# Patient Record
Sex: Male | Born: 1940 | Race: White | Hispanic: No | Marital: Married | Smoking: Former smoker
Health system: Southern US, Community
[De-identification: ages and names within clinical notes are randomized; demographics above are authoritative.]

## PROBLEM LIST (undated history)

## (undated) DIAGNOSIS — R112 Nausea with vomiting, unspecified: Secondary | ICD-10-CM

## (undated) DIAGNOSIS — M5136 Other intervertebral disc degeneration, lumbar region: Secondary | ICD-10-CM

## (undated) DIAGNOSIS — Z87448 Personal history of other diseases of urinary system: Secondary | ICD-10-CM

## (undated) DIAGNOSIS — R29898 Other symptoms and signs involving the musculoskeletal system: Secondary | ICD-10-CM

## (undated) DIAGNOSIS — Z951 Presence of aortocoronary bypass graft: Secondary | ICD-10-CM

## (undated) DIAGNOSIS — M5116 Intervertebral disc disorders with radiculopathy, lumbar region: Secondary | ICD-10-CM

## (undated) DIAGNOSIS — K30 Functional dyspepsia: Secondary | ICD-10-CM

## (undated) DIAGNOSIS — K259 Gastric ulcer, unspecified as acute or chronic, without hemorrhage or perforation: Secondary | ICD-10-CM

## (undated) DIAGNOSIS — J449 Chronic obstructive pulmonary disease, unspecified: Secondary | ICD-10-CM

## (undated) DIAGNOSIS — N400 Enlarged prostate without lower urinary tract symptoms: Secondary | ICD-10-CM

## (undated) DIAGNOSIS — E039 Hypothyroidism, unspecified: Secondary | ICD-10-CM

## (undated) DIAGNOSIS — R7303 Prediabetes: Secondary | ICD-10-CM

## (undated) DIAGNOSIS — N3281 Overactive bladder: Secondary | ICD-10-CM

## (undated) DIAGNOSIS — K089 Disorder of teeth and supporting structures, unspecified: Secondary | ICD-10-CM

## (undated) DIAGNOSIS — I2510 Atherosclerotic heart disease of native coronary artery without angina pectoris: Secondary | ICD-10-CM

## (undated) DIAGNOSIS — G373 Acute transverse myelitis in demyelinating disease of central nervous system: Secondary | ICD-10-CM

## (undated) DIAGNOSIS — E785 Hyperlipidemia, unspecified: Secondary | ICD-10-CM

## (undated) DIAGNOSIS — C801 Malignant (primary) neoplasm, unspecified: Secondary | ICD-10-CM

## (undated) DIAGNOSIS — M5416 Radiculopathy, lumbar region: Secondary | ICD-10-CM

## (undated) DIAGNOSIS — L219 Seborrheic dermatitis, unspecified: Secondary | ICD-10-CM

## (undated) DIAGNOSIS — I10 Essential (primary) hypertension: Secondary | ICD-10-CM

## (undated) DIAGNOSIS — Z9889 Other specified postprocedural states: Secondary | ICD-10-CM

## (undated) DIAGNOSIS — F419 Anxiety disorder, unspecified: Secondary | ICD-10-CM

## (undated) DIAGNOSIS — R339 Retention of urine, unspecified: Secondary | ICD-10-CM

## (undated) DIAGNOSIS — N319 Neuromuscular dysfunction of bladder, unspecified: Secondary | ICD-10-CM

## (undated) DIAGNOSIS — I509 Heart failure, unspecified: Secondary | ICD-10-CM

## (undated) HISTORY — DX: Benign prostatic hyperplasia without lower urinary tract symptoms: N40.0

## (undated) HISTORY — DX: Intervertebral disc disorders with radiculopathy, lumbar region: M51.16

## (undated) HISTORY — DX: Presence of aortocoronary bypass graft: Z95.1

## (undated) HISTORY — DX: Essential (primary) hypertension: I10

## (undated) HISTORY — DX: Atherosclerotic heart disease of native coronary artery without angina pectoris: I25.10

## (undated) HISTORY — DX: Neuromuscular dysfunction of bladder, unspecified: N31.9

## (undated) HISTORY — PX: CARDIAC CATHETERIZATION: SHX172

## (undated) HISTORY — DX: Prediabetes: R73.03

## (undated) HISTORY — DX: Chronic obstructive pulmonary disease, unspecified: J44.9

## (undated) HISTORY — PX: HERNIA REPAIR: SHX51

## (undated) HISTORY — PX: EYE SURGERY: SHX253

## (undated) HISTORY — DX: Hypothyroidism, unspecified: E03.9

## (undated) HISTORY — DX: Other symptoms and signs involving the musculoskeletal system: R29.898

## (undated) HISTORY — DX: Retention of urine, unspecified: R33.9

## (undated) HISTORY — DX: Heart failure, unspecified: I50.9

## (undated) HISTORY — DX: Functional dyspepsia: K30

## (undated) HISTORY — DX: Gastric ulcer, unspecified as acute or chronic, without hemorrhage or perforation: K25.9

## (undated) HISTORY — DX: Other intervertebral disc degeneration, lumbar region: M51.36

## (undated) HISTORY — DX: Acute transverse myelitis in demyelinating disease of central nervous system: G37.3

## (undated) HISTORY — DX: Overactive bladder: N32.81

## (undated) HISTORY — DX: Hyperlipidemia, unspecified: E78.5

## (undated) HISTORY — PX: HAND SURGERY: SHX662

## (undated) HISTORY — DX: Seborrheic dermatitis, unspecified: L21.9

---

## 2002-04-07 DIAGNOSIS — Z951 Presence of aortocoronary bypass graft: Secondary | ICD-10-CM

## 2002-04-07 HISTORY — PX: CORONARY ARTERY BYPASS GRAFT: SHX141

## 2002-04-07 HISTORY — DX: Presence of aortocoronary bypass graft: Z95.1

## 2007-07-02 ENCOUNTER — Ambulatory Visit: Payer: Self-pay | Admitting: Unknown Physician Specialty

## 2009-02-09 ENCOUNTER — Ambulatory Visit: Payer: Self-pay | Admitting: Cardiology

## 2010-05-05 ENCOUNTER — Ambulatory Visit: Payer: Self-pay

## 2010-10-11 ENCOUNTER — Inpatient Hospital Stay: Payer: Self-pay | Admitting: Surgery

## 2011-09-25 ENCOUNTER — Ambulatory Visit: Payer: Self-pay | Admitting: Gastroenterology

## 2011-09-27 LAB — PATHOLOGY REPORT

## 2012-07-02 ENCOUNTER — Other Ambulatory Visit: Payer: Self-pay | Admitting: Gastroenterology

## 2012-11-15 LAB — CBC WITH DIFFERENTIAL/PLATELET
Eosinophil #: 0.1 10*3/uL (ref 0.0–0.7)
HCT: 43.7 % (ref 40.0–52.0)
HGB: 15.3 g/dL (ref 13.0–18.0)
Lymphocyte #: 1.7 10*3/uL (ref 1.0–3.6)
Lymphocyte %: 16.8 %
MCH: 31.2 pg (ref 26.0–34.0)
MCHC: 34.9 g/dL (ref 32.0–36.0)
MCV: 89 fL (ref 80–100)
Monocyte #: 0.8 x10 3/mm (ref 0.2–1.0)
Neutrophil #: 7.4 10*3/uL — ABNORMAL HIGH (ref 1.4–6.5)
Platelet: 166 10*3/uL (ref 150–440)
RBC: 4.89 10*6/uL (ref 4.40–5.90)
RDW: 13.3 % (ref 11.5–14.5)
WBC: 10 10*3/uL (ref 3.8–10.6)

## 2012-11-15 LAB — BASIC METABOLIC PANEL
Anion Gap: 6 — ABNORMAL LOW (ref 7–16)
BUN: 13 mg/dL (ref 7–18)
Calcium, Total: 9 mg/dL (ref 8.5–10.1)
Chloride: 106 mmol/L (ref 98–107)
Co2: 26 mmol/L (ref 21–32)
Creatinine: 0.98 mg/dL (ref 0.60–1.30)
EGFR (African American): >60
Glucose: 122 mg/dL — ABNORMAL HIGH (ref 65–99)
Osmolality: 277 (ref 275–301)
Potassium: 3.6 mmol/L (ref 3.5–5.1)

## 2012-11-16 ENCOUNTER — Ambulatory Visit: Payer: Self-pay | Admitting: Orthopedic Surgery

## 2012-11-16 ENCOUNTER — Inpatient Hospital Stay: Payer: Self-pay | Admitting: Internal Medicine

## 2012-11-16 LAB — BASIC METABOLIC PANEL
Creatinine: 1.03 mg/dL (ref 0.60–1.30)
EGFR (African American): >60
EGFR (Non-African Amer.): >60
Potassium: 3.8 mmol/L (ref 3.5–5.1)
Sodium: 142 mmol/L (ref 136–145)

## 2012-11-18 DIAGNOSIS — I10 Essential (primary) hypertension: Secondary | ICD-10-CM

## 2012-11-18 DIAGNOSIS — N400 Enlarged prostate without lower urinary tract symptoms: Secondary | ICD-10-CM

## 2012-11-18 DIAGNOSIS — E039 Hypothyroidism, unspecified: Secondary | ICD-10-CM | POA: Insufficient documentation

## 2012-11-18 DIAGNOSIS — R339 Retention of urine, unspecified: Secondary | ICD-10-CM

## 2012-11-18 DIAGNOSIS — I2510 Atherosclerotic heart disease of native coronary artery without angina pectoris: Secondary | ICD-10-CM

## 2012-11-18 DIAGNOSIS — R29898 Other symptoms and signs involving the musculoskeletal system: Secondary | ICD-10-CM

## 2012-11-18 HISTORY — DX: Retention of urine, unspecified: R33.9

## 2012-11-18 HISTORY — DX: Other symptoms and signs involving the musculoskeletal system: R29.898

## 2012-11-18 HISTORY — DX: Hypothyroidism, unspecified: E03.9

## 2012-11-18 HISTORY — DX: Essential (primary) hypertension: I10

## 2012-11-18 HISTORY — DX: Benign prostatic hyperplasia without lower urinary tract symptoms: N40.0

## 2012-11-18 HISTORY — DX: Atherosclerotic heart disease of native coronary artery without angina pectoris: I25.10

## 2012-11-21 DIAGNOSIS — G373 Acute transverse myelitis in demyelinating disease of central nervous system: Secondary | ICD-10-CM

## 2012-11-21 HISTORY — DX: Acute transverse myelitis in demyelinating disease of central nervous system: G37.3

## 2013-12-05 ENCOUNTER — Ambulatory Visit: Payer: Self-pay | Admitting: Gastroenterology

## 2013-12-10 LAB — PATHOLOGY REPORT

## 2013-12-19 DIAGNOSIS — N319 Neuromuscular dysfunction of bladder, unspecified: Secondary | ICD-10-CM | POA: Insufficient documentation

## 2013-12-19 HISTORY — DX: Neuromuscular dysfunction of bladder, unspecified: N31.9

## 2014-01-03 DIAGNOSIS — E785 Hyperlipidemia, unspecified: Secondary | ICD-10-CM | POA: Insufficient documentation

## 2014-01-03 DIAGNOSIS — I509 Heart failure, unspecified: Secondary | ICD-10-CM

## 2014-01-03 HISTORY — DX: Hyperlipidemia, unspecified: E78.5

## 2014-01-03 HISTORY — DX: Heart failure, unspecified: I50.9

## 2014-01-12 ENCOUNTER — Ambulatory Visit: Payer: Self-pay | Admitting: Gastroenterology

## 2014-01-19 ENCOUNTER — Ambulatory Visit: Payer: Self-pay | Admitting: Gastroenterology

## 2014-01-20 LAB — PATHOLOGY REPORT

## 2014-03-09 DIAGNOSIS — K219 Gastro-esophageal reflux disease without esophagitis: Secondary | ICD-10-CM | POA: Insufficient documentation

## 2014-03-09 DIAGNOSIS — K259 Gastric ulcer, unspecified as acute or chronic, without hemorrhage or perforation: Secondary | ICD-10-CM

## 2014-03-09 DIAGNOSIS — K30 Functional dyspepsia: Secondary | ICD-10-CM

## 2014-03-09 HISTORY — DX: Functional dyspepsia: K30

## 2014-03-09 HISTORY — DX: Gastric ulcer, unspecified as acute or chronic, without hemorrhage or perforation: K25.9

## 2014-05-25 DIAGNOSIS — R739 Hyperglycemia, unspecified: Secondary | ICD-10-CM | POA: Insufficient documentation

## 2014-05-25 DIAGNOSIS — R7303 Prediabetes: Secondary | ICD-10-CM

## 2014-05-25 HISTORY — DX: Prediabetes: R73.03

## 2014-06-04 DIAGNOSIS — N3281 Overactive bladder: Secondary | ICD-10-CM

## 2014-06-04 HISTORY — DX: Overactive bladder: N32.81

## 2014-06-29 DIAGNOSIS — M5136 Other intervertebral disc degeneration, lumbar region: Secondary | ICD-10-CM

## 2014-06-29 DIAGNOSIS — M5116 Intervertebral disc disorders with radiculopathy, lumbar region: Secondary | ICD-10-CM

## 2014-06-29 DIAGNOSIS — M51369 Other intervertebral disc degeneration, lumbar region without mention of lumbar back pain or lower extremity pain: Secondary | ICD-10-CM

## 2014-06-29 HISTORY — DX: Other intervertebral disc degeneration, lumbar region without mention of lumbar back pain or lower extremity pain: M51.369

## 2014-06-29 HISTORY — DX: Other intervertebral disc degeneration, lumbar region: M51.36

## 2014-06-29 HISTORY — DX: Intervertebral disc disorders with radiculopathy, lumbar region: M51.16

## 2014-07-02 HISTORY — PX: OTHER SURGICAL HISTORY: SHX169

## 2015-01-15 NOTE — Consult Note (Signed)
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   Patient was seen by consultant.   Consult note dictated.   Recommend further assessment or treatment.   Discussed with Attending MD.   Comments: Severe back pain followed by subjective and objective weakness and radicular pain urinary retention requiring catheterization decreased sensation perianal region with mild decrease in rectal tone 4/5 Left tib ant, EHL, gastroc  recommend evaluation by spine surgeon (neurosurgery vs ortho) for further care and possible decompression if warranted  findings and recommendations discussed with attending MD.  Electronic Signatures: Mishael Krysiak, Mali Edward (MD)  (Signed [phone] 13:09)  Authored: Brief Consult Note   Last Updated: 22-Feb-14 13:09 by Kamoni Depree, Mali Edward (MD)

## 2015-01-15 NOTE — Discharge Summary (Signed)
PATIENT NAME:  Joseph Ritter, Joseph Ritter MR#:  [id number] DATE OF BIRTH:  26-Jan-1941  DATE OF ADMISSION:  11/15/2012 DATE OF DISCHARGE:    PRIMARY CARE PHYSICIAN:  Paulita Cradle  FINAL DIAGNOSES: 1.  Severe back pain, herniated disk, urinary retention, rule out cauda equina syndrome.  2.  Hypertension.  3.  Nausea.  4.  Hyperlipidemia.  5.  Hypothyroidism.  6.  Benign prostatic hypertrophy.  MEDICATIONS ON TRANSFER:  Include Dilaudid 0.5 mg IV push every 3 hours as needed for pain, acetaminophen hydrocodone 325/5 one to 2 tablets every 4 hours as needed for pain. I will suspend the patient's aspirin just in case surgical procedure needed. Wellbutrin 100 mg daily, Colace 100 mg b.i.d. as needed for constipation. Lovenox 40 mg subcutaneous injection daily, hydralazine 10 mg IV push q. 4 hours p.r.n. hypertension, Synthroid 0.1 mg daily, morphine 2 to 4 mg IV push q. to 4 hours p.r.n. pain, Zofran 4 mg IV q. 4 hours p.r.n. nausea, vomiting; Senna 1 tablet orally b.i.d. p.r.n. constipation, Zoloft 100 mg p.o. daily, Flomax 0.4 mg daily, Proscar 5 mg daily, Solu-Medrol 40 mg IV push q. 8 hours, Altace 5 mg b.i.d. and that dose was increased and patient will also be put on a sliding scale while on steroids.   HOSPITAL COURSE:  The patient was admitted November 15, 2012.   DATE OF DISCHARGE SUMMARY:  November 16, 2012.  The patient came in with back pain and fall.   HISTORY OF PRESENT ILLNESS:  A 74 year old man who is having lower back pain since Thursday started as pain in both legs, resolved in the afternoon, weakness of the lower extremity. The patient was initially admitted as an observation with low back pain, an MRI was ordered. Over the course of the hospitalization the patient developed urinary retention requiring a Foley catheter.   LABORATORY AND RADIOLOGICAL DATA DURING THE HOSPITAL COURSE: Lumbar spine x-ray: Acute degenerative changes with lower lumbar spine associated with facet joint  changes as well. No evidence of compression fracture. Sedimentation rate 1.  C-reactive protein 3.4. White blood cell count 10, H and H 15.3 and 43.7, platelet count of 166, glucose 122, BUN 13, creatinine 0.98, sodium 138, potassium 3.6, chloride 106, CO2 26.  MRI of the lumbar spine with and without contrast showed at L5-S1 small herniated disk superimposed lateral annular disk bulge produces a mild mass effect on the L5 nerve root, mass effect left S1 nerve root. At L4-L5, mild encroachment on the neural foramina bilaterally. No compression fracture. The patient was seen in consultation by Dr. Mali Smith, orthopedic. He recommended evaluation by spine surgery at a tertiary care center since we do not have a neurosurgeon or spine surgeon here at this facility. The patient had severe back pain, urinary retention, weakness, decreased rectal tone. He recommended transfer for possible cauda equina syndrome. The patient was started on empirically IV steroids by me this morning, February 22. The patient is currently able to straight leg raise and has 1+ reflexes bilateral lower extremity.  THE PATIENT'S VITALS UPON TRANSFER: Temperature 98, pulse ranging between 61 and 104, respirations 20, blood pressure 144/76, pulse ox of 94% at rest. The patient will be transferred to [facility] on the medicine service under Dr. Lucianne Lei, accepting physician. I did speak with Dr. Owens Shark, a spinal specialist from orthopedics, who will see the patient in consultation and render his opinion.  TIME SPENT ON PATIENT CARE TODAY:  55 minutes    ____________________________ Tana Conch. [place],  MD rjw:ce D: 11/16/2012 14:04:28 ET T: 11/16/2012 14:45:38 ET JOB#: [id number]  cc: Tana Conch. Leslye Peer, MD, <Dictator>  Paulita Cradle, MD Marisue Brooklyn MD ELECTRONICALLY SIGNED 11/21/2012 15:40

## 2015-01-15 NOTE — Consult Note (Signed)
PATIENT NAME:  Joseph Ritter, Joseph Ritter MR#:  [id number] DATE OF BIRTH:  [birth year]  DATE OF CONSULTATION:  11/16/2012  CONSULTING PHYSICIAN:  Mali E. Iam Lipson, MD  REASON FOR CONSULTATION: Low back pain with urinary retention.   HISTORY OF PRESENT ILLNESS: This is a 74 year old male who had onset of severe back pain, 2 days prior to consultation, in his home after twisting to get up out of a chair.  The pain continued for the next few hours and then began to radiate into the bilateral lower extremities. When the patient awoke the next morning, he had continued pain in the lower extremities as well as some weakness. He had difficulty ambulating.  It was at that point that the patient was brought to the Emergency Room for further evaluation. He was admitted for pain control and close monitoring. He continued to have difficulty with ambulation even with the assistance of physical therapy. Also, during that time and over the course of the entire day, the patient was unable to void. He had some mild incontinence during ambulation with Physical Therapy.  A bladder scan revealed a completely full and distended bladder.  It was at that point that a Foley catheter was placed. The patient underwent an MRI of his lumbar spine. The following morning, the patient's back pain had improved somewhat, although he still had some difficulty with ambulation.  It was at that point that orthopedic consultation was requested. At the time of consultation, the patient's back pain was rated as only a 3 out of 10, sharp in nature, exacerbated by flexion and extension and relieved by rest. The radicular pain had also subsided somewhat with pain medication. However, he continued to have some subjective weakness. The patient had not had a bowel movement for the last 2 days. The patient denied any fevers or chills.   PAST MEDICAL HISTORY: Hypertension, coronary artery disease, status post CABG, diabetes.   PAST SURGICAL HISTORY: CABG.    MEDICATIONS:  Zofran, Vytorin, ramipril, probiotic, Percocet, Protonix.   ALLERGIES: No known drug allergies.   SOCIAL HISTORY: The patient denies any alcohol or tobacco use.   FAMILY HISTORY: Noncontributory.   REVIEW OF SYSTEMS: No chest pain, no shortness of breath.   PHYSICAL EXAMINATION:  GENERAL:  The patient is well-appearing, well-nourished, in no acute distress.  BACK: Mild tenderness about the paraspinal musculature of the lumbar spine, no midline  tenderness. Decreased range of motion on flexion-extension secondary to pain. No erythema.  EXTREMITIES: Examination of the bilateral lower extremities reveals overlying skin to be intact without erythema. He has full range of motion of the hip, knees and ankles.  No instability of the hip, knee or ankle. He has 5 out of 5 strength in all key muscle groups of the right lower extremity; however, he has 4 out of 5 strength on testing of tibialis anterior, EHL and gastrocs. He has intact sensation in all dermatomes, although slightly diminished on the left side along L5 and S1. He also has some decreased perianal sensation. The patient has good distal pulses, negative calf tenderness.  RECTAL: Mildly decreased rectal tone.   IMAGING: MRI of the lumbar spine was obtained and reviewed which reveals evidence of multilevel disk degeneration with disk prolapse and effacement of the thecal sac at multiple levels.   IMPRESSION: A 74 year old male with multilevel disk degeneration, urinary retention, decreased perianal sensation and mildly decreased rectal tone worrisome for cauda equina syndrome.   PLAN: Due to the radiographic and clinical findings, it  was recommended that the patient be transferred for further evaluation by a spine surgeon at a tertiary care center, with that being [place], [place] or [place]. The patient does have a past CABG performed at [facility]. Recommendation for transfer would be for evaluation of possible emergent  decompression if warranted by the spine surgeon at the accepting institution. These findings and recommendations were discussed with the attending physician, and a transfer will be arranged.   ____________________________ Mali E. Stephen Turnbaugh, MD ces:cb D: 11/17/2012 11:27:41 ET T: 11/17/2012 14:59:09 ET JOB#: [id number]  cc: Mali E. Panayiota Larkin, MD, <Dictator> Mali E Sandie Swayze MD ELECTRONICALLY SIGNED 11/17/2012 21:04

## 2015-01-15 NOTE — H&P (Signed)
PATIENT NAME:  Joseph Ritter, Joseph Ritter MR#:  [id number] DATE OF BIRTH:  [birth year]  DATE OF ADMISSION:  11/15/2012  PRIMARY CARE PHYSICIAN: Paulita Cradle, PA-C, at [facility].  CHIEF COMPLAINT: Back pain and fall.  HISTORY OF PRESENT ILLNESS: Mr. Cuff is a 74 year old well-functional male at baseline, who started to experience pain in the lower back for about 3 days. Initially, it started as pain in both legs; however, it was resolved by the afternoon. The following day, started to have somewhat weakness in the lower extremities. Last night around dinnertime, laying down the wheelchair, and turned around and started to have increased pain and swelling. The patient denies having any chronic pain; however, experienced a similar pain about 25 years back. At that time, workup revealed the patient had degenerative joint disease. No surgical intervention was offered at the time. Last night around 10:00 p.m., the patient woke up from the bed, stood up, turned to the right and felt that he could not stand up. In the emergency department, the patient received multiple doses of Dilaudid and diazepam. Attempted to ambulate the patient in the hallways, which was unsuccessful considering the patient's severe generalized weakness in both lower extremities. In the emergency department, x-ray of the lumbar spine was done which was unremarkable.   PAST MEDICAL HISTORY:  1. Hypertension. 2. Coronary artery disease status post CABG, follows up with Dr. Miquel Dunn.  3. Diabetes mellitus, on oral medication.  ALLERGIES: Deny having any known drug allergies.  HOME MEDICATIONS: 1. Zofran 4 mg every 8 hours as needed. 2. Vytorin 10/40 mg once a day. 3.  mg once a day. 4. Ramipril 5 mg daily. 5. Probiotic 1 capsule once a day. 6. Percocet 1 to 2 tablets every 4 hours. 7. Protonix 40 mg daily.  SOCIAL HISTORY: Previous history of smoking and drinking alcohol, quit about 25 years back. Denies using any  illicit drugs.   FAMILY HISTORY: History of hypertension, hyperlipidemia.  REVIEW OF SYSTEMS: CONSTITUTIONAL: No fever.  EYES: Has no change in vision. ENT: Denies having any ringing in the ears. RESPIRATORY: Denies having any cough, shortness of breath. CARDIOVASCULAR: Denies having any chest pain, palpitations. GASTROINTESTINAL: As mentioned above, the patient has been nauseated since the patient has been given pain medications. GENITOURINARY: Denies having any dysuria. NEUROLOGIC: The patient has no weakness or tingling sensation.  PHYSICAL EXAMINATION:  GENERAL: This is a well-built, well-nourished, age-appropriate male lying down in the bed, not in distress.   VITAL SIGNS: Temperature 97.3, pulse 75, blood pressure 180/76, respiratory rate of 20, oxygen saturation is 97% on room air. HEENT: Head: Normocephalic and atraumatic. Eyes: No scleral icterus. Conjunctivae normal. Pupils equal and reactive to light. Mucous membranes are moist.  NECK: Supple. No lymphadenopathy. No JVD. No carotid bruits.  CHEST: Has no focal tenderness.  LUNGS: Bilaterally clear to auscultation. HEART: S1 and S2, regular. No murmurs are heard.  ABDOMEN: Bowel sounds present. Soft, nontender, nondistended. EXTREMITIES: No pedal edema. Pulses 2+.  NEUROLOGIC: The patient is alert and oriented to place, person and time. No apparent cranial nerve abnormalities. Is 5/5 in upper and lower extremities. Reflexes equal on both sides.    ASSESSMENT AND PLAN: Having lower extremity weakness and low back pain.   1. Low back pain. Could not really pinpoint the pain. The patient states somewhat in the lumbar and both sacral areas. Will obtain MRI of the brain. Differential diagnosis: Muscle sprain versus infectious process. Will also obtain CRP and sed rate.  2.  Hypertension, moderately controlled. Blood pressure is 180/76. Will continue the home medications and will give labetalol as needed. 3. Lower extremity weakness.  Will keep the patient at bedrest for now. Will obtain MRI of the brain. Will also obtain physical therapy and occupational therapy. 4. Nausea, most likely secondary to the narcotic medication. Will continue with antinausea medications. 5. Keep the patient on deep venous thrombosis prophylaxis with Lovenox.  TIME SPENT TAKING CARE OF THIS PATIENT: 50 minutes.  ____________________________ Monica Becton, MD pv:OSi D: 11/15/2012 04:42:00 ET T: 11/15/2012 07:44:16 ET JOB#: [id number]  cc: Monica Becton, MD, <Dictator>    Monica Becton MD ELECTRONICALLY SIGNED 11/17/2012 7:16

## 2015-05-03 ENCOUNTER — Ambulatory Visit (INDEPENDENT_AMBULATORY_CARE_PROVIDER_SITE_OTHER): Payer: Medicare Other | Admitting: Urology

## 2015-05-03 VITALS — BP 179/74 | HR 68 | Ht 66.0 in | Wt 180.4 lb

## 2015-05-03 DIAGNOSIS — N400 Enlarged prostate without lower urinary tract symptoms: Secondary | ICD-10-CM

## 2015-05-03 DIAGNOSIS — R339 Retention of urine, unspecified: Secondary | ICD-10-CM | POA: Diagnosis not present

## 2015-05-03 DIAGNOSIS — J449 Chronic obstructive pulmonary disease, unspecified: Secondary | ICD-10-CM

## 2015-05-03 DIAGNOSIS — N319 Neuromuscular dysfunction of bladder, unspecified: Secondary | ICD-10-CM

## 2015-05-03 DIAGNOSIS — F32A Depression, unspecified: Secondary | ICD-10-CM | POA: Insufficient documentation

## 2015-05-03 DIAGNOSIS — L219 Seborrheic dermatitis, unspecified: Secondary | ICD-10-CM | POA: Insufficient documentation

## 2015-05-03 DIAGNOSIS — F329 Major depressive disorder, single episode, unspecified: Secondary | ICD-10-CM | POA: Insufficient documentation

## 2015-05-03 HISTORY — DX: Seborrheic dermatitis, unspecified: L21.9

## 2015-05-03 HISTORY — DX: Chronic obstructive pulmonary disease, unspecified: J44.9

## 2015-05-03 LAB — MICROSCOPIC EXAMINATION

## 2015-05-03 LAB — URINALYSIS, COMPLETE
Bilirubin, UA: NEGATIVE
GLUCOSE, UA: NEGATIVE
Ketones, UA: NEGATIVE
Nitrite, UA: POSITIVE — AB
Protein, UA: NEGATIVE
Specific Gravity, UA: 1.02 (ref 1.005–1.030)
Urobilinogen, Ur: 0.2 mg/dL (ref 0.2–1.0)
pH, UA: 5.5 (ref 5.0–7.5)

## 2015-05-03 NOTE — Assessment & Plan Note (Signed)
He has a neurogenic bladder with incomplete emptying from BPH and Botox.   His urine looks colonized.  Urine culture today. I will get a urine culture to monitor his upper tracts. I have given him samples of vesicare 5mg  po daily #21 to try and reviewed the side effects. He will return in 3 weeks for reevaluation. No treatment for infection unless he becomes symptomatic or decides to have repeat botox.

## 2015-05-03 NOTE — Progress Notes (Signed)
05/03/2015 11:06 AM   Joseph Ritter 12/04/1940 [id number]  Referring provider: [name], MD [place], [place] [address]  Chief Complaint  Patient presents with  . Neurogenic Bladder    New Patient    HPI: [name] is a 74 yo WM with a history of a neurogenic bladder following transverse myelitis about 3 years ago. He is has some urgency and can void small amounts but he uses CIC to complete emptying.  He has had prior botox in October of 2015.  He had a reduction in the urgency initially but it has started to wear off.  He has nocturia x 2 with urgency.   He has frequency q1-2hrs.  He has no dysuria but his urine looks colonized today.  He has no other GU history.  He is on tamsulosin but failed Myrbetriq.  He doesn't think he has been on any anticholinergics.   PMH: Past Medical History  Diagnosis Date  . CCF (congestive cardiac failure) 01/03/2014    Overview:  HX OF   . Arteriosclerosis of coronary artery 11/18/2012    Overview:  PATENT LIMA TO LAD, PATENT SVG TO PDA AND OCCULUDED SVG TO OM2 BY CATHERIZATION 02/09/2009   . BP (high blood pressure) 11/18/2012  . CAFL (chronic airflow limitation) 05/03/2015  . Gastric ulcer 03/09/2014  . Adult hypothyroidism 11/18/2012  . Borderline diabetes 05/25/2014  . Leg weakness 11/18/2012  . Neuritis or radiculitis due to rupture of lumbar intervertebral disc 06/29/2014  . 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 DDD (degenerative disc disease), lumbar 06/29/2014  . 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 Clinical depression 05/03/2015  . Acid indigestion 03/09/2014  . HLD (hyperlipidemia) 01/03/2014  . Incomplete bladder emptying 11/18/2012  . 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 H/O coronary artery bypass surgery 04/07/2002    Overview:  CABG X3 WITH LIMA TO LAD, SVG OM1 AND PDA     Surgical History: Past Surgical History  Procedure  Laterality Date  . Eye surgery    . Hand surgery    . Coronary artery bypass graft  04/07/2002  . Cysto bladder botox injection  07/02/2014    Home Medications:    Medication List       This list is accurate as of: 05/03/15 11:06 AM.  Always use your most recent med list.               acetaminophen 500 MG tablet  Commonly known as:  TYLENOL  Take by mouth.     aspirin EC 81 MG tablet  Take by mouth.     buPROPion 100 MG 12 hr tablet  Commonly known as:  WELLBUTRIN SR  TAKE ONE TABLET BY MOUTH ONCE A DAY     CALCIUM CITRATE + D 250-200 MG-UNIT Tabs  Generic drug:  Calcium Citrate-Vitamin D  Take by mouth.     CENTRUM SILVER tablet  Take by mouth.     ketoconazole 2 % shampoo  Commonly known as:  NIZORAL  APPLY SHAMPOO TO HAIR AS DIRECTED TWICE A WEEK     levothyroxine 100 MCG tablet  Commonly known as:  SYNTHROID, LEVOTHROID  1 TABLET BY MOUTH EVERY MORNING FOR THYROID     nitroGLYCERIN 0.4 MG SL tablet  Commonly known as:  NITROSTAT  Place under the tongue.     polyethylene glycol powder powder  Commonly known as:  GLYCOLAX/MIRALAX  Take by  mouth.     RABEprazole 20 MG tablet  Commonly known as:  ACIPHEX  Take by mouth.     ramipril 10 MG capsule  Commonly known as:  ALTACE  TAKE ONE CAPSULE BY MOUTH EVERY DAY     senna-docusate 8.6-50 MG per tablet  Commonly known as:  Senokot-S  Take by mouth.     sertraline 100 MG tablet  Commonly known as:  ZOLOFT  TAKE 1 TABLET BY MOUTH ONCE A DAY     sucralfate 1 G tablet  Commonly known as:  CARAFATE  TAKE 1 TABLET BY MOUTH TWICE A DAY     tamsulosin 0.4 MG Caps capsule  Commonly known as:  FLOMAX  Take by mouth.     traMADol 50 MG tablet  Commonly known as:  ULTRAM  1 qid prn     VYTORIN 10-40 MG per tablet  Generic drug:  ezetimibe-simvastatin  TAKE 1 TABLET BY MOUTH EVERY DAY AT BEDTIME        Allergies: No Known Allergies  Family History: Family History  Problem Relation Age of Onset    . Prostate cancer Neg Hx   . Bladder Cancer Neg Hx   . Kidney cancer Neg Hx   . Heart attack Mother   . Heart disease Father     Social History:  reports that he has quit smoking. He does not have any smokeless tobacco history on file. He reports that he does not drink alcohol or use illicit drugs.  ROS: UROLOGY Frequent Urination?: Yes Hard to postpone urination?: Yes Burning/pain with urination?: No Get up at night to urinate?: Yes Leakage of urine?: Yes Urine stream starts and stops?: Yes Trouble starting stream?: Yes Do you have to strain to urinate?: Yes Blood in urine?: No Urinary tract infection?: No Sexually transmitted disease?: No Injury to kidneys or bladder?: No Painful intercourse?: No Weak stream?: Yes Erection problems?: No Penile pain?: No  Gastrointestinal Nausea?: No Vomiting?: No Indigestion/heartburn?: No Diarrhea?: No Constipation?: No  Constitutional Fever: No Night sweats?: No Weight loss?: No Fatigue?: Yes  Skin Skin rash/lesions?: Yes Itching?: No  Eyes Blurred vision?: No Double vision?: No  Ears/Nose/Throat Sore throat?: No Sinus problems?: No  Hematologic/Lymphatic Swollen glands?: No Easy bruising?: No  Cardiovascular Leg swelling?: No Chest pain?: No  Respiratory Cough?: No Shortness of breath?: Yes  Endocrine Excessive thirst?: No  Musculoskeletal Back pain?: Yes Joint pain?: Yes  Neurological Headaches?: No Dizziness?: No  Psychologic Depression?: Yes Anxiety?: Yes  Physical Exam: BP 179/74 mmHg  Pulse 68  Ht 5\' 6"  (1.676 m)  Wt 180 lb 6.4 oz (81.829 kg)  BMI 29.13 kg/m2  Physical Exam  Constitutional: He appears well-developed and well-nourished. No distress.  Cardiovascular: Normal rate, regular rhythm and normal heart sounds.   Pulmonary/Chest: Effort normal and breath sounds normal.  Abdominal: Soft. He exhibits no mass. There is no tenderness.     Laboratory Data: Lab Results   Component Value Date   WBC 10.0 11/15/2012   HGB 15.3 11/15/2012   HCT 43.7 11/15/2012   MCV 89 11/15/2012   PLT 166 11/15/2012    Lab Results  Component Value Date   CREATININE 1.03 11/16/2012    No results found for: PSA  No results found for: TESTOSTERONE  No results found for: HGBA1C  Urinalysis He has >30 WBC, 3-10 RBC and many bacteria.  Pertinent Imaging: None but I have reviewed records from [place].    Assessment & Plan:  Problem List Items Addressed This Visit    Benign fibroma of prostate   Relevant Medications   tamsulosin (FLOMAX) 0.4 MG CAPS capsule   Other Relevant Orders   US Renal Limited   Incomplete bladder emptying    He has a neurogenic bladder with incomplete emptying from BPH and Botox.   His urine looks colonized.  Urine culture today. I will get a urine culture to monitor his upper tracts. I have given him samples of vesicare 5mg  po daily #21 to try and reviewed the side effects. He will return in 3 weeks for reevaluation. No treatment for infection unless he becomes symptomatic or decides to have repeat botox.       Relevant Orders   US Renal Limited   Bladder neurogenesis - Primary    See note with incomplete emptying.      Relevant Orders   Urinalysis, Complete   CULTURE, URINE COMPREHENSIVE   US Renal Limited      Return in about 3 weeks (around 05/24/2015).  Malka So, MD  [facility] [address] [place], [place] [address] [phone]

## 2015-05-03 NOTE — Assessment & Plan Note (Signed)
See note with incomplete emptying.

## 2015-05-05 ENCOUNTER — Other Ambulatory Visit: Payer: Self-pay

## 2015-05-05 DIAGNOSIS — N319 Neuromuscular dysfunction of bladder, unspecified: Secondary | ICD-10-CM

## 2015-05-05 DIAGNOSIS — R339 Retention of urine, unspecified: Secondary | ICD-10-CM

## 2015-05-05 LAB — CULTURE, URINE COMPREHENSIVE

## 2015-05-06 ENCOUNTER — Ambulatory Visit
Admission: RE | Admit: 2015-05-06 | Discharge: 2015-05-06 | Disposition: A | Discharge status: Home / Self Care | Payer: Medicare Other | Source: Ambulatory Visit | Attending: Urology | Admitting: Urology

## 2015-05-06 ENCOUNTER — Telehealth: Payer: Self-pay

## 2015-05-06 ENCOUNTER — Encounter: Payer: Self-pay | Admitting: Urology

## 2015-05-06 DIAGNOSIS — N319 Neuromuscular dysfunction of bladder, unspecified: Secondary | ICD-10-CM | POA: Diagnosis present

## 2015-05-06 NOTE — Telephone Encounter (Signed)
This is the pt that was sent for a urine cx. It is positive. Please advise.

## 2015-05-07 ENCOUNTER — Telehealth: Payer: Self-pay

## 2015-05-07 NOTE — Telephone Encounter (Signed)
Spoke with pt who stated at this time is not having UTI symptoms. Pt also stated he has not decided if he would like to have botox or not. Pt stated he will call office when he makes a decision.

## 2015-05-07 NOTE — Telephone Encounter (Signed)
Spoke with pt wife and made aware of u/s results. Wife voiced understanding.

## 2015-05-07 NOTE — Telephone Encounter (Signed)
-----   Message from Irine Seal, MD sent at 05/06/2015  8:03 PM EDT ----- His culture is positive but he was asymptomatic and does self cath so no treatment needed unless he becomes symptomatic or decides to have botox.

## 2015-05-07 NOTE — Telephone Encounter (Signed)
-----   Message from Hollice Espy, MD sent at 05/06/2015  4:20 PM EDT ----- Please let patient know ultrasound is fine.  We will see him in follow up as scheduled.  Hollice Espy, MD

## 2015-05-17 ENCOUNTER — Ambulatory Visit (INDEPENDENT_AMBULATORY_CARE_PROVIDER_SITE_OTHER): Payer: Medicare Other | Admitting: Urology

## 2015-05-17 VITALS — BP 162/72 | HR 85 | Ht 67.0 in | Wt 182.8 lb

## 2015-05-17 DIAGNOSIS — N312 Flaccid neuropathic bladder, not elsewhere classified: Secondary | ICD-10-CM

## 2015-05-17 DIAGNOSIS — N319 Neuromuscular dysfunction of bladder, unspecified: Secondary | ICD-10-CM

## 2015-05-17 DIAGNOSIS — N400 Enlarged prostate without lower urinary tract symptoms: Secondary | ICD-10-CM

## 2015-05-17 LAB — URINALYSIS, COMPLETE
Bilirubin, UA: NEGATIVE
Glucose, UA: NEGATIVE
Nitrite, UA: POSITIVE — AB
Protein, UA: NEGATIVE
SPEC GRAV UA: 1.02 (ref 1.005–1.030)
Urobilinogen, Ur: 0.2 mg/dL (ref 0.2–1.0)
pH, UA: 6 (ref 5.0–7.5)

## 2015-05-17 LAB — MICROSCOPIC EXAMINATION
EPITHELIAL CELLS (NON RENAL): NONE SEEN /HPF (ref 0–10)
RBC, UA: NONE SEEN /hpf (ref 0–?)

## 2015-05-17 NOTE — Progress Notes (Signed)
[name] is a 74 yo WM with a history of a neurogenic bladder following transverse myelitis about 3 years ago. He was seen recently with complaints of urinary urgency and small volume voids. He uses CIC to completely empty. He has had prior botox in October of 2015. He had a reduction in the urgency initially but it has started to wear off. He has nocturia x 2 with urgency.He has frequency q1-2hrs. He has no other GU history. He is on tamsulosin but failed Myrbetriq. ------------------------------------ Prior GU note from Joseph Ritter: h/o  of BPH, transverse myelitis with neurogenic bladder. He underwent botox injection Sept 2015. He did not feel this made a significant improvement in his symptoms. He does not wish to be scheduled for repeat injections. Voiding 6-8 times per day and 2 times per night. He typically catheterizes after every daytime void. He typically voids 1/4 and catheterizes for 3/4 of the volume. He catheterizes with a 16 french speedi-cath. He has rare incontinence. He does not routinely wear pads. No UTI symptoms.   UDS 12/24/11: This study revealed neurogenic detrusor over-activity due to phasic uninhibited contractions of 17 to 34 cmH2O pressure during filling. Motor urge incontinence was threatened but not demonstrated. The study showed the bladder has normal compliance and normal cystometric capacity of 240-270 mls. The volume was limited due to the bladder over-activity. Stress type incontinence was NOT demonstrated during provocation by coughing or straining. The cystogram showed a normal shaped bladder with no reflux. Voiding was by volition with underlying abdominal straining . His flow was slow and weak but did appear to continue for a short period when not straining. Peak flow was 8 mls/sec at 23 cmH2O pressure and he voided 136 mls with 155-160 mls residual. These pressure/flow measurements are not indicative of outflow obstruction (Grade 0 to 1 out of 6 on a Schafer nomogram).  Fluoroscopy showed the bladder neck and prostatic fossa funneled open during voiding and the distal tract opened adequately. The patient has tried mirabegron and flomax but still voids frequently. He would like to try botox. Today we injected 100 units of botox in 20cc of NS at 20 different sites. He tolerated the procedure well.  06/04/14 Plan: 1. We will see you back in 4 weeks to follow up on your results 2. Fill out a voiding diary prior to follow up appointment.  3. Continue to catheterize yourself twice a day until volumes that you get are low, then can stop 4. We will give you a prescription for catheters to continue CIC  07/02/14 clinic visit and interval history: Patient is status post cystourethroscopy and injection of 100 units of Botox on 06/04/14 . He presents for follow-up. He denies fevers chills nausea vomiting. Patient has had improvement in his urinary urgency and frequency. Based on his diary for this clean intermittent catheterization additional logs the patient is able to go up to 12 hr in between voids. He also noted improvement in his nocturnal voids from 3 voids at night to zero to one void at night The patient does have a residual between 300-400 mL on average but his urine ever studies preoperatively demonstrated a bladder approximately that contractility index of less than 100. He denies any difficulties with catheterization. He does request exchange of his 10 French catheters 60 [place] which he has used previously with better success.  07/02/14 Plan 1. Continue clean intermittent catheterization t.i.d after voids. A new prescription was given to the patient For new 16 French urethral catheters.  The patient was informed to send the 79 French catheters back to the medical supply company -----------------------------------------------------------  Today, he returns after starting Vesicare 5 mg. He continues to have issues with urinary frequency and urgency. He has nocturia. He  voids small amounts and then does CIC. He does CIC 8 - 10 x per day due to urgency. He gets a good amount when he does CIC. Incontinence is minimal. No pad use unless he's going out for an extended period.   Renal bladder u/s showed no hydronephrosis and a normal bladder volume.   Review of systems positive for back pain, depression and anxiety otherwise negative. 14 systems review of systems obtained. He denies alcohol or tobacco use. Denies recreational drug use.  His urinalysis shows many bacteria but clinically is not infected.  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 Pt wants to hold off. Discussed increasing Vesicare vs. Botox. Discussed dry mouth and constipation and he elected trial of Vesicare 10 mg. Cont CIC.  -AB - Urine not cultured as prior culture was done (e coli)

## 2015-06-01 ENCOUNTER — Telehealth: Payer: Self-pay

## 2015-06-01 NOTE — Telephone Encounter (Signed)
Patient will run out of the vesicare samples he was given and needs a script or more samples before his appt on 06-29-15   Please advise  Thanks, Sharyn Lull

## 2015-06-03 NOTE — Telephone Encounter (Signed)
Samples were left at the front.

## 2015-06-03 NOTE — Telephone Encounter (Signed)
No answer

## 2015-06-29 ENCOUNTER — Ambulatory Visit: Payer: Medicare Other

## 2015-07-13 ENCOUNTER — Encounter: Payer: Self-pay | Admitting: Urology

## 2015-07-13 ENCOUNTER — Ambulatory Visit (INDEPENDENT_AMBULATORY_CARE_PROVIDER_SITE_OTHER): Payer: Medicare Other | Admitting: Urology

## 2015-07-13 VITALS — BP 196/84 | HR 76 | Ht 66.0 in | Wt 182.8 lb

## 2015-07-13 DIAGNOSIS — N319 Neuromuscular dysfunction of bladder, unspecified: Secondary | ICD-10-CM

## 2015-07-13 LAB — URINALYSIS, COMPLETE
BILIRUBIN UA: NEGATIVE
GLUCOSE, UA: NEGATIVE
KETONES UA: NEGATIVE
Leukocytes, UA: NEGATIVE
NITRITE UA: NEGATIVE
Protein, UA: NEGATIVE
RBC, UA: NEGATIVE
SPEC GRAV UA: 1.01 (ref 1.005–1.030)
UUROB: 1 mg/dL (ref 0.2–1.0)
pH, UA: 7 (ref 5.0–7.5)

## 2015-07-13 LAB — MICROSCOPIC EXAMINATION
Bacteria, UA: NONE SEEN
Epithelial Cells (non renal): NONE SEEN /hpf (ref 0–10)
RBC, UA: NONE SEEN /hpf (ref 0–?)
WBC UA: NONE SEEN /HPF (ref 0–?)

## 2015-07-13 NOTE — Progress Notes (Signed)
07/13/2015 2:50 PM   Joseph Ritter 15-Feb-1941 [id number]  Referring provider: [name], MD [facility] [place] [place] [address]  Chief Complaint  Patient presents with  . Neurogenic Bladder    HPI: 1 - Neurogenic Bladder with Detrussor Hyperreflexia with Impaired Conractility - neurogenic bladder 2015 after transverse myeliitis. [facility]DS [facility] with small contractions but ineficient emptying, PVR's >47ml. Manages with self cath about QID. Tried ofice BTX 100 IH and oral anticholinergics w/o help for urgency component which is minimal bother. No incontinence.  Recent Surveaillance: 2016 - [place] no hydro, Cr <1.0.   Today "Ray" is seen in f/u above. No interval febrile UTI or incontiennce.    PMH: Past Medical History  Diagnosis Date  . CCF (congestive cardiac failure) ([place]) 01/03/2014    Overview:  HX OF   . Arteriosclerosis of coronary artery 11/18/2012    Overview:  PATENT LIMA TO LAD, PATENT SVG TO PDA AND OCCULUDED SVG TO OM2 BY CATHERIZATION 02/09/2009   . BP (high blood pressure) 11/18/2012  . CAFL (chronic airflow limitation) ([place]) 05/03/2015  . Gastric ulcer 03/09/2014  . Adult hypothyroidism 11/18/2012  . Borderline diabetes 05/25/2014  . Leg weakness 11/18/2012  . Neuritis or radiculitis due to rupture of lumbar intervertebral disc 06/29/2014  . Subacute transverse myelitis ([place]) 11/21/2012  . DDD (degenerative disc disease), lumbar 06/29/2014  . 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 Clinical depression 05/03/2015  . Acid indigestion 03/09/2014  . HLD (hyperlipidemia) 01/03/2014  . Incomplete bladder emptying 11/18/2012  . 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 Eye  surgery    . Hand surgery    . Coronary artery bypass graft  04/07/2002  . Cysto bladder botox injection  07/02/2014    Home Medications:    Medication List       This list is accurate as of: 07/13/15  2:50 PM.  Always use your most recent med list.               acetaminophen 500 MG tablet  Commonly known as:  TYLENOL  Take by mouth.     aspirin EC 81 MG tablet  Take by mouth.     buPROPion 100 MG 12 hr tablet  Commonly known as:  WELLBUTRIN SR  TAKE ONE TABLET BY MOUTH ONCE A DAY     CALCIUM CITRATE + D 250-200 MG-UNIT Tabs  Generic drug:  Calcium Citrate-Vitamin D  Take by mouth.     CENTRUM SILVER tablet  Take by mouth.     cyclobenzaprine 10 MG tablet  Commonly known as:  FLEXERIL  TAKE 1/2 TO 1 TABLET BY MOUTH NIGHTLY     ketoconazole 2 % shampoo  Commonly known as:  NIZORAL  APPLY SHAMPOO TO HAIR AS DIRECTED TWICE A WEEK     levothyroxine 100 MCG tablet  Commonly known as:  SYNTHROID, LEVOTHROID  1 TABLET BY MOUTH EVERY MORNING FOR THYROID     nitroGLYCERIN 0.4 MG SL tablet  Commonly known as:  NITROSTAT  Place under the tongue.     polyethylene glycol powder powder  Commonly known as:  GLYCOLAX/MIRALAX  Take by mouth.     RABEprazole 20  MG tablet  Commonly known as:  ACIPHEX  Take by mouth.     ramipril 10 MG capsule  Commonly known as:  ALTACE  TAKE ONE CAPSULE BY MOUTH EVERY DAY     senna-docusate 8.6-50 MG tablet  Commonly known as:  Senokot-S  Take by mouth.     sertraline 100 MG tablet  Commonly known as:  ZOLOFT  TAKE 1 TABLET BY MOUTH ONCE A DAY     solifenacin 5 MG tablet  Commonly known as:  VESICARE  Take 5 mg by mouth daily.     sucralfate 1 G tablet  Commonly known as:  CARAFATE  TAKE 1 TABLET BY MOUTH TWICE A DAY     tamsulosin 0.4 MG Caps capsule  Commonly known as:  FLOMAX  Take by mouth.     traMADol 50 MG tablet  Commonly known as:  ULTRAM  1 qid prn     VYTORIN 10-40 MG tablet  Generic drug:   ezetimibe-simvastatin  TAKE 1 TABLET BY MOUTH EVERY DAY AT BEDTIME        Allergies: No Known Allergies  Family History: Family History  Problem Relation Age of Onset  . Prostate cancer Neg Hx   . Bladder Cancer Neg Hx   . Kidney cancer Neg Hx   . Heart attack Mother   . Heart disease Father     Social History:  reports that he has quit smoking. He does not have any smokeless tobacco history on file. He reports that he does not drink alcohol or use illicit drugs.  ROS: UROLOGY Frequent Urination?: Yes Hard to postpone urination?: Yes Burning/pain with urination?: No Get up at night to urinate?: Yes Leakage of urine?: No Urine stream starts and stops?: No Trouble starting stream?: No Do you have to strain to urinate?: Yes Blood in urine?: No Urinary tract infection?: No Sexually transmitted disease?: No Injury to kidneys or bladder?: No Painful intercourse?: No Weak stream?: Yes Erection problems?: No Penile pain?: No  Gastrointestinal Nausea?: No Vomiting?: No Indigestion/heartburn?: No Diarrhea?: No Constipation?: No  Constitutional Fever: No Night sweats?: No Weight loss?: No Fatigue?: No  Skin Skin rash/lesions?: No Itching?: No  Eyes Blurred vision?: No Double vision?: No  Ears/Nose/Throat Sore throat?: No Sinus problems?: No  Hematologic/Lymphatic Swollen glands?: No Easy bruising?: No  Cardiovascular Leg swelling?: No Chest pain?: No  Respiratory Cough?: No Shortness of breath?: No  Endocrine Excessive thirst?: No  Musculoskeletal Back pain?: Yes Joint pain?: No  Neurological Headaches?: No Dizziness?: No  Psychologic Depression?: No Anxiety?: No  Physical Exam: BP 196/84 mmHg  Pulse 76  Ht 5\' 6"  (1.676 m)  Wt 182 lb 12.8 oz (82.918 kg)  BMI 29.52 kg/m2  Constitutional:  Alert and oriented, No acute distress. HEENT: [place] AT, moist mucus membranes.  Trachea midline, no masses. Cardiovascular: No clubbing, cyanosis,  or edema. Respiratory: Normal respiratory effort, no increased work of breathing. GI: Abdomen is soft, nontender, nondistended, no abdominal masses GU: No CVA tenderness. Phallus circumcised, straight. NO testes masses.  Skin: No rashes, bruises or suspicious lesions. Lymph: No cervical or inguinal adenopathy. Neurologic: Grossly intact, no focal deficits, moving all 4 extremities. Psychiatric: Normal mood and affect.  Laboratory Data: Lab Results  Component Value Date   WBC 10.0 11/15/2012   HGB 15.3 11/15/2012   HCT 43.7 11/15/2012   MCV 89 11/15/2012   PLT 166 11/15/2012    Lab Results  Component Value Date   CREATININE 1.03 11/16/2012  No results found for: PSA  No results found for: TESTOSTERONE  No results found for: HGBA1C  Urinalysis    Component Value Date/Time   GLUCOSEU Negative 05/17/2015 1010   BILIRUBINUR Negative 05/17/2015 1010   NITRITE Positive* 05/17/2015 1010   LEUKOCYTESUR 2+* 05/17/2015 1010    Pertinent Imaging: As per HPI  1. Bladder neurogenesis - doing well on CIC, congratulated. Discussed retrial of BTX with 200IU in OR if urgency beocmes more problematic, otherwise RTC 1year with renal US and BNMP.   Return in about 1 year (around 07/12/2016).  Alexis Frock, [facility] [address], [place], [place] [address] [phone]

## 2015-09-26 DIAGNOSIS — Z789 Other specified health status: Secondary | ICD-10-CM

## 2015-09-26 HISTORY — DX: Other specified health status: Z78.9

## 2015-12-13 ENCOUNTER — Other Ambulatory Visit: Payer: Self-pay | Admitting: Vascular Surgery

## 2015-12-14 ENCOUNTER — Encounter
Admission: RE | Admit: 2015-12-14 | Discharge: 2015-12-14 | Disposition: A | Discharge status: Home / Self Care | Payer: Medicare Other | Source: Ambulatory Visit | Attending: Vascular Surgery | Admitting: Vascular Surgery

## 2015-12-14 DIAGNOSIS — I10 Essential (primary) hypertension: Secondary | ICD-10-CM | POA: Diagnosis not present

## 2015-12-14 DIAGNOSIS — R700 Elevated erythrocyte sedimentation rate: Secondary | ICD-10-CM | POA: Diagnosis present

## 2015-12-14 DIAGNOSIS — I509 Heart failure, unspecified: Secondary | ICD-10-CM | POA: Diagnosis not present

## 2015-12-14 DIAGNOSIS — Z8249 Family history of ischemic heart disease and other diseases of the circulatory system: Secondary | ICD-10-CM | POA: Diagnosis not present

## 2015-12-14 DIAGNOSIS — E785 Hyperlipidemia, unspecified: Secondary | ICD-10-CM | POA: Diagnosis not present

## 2015-12-14 DIAGNOSIS — R51 Headache: Secondary | ICD-10-CM | POA: Diagnosis not present

## 2015-12-14 DIAGNOSIS — Z951 Presence of aortocoronary bypass graft: Secondary | ICD-10-CM | POA: Diagnosis not present

## 2015-12-14 DIAGNOSIS — H538 Other visual disturbances: Secondary | ICD-10-CM | POA: Diagnosis not present

## 2015-12-14 DIAGNOSIS — Z7982 Long term (current) use of aspirin: Secondary | ICD-10-CM | POA: Diagnosis not present

## 2015-12-14 DIAGNOSIS — Z833 Family history of diabetes mellitus: Secondary | ICD-10-CM | POA: Diagnosis not present

## 2015-12-14 DIAGNOSIS — Z79899 Other long term (current) drug therapy: Secondary | ICD-10-CM | POA: Diagnosis not present

## 2015-12-14 HISTORY — DX: Benign prostatic hyperplasia without lower urinary tract symptoms: N40.0

## 2015-12-14 HISTORY — DX: Anxiety disorder, unspecified: F41.9

## 2015-12-14 HISTORY — DX: Personal history of other diseases of urinary system: Z87.448

## 2015-12-14 HISTORY — DX: Overactive bladder: N32.81

## 2015-12-14 HISTORY — DX: Radiculopathy, lumbar region: M54.16

## 2015-12-14 LAB — APTT: APTT: 27 s (ref 24–36)

## 2015-12-14 LAB — CBC WITH DIFFERENTIAL/PLATELET
BASOS PCT: 0 %
Basophils Absolute: 0 10*3/uL (ref 0–0.1)
EOS PCT: 2 %
Eosinophils Absolute: 0.2 10*3/uL (ref 0–0.7)
HCT: 42.5 % (ref 40.0–52.0)
Hemoglobin: 14.3 g/dL (ref 13.0–18.0)
Lymphocytes Relative: 20 %
Lymphs Abs: 1.8 10*3/uL (ref 1.0–3.6)
MCH: 29.7 pg (ref 26.0–34.0)
MCHC: 33.7 g/dL (ref 32.0–36.0)
MCV: 88.1 fL (ref 80.0–100.0)
MONO ABS: 0.7 10*3/uL (ref 0.2–1.0)
MONOS PCT: 7 %
Neutro Abs: 6.6 10*3/uL — ABNORMAL HIGH (ref 1.4–6.5)
Neutrophils Relative %: 71 %
PLATELETS: 186 10*3/uL (ref 150–440)
RBC: 4.83 MIL/uL (ref 4.40–5.90)
RDW: 13.5 % (ref 11.5–14.5)
WBC: 9.3 10*3/uL (ref 3.8–10.6)

## 2015-12-14 LAB — TYPE AND SCREEN
ABO/RH(D): O NEG
ANTIBODY SCREEN: NEGATIVE

## 2015-12-14 LAB — PROTIME-INR
INR: 1.04
Prothrombin Time: 13.8 seconds (ref 11.4–15.0)

## 2015-12-14 LAB — BASIC METABOLIC PANEL
Anion gap: 7 (ref 5–15)
BUN: 11 mg/dL (ref 6–20)
CALCIUM: 8.9 mg/dL (ref 8.9–10.3)
CO2: 28 mmol/L (ref 22–32)
Chloride: 103 mmol/L (ref 101–111)
Creatinine, Ser: 0.9 mg/dL (ref 0.61–1.24)
GFR calc Af Amer: >60 mL/min (ref >60)
GFR calc non Af Amer: >60 mL/min (ref >60)
GLUCOSE: 110 mg/dL — AB (ref 65–99)
Potassium: 4 mmol/L (ref 3.5–5.1)
Sodium: 138 mmol/L (ref 135–145)

## 2015-12-14 NOTE — Patient Instructions (Signed)
  Your procedure is scheduled on: December 16, 2015 (Thursday) Report to Day Surgery.[facility]) Second Floor To find out your arrival time please call [phone] between 1PM - 3PM on December 15, 2015 (Wednesday).  Remember: Instructions that are not followed completely may result in serious medical risk, up to and including death, or upon the discretion of your surgeon and anesthesiologist your surgery may need to be rescheduled.    _x__ 1. Do not eat food or drink liquids after midnight. No gum chewing or hard candies.     ____ 2. No Alcohol for 24 hours before or after surgery.   ____ 3. Bring all medications with you on the day of surgery if instructed.    __x__ 4. Notify your doctor if there is any change in your medical condition     (cold, fever, infections).     Do not wear jewelry, make-up, hairpins, clips or nail polish.  Do not wear lotions, powders, or perfumes. You may wear deodorant.  Do not shave 48 hours prior to surgery. Men may shave face and neck.  Do not bring valuables to the hospital.    [facility] is not responsible for any belongings or valuables.               Contacts, dentures or bridgework may not be worn into surgery.  Leave your suitcase in the car. After surgery it may be brought to your room.  For patients admitted to the hospital, discharge time is determined by your                treatment team.   Patients discharged the day of surgery will not be allowed to drive home.   Please read over the following fact sheets that you were given:   Surgical Site Infection Prevention   ____ Take these medicines the morning of surgery with A SIP OF WATER:    1. Ramipril  2. Rabeprazole  (Rabeprazole at bedtime on March 22)  3.   4.  5.  6.  ____ Fleet Enema (as directed)   ____ Use CHG Soap as directed  ____ Use inhalers on the day of surgery  ____ Stop metformin 2 days prior to surgery    ____ Take 1/2 of usual insulin dose the night before  surgery and none on the morning of surgery.   __x__ Stop Coumadin/Plavix/aspirin on (Dr. Saralyn Pilar office to notify patient if he is to stop Aspirin or continue)  __x__ Stop Anti-inflammatories on (NO NSAIDS)   ____ Stop supplements until after surgery.    ____ Bring C-Pap to the hospital.

## 2015-12-15 LAB — ABO/RH: ABO/RH(D): O NEG

## 2015-12-16 ENCOUNTER — Ambulatory Visit
Admission: RE | Admit: 2015-12-16 | Discharge: 2015-12-16 | Disposition: A | Discharge status: Home / Self Care | Payer: Medicare Other | Source: Ambulatory Visit | Attending: Vascular Surgery | Admitting: Vascular Surgery

## 2015-12-16 ENCOUNTER — Ambulatory Visit: Payer: Medicare Other | Admitting: Anesthesiology

## 2015-12-16 ENCOUNTER — Encounter: Payer: Self-pay | Admitting: *Deleted

## 2015-12-16 ENCOUNTER — Encounter
Admission: RE | Disposition: A | Discharge status: Home / Self Care | Payer: Self-pay | Source: Ambulatory Visit | Attending: Vascular Surgery

## 2015-12-16 DIAGNOSIS — Z833 Family history of diabetes mellitus: Secondary | ICD-10-CM | POA: Insufficient documentation

## 2015-12-16 DIAGNOSIS — R51 Headache: Secondary | ICD-10-CM | POA: Insufficient documentation

## 2015-12-16 DIAGNOSIS — Z8249 Family history of ischemic heart disease and other diseases of the circulatory system: Secondary | ICD-10-CM | POA: Insufficient documentation

## 2015-12-16 DIAGNOSIS — Z79899 Other long term (current) drug therapy: Secondary | ICD-10-CM | POA: Insufficient documentation

## 2015-12-16 DIAGNOSIS — I509 Heart failure, unspecified: Secondary | ICD-10-CM | POA: Insufficient documentation

## 2015-12-16 DIAGNOSIS — Z7982 Long term (current) use of aspirin: Secondary | ICD-10-CM | POA: Insufficient documentation

## 2015-12-16 DIAGNOSIS — I10 Essential (primary) hypertension: Secondary | ICD-10-CM | POA: Insufficient documentation

## 2015-12-16 DIAGNOSIS — H538 Other visual disturbances: Secondary | ICD-10-CM | POA: Insufficient documentation

## 2015-12-16 DIAGNOSIS — Z951 Presence of aortocoronary bypass graft: Secondary | ICD-10-CM | POA: Insufficient documentation

## 2015-12-16 DIAGNOSIS — E785 Hyperlipidemia, unspecified: Secondary | ICD-10-CM | POA: Insufficient documentation

## 2015-12-16 DIAGNOSIS — R700 Elevated erythrocyte sedimentation rate: Secondary | ICD-10-CM | POA: Insufficient documentation

## 2015-12-16 HISTORY — PX: ARTERY BIOPSY: SHX891

## 2015-12-16 SURGERY — BIOPSY TEMPORAL ARTERY
Anesthesia: General | Laterality: Right

## 2015-12-16 MED ORDER — SODIUM CHLORIDE 0.9 % IV SOLN
INTRAVENOUS | Status: DC
  Administered 2015-12-16: 16:00:00 via INTRAVENOUS

## 2015-12-16 MED ORDER — FENTANYL CITRATE (PF) 100 MCG/2ML IJ SOLN
INTRAMUSCULAR | Status: DC | PRN
  Administered 2015-12-16: 50 ug via INTRAVENOUS

## 2015-12-16 MED ORDER — LIDOCAINE-EPINEPHRINE 1 %-1:100000 IJ SOLN
INTRAMUSCULAR | Status: AC
  Filled 2015-12-16: qty 1

## 2015-12-16 MED ORDER — LIDOCAINE HCL (CARDIAC) 20 MG/ML IV SOLN
INTRAVENOUS | Status: DC | PRN
  Administered 2015-12-16: 60 mg via INTRAVENOUS

## 2015-12-16 MED ORDER — PHENYLEPHRINE HCL 10 MG/ML IJ SOLN
INTRAMUSCULAR | Status: DC | PRN
  Administered 2015-12-16: 100 ug via INTRAVENOUS

## 2015-12-16 MED ORDER — SODIUM CHLORIDE 0.9 % IR SOLN
Status: DC | PRN
  Administered 2015-12-16: 120 mL

## 2015-12-16 MED ORDER — ONDANSETRON HCL 4 MG/2ML IJ SOLN: 4.0000 mg | Freq: Once | INTRAMUSCULAR | Status: DC | PRN

## 2015-12-16 MED ORDER — DEXTROSE 5 % IV SOLN
2.0000 g | INTRAVENOUS | Status: AC
  Administered 2015-12-16: 2 g via INTRAVENOUS

## 2015-12-16 MED ORDER — HYDROCODONE-ACETAMINOPHEN 5-325 MG PO TABS: 1.0000 | ORAL_TABLET | Freq: Four times a day (QID) | ORAL | Status: DC | PRN

## 2015-12-16 MED ORDER — CEFAZOLIN SODIUM-DEXTROSE 2-3 GM-% IV SOLR
INTRAVENOUS | Status: AC
  Filled 2015-12-16: qty 50

## 2015-12-16 MED ORDER — FENTANYL CITRATE (PF) 100 MCG/2ML IJ SOLN: 25.0000 ug | INTRAMUSCULAR | Status: DC | PRN

## 2015-12-16 MED ORDER — LIDOCAINE-EPINEPHRINE 1 %-1:100000 IJ SOLN
INTRAMUSCULAR | Status: DC | PRN
  Administered 2015-12-16: 10 mL

## 2015-12-16 MED ORDER — PROPOFOL 500 MG/50ML IV EMUL
INTRAVENOUS | Status: DC | PRN
  Administered 2015-12-16: 120 ug/kg/min via INTRAVENOUS

## 2015-12-16 MED ORDER — MIDAZOLAM HCL 2 MG/2ML IJ SOLN
INTRAMUSCULAR | Status: DC | PRN
  Administered 2015-12-16: 1 mg via INTRAVENOUS

## 2015-12-16 SURGICAL SUPPLY — 43 items
BLADE CLIPPER SURG (BLADE) ×3 IMPLANT
BLADE SURG 15 STRL LF DISP TIS (BLADE) ×1 IMPLANT
BLADE SURG 15 STRL SS (BLADE) ×2
BLADE SURG SZ11 CARB STEEL (BLADE) ×3 IMPLANT
CNTNR SPEC 2.5X3XGRAD LEK (MISCELLANEOUS)
CONT SPEC 4OZ STER OR WHT (MISCELLANEOUS)
CONTAINER SPEC 2.5X3XGRAD LEK (MISCELLANEOUS) IMPLANT
COTTON BALL STRL MEDIUM (GAUZE/BANDAGES/DRESSINGS) IMPLANT
DECANTER SPIKE VIAL GLASS SM (MISCELLANEOUS) ×3 IMPLANT
DRAPE LAPAROTOMY 77X122 PED (DRAPES) ×3 IMPLANT
DRESSING TELFA 4X3 1S ST N-ADH (GAUZE/BANDAGES/DRESSINGS) ×3 IMPLANT
ELECT CAUTERY BLADE 6.4 (BLADE) ×3 IMPLANT
ELECT REM PT RETURN 9FT ADLT (ELECTROSURGICAL) ×3
ELECTRODE REM PT RTRN 9FT ADLT (ELECTROSURGICAL) ×1 IMPLANT
GLOVE BIO SURGEON STRL SZ7 (GLOVE) ×9 IMPLANT
GLOVE BIOGEL PI IND STRL 7.0 (GLOVE) ×1 IMPLANT
GLOVE BIOGEL PI INDICATOR 7.0 (GLOVE) ×2
GLOVE INDICATOR 7.5 STRL GRN (GLOVE) ×3 IMPLANT
GOWN L4 XLG 20 PK N/S (GOWN DISPOSABLE) ×3 IMPLANT
GOWN STRL REUS W/ TWL LRG LVL3 (GOWN DISPOSABLE) ×1 IMPLANT
GOWN STRL REUS W/TWL LRG LVL3 (GOWN DISPOSABLE) ×2
GOWN STRL REUS W/TWL MED LVL3 (GOWN DISPOSABLE) ×3 IMPLANT
KIT RM TURNOVER STRD PROC AR (KITS) ×3 IMPLANT
LABEL OR SOLS (LABEL) ×3 IMPLANT
LIQUID BAND (GAUZE/BANDAGES/DRESSINGS) ×3 IMPLANT
NEEDLE HYPO 25X1 1.5 SAFETY (NEEDLE) IMPLANT
NS IRRIG 500ML POUR BTL (IV SOLUTION) ×3 IMPLANT
PACK BASIN MINOR ARMC (MISCELLANEOUS) ×3 IMPLANT
SOL PREP PVP 2OZ (MISCELLANEOUS) ×3
SOLUTION PREP PVP 2OZ (MISCELLANEOUS) ×1 IMPLANT
SUCTION FRAZIER HANDLE 10FR (MISCELLANEOUS) ×2
SUCTION TUBE FRAZIER 10FR DISP (MISCELLANEOUS) ×1 IMPLANT
SUT MNCRL AB 4-0 PS2 18 (SUTURE) ×3 IMPLANT
SUT SILK 2 0 (SUTURE) ×2
SUT SILK 2-0 18XBRD TIE 12 (SUTURE) ×1 IMPLANT
SUT SILK 3 0 (SUTURE) ×2
SUT SILK 3-0 18XBRD TIE 12 (SUTURE) ×1 IMPLANT
SUT SILK 4 0 (SUTURE) ×2
SUT SILK 4-0 18XBRD TIE 12 (SUTURE) ×1 IMPLANT
SUT VIC AB 3-0 SH 27 (SUTURE) ×2
SUT VIC AB 3-0 SH 27X BRD (SUTURE) ×1 IMPLANT
SYR BULB IRRIG 60ML STRL (SYRINGE) ×3 IMPLANT
SYRINGE 10CC LL (SYRINGE) ×3 IMPLANT

## 2015-12-16 NOTE — Transfer of Care (Signed)
Immediate Anesthesia Transfer of Care Note  Patient: Joseph Ritter  Procedure(s) Performed: Procedure(s): BIOPSY TEMPORAL ARTERY (Right)  Patient Location: PACU  Anesthesia Type:General  Level of Consciousness: awake, alert  and oriented  Airway & Oxygen Therapy: Patient Spontanous Breathing and Patient connected to nasal cannula oxygen  Post-op Assessment: Report given to RN and Post -op Vital signs reviewed and stable  Post vital signs: Reviewed  Last Vitals:  Filed Vitals:   12/16/15 1549 12/16/15 1732  BP: 144/79 120/62  Pulse: 90 81  Temp: 36.7 C 36.2 C  Resp: 16 11    Complications: No apparent anesthesia complications

## 2015-12-16 NOTE — Anesthesia Preprocedure Evaluation (Addendum)
Anesthesia Evaluation  Patient identified by MRN, date of birth, ID band Patient awake    Reviewed: Allergy & Precautions, NPO status , Patient's Chart, lab work & pertinent test results  Airway Mallampati: II  TM Distance: >3 FB Neck ROM: Limited    Dental  (+) Missing, Chipped   Pulmonary COPD,  COPD inhaler, former smoker,    Pulmonary exam normal breath sounds clear to auscultation       Cardiovascular hypertension, Pt. on medications + CAD and +CHF  Normal cardiovascular exam     Neuro/Psych PSYCHIATRIC DISORDERS Anxiety Depression Radiculitis Hx   With leg weakness  Neuromuscular disease    GI/Hepatic Neg liver ROS, PUD, GERD  Medicated and Controlled,  Endo/Other  diabetes, Well Controlled, Type 2Hypothyroidism   Renal/GU negative Renal ROS Bladder dysfunction  BPH    Musculoskeletal  (+) Arthritis , Osteoarthritis,    Abdominal Normal abdominal exam  (+)   Peds negative pediatric ROS (+)  Hematology negative hematology ROS (+)   Anesthesia Other Findings Bladder dysfunction Sub-acute transverse myelitis  Reproductive/Obstetrics                            Anesthesia Physical Anesthesia Plan  ASA: III  Anesthesia Plan: General and MAC   Post-op Pain Management:    Induction: Intravenous  Airway Management Planned: Nasal Cannula  Additional Equipment:   Intra-op Plan:   Post-operative Plan:   Informed Consent: I have reviewed the patients History and Physical, chart, labs and discussed the procedure including the risks, benefits and alternatives for the proposed anesthesia with the patient or authorized representative who has indicated his/her understanding and acceptance.   Dental advisory given  Plan Discussed with: CRNA and Surgeon  Anesthesia Plan Comments:         Anesthesia Quick Evaluation

## 2015-12-16 NOTE — H&P (Signed)
  [facility] History & Physical Update  The patient was interviewed and re-examined.  The patient's previous History and Physical has been reviewed and is unchanged.  There is no change in the plan of care. We plan to proceed with the scheduled procedure.  DEW,JASON, MD  12/16/2015, 3:52 PM

## 2015-12-16 NOTE — OR Nursing (Signed)
Patient resting quietly, no complaints.

## 2015-12-16 NOTE — Op Note (Signed)
        OPERATIVE NOTE   PRE-OPERATIVE DIAGNOSIS: headaches, visual symptoms, elevated sed rate, suspected temporal arteritis POST-OPERATIVE DIAGNOSIS: Same as above  PROCEDURE: 1.   right temporal artery biopsy  SURGEON: DEW,JASON, MD  ASSISTANT(S): Hezzie Bump, PA-C  ANESTHESIA: MAC  ESTIMATED BLOOD LOSS: Minimal  FINDING(S): 1.  none  SPECIMEN(S):  right superficial temporal artery sent to pathology  INDICATIONS:   Patient is a 75 y.o. male who presents with headaches, visual symptoms and elevated sed rate worrisome for giant cell arteritis.We were consulted by his primary care physician for consideration for temporal artery biopsy. Risks and benefits were discussed and he was agreeable to proceed.  DESCRIPTION: After obtaining full informed written consent, the patient was brought back to the operating room and placed supine upon the operating table.  The patient received IV antibiotics prior to induction.  After obtaining adequate anesthesia, the patient was prepped and draped in the standard fashion. The area in front of his right ear was anesthetized copiously with a solution of 1% lidocaine and half percent Marcaine without epinephrine. I then made an incision just in front of the right ear overlying the palpable pulse. I then dissected down through the subcutaneous tissues and identified the superficial temporal artery. This was dissected out over a several centimeters and branches were ligated and divided between silk ties. Care was used to avoid electrocautery around the artery. I then clamped the artery proximally and distally and transected the artery. The specimen was then sent to pathology. The proximal and distal artery were ligated with 3-0 silk ties. Hemostasis was achieved. The wound was then closed with a series of interrupted 3-0 Vicryl's and the skin was closed with a 4-0 Monocryl. Sterile dressing was placed. The patient was taken to the recovery room in stable  condition having tolerated the procedure well.  COMPLICATIONS: None  CONDITION: Stable   DEW,JASON 12/16/2015 5:18 PM

## 2015-12-16 NOTE — Anesthesia Postprocedure Evaluation (Signed)
Anesthesia Post Note  Patient: Joseph Ritter  Procedure(s) Performed: Procedure(s) (LRB): BIOPSY TEMPORAL ARTERY (Right)  Patient location during evaluation: PACU Anesthesia Type: General Level of consciousness: awake and alert and oriented Pain management: pain level controlled Vital Signs Assessment: post-procedure vital signs reviewed and stable Respiratory status: spontaneous breathing Cardiovascular status: blood pressure returned to baseline Anesthetic complications: no    Last Vitals:  Filed Vitals:   12/16/15 1810 12/16/15 1840  BP: 138/71 153/85  Pulse: 76 72  Temp: 36.3 C   Resp: 18 16    Last Pain:  Filed Vitals:   12/16/15 1841  PainSc: 0-No pain                 Jaiona Simien

## 2015-12-16 NOTE — Discharge Instructions (Signed)
AMBULATORY SURGERY  °DISCHARGE INSTRUCTIONS ° ° °1) The drugs that you were given will stay in your system until tomorrow so for the next 24 hours you should not: ° °A) Drive an automobile °B) Make any legal decisions °C) Drink any alcoholic beverage ° ° °2) You may resume regular meals tomorrow.  Today it is better to start with liquids and gradually work up to solid foods. ° °You may eat anything you prefer, but it is better to start with liquids, then soup and crackers, and gradually work up to solid foods. ° ° °3) Please notify your doctor immediately if you have any unusual bleeding, trouble breathing, redness and pain at the surgery site, drainage, fever, or pain not relieved by medication. ° ° ° °4) Additional Instructions: ° ° ° ° ° ° ° °Please contact your physician with any problems or Same Day Surgery at [phone], Monday through Friday 6 am to 4 pm, or [place] [facility] [facility] number at [phone]. °

## 2015-12-17 ENCOUNTER — Encounter: Payer: Self-pay | Admitting: Vascular Surgery

## 2015-12-20 LAB — SURGICAL PATHOLOGY

## 2016-01-21 ENCOUNTER — Encounter: Payer: Self-pay | Admitting: *Deleted

## 2016-01-21 NOTE — Discharge Instructions (Signed)

## 2016-01-26 ENCOUNTER — Ambulatory Visit: Payer: Medicare Other | Admitting: Anesthesiology

## 2016-01-26 ENCOUNTER — Encounter
Admission: RE | Disposition: A | Discharge status: Home / Self Care | Payer: Self-pay | Source: Ambulatory Visit | Attending: Ophthalmology

## 2016-01-26 ENCOUNTER — Ambulatory Visit
Admission: RE | Admit: 2016-01-26 | Discharge: 2016-01-26 | Disposition: A | Discharge status: Home / Self Care | Payer: Medicare Other | Source: Ambulatory Visit | Attending: Ophthalmology | Admitting: Ophthalmology

## 2016-01-26 DIAGNOSIS — E119 Type 2 diabetes mellitus without complications: Secondary | ICD-10-CM | POA: Insufficient documentation

## 2016-01-26 DIAGNOSIS — F329 Major depressive disorder, single episode, unspecified: Secondary | ICD-10-CM | POA: Diagnosis not present

## 2016-01-26 DIAGNOSIS — I2510 Atherosclerotic heart disease of native coronary artery without angina pectoris: Secondary | ICD-10-CM | POA: Insufficient documentation

## 2016-01-26 DIAGNOSIS — Z7982 Long term (current) use of aspirin: Secondary | ICD-10-CM | POA: Insufficient documentation

## 2016-01-26 DIAGNOSIS — Z9889 Other specified postprocedural states: Secondary | ICD-10-CM | POA: Insufficient documentation

## 2016-01-26 DIAGNOSIS — K589 Irritable bowel syndrome without diarrhea: Secondary | ICD-10-CM | POA: Insufficient documentation

## 2016-01-26 DIAGNOSIS — H269 Unspecified cataract: Secondary | ICD-10-CM | POA: Diagnosis present

## 2016-01-26 DIAGNOSIS — H57052 Tonic pupil, left eye: Secondary | ICD-10-CM | POA: Diagnosis not present

## 2016-01-26 DIAGNOSIS — E7800 Pure hypercholesterolemia, unspecified: Secondary | ICD-10-CM | POA: Insufficient documentation

## 2016-01-26 DIAGNOSIS — M1990 Unspecified osteoarthritis, unspecified site: Secondary | ICD-10-CM | POA: Diagnosis not present

## 2016-01-26 DIAGNOSIS — K219 Gastro-esophageal reflux disease without esophagitis: Secondary | ICD-10-CM | POA: Insufficient documentation

## 2016-01-26 DIAGNOSIS — I10 Essential (primary) hypertension: Secondary | ICD-10-CM | POA: Insufficient documentation

## 2016-01-26 DIAGNOSIS — Z87891 Personal history of nicotine dependence: Secondary | ICD-10-CM | POA: Insufficient documentation

## 2016-01-26 DIAGNOSIS — M791 Myalgia: Secondary | ICD-10-CM | POA: Diagnosis not present

## 2016-01-26 DIAGNOSIS — G894 Chronic pain syndrome: Secondary | ICD-10-CM | POA: Diagnosis not present

## 2016-01-26 DIAGNOSIS — Z79891 Long term (current) use of opiate analgesic: Secondary | ICD-10-CM | POA: Diagnosis not present

## 2016-01-26 DIAGNOSIS — Z951 Presence of aortocoronary bypass graft: Secondary | ICD-10-CM | POA: Diagnosis not present

## 2016-01-26 DIAGNOSIS — Z79899 Other long term (current) drug therapy: Secondary | ICD-10-CM | POA: Insufficient documentation

## 2016-01-26 DIAGNOSIS — Z955 Presence of coronary angioplasty implant and graft: Secondary | ICD-10-CM | POA: Diagnosis not present

## 2016-01-26 DIAGNOSIS — H2512 Age-related nuclear cataract, left eye: Secondary | ICD-10-CM | POA: Insufficient documentation

## 2016-01-26 HISTORY — PX: CATARACT EXTRACTION W/PHACO: SHX586

## 2016-01-26 HISTORY — DX: Disorder of teeth and supporting structures, unspecified: K08.9

## 2016-01-26 SURGERY — PHACOEMULSIFICATION, CATARACT, WITH IOL INSERTION
Anesthesia: Monitor Anesthesia Care | Laterality: Left | Wound class: Clean

## 2016-01-26 MED ORDER — TETRACAINE HCL 0.5 % OP SOLN
1.0000 [drp] | OPHTHALMIC | Status: DC | PRN
  Administered 2016-01-26: 1 [drp] via OPHTHALMIC

## 2016-01-26 MED ORDER — FENTANYL CITRATE (PF) 100 MCG/2ML IJ SOLN
INTRAMUSCULAR | Status: DC | PRN
  Administered 2016-01-26: 50 ug via INTRAVENOUS

## 2016-01-26 MED ORDER — BRIMONIDINE TARTRATE 0.2 % OP SOLN
OPHTHALMIC | Status: DC | PRN
  Administered 2016-01-26: 1 [drp] via OPHTHALMIC

## 2016-01-26 MED ORDER — LACTATED RINGERS IV SOLN: INTRAVENOUS | Status: DC

## 2016-01-26 MED ORDER — POVIDONE-IODINE 5 % OP SOLN
1.0000 "application " | OPHTHALMIC | Status: DC | PRN
  Administered 2016-01-26: 1 via OPHTHALMIC

## 2016-01-26 MED ORDER — CEFUROXIME OPHTHALMIC INJECTION 1 MG/0.1 ML
INJECTION | OPHTHALMIC | Status: DC | PRN
  Administered 2016-01-26: 0.1 mL via OPHTHALMIC

## 2016-01-26 MED ORDER — TIMOLOL MALEATE 0.5 % OP SOLN
OPHTHALMIC | Status: DC | PRN
  Administered 2016-01-26: 1 [drp] via OPHTHALMIC

## 2016-01-26 MED ORDER — MIDAZOLAM HCL 2 MG/2ML IJ SOLN
INTRAMUSCULAR | Status: DC | PRN
  Administered 2016-01-26: 2 mg via INTRAVENOUS

## 2016-01-26 MED ORDER — LIDOCAINE HCL (PF) 4 % IJ SOLN
INTRAOCULAR | Status: DC | PRN
  Administered 2016-01-26: 1 mL via OPHTHALMIC

## 2016-01-26 MED ORDER — BSS IO SOLN
INTRAOCULAR | Status: DC | PRN
  Administered 2016-01-26: 100 mL

## 2016-01-26 MED ORDER — ACETAMINOPHEN 325 MG PO TABS: 325.0000 mg | ORAL_TABLET | ORAL | Status: DC | PRN

## 2016-01-26 MED ORDER — NA HYALUR & NA CHOND-NA HYALUR 0.4-0.35 ML IO KIT
PACK | INTRAOCULAR | Status: DC | PRN
Start: 2016-01-26 — End: 2016-01-26
  Administered 2016-01-26: 1 mL via INTRAOCULAR

## 2016-01-26 MED ORDER — ARMC OPHTHALMIC DILATING GEL
1.0000 "application " | OPHTHALMIC | Status: DC | PRN
  Administered 2016-01-26 (×2): 1 via OPHTHALMIC

## 2016-01-26 MED ORDER — ACETAMINOPHEN 160 MG/5ML PO SOLN: 325.0000 mg | ORAL | Status: DC | PRN

## 2016-01-26 SURGICAL SUPPLY — 21 items
CANNULA ANT/CHMB 27GA (MISCELLANEOUS) ×3 IMPLANT
CARTRIDGE ABBOTT (MISCELLANEOUS) IMPLANT
GLOVE SURG LX 7.5 STRW (GLOVE) ×2
GLOVE SURG LX STRL 7.5 STRW (GLOVE) ×1 IMPLANT
GLOVE SURG TRIUMPH 8.0 PF LTX (GLOVE) ×3 IMPLANT
GOWN STRL REUS W/ TWL LRG LVL3 (GOWN DISPOSABLE) ×2 IMPLANT
GOWN STRL REUS W/TWL LRG LVL3 (GOWN DISPOSABLE) ×4
LENS IOL TECNIS ITEC 20.0 (Intraocular Lens) ×3 IMPLANT
MARKER SKIN DUAL TIP RULER LAB (MISCELLANEOUS) ×3 IMPLANT
NDL RETROBULBAR .5 NSTRL (NEEDLE) IMPLANT
PACK CATARACT BRASINGTON (MISCELLANEOUS) ×3 IMPLANT
PACK EYE AFTER SURG (MISCELLANEOUS) ×3 IMPLANT
PACK OPTHALMIC (MISCELLANEOUS) ×3 IMPLANT
RING MALYGIN 7.0 (MISCELLANEOUS) ×3 IMPLANT
SUT ETHILON 10-0 CS-B-6CS-B-6 (SUTURE)
SUT VICRYL  9 0 (SUTURE)
SUT VICRYL 9 0 (SUTURE) IMPLANT
SUTURE EHLN 10-0 CS-B-6CS-B-6 (SUTURE) IMPLANT
SYR TB 1ML LUER SLIP (SYRINGE) ×3 IMPLANT
WATER STERILE IRR 250ML POUR (IV SOLUTION) ×3 IMPLANT
WIPE NON LINTING 3.25X3.25 (MISCELLANEOUS) ×3 IMPLANT

## 2016-01-26 NOTE — Anesthesia Procedure Notes (Signed)
Procedure Name: MAC Performed by: Stefen Juba Pre-anesthesia Checklist: Patient identified, Emergency Drugs available, Suction available, Timeout performed and Patient being monitored Patient Re-evaluated:Patient Re-evaluated prior to inductionOxygen Delivery Method: Nasal cannula Placement Confirmation: positive ETCO2       

## 2016-01-26 NOTE — Anesthesia Postprocedure Evaluation (Signed)
Anesthesia Post Note  Patient: Curley Fallo [place]  Procedure(s) Performed: Procedure(s) (LRB): CATARACT EXTRACTION PHACO AND INTRAOCULAR LENS PLACEMENT (IOC) LEFT EYE (Left)  Patient location during evaluation: PACU Anesthesia Type: MAC Level of consciousness: awake and alert and oriented Pain management: satisfactory to patient Vital Signs Assessment: post-procedure vital signs reviewed and stable Respiratory status: spontaneous breathing, nonlabored ventilation and respiratory function stable Cardiovascular status: blood pressure returned to baseline and stable Postop Assessment: Adequate PO intake and No signs of nausea or vomiting Anesthetic complications: no    Raliegh Ip

## 2016-01-26 NOTE — Transfer of Care (Signed)
Immediate Anesthesia Transfer of Care Note  Patient: Joseph Ritter  Procedure(s) Performed: Procedure(s): CATARACT EXTRACTION PHACO AND INTRAOCULAR LENS PLACEMENT (IOC) LEFT EYE (Left)  Patient Location: PACU  Anesthesia Type: MAC  Level of Consciousness: awake, alert  and patient cooperative  Airway and Oxygen Therapy: Patient Spontanous Breathing and Patient connected to supplemental oxygen  Post-op Assessment: Post-op Vital signs reviewed, Patient's Cardiovascular Status Stable, Respiratory Function Stable, Patent Airway and No signs of Nausea or vomiting  Post-op Vital Signs: Reviewed and stable  Complications: No apparent anesthesia complications

## 2016-01-26 NOTE — Op Note (Signed)
OPERATIVE NOTE  Joseph Ritter [id number] 01/26/2016  PREOPERATIVE DIAGNOSIS:   Nuclear sclerotic cataract left eye with miotic pupil      H25.12   POSTOPERATIVE DIAGNOSIS:   Nuclear sclerotic cataract left eye with miotic pupil.     PROCEDURE:  Phacoemulsification with posterior chamber intraocular lens implantation of the left eye which required pupil stretching with the Malyugin pupil expansion device   LENS:   Implant Name Type Inv. Item Serial No. Manufacturer Lot No. LRB No. Used  20.0D PCBOO lens     [id number] [facility]   Left 1        ULTRASOUND TIME: 24 % of 1 minutes, 31 seconds.  CDE 22.0   SURGEON:  Wyonia Hough, MD   ANESTHESIA: Topical with tetracaine drops and 2% Xylocaine jelly, augmented with 1% preservative-free intracameral lidocaine.   COMPLICATIONS:  None.   DESCRIPTION OF PROCEDURE:  The patient was identified in the holding room and transported to the operating room and placed in the supine position under the operating microscope.  The left eye was identified as the operative eye and it was prepped and draped in the usual sterile ophthalmic fashion.   A 1 millimeter clear-corneal paracentesis was made at the 1:30 position.  The anterior chamber was filled with Viscoat viscoelastic.  0.5 ml of preservative-free 1% lidocaine was injected into the anterior chamber.  A 2.4 millimeter keratome was used to make a near-clear corneal incision at the 10:30 position.  A Malyugin pupil expander was then placed through the main incision and into the anterior chamber of the eye.  The edge of the iris was secured on the lip of the pupil expander and it was released, thereby expanding the pupil to approximately 7 millimeters for completion of the cataract surgery.  Additional Viscoat was placed in the anterior chamber.  A cystotome and capsulorrhexis forceps were used to make a curvilinear capsulorrhexis.   Balanced salt solution was used to hydrodissect and  hydrodelineate the lens nucleus.   Phacoemulsification was used in stop and chop fashion to remove the lens, nucleus and epinucleus.  The remaining cortex was aspirated using the irrigation aspiration handpiece.  Additional Provisc was placed into the eye to distend the capsular bag for lens placement.  A lens was then injected into the capsular bag.  The pupil expanding ring was removed using a Kuglen hook and insertion device. The remaining viscoelastic was aspirated from the capsular bag and the anterior chamber.  The anterior chamber was filled with balanced salt solution to inflate to a physiologic pressure.   Wounds were hydrated with balanced salt solution.  The anterior chamber was inflated to a physiologic pressure with balanced salt solution.  No wound leaks were noted. Cefuroxime 0.1 ml of a 10mg /ml solution was injected into the anterior chamber for a dose of 1 mg of intracameral antibiotic at the completion of the case.   Timolol and Brimonidine drops were applied to the eye.  The patient was taken to the recovery room in stable condition without complications of anesthesia or surgery.  Aide Wojnar 01/26/2016, 10:48 AM

## 2016-01-26 NOTE — H&P (Signed)
  The History and Physical notes are on paper, have been signed, and are to be scanned. The patient remains stable and unchanged from the H&P.   Previous H&P reviewed, patient examined, and there are no changes.  Joseph Ritter 01/26/2016 9:50 AM

## 2016-01-26 NOTE — Anesthesia Preprocedure Evaluation (Signed)
Anesthesia Evaluation  Patient identified by MRN, date of birth, ID band  Reviewed: Allergy & Precautions, H&P , NPO status , Patient's Chart, lab work & pertinent test results  Airway Mallampati: II  TM Distance: >3 FB Neck ROM: full    Dental  (+) Dental Advidsory Given, Poor Dentition   Pulmonary COPD, former smoker,    Pulmonary exam normal        Cardiovascular hypertension, + CAD   Rhythm:regular Rate:Normal     Neuro/Psych PSYCHIATRIC DISORDERS    GI/Hepatic   Endo/Other  diabetesHypothyroidism   Renal/GU      Musculoskeletal   Abdominal   Peds  Hematology   Anesthesia Other Findings   Reproductive/Obstetrics                             Anesthesia Physical Anesthesia Plan  ASA: III  Anesthesia Plan: MAC   Post-op Pain Management:    Induction:   Airway Management Planned:   Additional Equipment:   Intra-op Plan:   Post-operative Plan:   Informed Consent: I have reviewed the patients History and Physical, chart, labs and discussed the procedure including the risks, benefits and alternatives for the proposed anesthesia with the patient or authorized representative who has indicated his/her understanding and acceptance.     Plan Discussed with: CRNA  Anesthesia Plan Comments:         Anesthesia Quick Evaluation

## 2016-01-27 ENCOUNTER — Encounter: Payer: Self-pay | Admitting: Ophthalmology

## 2016-03-13 ENCOUNTER — Other Ambulatory Visit: Payer: Self-pay | Admitting: Physician Assistant

## 2016-03-13 DIAGNOSIS — R1030 Lower abdominal pain, unspecified: Secondary | ICD-10-CM

## 2016-03-13 DIAGNOSIS — R140 Abdominal distension (gaseous): Secondary | ICD-10-CM

## 2016-06-21 ENCOUNTER — Ambulatory Visit
Admission: RE | Admit: 2016-06-21 | Discharge: 2016-06-21 | Disposition: A | Discharge status: Home / Self Care | Payer: Medicare Other | Source: Ambulatory Visit | Attending: Urology | Admitting: Urology

## 2016-06-21 DIAGNOSIS — N319 Neuromuscular dysfunction of bladder, unspecified: Secondary | ICD-10-CM | POA: Insufficient documentation

## 2016-06-21 DIAGNOSIS — N2889 Other specified disorders of kidney and ureter: Secondary | ICD-10-CM | POA: Insufficient documentation

## 2016-06-26 ENCOUNTER — Encounter: Payer: Self-pay | Admitting: Urology

## 2016-06-26 ENCOUNTER — Ambulatory Visit (INDEPENDENT_AMBULATORY_CARE_PROVIDER_SITE_OTHER): Payer: Medicare Other | Admitting: Urology

## 2016-06-26 VITALS — BP 191/64 | HR 92 | Ht 66.0 in | Wt 178.0 lb

## 2016-06-26 DIAGNOSIS — R3915 Urgency of urination: Secondary | ICD-10-CM | POA: Diagnosis not present

## 2016-06-26 DIAGNOSIS — N2889 Other specified disorders of kidney and ureter: Secondary | ICD-10-CM | POA: Diagnosis not present

## 2016-06-26 DIAGNOSIS — N319 Neuromuscular dysfunction of bladder, unspecified: Secondary | ICD-10-CM

## 2016-06-26 DIAGNOSIS — R3129 Other microscopic hematuria: Secondary | ICD-10-CM

## 2016-06-26 LAB — MICROSCOPIC EXAMINATION

## 2016-06-26 LAB — URINALYSIS, COMPLETE
BILIRUBIN UA: NEGATIVE
Glucose, UA: NEGATIVE
KETONES UA: NEGATIVE
NITRITE UA: NEGATIVE
PH UA: 5 (ref 5.0–7.5)
Protein, UA: NEGATIVE
SPEC GRAV UA: 1.02 (ref 1.005–1.030)
UUROB: 0.2 mg/dL (ref 0.2–1.0)

## 2016-06-26 NOTE — Progress Notes (Signed)
06/26/2016 10:09 PM   Joseph Ritter 11-Jan-1941 [id number]  Referring provider: [name], MD [facility] [place] [address]  Chief Complaint  Patient presents with  . Follow-up    one year / Bladder neurogenesis, Benign fibroma of prostate    HPI: Patient is 75 year old Caucasian male with history of neurogenic bladder who presents today for his yearly follow-up.  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 UDS at [place] confirmed DHIC pattern with small contractions but inefficient emptying, PVR's >417ml. Manages with self cath about QID. Tried office BTX 100 IH and oral anticholinergics w/o help for urgency component which is minimal bother. No incontinence.  Recent Surveillance: 2017 RUS noted No hydronephrosis.  Echogenic kidneys, indicating nonspecific renal parenchymal disease of uncertain chronicity. Mixed echogenicity focus in the interpolar right kidney, indeterminate for a right renal mass. Renal mass protocol MRI (preferred) or CT of the abdomen without and with IV contrast is recommended for further evaluation. Unremarkable bladder.    Recent creatinine 0.90 on 12/14/2015  He has baseline symptomatologies of frequency, urgency, nocturia, incontinence, intermittency, hesitancy, straining to urinate and a weak urinary stream.  Recently he has noted an increase in his urgency and nocturia.  Over the last two weeks, he has to CIC up to 6 or 7 times a day.    He is feeling the urge to urinate, caths himself and then 20 minutes later he has the urgency again.  He caths again and the volume is miniscule.  He has not had gross hematuria or pain with cathing.  He has not had recent fevers, chills, nausea or vomiting.   His UA today is positive for 3-10 RBC's and >30 WBC's.    PMH: Past Medical History:  Diagnosis Date  . Acid indigestion 03/09/2014    . Adult hypothyroidism 11/18/2012  . Anxiety   . Arteriosclerosis of coronary artery 11/18/2012   Overview:  PATENT LIMA TO LAD, PATENT SVG TO PDA AND OCCULUDED SVG TO OM2 BY CATHERIZATION 02/09/2009   . 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 Borderline diabetes 05/25/2014  . BP (high blood pressure) 11/18/2012  . BPH (benign prostatic hypertrophy)   . CAFL (chronic airflow limitation) ([place]) 05/03/2015  . CCF (congestive cardiac failure) ([place]) 01/03/2014   Overview:  HX OF   . Clinical depression 05/03/2015  . DDD (degenerative disc disease), lumbar 06/29/2014  . 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 Diabetes mellitus without complication ([place])   . Gastric ulcer 03/09/2014  . 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 History of urinary self-catheterization 2017  . HLD (hyperlipidemia) 01/03/2014  . Incomplete bladder emptying 11/18/2012  . Leg weakness 11/18/2012  . Lumbar radiculitis   . Neuritis or radiculitis due to rupture of lumbar intervertebral disc 06/29/2014  . Overactive bladder   . 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 Teeth problem    pt reports "bad teeth", "need to be pulled"    Surgical History: Past Surgical History:  Procedure Laterality Date  . ARTERY BIOPSY Right 12/16/2015   Procedure: BIOPSY TEMPORAL ARTERY;  Surgeon: Algernon Huxley, MD;  Location: [facility];  Service: Vascular;  Laterality: Right;  . CARDIAC CATHETERIZATION    . CATARACT EXTRACTION W/PHACO Left 01/26/2016   Procedure: CATARACT EXTRACTION  PHACO AND INTRAOCULAR LENS PLACEMENT (IOC) LEFT EYE;  Surgeon: Leandrew Koyanagi, MD;  Location: [facility];  Service: Ophthalmology;  Laterality: Left;  . CORONARY ARTERY BYPASS GRAFT  04/07/2002   [facility]  . Cysto Bladder Botox Injection  07/02/2014  . EYE SURGERY Left    blood clot behind left eye  . HAND SURGERY  Bilateral   . HERNIA REPAIR Right    inguinal hernia repair    Home Medications:    Medication List       Accurate as of 06/26/16 11:59 PM. Always use your most recent med list.          acetaminophen 500 MG tablet Commonly known as:  TYLENOL Take 500 mg by mouth every 6 (six) hours as needed.   aspirin EC 81 MG tablet Take 81 mg by mouth daily.   buPROPion 100 MG 12 hr tablet Commonly known as:  WELLBUTRIN SR TAKE ONE TABLET BY MOUTH ONCE A DAY   CALCIUM CITRATE + D 250-200 MG-UNIT Tabs Generic drug:  Calcium Citrate-Vitamin D Take by mouth.   CENTRUM SILVER tablet Take 1 tablet by mouth daily.   CITRUCEL 500 MG Tabs Generic drug:  Methylcellulose (Laxative) Take 1 tablet by mouth daily.   cyclobenzaprine 10 MG tablet Commonly known as:  FLEXERIL Reported on 12/16/2015   ezetimibe 10 MG tablet Commonly known as:  ZETIA Take 10 mg by mouth daily.   HYDROcodone-acetaminophen 5-325 MG tablet Commonly known as:  NORCO Take 1 tablet by mouth every 6 (six) hours as needed for moderate pain.   ketoconazole 2 % shampoo Commonly known as:  NIZORAL APPLY SHAMPOO TO HAIR AS DIRECTED TWICE A WEEK   Lactobacillus Acidophilus Powd Take by mouth.   levothyroxine 100 MCG tablet Commonly known as:  SYNTHROID, LEVOTHROID 1 TABLET BY MOUTH EVERY MORNING FOR THYROID   nitroGLYCERIN 0.4 MG SL tablet Commonly known as:  NITROSTAT Place 0.4 mg under the tongue every 5 (five) minutes as needed. Reported on 12/16/2015   polyethylene glycol powder powder Commonly known as:  GLYCOLAX/MIRALAX Take by mouth as needed.   predniSONE 20 MG tablet Commonly known as:  DELTASONE Take 60 mg by mouth daily with breakfast.   RABEprazole 20 MG tablet Commonly known as:  ACIPHEX Take 20 mg by mouth daily.   ramipril 10 MG capsule Commonly known as:  ALTACE TAKE ONE CAPSULE BY MOUTH EVERY DAY   senna-docusate 8.6-50 MG tablet Commonly known as:  Senokot-S Take 1 tablet by mouth  daily.   sertraline 100 MG tablet Commonly known as:  ZOLOFT TAKE 1 TABLET BY MOUTH ONCE A DAY   solifenacin 5 MG tablet Commonly known as:  VESICARE Take 5 mg by mouth daily.   sucralfate 1 g tablet Commonly known as:  CARAFATE patient not taking   tamsulosin 0.4 MG Caps capsule Commonly known as:  FLOMAX Take 0.8 mg by mouth daily after supper.   traMADol 50 MG tablet Commonly known as:  ULTRAM 1 qid prn   VYTORIN 10-40 MG tablet Generic drug:  ezetimibe-simvastatin TAKE 1 TABLET BY MOUTH EVERY DAY AT BEDTIME       Allergies: No Known Allergies  Family History: Family History  Problem Relation Age of Onset  . Heart attack Mother   . Heart disease Father   . Prostate cancer Neg Hx   . Bladder Cancer Neg Hx   . Kidney cancer Neg Hx     Social History:  reports that he quit smoking  about 14 years ago. His smoking use included Cigarettes. He has never used smokeless tobacco. He reports that he does not drink alcohol or use drugs.  ROS: UROLOGY Frequent Urination?: Yes Hard to postpone urination?: Yes Burning/pain with urination?: No Get up at night to urinate?: Yes Leakage of urine?: Yes Urine stream starts and stops?: Yes Trouble starting stream?: Yes Do you have to strain to urinate?: Yes Blood in urine?: No Urinary tract infection?: No Sexually transmitted disease?: No Injury to kidneys or bladder?: No Painful intercourse?: No Weak stream?: Yes Erection problems?: No Penile pain?: No  Gastrointestinal Nausea?: No Vomiting?: No Indigestion/heartburn?: No Diarrhea?: No Constipation?: No  Constitutional Fever: No Night sweats?: No Weight loss?: No Fatigue?: No  Skin Skin rash/lesions?: No Itching?: No  Eyes Blurred vision?: No Double vision?: No  Ears/Nose/Throat Sore throat?: No Sinus problems?: Yes  Hematologic/Lymphatic Swollen glands?: No Easy bruising?: No  Cardiovascular Leg swelling?: No Chest pain?:  No  Respiratory Cough?: No Shortness of breath?: No  Endocrine Excessive thirst?: No  Musculoskeletal Back pain?: Yes Joint pain?: Yes  Neurological Headaches?: No Dizziness?: No  Psychologic Depression?: Yes Anxiety?: Yes  Physical Exam: BP (!) 191/64   Pulse 92   Ht 5\' 6"  (1.676 m)   Wt 178 lb (80.7 kg)   BMI 28.73 kg/m   Constitutional: Well nourished. Alert and oriented, No acute distress. HEENT: [place] AT, moist mucus membranes. Trachea midline, no masses. Cardiovascular: No clubbing, cyanosis, or edema. Respiratory: Normal respiratory effort, no increased work of breathing. GI: Abdomen is soft, non tender, non distended, no abdominal masses. Liver and spleen not palpable.  No hernias appreciated.  Stool sample for occult testing is not indicated.   GU: No CVA tenderness.  No bladder fullness or masses.  Patient with circumcised phallus.  Urethral meatus is patent.  No penile discharge. No penile lesions or rashes. Scrotum without lesions, cysts, rashes and/or edema.  Testicles are located scrotally bilaterally. No masses are appreciated in the testicles. Left and right epididymis are normal. Rectal: Patient with  normal sphincter tone. Anus and perineum without scarring or rashes. No rectal masses are appreciated. Prostate is approximately 45 grams, no nodules are appreciated. Seminal vesicles are normal. Skin: No rashes, bruises or suspicious lesions. Lymph: No cervical or inguinal adenopathy. Neurologic: Grossly intact, no focal deficits, moving all 4 extremities. Psychiatric: Normal mood and affect.  Laboratory Data: Lab Results  Component Value Date   WBC 9.3 12/14/2015   HGB 14.3 12/14/2015   HCT 42.5 12/14/2015   MCV 88.1 12/14/2015   PLT 186 12/14/2015    Lab Results  Component Value Date   CREATININE 0.92 06/26/2016    Urinalysis Significant for > 30 WBC's and 3-10 RBC's.  See EPIC.    Pertinent Imaging: CLINICAL DATA:  Neurogenic  bladder.  EXAM: RENAL / URINARY TRACT ULTRASOUND COMPLETE  COMPARISON:  05/06/2015 renal sonogram.  FINDINGS: Right Kidney:  Length: 12.6 cm. Echogenic right kidney. No right hydronephrosis. Mixed echogenicity 3.5 x 3.6 x 3.8 cm focus in the interpolar right kidney is indeterminate for a renal mass.  Left Kidney:  Length: 12.6 cm. Echogenic left kidney. No left hydronephrosis. No left renal mass.  Bladder:  Appears normal for degree of bladder distention.  IMPRESSION: 1. No hydronephrosis. 2. Echogenic kidneys, indicating nonspecific renal parenchymal disease of uncertain chronicity. 3. Mixed echogenicity focus in the interpolar right kidney, indeterminate for a right renal mass. Renal mass protocol MRI (preferred) or CT of the abdomen without and  with IV contrast is recommended for further evaluation. 4. Unremarkable bladder.   Electronically Signed   By: Ilona Sorrel M.D.   On: 06/21/2016 12:27   Assessment & Plan:    1. Right renal mass  - seen on RUS completed 06/21/2016  - obtain a MRI for further clarification  - RTC for report  2. Urgency  - worsening  - UA suspicious for infection-send for culture  3. Neurogenic bladder  - Continue CIC- no refills needed at this time  - Continue tamsulosin and Vesicare  4. Microscopic hematuria  - will recheck urine upon return  - culture pending   Return for Patient will RTC for MRI report.  These notes generated with voice recognition software. I apologize for typographical errors.  Zara Council, [facility] [address] [place], [place] [address] [phone]

## 2016-06-27 LAB — BUN+CREAT
BUN / CREAT RATIO: 20 (ref 10–24)
BUN: 18 mg/dL (ref 8–27)
CREATININE: 0.92 mg/dL (ref 0.76–1.27)
GFR, EST AFRICAN AMERICAN: 95 mL/min/{1.73_m2} (ref >59)
GFR, EST NON AFRICAN AMERICAN: 82 mL/min/{1.73_m2} (ref >59)

## 2016-06-29 LAB — CULTURE, URINE COMPREHENSIVE

## 2016-07-07 ENCOUNTER — Ambulatory Visit
Admission: RE | Admit: 2016-07-07 | Discharge: 2016-07-07 | Disposition: A | Discharge status: Home / Self Care | Payer: Medicare Other | Source: Ambulatory Visit | Attending: Urology | Admitting: Urology

## 2016-07-07 DIAGNOSIS — N2889 Other specified disorders of kidney and ureter: Secondary | ICD-10-CM | POA: Diagnosis not present

## 2016-07-07 MED ORDER — GADOBENATE DIMEGLUMINE 529 MG/ML IV SOLN
16.0000 mL | Freq: Once | INTRAVENOUS | Status: AC | PRN
  Administered 2016-07-07: 16 mL via INTRAVENOUS

## 2016-07-14 ENCOUNTER — Ambulatory Visit: Payer: Medicare Other

## 2016-07-27 ENCOUNTER — Encounter: Payer: Self-pay | Admitting: Urology

## 2016-07-27 ENCOUNTER — Ambulatory Visit (INDEPENDENT_AMBULATORY_CARE_PROVIDER_SITE_OTHER): Payer: Medicare Other | Admitting: Urology

## 2016-07-27 VITALS — BP 166/76 | HR 76 | Ht 66.0 in | Wt 176.9 lb

## 2016-07-27 DIAGNOSIS — R310 Gross hematuria: Secondary | ICD-10-CM | POA: Diagnosis not present

## 2016-07-27 DIAGNOSIS — N2889 Other specified disorders of kidney and ureter: Secondary | ICD-10-CM

## 2016-07-27 DIAGNOSIS — N319 Neuromuscular dysfunction of bladder, unspecified: Secondary | ICD-10-CM | POA: Diagnosis not present

## 2016-07-27 DIAGNOSIS — R3915 Urgency of urination: Secondary | ICD-10-CM | POA: Diagnosis not present

## 2016-07-27 NOTE — Progress Notes (Signed)
07/27/2016 4:36 PM   Joseph Ritter 1941-09-03 [id number]  Referring provider: [name], MD [place] [facility] [place] [address]  Chief Complaint  Patient presents with  . Results    MRI report    HPI: Patient is 75 year old Caucasian male with the findings of a right renal mass seen on renal ultrasound performed on 06/21/2016 who underwent MRI for further evaluation and presents today to discuss those results.   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 UDS at [place] confirmed DHIC pattern with small contractions but inefficient emptying, PVR's >473ml. Manages with self cath about QID. Tried office BTX 100 IH and oral anticholinergics w/o help for urgency component which is minimal bother. No incontinence.  Recent Surveillance: 2017 RUS noted No hydronephrosis.  Echogenic kidneys, indicating nonspecific renal parenchymal disease of uncertain chronicity.  Mixed echogenicity focus in the interpolar right kidney, indeterminate for a right renal mass. Renal mass protocol MRI (preferred) or CT of the abdomen without and with IV contrast is recommended for further evaluation. Unremarkable bladder.    Recent creatinine 0.90 on 12/14/2015.  MRI performed on 07/07/2016 noted area of abnormal enhancement in the interpolar right kidney without a discrete mass lesion. Process appears more infiltrative such as can be seen in cases of pyelonephritis (segmental edema) and infiltrative neoplasm cannot be entirely excluded. CT scan without and with contrast may provide additional detail given that there is some motion degradation/obscuration on today's exam and CT scan has better temporal resolution. Followup recommended, and repeat MRI in 3 months may prove helpful to determine stability.  I have independently reviewed the films.  He has baseline symptomatologies of frequency,  urgency, nocturia, incontinence, intermittency, hesitancy, straining to urinate and a weak urinary stream.  Recently he has noted an increase in his urgency and nocturia.  He has to CIC up to 6 or 7 times a day.  He is feeling the urge to urinate, caths himself and then 20 minutes later he has the urgency again.  He caths again and the volume is miniscule.  He has not had gross hematuria or pain with cathing.  He has not had recent fevers, chills, nausea or vomiting.   His UA was positive for 3-10 RBC's and >30 WBC's.  His urine culture was negative.  Today, he complains of frequency, urgency, dysuria, nocturia, incontinence, intermittency, hesitancy, straining to urinate and gross hematuria with clots.  Urinalysis today noted greater than 30 RBCs per high-power field.  He denies fever, chills, nausea and vomiting.  Urine culture was negative.    PMH: Past Medical History:  Diagnosis Date  . Acid indigestion 03/09/2014  . Adult hypothyroidism 11/18/2012  . Anxiety   . Arteriosclerosis of coronary artery 11/18/2012   Overview:  PATENT LIMA TO LAD, PATENT SVG TO PDA AND OCCULUDED SVG TO OM2 BY CATHERIZATION 02/09/2009   . 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 Borderline diabetes 05/25/2014  . BP (high blood pressure) 11/18/2012  . BPH (benign prostatic hypertrophy)   . CAFL (chronic airflow limitation) ([place]) 05/03/2015  . CCF (congestive cardiac failure) ([place]) 01/03/2014   Overview:  HX OF   . Clinical depression 05/03/2015  . DDD (degenerative disc disease), lumbar 06/29/2014  . 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 Diabetes mellitus without complication ([place])   . Gastric ulcer 03/09/2014  . 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 History of urinary self-catheterization 2017  . HLD (hyperlipidemia) 01/03/2014  . Incomplete bladder emptying 11/18/2012  . Leg weakness  11/18/2012  . Lumbar radiculitis   . Neuritis or radiculitis due to rupture of lumbar intervertebral disc 06/29/2014  . Overactive bladder   . 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 Teeth problem    pt reports "bad teeth", "need to be pulled"    Surgical History: Past Surgical History:  Procedure Laterality Date  . ARTERY BIOPSY Right 12/16/2015   Procedure: BIOPSY TEMPORAL ARTERY;  Surgeon: Algernon Huxley, MD;  Location: [facility];  Service: Vascular;  Laterality: Right;  . CARDIAC CATHETERIZATION    . CATARACT EXTRACTION W/PHACO Left 01/26/2016   Procedure: CATARACT EXTRACTION PHACO AND INTRAOCULAR LENS PLACEMENT (IOC) LEFT EYE;  Surgeon: Leandrew Koyanagi, MD;  Location: [facility];  Service: Ophthalmology;  Laterality: Left;  . CORONARY ARTERY BYPASS GRAFT  04/07/2002   [facility]  . Cysto Bladder Botox Injection  07/02/2014  . EYE SURGERY Left    blood clot behind left eye  . HAND SURGERY Bilateral   . HERNIA REPAIR Right    inguinal hernia repair    Home Medications:    Medication List       Accurate as of 07/27/16  4:36 PM. Always use your most recent med list.          acetaminophen 500 MG tablet Commonly known as:  TYLENOL Take 500 mg by mouth every 6 (six) hours as needed.   aspirin EC 81 MG tablet Take 81 mg by mouth daily.   buPROPion 100 MG 12 hr tablet Commonly known as:  WELLBUTRIN SR TAKE ONE TABLET BY MOUTH ONCE A DAY   cefUROXime 500 MG tablet Commonly known as:  CEFTIN Take by mouth.   CENTRUM SILVER tablet Take 1 tablet by mouth daily.   CITRUCEL 500 MG Tabs Generic drug:  Methylcellulose (Laxative) Take 1 tablet by mouth daily.   cyclobenzaprine 10 MG tablet Commonly known as:  FLEXERIL Reported on 12/16/2015   ezetimibe 10 MG tablet Commonly known as:  ZETIA Take 10 mg by mouth daily.   HYDROcodone-acetaminophen 5-325 MG tablet Commonly known as:  NORCO Take 1 tablet by mouth every 6 (six) hours as needed for moderate pain.    ketoconazole 2 % shampoo Commonly known as:  NIZORAL APPLY SHAMPOO TO HAIR AS DIRECTED TWICE A WEEK   Lactobacillus Acidophilus Powd Take by mouth.   levothyroxine 100 MCG tablet Commonly known as:  SYNTHROID, LEVOTHROID 1 TABLET BY MOUTH EVERY MORNING FOR THYROID   polyethylene glycol powder powder Commonly known as:  GLYCOLAX/MIRALAX Take by mouth as needed.   RABEprazole 20 MG tablet Commonly known as:  ACIPHEX Take 20 mg by mouth daily.   ramipril 10 MG capsule Commonly known as:  ALTACE TAKE ONE CAPSULE BY MOUTH EVERY DAY   senna-docusate 8.6-50 MG tablet Commonly known as:  Senokot-S Take 1 tablet by mouth daily.   sertraline 100 MG tablet Commonly known as:  ZOLOFT TAKE 1 TABLET BY MOUTH ONCE A DAY   tamsulosin 0.4 MG Caps capsule Commonly known as:  FLOMAX Take 0.8 mg by mouth daily after supper.   traMADol 50 MG tablet Commonly known as:  ULTRAM 1 qid prn   VYTORIN 10-40 MG tablet Generic drug:  ezetimibe-simvastatin TAKE 1 TABLET BY MOUTH EVERY DAY AT BEDTIME  Allergies: No Known Allergies  Family History: Family History  Problem Relation Age of Onset  . Heart attack Mother   . Heart disease Father   . Prostate cancer Neg Hx   . Bladder Cancer Neg Hx   . Kidney cancer Neg Hx     Social History:  reports that he quit smoking about 14 years ago. His smoking use included Cigarettes. He has never used smokeless tobacco. He reports that he does not drink alcohol or use drugs.  ROS: UROLOGY Frequent Urination?: Yes Hard to postpone urination?: Yes Burning/pain with urination?: Yes Get up at night to urinate?: Yes Leakage of urine?: Yes Urine stream starts and stops?: Yes Trouble starting stream?: Yes Do you have to strain to urinate?: Yes Blood in urine?: Yes Urinary tract infection?: Yes Sexually transmitted disease?: No Injury to kidneys or bladder?: No Painful intercourse?: No Weak stream?: No Erection problems?: No Penile  pain?: No  Gastrointestinal Nausea?: No Vomiting?: No Indigestion/heartburn?: No Diarrhea?: No Constipation?: No  Constitutional Fever: No Night sweats?: No Weight loss?: No Fatigue?: No  Skin Skin rash/lesions?: No Itching?: No  Eyes Blurred vision?: No Double vision?: No  Ears/Nose/Throat Sore throat?: No Sinus problems?: No  Hematologic/Lymphatic Swollen glands?: No Easy bruising?: No  Cardiovascular Leg swelling?: No Chest pain?: No  Respiratory Cough?: No Shortness of breath?: No  Endocrine Excessive thirst?: No  Musculoskeletal Back pain?: Yes Joint pain?: Yes  Neurological Headaches?: No Dizziness?: No  Psychologic Depression?: Yes Anxiety?: Yes  Physical Exam: BP (!) 166/76   Pulse 76   Ht 5\' 6"  (1.676 m)   Wt 176 lb 14.4 oz (80.2 kg)   BMI 28.55 kg/m   Constitutional: Well nourished. Alert and oriented, No acute distress. HEENT: [place] AT, moist mucus membranes. Trachea midline, no masses. Cardiovascular: No clubbing, cyanosis, or edema. Respiratory: Normal respiratory effort, no increased work of breathing. Skin: No rashes, bruises or suspicious lesions. Lymph: No cervical or inguinal adenopathy. Neurologic: Grossly intact, no focal deficits, moving all 4 extremities. Psychiatric: Normal mood and affect.  Laboratory Data: Lab Results  Component Value Date   WBC 9.3 12/14/2015   HGB 14.3 12/14/2015   HCT 42.5 12/14/2015   MCV 88.1 12/14/2015   PLT 186 12/14/2015    Lab Results  Component Value Date   CREATININE 0.92 06/26/2016    Urinalysis Significant for > 30 RBC's/hpf.  See EPIC.    Pertinent Imaging: CLINICAL DATA:  Indeterminate right renal mass seen on ultrasound.  EXAM: MRI ABDOMEN WITHOUT AND WITH CONTRAST  TECHNIQUE: Multiplanar multisequence MR imaging of the abdomen was performed both before and after the administration of intravenous contrast.  CONTRAST:  42mL MULTIHANCE GADOBENATE DIMEGLUMINE 529  MG/ML IV SOLN  COMPARISON:  Ultrasound exam 06/21/2016.  CT scan from 10/11/2010.  FINDINGS: Lower chest:  Unremarkable.  Hepatobiliary: Tiny cysts, potentially cavernous hemangioma identified inferior right liver. Liver parenchyma otherwise unremarkable. There is no evidence for gallstones, gallbladder wall thickening, or pericholecystic fluid. No intrahepatic or extrahepatic biliary dilation.  Pancreas: No focal mass lesion. No dilatation of the main duct. No intraparenchymal cyst. No peripancreatic edema.  Spleen: No splenomegaly. No focal mass lesion.  Adrenals/Urinary Tract: No adrenal nodule or mass.  There is an area of altered perfusion in the interpolar right kidney (see images 24 and 27 series 9) with loss of cortical medullary differentiation. This becomes more normal in appearance on later postcontrast sequences. Cystic changes identified in the same region, best seen on T2 weighted pulse sequences (  see images 9 and 12 of series 7). No discrete mass can be identified.  Tiny cortical cyst noted left kidney.  Stomach/Bowel: Stomach is nondistended. No gastric wall thickening. No evidence of outlet obstruction. Duodenum is normally positioned as is the ligament of Treitz. No small bowel wall thickening. No small bowel dilatation. No evidence for colonic dilatation.  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 There is no gastrohepatic or hepatoduodenal ligament lymphadenopathy. No intraperitoneal or retroperitoneal lymphadenopathy.  Other: No intraperitoneal free fluid.  Musculoskeletal: No abnormal marrow signal within the visualized bony anatomy.  IMPRESSION: 1. Area of abnormal enhancement in the interpolar right kidney without a discrete mass lesion. Process appears more infiltrative such as can be seen in cases of pyelonephritis (segmental edema)  and infiltrative neoplasm cannot be entirely excluded. CT scan without and with contrast may provide additional detail given that there is some motion degradation/obscuration on today's exam and CT scan has better temporal resolution. Followup recommended, and repeat MRI in 3 months may prove helpful to determine stability   Electronically Signed   By: Misty Stanley M.D.   On: 07/07/2016 13:16   Assessment & Plan:    1. Right renal mass  - MRI did not give further clarification  - Patient is having gross hematuria with clots, will pursue CT Urogram at this time  2. Urgency  - worsening  - urine culture was negative  - cystoscopy for hematuria workup is pending  3. Neurogenic bladder  - Continue CIC- no refills needed at this time  - Continue tamsulosin and Vesicare  4. Gross hematuria   - I explained to the patient that there are a number of causes that can be associated with blood in the urine, such as stones, BPH, UTI's, damage to the urinary tract and/or cancer.  - At this time, I felt that the patient warranted further urologic evaluation.   The AUA guidelines state that a CT urogram is the preferred imaging study to evaluate hematuria.  - I explained to the patient that a contrast material will be injected into a vein and that in rare instances, an allergic reaction can result and may even life threatening   The patient denies any allergies to contrast, iodine and/or seafood and is not taking metformin.  - Following the imaging study,  I've recommended a cystoscopy. I described how this is performed, typically in an office setting with a flexible cystoscope. We described the risks, benefits, and possible side effects, the most common of which is a minor amount of blood in the urine and/or burning which usually resolves in 24 to 48 hours.    - The patient had the opportunity to ask questions which were answered. Based upon this discussion, the patient is willing to proceed.  Therefore, I've ordered: a CT Urogram and cystoscopy.  - He will return following all of the above for discussion of the results.     Return for CT Urogram report and cystoscopy.  These notes generated with voice recognition software. I apologize for typographical errors.  Zara Council, [facility] [address] [place],  [address] [phone]

## 2016-07-28 LAB — MICROSCOPIC EXAMINATION: Bacteria, UA: NONE SEEN

## 2016-07-28 LAB — BUN+CREAT
BUN/Creatinine Ratio: 15 (ref 10–24)
BUN: 12 mg/dL (ref 8–27)
Creatinine, Ser: 0.8 mg/dL (ref 0.76–1.27)
GFR calc non Af Amer: 89 mL/min/{1.73_m2} (ref >59)
GFR, EST AFRICAN AMERICAN: 102 mL/min/{1.73_m2} (ref >59)

## 2016-07-28 LAB — URINALYSIS, COMPLETE
BILIRUBIN UA: NEGATIVE
Glucose, UA: NEGATIVE
KETONES UA: NEGATIVE
Leukocytes, UA: NEGATIVE
Nitrite, UA: NEGATIVE
PH UA: 5.5 (ref 5.0–7.5)
Specific Gravity, UA: 1.015 (ref 1.005–1.030)
UUROB: 0.2 mg/dL (ref 0.2–1.0)

## 2016-08-09 ENCOUNTER — Ambulatory Visit
Admission: RE | Admit: 2016-08-09 | Discharge: 2016-08-09 | Disposition: A | Discharge status: Home / Self Care | Payer: Medicare Other | Source: Ambulatory Visit | Attending: Urology | Admitting: Urology

## 2016-08-09 DIAGNOSIS — N2889 Other specified disorders of kidney and ureter: Secondary | ICD-10-CM | POA: Insufficient documentation

## 2016-08-09 DIAGNOSIS — K449 Diaphragmatic hernia without obstruction or gangrene: Secondary | ICD-10-CM | POA: Insufficient documentation

## 2016-08-09 DIAGNOSIS — I700 Atherosclerosis of aorta: Secondary | ICD-10-CM | POA: Insufficient documentation

## 2016-08-09 DIAGNOSIS — N12 Tubulo-interstitial nephritis, not specified as acute or chronic: Secondary | ICD-10-CM | POA: Diagnosis not present

## 2016-08-09 MED ORDER — IOPAMIDOL (ISOVUE-300) INJECTION 61%
125.0000 mL | Freq: Once | INTRAVENOUS | Status: AC | PRN
  Administered 2016-08-09: 125 mL via INTRAVENOUS

## 2016-08-15 ENCOUNTER — Ambulatory Visit (INDEPENDENT_AMBULATORY_CARE_PROVIDER_SITE_OTHER): Payer: Medicare Other | Admitting: Urology

## 2016-08-15 ENCOUNTER — Encounter: Payer: Self-pay | Admitting: Urology

## 2016-08-15 VITALS — BP 135/69 | HR 81 | Ht 66.0 in | Wt 173.6 lb

## 2016-08-15 DIAGNOSIS — N3001 Acute cystitis with hematuria: Secondary | ICD-10-CM | POA: Diagnosis not present

## 2016-08-15 DIAGNOSIS — N2889 Other specified disorders of kidney and ureter: Secondary | ICD-10-CM | POA: Diagnosis not present

## 2016-08-15 DIAGNOSIS — R300 Dysuria: Secondary | ICD-10-CM | POA: Diagnosis not present

## 2016-08-15 DIAGNOSIS — R310 Gross hematuria: Secondary | ICD-10-CM

## 2016-08-15 LAB — URINALYSIS, COMPLETE
Bilirubin, UA: NEGATIVE
Glucose, UA: NEGATIVE
Ketones, UA: NEGATIVE
NITRITE UA: NEGATIVE
PH UA: 5.5 (ref 5.0–7.5)
Specific Gravity, UA: 1.015 (ref 1.005–1.030)
UUROB: 0.2 mg/dL (ref 0.2–1.0)

## 2016-08-15 LAB — MICROSCOPIC EXAMINATION: Epithelial Cells (non renal): NONE SEEN /hpf (ref 0–10)

## 2016-08-15 MED ORDER — LIDOCAINE HCL 2 % EX GEL: 1.0000 "application " | Freq: Once | CUTANEOUS | Status: DC

## 2016-08-15 MED ORDER — CEFPODOXIME PROXETIL 200 MG PO TABS: 200.0000 mg | ORAL_TABLET | Freq: Two times a day (BID) | ORAL | 0 refills | Status: DC

## 2016-08-15 MED ORDER — CIPROFLOXACIN HCL 500 MG PO TABS
500.0000 mg | ORAL_TABLET | Freq: Once | ORAL | Status: DC
Start: 2016-08-15 — End: 2016-08-15

## 2016-08-15 NOTE — Progress Notes (Signed)
08/15/2016 3:04 PM   Mable Thedford Scripter 01-13-1941 [id number]  Referring provider: [name], MD [place] [facility] [place] [address]  Chief Complaint  Patient presents with  . Cysto    Renal mass     HPI: Mr Joseph Ritter is a 75yo with gross hematuria here for cystoscopy. He developed dysuria for the past 1 month which has been worsening. He had gross hematuria starting 1 week ago and it stopped yesterday. UA today is concerning for infection. He denies any other LUTS.  CT hematuria scan showed right pyelonephritis, right ureteral enhancement and standing around the bladder concerning for cystitis.   NO other associated symtpoms. No exacerbating/alleviating events   PMH: Past Medical History:  Diagnosis Date  . Acid indigestion 03/09/2014  . Adult hypothyroidism 11/18/2012  . Anxiety   . Arteriosclerosis of coronary artery 11/18/2012   Overview:  PATENT LIMA TO LAD, PATENT SVG TO PDA AND OCCULUDED SVG TO OM2 BY CATHERIZATION 02/09/2009   . 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 Borderline diabetes 05/25/2014  . BP (high blood pressure) 11/18/2012  . BPH (benign prostatic hypertrophy)   . CAFL (chronic airflow limitation) ([employer]) 05/03/2015  . CCF (congestive cardiac failure) ([place]) 01/03/2014   Overview:  HX OF   . Clinical depression 05/03/2015  . DDD (degenerative disc disease), lumbar 06/29/2014  . 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 Diabetes mellitus without complication ([place])   . Gastric ulcer 03/09/2014  . 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 History of urinary self-catheterization 2017  . HLD (hyperlipidemia) 01/03/2014  . Incomplete bladder emptying 11/18/2012  . Leg weakness 11/18/2012  . Lumbar radiculitis   . Neuritis or radiculitis due to rupture of lumbar intervertebral disc 06/29/2014  .  Overactive bladder   . 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 Teeth problem    pt reports "bad teeth", "need to be pulled"    Surgical History: Past Surgical History:  Procedure Laterality Date  . ARTERY BIOPSY Right 12/16/2015   Procedure: BIOPSY TEMPORAL ARTERY;  Surgeon: Algernon Huxley, MD;  Location: [facility];  Service: Vascular;  Laterality: Right;  . CARDIAC CATHETERIZATION    . CATARACT EXTRACTION W/PHACO Left 01/26/2016   Procedure: CATARACT EXTRACTION PHACO AND INTRAOCULAR LENS PLACEMENT (IOC) LEFT EYE;  Surgeon: Leandrew Koyanagi, MD;  Location: [facility];  Service: Ophthalmology;  Laterality: Left;  . CORONARY ARTERY BYPASS GRAFT  04/07/2002   [facility]  . Cysto Bladder Botox Injection  07/02/2014  . EYE SURGERY Left    blood clot behind left eye  . HAND SURGERY Bilateral   . HERNIA REPAIR Right    inguinal hernia repair    Home Medications:    Medication List       Accurate as of 08/15/16  3:04 PM. Always use your most recent med list.          acetaminophen 500 MG tablet Commonly known as:  TYLENOL Take 500 mg by mouth every 6 (six) hours as needed.   aspirin EC 81 MG tablet Take 81 mg by mouth daily.   buPROPion 100 MG 12 hr tablet Commonly known as:  WELLBUTRIN SR TAKE ONE TABLET BY MOUTH ONCE A DAY   CENTRUM SILVER tablet Take 1 tablet by mouth daily.   CITRUCEL 500 MG Tabs Generic drug:  Methylcellulose (Laxative) Take 1 tablet by mouth daily.   cyclobenzaprine 10 MG tablet Commonly known as:  FLEXERIL Reported on 12/16/2015   ketoconazole 2 % shampoo Commonly known as:  NIZORAL APPLY SHAMPOO TO HAIR AS DIRECTED TWICE A WEEK   Lactobacillus Acidophilus Powd Take by mouth.   levothyroxine 100 MCG tablet Commonly known as:  SYNTHROID, LEVOTHROID 1 TABLET BY MOUTH EVERY MORNING FOR THYROID   polyethylene glycol powder powder Commonly known as:  GLYCOLAX/MIRALAX Take by mouth as needed.   RABEprazole 20 MG tablet Commonly  known as:  ACIPHEX Take 20 mg by mouth daily.   ramipril 10 MG capsule Commonly known as:  ALTACE TAKE ONE CAPSULE BY MOUTH EVERY DAY   senna-docusate 8.6-50 MG tablet Commonly known as:  Senokot-S Take 1 tablet by mouth daily.   sertraline 100 MG tablet Commonly known as:  ZOLOFT TAKE 1 TABLET BY MOUTH ONCE A DAY   tamsulosin 0.4 MG Caps capsule Commonly known as:  FLOMAX Take 0.8 mg by mouth daily after supper.   traMADol 50 MG tablet Commonly known as:  ULTRAM 1 qid prn   VYTORIN 10-40 MG tablet Generic drug:  ezetimibe-simvastatin TAKE 1 TABLET BY MOUTH EVERY DAY AT BEDTIME       Allergies: No Known Allergies  Family History: Family History  Problem Relation Age of Onset  . Heart attack Mother   . Heart disease Father   . Prostate cancer Neg Hx   . Bladder Cancer Neg Hx   . Kidney cancer Neg Hx     Social History:  reports that he quit smoking about 14 years ago. His smoking use included Cigarettes. He has never used smokeless tobacco. He reports that he does not drink alcohol or use drugs.  ROS:                                        Physical Exam: BP 135/69   Pulse 81   Ht 5\' 6"  (1.676 m)   Wt 78.7 kg (173 lb 9.6 oz)   BMI 28.02 kg/m   Constitutional:  Alert and oriented, No acute distress. HEENT: [place] AT, moist mucus membranes.  Trachea midline, no masses. Cardiovascular: No clubbing, cyanosis, or edema. Respiratory: Normal respiratory effort, no increased work of breathing. GI: Abdomen is soft, nontender, nondistended, no abdominal masses GU: No CVA tenderness.  Skin: No rashes, bruises or suspicious lesions. Lymph: No cervical or inguinal adenopathy. Neurologic: Grossly intact, no focal deficits, moving all 4 extremities. Psychiatric: Normal mood and affect.  Laboratory Data: Lab Results  Component Value Date   WBC 9.3 12/14/2015   HGB 14.3 12/14/2015   HCT 42.5 12/14/2015   MCV 88.1 12/14/2015   PLT 186 12/14/2015     Lab Results  Component Value Date   CREATININE 0.80 07/27/2016    No results found for: PSA  No results found for: TESTOSTERONE  No results found for: HGBA1C  Urinalysis    Component Value Date/Time   APPEARANCEUR Cloudy (A) 07/27/2016 1541   GLUCOSEU Negative 07/27/2016 1541   BILIRUBINUR Negative 07/27/2016 1541   PROTEINUR 2+ (A) 07/27/2016 1541   NITRITE Negative 07/27/2016 1541   LEUKOCYTESUR Negative 07/27/2016 1541    Pertinent Imaging: none  Assessment & Plan:    1. Renal mass -likely pyelonephritis, will reassess with CT in 2 months - Urinalysis, Complete - ciprofloxacin (CIPRO) tablet 500 mg; Take  1 tablet (500 mg total) by mouth once. - lidocaine (XYLOCAINE) 2 % jelly 1 application; Place 1 application into the urethra once.  2. Gross hematuria -Will proceed with hematuria workup after 2 week course of culture specific antibiotics. -Urine for culture -Vantin 200mg  BID for 14 days - Urinalysis, Complete - ciprofloxacin (CIPRO) tablet 500 mg; Take 1 tablet (500 mg total) by mouth once. - lidocaine (XYLOCAINE) 2 % jelly 1 application; Place 1 application into the urethra once.   No Follow-up on file.  Nicolette Bang, MD  [facility] [address], [place], [place] [address] [phone]

## 2016-08-18 LAB — CULTURE, URINE COMPREHENSIVE

## 2016-08-29 ENCOUNTER — Ambulatory Visit (INDEPENDENT_AMBULATORY_CARE_PROVIDER_SITE_OTHER): Payer: Medicare Other | Admitting: Urology

## 2016-08-29 VITALS — BP 174/75 | HR 84 | Ht 66.0 in | Wt 175.0 lb

## 2016-08-29 DIAGNOSIS — N2889 Other specified disorders of kidney and ureter: Secondary | ICD-10-CM

## 2016-08-29 DIAGNOSIS — R310 Gross hematuria: Secondary | ICD-10-CM

## 2016-08-29 DIAGNOSIS — N319 Neuromuscular dysfunction of bladder, unspecified: Secondary | ICD-10-CM

## 2016-08-29 LAB — URINALYSIS, COMPLETE
BILIRUBIN UA: NEGATIVE
GLUCOSE, UA: NEGATIVE
KETONES UA: NEGATIVE
Leukocytes, UA: NEGATIVE
Nitrite, UA: NEGATIVE
PROTEIN UA: NEGATIVE
UUROB: 0.2 mg/dL (ref 0.2–1.0)
pH, UA: 6 (ref 5.0–7.5)

## 2016-08-29 LAB — MICROSCOPIC EXAMINATION
Bacteria, UA: NONE SEEN
Epithelial Cells (non renal): NONE SEEN /hpf (ref 0–10)

## 2016-08-29 MED ORDER — LIDOCAINE HCL 2 % EX GEL
1.0000 "application " | Freq: Once | CUTANEOUS | Status: AC
  Administered 2016-08-29: 1 via URETHRAL

## 2016-08-29 MED ORDER — CIPROFLOXACIN HCL 500 MG PO TABS
500.0000 mg | ORAL_TABLET | Freq: Once | ORAL | Status: AC
  Administered 2016-08-29: 500 mg via ORAL

## 2016-08-29 NOTE — Progress Notes (Signed)
08/29/2016 12:07 PM   Joseph Ritter 16-Sep-1941 [id number]  Referring provider: [name], MD [place] [facility] [place] [address]  Chief Complaint  Patient presents with  . Cysto    HPI: [name] is a 75yo here for cystoscopy for gross hematuria. He took 2 weeks of Vantin which resolved his right flank and suprapubic pain. UA is normal today. He has a mass in his right kidney that has been stable for 2 months and likely represents pyelonephritis. Gross hematuria resolved after antibiotics. He catheterizes 1-2x daily   PMH: Past Medical History:  Diagnosis Date  . Acid indigestion 03/09/2014  . Adult hypothyroidism 11/18/2012  . Anxiety   . Arteriosclerosis of coronary artery 11/18/2012   Overview:  PATENT LIMA TO LAD, PATENT SVG TO PDA AND OCCULUDED SVG TO OM2 BY CATHERIZATION 02/09/2009   . 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 Borderline diabetes 05/25/2014  . BP (high blood pressure) 11/18/2012  . BPH (benign prostatic hypertrophy)   . CAFL (chronic airflow limitation) ([place]) 05/03/2015  . CCF (congestive cardiac failure) ([place]) 01/03/2014   Overview:  HX OF   . Clinical depression 05/03/2015  . DDD (degenerative disc disease), lumbar 06/29/2014  . 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 Diabetes mellitus without complication ([place])   . Gastric ulcer 03/09/2014  . 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 History of urinary self-catheterization 2017  . HLD (hyperlipidemia) 01/03/2014  . Incomplete bladder emptying 11/18/2012  . Leg weakness 11/18/2012  . Lumbar radiculitis   . Neuritis or radiculitis due to rupture of lumbar intervertebral disc 06/29/2014  . Overactive bladder   . 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 Teeth problem    pt reports "bad teeth", "need to be pulled"    Surgical  History: Past Surgical History:  Procedure Laterality Date  . ARTERY BIOPSY Right 12/16/2015   Procedure: BIOPSY TEMPORAL ARTERY;  Surgeon: Algernon Huxley, MD;  Location: [facility];  Service: Vascular;  Laterality: Right;  . CARDIAC CATHETERIZATION    . CATARACT EXTRACTION W/PHACO Left 01/26/2016   Procedure: CATARACT EXTRACTION PHACO AND INTRAOCULAR LENS PLACEMENT (IOC) LEFT EYE;  Surgeon: Leandrew Koyanagi, MD;  Location: [place];  Service: Ophthalmology;  Laterality: Left;  . CORONARY ARTERY BYPASS GRAFT  04/07/2002   [facility]  . Cysto Bladder Botox Injection  07/02/2014  . EYE SURGERY Left    blood clot behind left eye  . HAND SURGERY Bilateral   . HERNIA REPAIR Right    inguinal hernia repair    Home Medications:    Medication List       Accurate as of 08/29/16 12:07 PM. Always use your most recent med list.          acetaminophen 500 MG tablet Commonly known as:  TYLENOL Take 500 mg by mouth every 6 (six) hours as needed.   aspirin EC 81 MG tablet Take 81 mg by mouth daily.   buPROPion 100 MG 12 hr tablet Commonly known as:  WELLBUTRIN SR TAKE ONE TABLET BY MOUTH ONCE A DAY   cefpodoxime 200 MG tablet Commonly known as:  VANTIN Take 1 tablet (200 mg total) by mouth 2 (two) times daily.   CENTRUM SILVER tablet Take 1 tablet by mouth daily.   CITRUCEL 500 MG Tabs Generic drug:  Methylcellulose (Laxative) Take 1 tablet by mouth daily.   cyclobenzaprine 10 MG tablet Commonly known as:  FLEXERIL Reported on 12/16/2015   ketoconazole 2 % shampoo Commonly known as:  NIZORAL APPLY SHAMPOO TO HAIR AS DIRECTED TWICE A WEEK   Lactobacillus Acidophilus Powd Take by mouth.   levothyroxine 100 MCG tablet Commonly known as:  SYNTHROID, LEVOTHROID 1 TABLET BY MOUTH EVERY MORNING FOR THYROID   polyethylene glycol powder powder Commonly known as:  GLYCOLAX/MIRALAX Take by mouth as needed.   RABEprazole 20 MG tablet Commonly known as:  ACIPHEX Take 20 mg by  mouth daily.   ramipril 10 MG capsule Commonly known as:  ALTACE TAKE ONE CAPSULE BY MOUTH EVERY DAY   senna-docusate 8.6-50 MG tablet Commonly known as:  Senokot-S Take 1 tablet by mouth daily.   sertraline 100 MG tablet Commonly known as:  ZOLOFT TAKE 1 TABLET BY MOUTH ONCE A DAY   tamsulosin 0.4 MG Caps capsule Commonly known as:  FLOMAX Take 0.8 mg by mouth daily after supper.   traMADol 50 MG tablet Commonly known as:  ULTRAM 1 qid prn   VYTORIN 10-40 MG tablet Generic drug:  ezetimibe-simvastatin TAKE 1 TABLET BY MOUTH EVERY DAY AT BEDTIME       Allergies: No Known Allergies  Family History: Family History  Problem Relation Age of Onset  . Heart attack Mother   . Heart disease Father   . Prostate cancer Neg Hx   . Bladder Cancer Neg Hx   . Kidney cancer Neg Hx     Social History:  reports that he quit smoking about 14 years ago. His smoking use included Cigarettes. He has never used smokeless tobacco. He reports that he does not drink alcohol or use drugs.  ROS:                                        Physical Exam: BP (!) 174/75   Pulse 84   Ht 5\' 6"  (1.676 m)   Wt 79.4 kg (175 lb)   BMI 28.25 kg/m   Constitutional:  Alert and oriented, No acute distress. HEENT: [place] AT, moist mucus membranes.  Trachea midline, no masses. Cardiovascular: No clubbing, cyanosis, or edema. Respiratory: Normal respiratory effort, no increased work of breathing. GI: Abdomen is soft, nontender, nondistended, no abdominal masses GU: No CVA tenderness.  Skin: No rashes, bruises or suspicious lesions. Lymph: No cervical or inguinal adenopathy. Neurologic: Grossly intact, no focal deficits, moving all 4 extremities. Psychiatric: Normal mood and affect.  Laboratory Data: Lab Results  Component Value Date   WBC 9.3 12/14/2015   HGB 14.3 12/14/2015   HCT 42.5 12/14/2015   MCV 88.1 12/14/2015   PLT 186 12/14/2015    Lab Results  Component Value  Date   CREATININE 0.80 07/27/2016    No results found for: PSA  No results found for: TESTOSTERONE  No results found for: HGBA1C  Urinalysis    Component Value Date/Time   APPEARANCEUR Cloudy (A) 08/15/2016 1440   GLUCOSEU Negative 08/15/2016 1440   BILIRUBINUR Negative 08/15/2016 1440   PROTEINUR Trace (A) 08/15/2016 1440   NITRITE Negative 08/15/2016 1440   LEUKOCYTESUR 3+ (A) 08/15/2016 1440     08/29/16  CC:  Chief Complaint  Patient presents with  . Cysto    HPI:  Blood pressure (!) 174/75, pulse 84, height 5\' 6"  (1.676 m), weight 79.4  kg (175 lb). NED. A&Ox3.   No respiratory distress   Abd soft, NT, ND Normal phallus with bilateral descended testicles  Cystoscopy Procedure Note  Patient identification was confirmed, informed consent was obtained, and patient was prepped using Betadine solution.  Lidocaine jelly was administered per urethral meatus.    Preoperative abx where received prior to procedure.     Pre-Procedure: - Inspection reveals a normal caliber ureteral meatus.  Procedure: The flexible cystoscope was introduced without difficulty - No urethral strictures/lesions are present. - Enlarged prostate  - Normal bladder neck - Bilateral ureteral orifices identified - Bladder mucosa  reveals no ulcers, tumors, or lesions - No bladder stones - No trabeculation  Retroflexion shows no intravesical prostatic protrusion   Post-Procedure: - Patient tolerated the procedure well  Assessment/ Plan:   Pertinent Imaging: CT abd/pelvis  Assessment & Plan:    1. Bladder neurogenesis -continue CIC  2. Gross hematuria -RTC 6 months with CT scan for Right renal mass imaging. Mass was likely related to pyelonephritis and should resolve on repeat imaging - Urinalysis, Complete - ciprofloxacin (CIPRO) tablet 500 mg; Take 1 tablet (500 mg total) by mouth once. - lidocaine (XYLOCAINE) 2 % jelly 1 application; Place 1 application into the urethra  once.   No Follow-up on file.  Nicolette Bang, MD  [facility] [address] [place], [place] [address] [phone]

## 2017-02-08 ENCOUNTER — Other Ambulatory Visit: Payer: Self-pay

## 2017-02-08 DIAGNOSIS — R310 Gross hematuria: Secondary | ICD-10-CM

## 2017-02-09 ENCOUNTER — Ambulatory Visit
Admission: RE | Admit: 2017-02-09 | Discharge: 2017-02-09 | Disposition: A | Discharge status: Home / Self Care | Payer: Medicare Other | Source: Ambulatory Visit | Attending: Urology | Admitting: Urology

## 2017-02-09 ENCOUNTER — Other Ambulatory Visit
Admission: RE | Admit: 2017-02-09 | Discharge: 2017-02-09 | Disposition: A | Discharge status: Home / Self Care | Payer: Medicare Other | Source: Ambulatory Visit | Attending: Urology | Admitting: Urology

## 2017-02-09 DIAGNOSIS — R310 Gross hematuria: Secondary | ICD-10-CM

## 2017-02-09 DIAGNOSIS — N400 Enlarged prostate without lower urinary tract symptoms: Secondary | ICD-10-CM | POA: Diagnosis not present

## 2017-02-09 DIAGNOSIS — K5730 Diverticulosis of large intestine without perforation or abscess without bleeding: Secondary | ICD-10-CM | POA: Insufficient documentation

## 2017-02-09 DIAGNOSIS — N2889 Other specified disorders of kidney and ureter: Secondary | ICD-10-CM | POA: Insufficient documentation

## 2017-02-09 DIAGNOSIS — K4090 Unilateral inguinal hernia, without obstruction or gangrene, not specified as recurrent: Secondary | ICD-10-CM | POA: Insufficient documentation

## 2017-02-09 DIAGNOSIS — I714 Abdominal aortic aneurysm, without rupture: Secondary | ICD-10-CM | POA: Diagnosis not present

## 2017-02-09 LAB — CREATININE, SERUM
CREATININE: 1.19 mg/dL (ref 0.61–1.24)
GFR calc Af Amer: >60 mL/min (ref >60)
GFR calc non Af Amer: 58 mL/min — ABNORMAL LOW (ref >60)

## 2017-02-09 LAB — BUN: BUN: 18 mg/dL (ref 6–20)

## 2017-02-09 MED ORDER — IOPAMIDOL (ISOVUE-300) INJECTION 61%
125.0000 mL | Freq: Once | INTRAVENOUS | Status: AC | PRN
  Administered 2017-02-09: 125 mL via INTRAVENOUS

## 2017-02-23 ENCOUNTER — Ambulatory Visit: Payer: Medicare Other

## 2017-02-27 ENCOUNTER — Ambulatory Visit (INDEPENDENT_AMBULATORY_CARE_PROVIDER_SITE_OTHER): Payer: Medicare Other | Admitting: Urology

## 2017-02-27 ENCOUNTER — Encounter: Payer: Self-pay | Admitting: Urology

## 2017-02-27 VITALS — BP 120/61 | HR 88 | Ht 66.0 in | Wt 165.5 lb

## 2017-02-27 DIAGNOSIS — N2889 Other specified disorders of kidney and ureter: Secondary | ICD-10-CM | POA: Diagnosis not present

## 2017-02-27 DIAGNOSIS — I82220 Acute embolism and thrombosis of inferior vena cava: Secondary | ICD-10-CM

## 2017-02-27 DIAGNOSIS — R310 Gross hematuria: Secondary | ICD-10-CM

## 2017-02-27 DIAGNOSIS — N3281 Overactive bladder: Secondary | ICD-10-CM | POA: Diagnosis not present

## 2017-02-27 DIAGNOSIS — D49511 Neoplasm of unspecified behavior of right kidney: Secondary | ICD-10-CM | POA: Insufficient documentation

## 2017-02-27 MED ORDER — CEPHALEXIN 500 MG PO CAPS: 500.0000 mg | ORAL_CAPSULE | Freq: Three times a day (TID) | ORAL | 0 refills | Status: AC

## 2017-02-27 NOTE — Progress Notes (Signed)
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 [facility] pattern[facility] with small contractions but ineficient emptying, PVR's >466ml. Manages with self cath about QID. Tried ofice BTX 100 IH and oral anticholinergics w/o help for urgency component which is minimal bother. No incontinence.  Recent Surveaillance: 2016 - [place] no hydro, Cr <1.0.  2017 - CT ? Rt renal mass, Cr. <1.5 2018 - CT Enlarging Rt renal mass, Cr 1.19  2 - Gross Hematuria - gross hematuria 08/2016. CT with ? Endophytic right renal mass v. Pyelo, Cysto 08/2016 unremakrable.  3 - Enlarging Endophytic Right Renal Mass - endophytic hypodense mass by CT 08/2016 on eval hematuria, felt likely resolving pyelo. Repeat imaging 01/2017 much more concerning with enlarging endophytic mass with right upper pole hydro and peri-renal neovascularity. No adenopathy. 3 artery / 2 vein right renovascular anatomy.  Today "Joseph Ritter" is seen in f/u above and CT which unfortunatley shows progression of right renal mass.     PMH: Past Medical History:  Diagnosis Date  . Acid indigestion 03/09/2014  . Adult hypothyroidism 11/18/2012  . Anxiety   . Arteriosclerosis of coronary artery 11/18/2012   Overview:  PATENT LIMA TO LAD, PATENT SVG TO PDA AND OCCULUDED SVG TO OM2 BY CATHERIZATION 02/09/2009   . 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 Borderline diabetes 05/25/2014  . BP (high blood pressure) 11/18/2012  . BPH (benign prostatic hypertrophy)   . CAFL (chronic airflow limitation) ([place]) 05/03/2015  . CCF (congestive cardiac failure) ([place]) 01/03/2014   Overview:  HX OF   . Clinical depression 05/03/2015  .  DDD (degenerative disc disease), lumbar 06/29/2014  . 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 Diabetes mellitus without complication ([place])   . Gastric ulcer 03/09/2014  . 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 History of urinary self-catheterization 2017  . HLD (hyperlipidemia) 01/03/2014  . Incomplete bladder emptying 11/18/2012  . Leg weakness 11/18/2012  . Lumbar radiculitis   . Neuritis or radiculitis due to rupture of lumbar intervertebral disc 06/29/2014  . Overactive bladder   . 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 Teeth problem    pt reports "bad teeth", "need to be pulled"    Surgical History: Past Surgical History:  Procedure Laterality Date  . ARTERY BIOPSY Right 12/16/2015   Procedure: BIOPSY TEMPORAL ARTERY;  Surgeon: Algernon Huxley, MD;  Location: [facility] OR[facility];  Service: Vascular;  Laterality: Right;  . CARDIAC CATHETERIZATION    . CATARACT EXTRACTION W/PHACO Left 01/26/2016   Procedure: CATARACT EXTRACTION PHACO AND INTRAOCULAR LENS PLACEMENT (IOC) LEFT EYE;  Surgeon: Leandrew Koyanagi, MD;  Location: [place];  Service: Ophthalmology;  Laterality: Left;  . CORONARY ARTERY BYPASS GRAFT  04/07/2002   [facility]  . Cysto Bladder Botox Injection  07/02/2014  . EYE SURGERY Left    blood clot behind left eye  . HAND SURGERY Bilateral   . HERNIA REPAIR Right    inguinal hernia repair    Home Medications:  Allergies as of 02/27/2017   No Known Allergies     Medication List  Accurate as of 02/27/17  8:19 AM. Always use your most recent med list.          acetaminophen 500 MG tablet Commonly known as:  TYLENOL Take 500 mg by mouth every 6 (six) hours as needed.   aspirin EC 81 MG tablet Take 81 mg by mouth daily.   buPROPion 100 MG 12 hr tablet Commonly known as:  WELLBUTRIN SR TAKE ONE TABLET BY MOUTH ONCE A DAY   cefpodoxime 200 MG tablet Commonly  known as:  VANTIN Take 1 tablet (200 mg total) by mouth 2 (two) times daily.   CENTRUM SILVER tablet Take 1 tablet by mouth daily.   CITRUCEL 500 MG Tabs Generic drug:  Methylcellulose (Laxative) Take 1 tablet by mouth daily.   cyclobenzaprine 10 MG tablet Commonly known as:  FLEXERIL Reported on 12/16/2015   ketoconazole 2 % shampoo Commonly known as:  NIZORAL APPLY SHAMPOO TO HAIR AS DIRECTED TWICE A WEEK   Lactobacillus Acidophilus Powd Take by mouth.   levothyroxine 100 MCG tablet Commonly known as:  SYNTHROID, LEVOTHROID 1 TABLET BY MOUTH EVERY MORNING FOR THYROID   polyethylene glycol powder powder Commonly known as:  GLYCOLAX/MIRALAX Take by mouth as needed.   RABEprazole 20 MG tablet Commonly known as:  ACIPHEX Take 20 mg by mouth daily.   ramipril 10 MG capsule Commonly known as:  ALTACE TAKE ONE CAPSULE BY MOUTH EVERY DAY   senna-docusate 8.6-50 MG tablet Commonly known as:  Senokot-S Take 1 tablet by mouth daily.   sertraline 100 MG tablet Commonly known as:  ZOLOFT TAKE 1 TABLET BY MOUTH ONCE A DAY   tamsulosin 0.4 MG Caps capsule Commonly known as:  FLOMAX Take 0.8 mg by mouth daily after supper.   traMADol 50 MG tablet Commonly known as:  ULTRAM 1 qid prn   VYTORIN 10-40 MG tablet Generic drug:  ezetimibe-simvastatin TAKE 1 TABLET BY MOUTH EVERY DAY AT BEDTIME       Allergies: No Known Allergies  Family History: Family History  Problem Relation Age of Onset  . Heart attack Mother   . Heart disease Father   . Prostate cancer Neg Hx   . Bladder Cancer Neg Hx   . Kidney cancer Neg Hx     Social History:  reports that he quit smoking about 14 years ago. His smoking use included Cigarettes. He has never used smokeless tobacco. He reports that he does not drink alcohol or use drugs.   Review of Systems  Gastrointestinal (upper)  : Negative for upper GI symptoms  Gastrointestinal (lower) : Negative for lower GI  symptoms  Constitutional : Negative for symptoms  Skin: Negative for skin symptoms  Eyes: Negative for eye symptoms  Ear/Nose/Throat : Negative for Ear/Nose/Throat symptoms  Hematologic/Lymphatic: Negative for Hematologic/Lymphatic symptoms  Cardiovascular : Negative for cardiovascular symptoms  Respiratory : Negative for respiratory symptoms  Endocrine: Negative for endocrine symptoms  Musculoskeletal: Negative for musculoskeletal symptoms  Neurological: Negative for neurological symptoms  Psychologic: Negative for psychiatric symptoms   Physical Exam: There were no vitals taken for this visit.  Constitutional:  Alert and oriented, No acute distress. HEENT: [place] AT, moist mucus membranes.  Trachea midline, no masses. Cardiovascular: No clubbing, cyanosis, or edema. Respiratory: Normal respiratory effort, no increased work of breathing. GI: Abdomen is soft, nontender, nondistended, no abdominal masses GU: No CVA tenderness.  Skin: No rashes, bruises or suspicious lesions. Lymph: No cervical or inguinal adenopathy. Neurologic: Grossly intact, no focal deficits, moving all 4  extremities. Psychiatric: Normal mood and affect.  Laboratory Data: Lab Results  Component Value Date   WBC 9.3 12/14/2015   HGB 14.3 12/14/2015   HCT 42.5 12/14/2015   MCV 88.1 12/14/2015   PLT 186 12/14/2015    Lab Results  Component Value Date   CREATININE 1.19 02/09/2017    No results found for: PSA  No results found for: TESTOSTERONE  No results found for: HGBA1C  Urinalysis    Component Value Date/Time   APPEARANCEUR Clear 08/29/2016 1130   GLUCOSEU Negative 08/29/2016 1130   BILIRUBINUR Negative 08/29/2016 1130   PROTEINUR Negative 08/29/2016 1130   NITRITE Negative 08/29/2016 1130   LEUKOCYTESUR Negative 08/29/2016 1130    Pertinent Imaging:   Assessment & Plan:    1 - 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 Rec operative cysto, right retrogarde / ureteroscopy / biopsy next avail. Keflex to start 4 days prior given known colonization according to most recent CX's.   Alexis Frock, [facility] [address] [place],  [address] [phone]

## 2017-02-28 ENCOUNTER — Telehealth: Payer: Self-pay | Admitting: Radiology

## 2017-02-28 ENCOUNTER — Other Ambulatory Visit: Payer: Self-pay | Admitting: Radiology

## 2017-02-28 NOTE — Telephone Encounter (Signed)
Notified pt's wife of renal mass biopsy scheduled with Dr Pilar Jarvis on 03/16/17, pre-admit testing appt on 03/05/17 @1 :45 & to begin Keflex on 03/13/17. Notified wife of appt with Dr Saralyn Pilar on 03/02/17 @2 :60 for surgical clearance. Questions were answered & no further questions at this time. Wife voices understanding.

## 2017-03-01 LAB — URINE CULTURE: Organism ID, Bacteria: NO GROWTH

## 2017-03-05 ENCOUNTER — Encounter
Admission: RE | Admit: 2017-03-05 | Discharge: 2017-03-05 | Disposition: A | Discharge status: Home / Self Care | Payer: Medicare Other | Source: Ambulatory Visit | Attending: Urology | Admitting: Urology

## 2017-03-05 ENCOUNTER — Encounter: Payer: Self-pay | Admitting: *Deleted

## 2017-03-05 DIAGNOSIS — N319 Neuromuscular dysfunction of bladder, unspecified: Secondary | ICD-10-CM | POA: Insufficient documentation

## 2017-03-05 DIAGNOSIS — I509 Heart failure, unspecified: Secondary | ICD-10-CM | POA: Insufficient documentation

## 2017-03-05 DIAGNOSIS — N2889 Other specified disorders of kidney and ureter: Secondary | ICD-10-CM | POA: Diagnosis not present

## 2017-03-05 DIAGNOSIS — N400 Enlarged prostate without lower urinary tract symptoms: Secondary | ICD-10-CM | POA: Diagnosis not present

## 2017-03-05 DIAGNOSIS — Z01812 Encounter for preprocedural laboratory examination: Secondary | ICD-10-CM | POA: Diagnosis present

## 2017-03-05 DIAGNOSIS — Z951 Presence of aortocoronary bypass graft: Secondary | ICD-10-CM | POA: Insufficient documentation

## 2017-03-05 DIAGNOSIS — M5136 Other intervertebral disc degeneration, lumbar region: Secondary | ICD-10-CM | POA: Insufficient documentation

## 2017-03-05 DIAGNOSIS — R310 Gross hematuria: Secondary | ICD-10-CM | POA: Diagnosis not present

## 2017-03-05 DIAGNOSIS — I2510 Atherosclerotic heart disease of native coronary artery without angina pectoris: Secondary | ICD-10-CM | POA: Insufficient documentation

## 2017-03-05 DIAGNOSIS — R7303 Prediabetes: Secondary | ICD-10-CM | POA: Diagnosis not present

## 2017-03-05 DIAGNOSIS — N3001 Acute cystitis with hematuria: Secondary | ICD-10-CM | POA: Diagnosis not present

## 2017-03-05 DIAGNOSIS — J449 Chronic obstructive pulmonary disease, unspecified: Secondary | ICD-10-CM | POA: Diagnosis not present

## 2017-03-05 DIAGNOSIS — M5116 Intervertebral disc disorders with radiculopathy, lumbar region: Secondary | ICD-10-CM | POA: Insufficient documentation

## 2017-03-05 DIAGNOSIS — I110 Hypertensive heart disease with heart failure: Secondary | ICD-10-CM | POA: Diagnosis not present

## 2017-03-05 LAB — CBC
HCT: 41.8 % (ref 40.0–52.0)
HEMOGLOBIN: 13.7 g/dL (ref 13.0–18.0)
MCH: 27.5 pg (ref 26.0–34.0)
MCHC: 32.7 g/dL (ref 32.0–36.0)
MCV: 84.2 fL (ref 80.0–100.0)
Platelets: 182 10*3/uL (ref 150–440)
RBC: 4.96 MIL/uL (ref 4.40–5.90)
RDW: 16.2 % — ABNORMAL HIGH (ref 11.5–14.5)
WBC: 8.7 10*3/uL (ref 3.8–10.6)

## 2017-03-05 LAB — BASIC METABOLIC PANEL
Anion gap: 8 (ref 5–15)
BUN: 20 mg/dL (ref 6–20)
CALCIUM: 9.6 mg/dL (ref 8.9–10.3)
CO2: 29 mmol/L (ref 22–32)
Chloride: 101 mmol/L (ref 101–111)
Creatinine, Ser: 1.08 mg/dL (ref 0.61–1.24)
Glucose, Bld: 112 mg/dL — ABNORMAL HIGH (ref 65–99)
Potassium: 4.7 mmol/L (ref 3.5–5.1)
Sodium: 138 mmol/L (ref 135–145)

## 2017-03-05 NOTE — Telephone Encounter (Signed)
Advised wife that pt should hold ASA 81mg  beginning 03/09/17 per Dr Tresa Moore & clearance obtained from Dr Saralyn Pilar. Wife voices understanding.

## 2017-03-05 NOTE — Progress Notes (Signed)
Pharmacy Antibiotic Note  Joseph Ritter is a 76 y.o. male scheduled for surgical procedure on 03/16/2017  03/05/2017. Pharmacy has been consulted for gentamicin dosing for surgical prophylaxis.  Plan: Will order Gentamicin 5mg /kg x 1 dose.  Gentamicin 370mg  IV has been ordered for 60 min pre-op.     No data recorded.   Recent Labs Lab 03/05/17 1430  WBC 8.7  CREATININE 1.08    Estimated Creatinine Clearance: 56.1 mL/min (by C-G formula based on SCr of 1.08 mg/dL).    No Known Allergies   Thank you for allowing pharmacy to be a part of this patient's care.  Pernell Dupre, PharmD, BCPS Clinical Pharmacist 03/05/2017 3:57 PM

## 2017-03-05 NOTE — Patient Instructions (Signed)
  Your procedure is scheduled on: Friday 03/16/17 Report to Day Surgery. To find out your arrival time please call [phone] between 1PM - 3PM on Thurs. 03/15/17.  Remember: Instructions that are not followed completely may result in serious medical risk, up to and including death, or upon the discretion of your surgeon and anesthesiologist your surgery may need to be rescheduled.    __x__ 1. Do not eat food or drink liquids after midnight. No gum chewing or hard candies.     ___x_ 2. No Alcohol for 24 hours before or after surgery.   ____ 3. Do Not Smoke For 24 Hours Prior to Your Surgery.   ____ 4. Bring all medications with you on the day of surgery if instructed.    _x___ 5. Notify your doctor if there is any change in your medical condition     (cold, fever, infections).       Do not wear jewelry, make-up, hairpins, clips or nail polish.  Do not wear lotions, powders, or perfumes. You may wear deodorant.  Do not shave 48 hours prior to surgery. Men may shave face and neck.  Do not bring valuables to the hospital.    [facility] is not responsible for any belongings or valuables.               Contacts, dentures or bridgework may not be worn into surgery.  Leave your suitcase in the car. After surgery it may be brought to your room.  For patients admitted to the hospital, discharge time is determined by your                treatment team.   Patients discharged the day of surgery will not be allowed to drive home.   Please read over the following fact sheets that you were given:      __x__ Take these medicines the morning of surgery with A SIP OF WATER:    1. acetaminophen (TYLENOL) 500 MG tablet/traMADol (ULTRAM) 50 MG tablet as needed  2. buPROPion (WELLBUTRIN SR) 100 MG 12 hr tablet  3. levothyroxine (SYNTHROID, LEVOTHROID) 100 MCG tablet  4.RABEprazole (ACIPHEX) 20 MG tablet  Take extra dose the night before and morning of surgery.  5.ramipril (ALTACE) 10 MG  capsule  6.sertraline (ZOLOFT) 100 MG tablet  ____ Fleet Enema (as directed)   ____ Use CHG Soap as directed  ____ Use inhalers on the day of surgery  ____ Stop metformin 2 days prior to surgery    ____ Take 1/2 of usual insulin dose the night before surgery and none on the morning of surgery.   __x__ Stop aspirin on 6/15  ____ Stop Anti-inflammatories on    ____ Stop supplements until after surgery.    ____ Bring C-Pap to the hospital.

## 2017-03-13 MED ORDER — FAMOTIDINE 20 MG PO TABS
ORAL_TABLET | ORAL | Status: AC
  Filled 2017-03-13: qty 1

## 2017-03-13 MED ORDER — CEFAZOLIN SODIUM-DEXTROSE 2-4 GM/100ML-% IV SOLN
INTRAVENOUS | Status: AC
  Filled 2017-03-13: qty 100

## 2017-03-15 MED ORDER — GENTAMICIN SULFATE 40 MG/ML IJ SOLN
5.0000 mg/kg | INTRAVENOUS | Status: AC
  Administered 2017-03-16: 370 mg via INTRAVENOUS
  Filled 2017-03-15: qty 9.25

## 2017-03-16 ENCOUNTER — Ambulatory Visit
Admission: RE | Admit: 2017-03-16 | Discharge: 2017-03-16 | Disposition: A | Discharge status: Home / Self Care | Payer: Medicare Other | Source: Ambulatory Visit | Attending: Urology | Admitting: Urology

## 2017-03-16 ENCOUNTER — Encounter
Admission: RE | Disposition: A | Discharge status: Home / Self Care | Payer: Self-pay | Source: Ambulatory Visit | Attending: Urology

## 2017-03-16 ENCOUNTER — Ambulatory Visit: Payer: Medicare Other | Admitting: Certified Registered Nurse Anesthetist

## 2017-03-16 ENCOUNTER — Telehealth: Payer: Self-pay | Admitting: Urology

## 2017-03-16 ENCOUNTER — Encounter: Payer: Self-pay | Admitting: *Deleted

## 2017-03-16 DIAGNOSIS — N3281 Overactive bladder: Secondary | ICD-10-CM | POA: Insufficient documentation

## 2017-03-16 DIAGNOSIS — M1990 Unspecified osteoarthritis, unspecified site: Secondary | ICD-10-CM | POA: Diagnosis not present

## 2017-03-16 DIAGNOSIS — Z951 Presence of aortocoronary bypass graft: Secondary | ICD-10-CM | POA: Insufficient documentation

## 2017-03-16 DIAGNOSIS — C651 Malignant neoplasm of right renal pelvis: Secondary | ICD-10-CM | POA: Diagnosis not present

## 2017-03-16 DIAGNOSIS — N400 Enlarged prostate without lower urinary tract symptoms: Secondary | ICD-10-CM | POA: Diagnosis not present

## 2017-03-16 DIAGNOSIS — N2889 Other specified disorders of kidney and ureter: Secondary | ICD-10-CM | POA: Diagnosis present

## 2017-03-16 DIAGNOSIS — E039 Hypothyroidism, unspecified: Secondary | ICD-10-CM | POA: Insufficient documentation

## 2017-03-16 DIAGNOSIS — I509 Heart failure, unspecified: Secondary | ICD-10-CM | POA: Diagnosis not present

## 2017-03-16 DIAGNOSIS — K219 Gastro-esophageal reflux disease without esophagitis: Secondary | ICD-10-CM | POA: Diagnosis not present

## 2017-03-16 DIAGNOSIS — I110 Hypertensive heart disease with heart failure: Secondary | ICD-10-CM | POA: Diagnosis not present

## 2017-03-16 DIAGNOSIS — Z8719 Personal history of other diseases of the digestive system: Secondary | ICD-10-CM | POA: Insufficient documentation

## 2017-03-16 DIAGNOSIS — E785 Hyperlipidemia, unspecified: Secondary | ICD-10-CM | POA: Insufficient documentation

## 2017-03-16 DIAGNOSIS — E119 Type 2 diabetes mellitus without complications: Secondary | ICD-10-CM | POA: Diagnosis not present

## 2017-03-16 DIAGNOSIS — Z7982 Long term (current) use of aspirin: Secondary | ICD-10-CM | POA: Diagnosis not present

## 2017-03-16 DIAGNOSIS — M5136 Other intervertebral disc degeneration, lumbar region: Secondary | ICD-10-CM | POA: Diagnosis not present

## 2017-03-16 DIAGNOSIS — F419 Anxiety disorder, unspecified: Secondary | ICD-10-CM | POA: Diagnosis not present

## 2017-03-16 DIAGNOSIS — J449 Chronic obstructive pulmonary disease, unspecified: Secondary | ICD-10-CM | POA: Insufficient documentation

## 2017-03-16 DIAGNOSIS — Z87891 Personal history of nicotine dependence: Secondary | ICD-10-CM | POA: Insufficient documentation

## 2017-03-16 DIAGNOSIS — N319 Neuromuscular dysfunction of bladder, unspecified: Secondary | ICD-10-CM | POA: Diagnosis not present

## 2017-03-16 DIAGNOSIS — I2510 Atherosclerotic heart disease of native coronary artery without angina pectoris: Secondary | ICD-10-CM | POA: Diagnosis not present

## 2017-03-16 DIAGNOSIS — Z79899 Other long term (current) drug therapy: Secondary | ICD-10-CM | POA: Insufficient documentation

## 2017-03-16 HISTORY — PX: URETEROSCOPY: SHX842

## 2017-03-16 HISTORY — PX: CYSTOSCOPY WITH STENT PLACEMENT: SHX5790

## 2017-03-16 HISTORY — DX: Chronic obstructive pulmonary disease, unspecified: J44.9

## 2017-03-16 HISTORY — PX: CYSTOSCOPY W/ RETROGRADES: SHX1426

## 2017-03-16 LAB — GLUCOSE, CAPILLARY
Glucose-Capillary: 136 mg/dL — ABNORMAL HIGH (ref 65–99)
Glucose-Capillary: 127 mg/dL — ABNORMAL HIGH (ref 65–99)

## 2017-03-16 SURGERY — CYSTOSCOPY, WITH RETROGRADE PYELOGRAM
Anesthesia: General | Laterality: Right

## 2017-03-16 MED ORDER — SUGAMMADEX SODIUM 200 MG/2ML IV SOLN
INTRAVENOUS | Status: AC
  Filled 2017-03-16: qty 4

## 2017-03-16 MED ORDER — PROPOFOL 10 MG/ML IV BOLUS
INTRAVENOUS | Status: DC | PRN
  Administered 2017-03-16: 110 mg via INTRAVENOUS

## 2017-03-16 MED ORDER — FENTANYL CITRATE (PF) 100 MCG/2ML IJ SOLN
INTRAMUSCULAR | Status: DC | PRN
  Administered 2017-03-16 (×2): 50 ug via INTRAVENOUS

## 2017-03-16 MED ORDER — ROCURONIUM BROMIDE 50 MG/5ML IV SOLN
INTRAVENOUS | Status: AC
  Filled 2017-03-16: qty 1

## 2017-03-16 MED ORDER — EPHEDRINE SULFATE 50 MG/ML IJ SOLN
INTRAMUSCULAR | Status: DC | PRN
  Administered 2017-03-16: 10 mg via INTRAVENOUS

## 2017-03-16 MED ORDER — HYDROCODONE-ACETAMINOPHEN 5-325 MG PO TABS: 1.0000 | ORAL_TABLET | ORAL | 0 refills | Status: DC | PRN

## 2017-03-16 MED ORDER — PHENYLEPHRINE HCL 10 MG/ML IJ SOLN
INTRAMUSCULAR | Status: AC
  Filled 2017-03-16: qty 1

## 2017-03-16 MED ORDER — LIDOCAINE HCL (PF) 2 % IJ SOLN
INTRAMUSCULAR | Status: AC
  Filled 2017-03-16: qty 2

## 2017-03-16 MED ORDER — SUGAMMADEX SODIUM 200 MG/2ML IV SOLN
INTRAVENOUS | Status: DC | PRN
  Administered 2017-03-16: 200 mg via INTRAVENOUS

## 2017-03-16 MED ORDER — DEXAMETHASONE SODIUM PHOSPHATE 10 MG/ML IJ SOLN
INTRAMUSCULAR | Status: DC | PRN
  Administered 2017-03-16: 10 mg via INTRAVENOUS

## 2017-03-16 MED ORDER — ONDANSETRON HCL 4 MG/2ML IJ SOLN
INTRAMUSCULAR | Status: AC
  Filled 2017-03-16: qty 2

## 2017-03-16 MED ORDER — LACTATED RINGERS IV SOLN
INTRAVENOUS | Status: DC | PRN
  Administered 2017-03-16: 11:00:00 via INTRAVENOUS

## 2017-03-16 MED ORDER — FENTANYL CITRATE (PF) 100 MCG/2ML IJ SOLN
25.0000 ug | INTRAMUSCULAR | Status: DC | PRN
  Administered 2017-03-16 (×2): 25 ug via INTRAVENOUS

## 2017-03-16 MED ORDER — DEXAMETHASONE SODIUM PHOSPHATE 10 MG/ML IJ SOLN
INTRAMUSCULAR | Status: AC
  Filled 2017-03-16: qty 1

## 2017-03-16 MED ORDER — PROPOFOL 10 MG/ML IV BOLUS
INTRAVENOUS | Status: AC
  Filled 2017-03-16: qty 20

## 2017-03-16 MED ORDER — FENTANYL CITRATE (PF) 100 MCG/2ML IJ SOLN
INTRAMUSCULAR | Status: AC
  Filled 2017-03-16: qty 2

## 2017-03-16 MED ORDER — ROCURONIUM BROMIDE 100 MG/10ML IV SOLN
INTRAVENOUS | Status: DC | PRN
  Administered 2017-03-16: 10 mg via INTRAVENOUS
  Administered 2017-03-16: 30 mg via INTRAVENOUS
  Administered 2017-03-16: 10 mg via INTRAVENOUS

## 2017-03-16 MED ORDER — SODIUM CHLORIDE 0.9 % IV SOLN
INTRAVENOUS | Status: DC
  Administered 2017-03-16: 10:00:00 via INTRAVENOUS

## 2017-03-16 MED ORDER — CEPHALEXIN 500 MG PO CAPS: 500.0000 mg | ORAL_CAPSULE | Freq: Three times a day (TID) | ORAL | 0 refills | Status: DC

## 2017-03-16 MED ORDER — LIDOCAINE HCL (CARDIAC) 20 MG/ML IV SOLN
INTRAVENOUS | Status: DC | PRN
  Administered 2017-03-16: 100 mg via INTRAVENOUS

## 2017-03-16 MED ORDER — HYDROCODONE-ACETAMINOPHEN 5-325 MG PO TABS
ORAL_TABLET | ORAL | Status: AC
  Administered 2017-03-16: 1 via ORAL
  Filled 2017-03-16: qty 1

## 2017-03-16 MED ORDER — PHENYLEPHRINE HCL 10 MG/ML IJ SOLN
INTRAMUSCULAR | Status: DC | PRN
  Administered 2017-03-16 (×4): 160 ug via INTRAVENOUS
  Administered 2017-03-16: 80 ug via INTRAVENOUS

## 2017-03-16 MED ORDER — ONDANSETRON HCL 4 MG/2ML IJ SOLN
INTRAMUSCULAR | Status: DC | PRN
  Administered 2017-03-16: 4 mg via INTRAVENOUS

## 2017-03-16 MED ORDER — EPHEDRINE SULFATE 50 MG/ML IJ SOLN
INTRAMUSCULAR | Status: AC
  Filled 2017-03-16: qty 1

## 2017-03-16 MED ORDER — FENTANYL CITRATE (PF) 100 MCG/2ML IJ SOLN
INTRAMUSCULAR | Status: AC
  Administered 2017-03-16: 25 ug via INTRAVENOUS
  Filled 2017-03-16: qty 2

## 2017-03-16 MED ORDER — ONDANSETRON HCL 4 MG/2ML IJ SOLN: 4.0000 mg | Freq: Once | INTRAMUSCULAR | Status: DC | PRN

## 2017-03-16 SURGICAL SUPPLY — 33 items
BACTOSHIELD CHG 4% 4OZ (MISCELLANEOUS) ×2
BASKET ZERO TIP 1.9FR (BASKET) IMPLANT
CATH URETL 5X70 OPEN END (CATHETERS) ×3 IMPLANT
CNTNR SPEC 2.5X3XGRAD LEK (MISCELLANEOUS) ×1
CONRAY 43 FOR UROLOGY 50M (MISCELLANEOUS) ×3 IMPLANT
CONT SPEC 4OZ STER OR WHT (MISCELLANEOUS) ×2
CONTAINER SPEC 2.5X3XGRAD LEK (MISCELLANEOUS) ×1 IMPLANT
FEE TECHNICIAN ONLY PER HOUR (MISCELLANEOUS) ×3 IMPLANT
FIBER LASER LITHO 273 (Laser) ×3 IMPLANT
FORCEPS BIOP PIRANHA Y (CUTTING FORCEPS) ×3 IMPLANT
GLOVE BIO SURGEON STRL SZ7 (GLOVE) ×6 IMPLANT
GLOVE BIO SURGEON STRL SZ7.5 (GLOVE) ×3 IMPLANT
GOWN STRL REUS W/ TWL LRG LVL3 (GOWN DISPOSABLE) ×1 IMPLANT
GOWN STRL REUS W/ TWL LRG LVL4 (GOWN DISPOSABLE) ×1 IMPLANT
GOWN STRL REUS W/TWL LRG LVL3 (GOWN DISPOSABLE) ×2
GOWN STRL REUS W/TWL LRG LVL4 (GOWN DISPOSABLE) ×2
GOWN STRL REUS W/TWL XL LVL3 (GOWN DISPOSABLE) ×3 IMPLANT
GUIDEWIRE SUPER STIFF (WIRE) IMPLANT
INTRODUCER DILATOR DOUBLE (INTRODUCER) ×3 IMPLANT
KIT RM TURNOVER CYSTO AR (KITS) ×3 IMPLANT
PACK CYSTO AR (MISCELLANEOUS) ×3 IMPLANT
SCRUB CHG 4% DYNA-HEX 4OZ (MISCELLANEOUS) ×1 IMPLANT
SENSORWIRE 0.038 NOT ANGLED (WIRE) ×3
SET CYSTO W/LG BORE CLAMP LF (SET/KITS/TRAYS/PACK) ×3 IMPLANT
SHEATH URETERAL 13/15X36 1L (SHEATH) ×3 IMPLANT
SOL .9 NS 3000ML IRR  AL (IV SOLUTION) ×2
SOL .9 NS 3000ML IRR UROMATIC (IV SOLUTION) ×1 IMPLANT
STENT URET 6FRX24 CONTOUR (STENTS) IMPLANT
STENT URET 6FRX26 CONTOUR (STENTS) IMPLANT
SURGILUBE 2OZ TUBE FLIPTOP (MISCELLANEOUS) ×3 IMPLANT
SYRINGE IRR TOOMEY STRL 70CC (SYRINGE) ×3 IMPLANT
WATER STERILE IRR 1000ML POUR (IV SOLUTION) ×3 IMPLANT
WIRE SENSOR 0.038 NOT ANGLED (WIRE) ×1 IMPLANT

## 2017-03-16 NOTE — Anesthesia Preprocedure Evaluation (Addendum)
Anesthesia Evaluation  Patient identified by MRN, date of birth, ID band Patient awake    Reviewed: Allergy & Precautions, NPO status , Patient's Chart, lab work & pertinent test results  History of Anesthesia Complications Negative for: history of anesthetic complications  Airway Mallampati: II  TM Distance: >3 FB Neck ROM: Limited    Dental  (+) Missing, Chipped, Poor Dentition, Dental Advidsory Given   Pulmonary shortness of breath and with exertion, neg sleep apnea, COPD,  COPD inhaler, neg recent URI, former smoker,           Cardiovascular hypertension, Pt. on medications (-) angina+ CAD, + CABG, +CHF and + DOE  (-) Past MI and (-) Cardiac Stents (-) dysrhythmias (-) Valvular Problems/Murmurs     Neuro/Psych neg Seizures PSYCHIATRIC DISORDERS Radiculitis Hx   With leg weakness  Neuromuscular disease    GI/Hepatic Neg liver ROS, PUD, GERD  Medicated and Controlled,  Endo/Other  diabetes, Well Controlled, Type 2Hypothyroidism   Renal/GU Renal disease Bladder dysfunction  BPH    Musculoskeletal  (+) Arthritis , Osteoarthritis,    Abdominal Normal abdominal exam  (+)   Peds negative pediatric ROS (+)  Hematology negative hematology ROS (+)   Anesthesia Other Findings Bladder dysfunction Sub-acute transverse myelitis  Past Medical History: 03/09/2014: Acid indigestion 11/18/2012: Adult hypothyroidism No date: Anxiety 11/18/2012: Arteriosclerosis of coronary artery     Comment: Overview:  PATENT LIMA TO LAD, PATENT SVG TO               PDA AND OCCULUDED SVG TO OM2 BY CATHERIZATION               02/09/2009  11/18/2012: Benign fibroma of prostate 12/19/2013: 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: Borderline diabetes 11/18/2012: BP (high blood pressure) No date: BPH (benign prostatic hypertrophy) 05/03/2015: CAFL (chronic airflow limitation) ([place]) 01/03/2014: CCF (congestive cardiac failure) ([place])     Comment: Overview:   HX OF  05/03/2015: Clinical depression No date: COPD (chronic obstructive pulmonary disease) (* 06/29/2014: DDD (degenerative disc disease), lumbar 05/03/2015: Dermatitis seborrheica 06/04/2014: Detrusor muscle hypertonia No date: Diabetes mellitus without complication ([place])     Comment: diet controlled  03/09/2014: Gastric ulcer 04/07/2002: H/O coronary artery bypass surgery     Comment: Overview:  CABG X3 WITH LIMA TO LAD, SVG OM1               AND PDA  2017: History of urinary self-catheterization 01/03/2014: HLD (hyperlipidemia) 11/18/2012: Incomplete bladder emptying 11/18/2012: Leg weakness No date: Lumbar radiculitis 06/29/2014: Neuritis or radiculitis due to rupture of lumb* No date: Overactive bladder 11/21/2012: Subacute transverse myelitis ([facility]) No date: Teeth problem     Comment: pt reports "bad teeth", "need to be pulled"   Reproductive/Obstetrics negative OB ROS                             Anesthesia Physical  Anesthesia Plan  ASA: III  Anesthesia Plan: General   Post-op Pain Management:    Induction: Intravenous  PONV Risk Score and Plan: 2 and Ondansetron and Dexamethasone  Airway Management Planned: Oral ETT  Additional Equipment:   Intra-op Plan:   Post-operative Plan: Extubation in OR  Informed Consent: I have reviewed the patients History and Physical, chart, labs and discussed the procedure including the risks, benefits and alternatives for the proposed anesthesia with the patient or authorized representative who has indicated his/her understanding and acceptance.   Dental advisory given  Plan Discussed  with: CRNA and Surgeon  Anesthesia Plan Comments:        Anesthesia Quick Evaluation

## 2017-03-16 NOTE — Op Note (Signed)
Date of procedure: 03/16/17  Preoperative diagnosis:  1. Right renal pelvis tumor   Postoperative diagnosis:  1. Right renal pelvis tumor   Procedure: 1. Cystoscopy 2. Right ureteroscopy 3. Biopsy of renal pelvis tumor 4. Fulguration of bleeders with laser 5. Right retrograde pyelogram with interpretation 6. 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 It was biopsied multiple times. I was unable to enter the renal pelvis or any other poles due to the complete obstruction of pelvis by this tumor. Right retropyelogram showed no contrast able to move past the very distal renal pelvis.  EBL: None  Specimens: Renal pelvis tumor biopsy to pathology  Drains: 6 French by 26 cm right double-J ureteral stent  Disposition: Stable to the postanesthesia care unit  Indication for procedure: The patient is a 76 y.o. male with history of gross hematuria who underwent workup and showed a hypodense lesion in his right kidney that was concerning for pyelonephritis. 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 Presents today for biopsy for further characterization and planning.  After reviewing the management options for treatment, the patient elected to proceed with the above surgical procedure(s). We have discussed the potential benefits and risks of the procedure, side effects of the proposed treatment, the likelihood of the patient achieving the goals of the procedure, and any potential problems that might occur during the procedure or recuperation. Informed consent has been obtained.  Description of procedure: The patient was met in the preoperative area. All risks, benefits, and  indications of the procedure were described in great detail. The patient consented to the procedure. Preoperative antibiotics were given. The patient was taken to the operative theater. General anesthesia was induced per the anesthesia service. The patient was then placed in the dorsal lithotomy position and prepped and draped in the usual sterile fashion. A preoperative timeout was called.   A 21 French 30 cystoscope was inserted into the patient's bladder per urethra atraumatically. Date of a dual-lumen catheter, 2 sensor wires were passed into the patient's renal pelvis. Very difficult to pass these sensor wires past the distal renal pelvis due to apparent obstruction from the tumor. Over one of the sensor wires, ureteral access sheath was placed easily atraumatically under fluoroscopy. A flexible ureter scope was inserted into the patient's right renal pelvis. Immediately upon passing the UPJ there is tumor completely obstructing and obliterating the entrance to the more proximal right renal pelvis. His unable to advance the scope any further. This tumor was biopsied with at least 3 large fragments with the [place] biopsy graspers. There is multiple areas that were grossly visible that were sent to pathology. The laser was then used to fulgurate any bleeding. Again attempts at moving past the distal pelvis wasn't possible due to the obliteration of the tumor. A right retrograde pyelogram was obtained which also showed no contrast being able to pushed passed the right renal pelvis. At this point, decision was made to leave a right ureteral stent due to the placement of the access these however the curl of the stent would likely not be able to pass too far into the proximal kidney due to the tumor. The access sheath was removed under direct visualization which revealed no trauma.  The cystoscope was then reassembled over the remaining sensor wire and a 6 [place] by 26 cm double-J ureteral stent was placed and the  sensor wire removed. A curl seen in the patient's urinary bladder under direct visualization. The proximal end of the stent did appear to be pushed up against the obliterated distal right renal pelvis. I do not think it was completely within the kidney though this is expected as the ureteroscope was also unable to be advanced passed this point. At this point, the patient's bladder was drained. There is clear urine and contrast draining from the right ureteral stent. The patient was then awoken from anesthesia and transferred stable to the postanesthesia care unit.  Plan: The patient will follow-up next week to discuss his pathology results and likely remove his right ureteral stent. Even if the tumor does come back low-grade, he will still likely need a right nephroureterectomy did a complete obliteration of his right renal pelvis. Though, I do expect for this to come back as high-grade TCC and pathology given today's operative findings as well as the neovascularity seen on his CT scan.   Baruch Gouty, M.D.

## 2017-03-16 NOTE — Interval H&P Note (Signed)
History and Physical Interval Note:  03/16/2017 10:46 AM  Joseph Ritter  has presented today for surgery, with the diagnosis of right renal mass  The various methods of treatment have been discussed with the patient and family. After consideration of risks, benefits and other options for treatment, the patient has consented to  Procedure(s): CYSTOSCOPY WITH RETROGRADE PYELOGRAM (Right) URETEROSCOPY BIOPSY RENAL MASS (Right) CYSTOSCOPY WITH STENT PLACEMENT (Right) as a surgical intervention .  The patient's history has been reviewed, patient examined, no change in status, stable for surgery.  I have reviewed the patient's chart and labs.  Questions were answered to the patient's satisfaction.    RRR Lungs Clear  [name]

## 2017-03-16 NOTE — Transfer of Care (Signed)
Immediate Anesthesia Transfer of Care Note  Patient: Joseph Ritter  Procedure(s) Performed: Procedure(s): CYSTOSCOPY WITH RETROGRADE PYELOGRAM (Right) URETEROSCOPY BIOPSY RENAL MASS (Right) CYSTOSCOPY WITH STENT PLACEMENT (Right)  Patient Location: PACU  Anesthesia Type:General  Level of Consciousness: awake, alert  and oriented  Airway & Oxygen Therapy: Patient Spontanous Breathing and Patient connected to face mask oxygen  Post-op Assessment: Report given to RN, Post -op Vital signs reviewed and stable and Patient moving all extremities X 4  Post vital signs: Reviewed and stable  Last Vitals:  Vitals:   03/16/17 0931 03/16/17 1155  BP: (!) 167/133   Pulse: 92   Resp: 16   Temp: 36.5 C (P) 36.4 C    Last Pain:  Vitals:   03/16/17 0931  TempSrc: Oral  PainSc: 2          Complications: No apparent anesthesia complications

## 2017-03-16 NOTE — Anesthesia Procedure Notes (Signed)
Procedure Name: Intubation Date/Time: 03/16/2017 11:09 AM Performed by: Tamala Julian, Ruvim Risko Pre-anesthesia Checklist: Patient identified, Emergency Drugs available, Suction available, Patient being monitored and Timeout performed Patient Re-evaluated:Patient Re-evaluated prior to inductionOxygen Delivery Method: Circle system utilized Preoxygenation: Pre-oxygenation with 100% oxygen Intubation Type: IV induction Ventilation: Oral airway inserted - appropriate to patient size and Mask ventilation without difficulty Laryngoscope Size: Mac and 4 Grade View: Grade II Tube type: Oral Tube size: 7.5 mm Number of attempts: 1 Airway Equipment and Method: Stylet Placement Confirmation: ETT inserted through vocal cords under direct vision,  positive ETCO2 and breath sounds checked- equal and bilateral Secured at: 21 cm Tube secured with: Tape Dental Injury: Teeth and Oropharynx as per pre-operative assessment

## 2017-03-16 NOTE — Anesthesia Postprocedure Evaluation (Signed)
Anesthesia Post Note  Patient: Joseph Ritter  Procedure(s) Performed: Procedure(s) (LRB): CYSTOSCOPY WITH RETROGRADE PYELOGRAM (Right) URETEROSCOPY BIOPSY RENAL MASS (Right) CYSTOSCOPY WITH STENT PLACEMENT (Right)  Patient location during evaluation: PACU Anesthesia Type: General Level of consciousness: awake and alert Pain management: pain level controlled Vital Signs Assessment: post-procedure vital signs reviewed and stable Respiratory status: spontaneous breathing, nonlabored ventilation, respiratory function stable and patient connected to nasal cannula oxygen Cardiovascular status: blood pressure returned to baseline and stable Postop Assessment: no signs of nausea or vomiting Anesthetic complications: no     Last Vitals:  Vitals:   03/16/17 1243 03/16/17 1345  BP: (!) 159/71 (!) 179/98  Pulse: 82 84  Resp: 14   Temp: 36.4 C     Last Pain:  Vitals:   03/16/17 1345  TempSrc:   PainSc: [place]

## 2017-03-16 NOTE — Anesthesia Post-op Follow-up Note (Cosign Needed)
Anesthesia QCDR form completed.        

## 2017-03-16 NOTE — Discharge Instructions (Signed)
Cystoscopy Cystoscopy is a procedure that is used to help diagnose and sometimes treat conditions that affect that lower urinary tract. The lower urinary tract includes the bladder and the tube that drains urine from the bladder out of the body (urethra). Cystoscopy is performed with a thin, tube-shaped instrument with a light and camera at the end (cystoscope). The cystoscope may be hard (rigid) or flexible, depending on the goal of the procedure.The cystoscope is inserted through the urethra, into the bladder. Cystoscopy may be recommended if you have:  Urinary tractinfections that keep coming back (recurring).  Blood in the urine (hematuria).  Loss of bladder control (urinary incontinence) or an overactive bladder.  Unusual cells found in a urine sample.  A blockage in the urethra.  Painful urination.  An abnormality in the bladder found during an intravenous pyelogram (IVP) or CT scan.  Cystoscopy may also be done to remove a sample of tissue to be examined under a microscope (biopsy). Tell a health care provider about:  Any allergies you have.  All medicines you are taking, including vitamins, herbs, eye drops, creams, and over-the-counter medicines.  Any problems you or family members have had with anesthetic medicines.  Any blood disorders you have.  Any surgeries you have had.  Any medical conditions you have.  Whether you are pregnant or may be pregnant. What are the risks? Generally, this is a safe procedure. However, problems may occur, including:  Infection.  Bleeding.  Allergic reactions to medicines.  Damage to other structures or organs.  What happens before the procedure?  Ask your health care provider about: ? Changing or stopping your regular medicines. This is especially important if you are taking diabetes medicines or blood thinners. ? Taking medicines such as aspirin and ibuprofen. These medicines can thin your blood. Do not take these medicines  before your procedure if your health care provider instructs you not to.  Follow instructions from your health care provider about eating or drinking restrictions.  You may be given antibiotic medicine to help prevent infection.  You may have an exam or testing, such as X-rays of the bladder, urethra, or kidneys.  You may have urine tests to check for signs of infection.  Plan to have someone take you home after the procedure. What happens during the procedure?  To reduce your risk of infection,your health care team will wash or sanitize their hands.  You will be given one or more of the following: ? A medicine to help you relax (sedative). ? A medicine to numb the area (local anesthetic).  The area around the opening of your urethra will be cleaned.  The cystoscope will be passed through your urethra into your bladder.  Germ-free (sterile)fluid will flow through the cystoscope to fill your bladder. The fluid will stretch your bladder so that your surgeon can clearly examine your bladder walls.  The cystoscope will be removed and your bladder will be emptied. The procedure may vary among health care providers and hospitals. What happens after the procedure?  You may have some soreness or pain in your abdomen and urethra. Medicines will be available to help you.  You may have some blood in your urine.  Do not drive for 24 hours if you received a sedative. This information is not intended to replace advice given to you by your health care provider. Make sure you discuss any questions you have with your health care provider. Document Released: 09/08/2000 Document Revised: 01/20/2016 Document Reviewed: 07/29/2015 Elsevier Interactive   Patient Education  2017 [employer].

## 2017-03-16 NOTE — Telephone Encounter (Signed)
-----   Message from [name], MD sent at 03/16/2017 11:52 AM EDT ----- Patient needs to see me next week for pathology results and possible stent removal. Needs half hour appt. Do not let anyone double book this. If cannot get half hour spot, then please put just before lunch only, since I will likely need to talk to him about a major surgery and remove his stent which will take a long time. Thanks.

## 2017-03-16 NOTE — Telephone Encounter (Signed)
App made ° ° °Michelle  °

## 2017-03-16 NOTE — H&P (View-Only) (Signed)
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 UDS at [facility] patternt with small contractions but ineficient emptying, PVR's >42ml. Manages with self cath about QID. Tried ofice BTX 100 IH and oral anticholinergics w/o help for urgency component which is minimal bother. No incontinence.  Recent Surveaillance: 2016 - [place] no hydro, Cr <1.0.  2017 - CT ? Rt renal mass, Cr. <1.5 2018 - CT Enlarging Rt renal mass, Cr 1.19  2 - Gross Hematuria - gross hematuria 08/2016. CT with ? Endophytic right renal mass v. Pyelo, Cysto 08/2016 unremakrable.  3 - Enlarging Endophytic Right Renal Mass - endophytic hypodense mass by CT 08/2016 on eval hematuria, felt likely resolving pyelo. Repeat imaging 01/2017 much more concerning with enlarging endophytic mass with right upper pole hydro and peri-renal neovascularity. No adenopathy. 3 artery / 2 vein right renovascular anatomy.  Today "Joseph Ritter" is seen in f/u above and CT which unfortunatley shows progression of right renal mass.     PMH: Past Medical History:  Diagnosis Date  . Acid indigestion 03/09/2014  . Adult hypothyroidism 11/18/2012  . Anxiety   . Arteriosclerosis of coronary artery 11/18/2012   Overview:  PATENT LIMA TO LAD, PATENT SVG TO PDA AND OCCULUDED SVG TO OM2 BY CATHERIZATION 02/09/2009   . 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 Borderline diabetes 05/25/2014  . BP (high blood pressure) 11/18/2012  . BPH (benign prostatic hypertrophy)   . CAFL (chronic airflow limitation) ([place]) 05/03/2015  . CCF (congestive cardiac failure) ([place]) 01/03/2014   Overview:  HX OF   . Clinical depression 05/03/2015  .  DDD (degenerative disc disease), lumbar 06/29/2014  . 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 Diabetes mellitus without complication ([place])   . Gastric ulcer 03/09/2014  . 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 History of urinary self-catheterization 2017  . HLD (hyperlipidemia) 01/03/2014  . Incomplete bladder emptying 11/18/2012  . Leg weakness 11/18/2012  . Lumbar radiculitis   . Neuritis or radiculitis due to rupture of lumbar intervertebral disc 06/29/2014  . Overactive bladder   . 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 Teeth problem    pt reports "bad teeth", "need to be pulled"    Surgical History: Past Surgical History:  Procedure Laterality Date  . ARTERY BIOPSY Right 12/16/2015   Procedure: BIOPSY TEMPORAL ARTERY;  Surgeon: Algernon Huxley, MD;  Location: [facility] ORS;  Service: Vascular;  Laterality: Right;  . CARDIAC CATHETERIZATION    . CATARACT EXTRACTION W/PHACO Left 01/26/2016   Procedure: CATARACT EXTRACTION PHACO AND INTRAOCULAR LENS PLACEMENT (IOC) LEFT EYE;  Surgeon: Leandrew Koyanagi, MD;  Location: [facility];  Service: Ophthalmology;  Laterality: Left;  . CORONARY ARTERY BYPASS GRAFT  04/07/2002   [facility]  . Cysto Bladder Botox Injection  07/02/2014  . EYE SURGERY Left    blood clot behind left eye  . HAND SURGERY Bilateral   . HERNIA REPAIR Right    inguinal hernia repair    Home Medications:  Allergies as of 02/27/2017   No Known Allergies     Medication List  Accurate as of 02/27/17  8:19 AM. Always use your most recent med list.          acetaminophen 500 MG tablet Commonly known as:  TYLENOL Take 500 mg by mouth every 6 (six) hours as needed.   aspirin EC 81 MG tablet Take 81 mg by mouth daily.   buPROPion 100 MG 12 hr tablet Commonly known as:  WELLBUTRIN SR TAKE ONE TABLET BY MOUTH ONCE A DAY   cefpodoxime 200 MG tablet Commonly  known as:  VANTIN Take 1 tablet (200 mg total) by mouth 2 (two) times daily.   CENTRUM SILVER tablet Take 1 tablet by mouth daily.   CITRUCEL 500 MG Tabs Generic drug:  Methylcellulose (Laxative) Take 1 tablet by mouth daily.   cyclobenzaprine 10 MG tablet Commonly known as:  FLEXERIL Reported on 12/16/2015   ketoconazole 2 % shampoo Commonly known as:  NIZORAL APPLY SHAMPOO TO HAIR AS DIRECTED TWICE A WEEK   Lactobacillus Acidophilus Powd Take by mouth.   levothyroxine 100 MCG tablet Commonly known as:  SYNTHROID, LEVOTHROID 1 TABLET BY MOUTH EVERY MORNING FOR THYROID   polyethylene glycol powder powder Commonly known as:  GLYCOLAX/MIRALAX Take by mouth as needed.   RABEprazole 20 MG tablet Commonly known as:  ACIPHEX Take 20 mg by mouth daily.   ramipril 10 MG capsule Commonly known as:  ALTACE TAKE ONE CAPSULE BY MOUTH EVERY DAY   senna-docusate 8.6-50 MG tablet Commonly known as:  Senokot-S Take 1 tablet by mouth daily.   sertraline 100 MG tablet Commonly known as:  ZOLOFT TAKE 1 TABLET BY MOUTH ONCE A DAY   tamsulosin 0.4 MG Caps capsule Commonly known as:  FLOMAX Take 0.8 mg by mouth daily after supper.   traMADol 50 MG tablet Commonly known as:  ULTRAM 1 qid prn   VYTORIN 10-40 MG tablet Generic drug:  ezetimibe-simvastatin TAKE 1 TABLET BY MOUTH EVERY DAY AT BEDTIME       Allergies: No Known Allergies  Family History: Family History  Problem Relation Age of Onset  . Heart attack Mother   . Heart disease Father   . Prostate cancer Neg Hx   . Bladder Cancer Neg Hx   . Kidney cancer Neg Hx     Social History:  reports that he quit smoking about 14 years ago. His smoking use included Cigarettes. He has never used smokeless tobacco. He reports that he does not drink alcohol or use drugs.   Review of Systems  Gastrointestinal (upper)  : Negative for upper GI symptoms  Gastrointestinal (lower) : Negative for lower GI  symptoms  Constitutional : Negative for symptoms  Skin: Negative for skin symptoms  Eyes: Negative for eye symptoms  Ear/Nose/Throat : Negative for Ear/Nose/Throat symptoms  Hematologic/Lymphatic: Negative for Hematologic/Lymphatic symptoms  Cardiovascular : Negative for cardiovascular symptoms  Respiratory : Negative for respiratory symptoms  Endocrine: Negative for endocrine symptoms  Musculoskeletal: Negative for musculoskeletal symptoms  Neurological: Negative for neurological symptoms  Psychologic: Negative for psychiatric symptoms   Physical Exam: There were no vitals taken for this visit.  Constitutional:  Alert and oriented, No acute distress. HEENT: [place] AT, moist mucus membranes.  Trachea midline, no masses. Cardiovascular: No clubbing, cyanosis, or edema. Respiratory: Normal respiratory effort, no increased work of breathing. GI: Abdomen is soft, nontender, nondistended, no abdominal masses GU: No CVA tenderness.  Skin: No rashes, bruises or suspicious lesions. Lymph: No cervical or inguinal adenopathy. Neurologic: Grossly intact, no focal deficits, moving all 4  extremities. Psychiatric: Normal mood and affect.  Laboratory Data: Lab Results  Component Value Date   WBC 9.3 12/14/2015   HGB 14.3 12/14/2015   HCT 42.5 12/14/2015   MCV 88.1 12/14/2015   PLT 186 12/14/2015    Lab Results  Component Value Date   CREATININE 1.19 02/09/2017    No results found for: PSA  No results found for: TESTOSTERONE  No results found for: HGBA1C  Urinalysis    Component Value Date/Time   APPEARANCEUR Clear 08/29/2016 1130   GLUCOSEU Negative 08/29/2016 1130   BILIRUBINUR Negative 08/29/2016 1130   PROTEINUR Negative 08/29/2016 1130   NITRITE Negative 08/29/2016 1130   LEUKOCYTESUR Negative 08/29/2016 1130    Pertinent Imaging:   Assessment & Plan:    1 - 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 Rec operative cysto, right retrogarde / ureteroscopy / biopsy next avail. Keflex to start 4 days prior given known colonization according to most recent CX's.   Alexis Frock, [facility] [address] [place], [place] [address] [phone]

## 2017-03-17 ENCOUNTER — Encounter: Payer: Self-pay | Admitting: Urology

## 2017-03-19 LAB — SURGICAL PATHOLOGY

## 2017-03-23 ENCOUNTER — Ambulatory Visit
Admission: RE | Admit: 2017-03-23 | Discharge: 2017-03-23 | Disposition: A | Discharge status: Home / Self Care | Payer: Medicare Other | Source: Ambulatory Visit | Attending: Urology | Admitting: Urology

## 2017-03-23 ENCOUNTER — Other Ambulatory Visit: Payer: Self-pay | Admitting: Radiology

## 2017-03-23 ENCOUNTER — Ambulatory Visit (INDEPENDENT_AMBULATORY_CARE_PROVIDER_SITE_OTHER): Payer: Medicare Other | Admitting: Urology

## 2017-03-23 VITALS — BP 174/65 | HR 89 | Ht 67.0 in | Wt 167.0 lb

## 2017-03-23 DIAGNOSIS — J449 Chronic obstructive pulmonary disease, unspecified: Secondary | ICD-10-CM | POA: Insufficient documentation

## 2017-03-23 DIAGNOSIS — C649 Malignant neoplasm of unspecified kidney, except renal pelvis: Secondary | ICD-10-CM | POA: Insufficient documentation

## 2017-03-23 DIAGNOSIS — R310 Gross hematuria: Secondary | ICD-10-CM | POA: Diagnosis not present

## 2017-03-23 DIAGNOSIS — C651 Malignant neoplasm of right renal pelvis: Secondary | ICD-10-CM

## 2017-03-23 LAB — URINALYSIS, COMPLETE
Bilirubin, UA: NEGATIVE
Glucose, UA: NEGATIVE
KETONES UA: NEGATIVE
NITRITE UA: NEGATIVE
Urobilinogen, Ur: 0.2 mg/dL (ref 0.2–1.0)
pH, UA: 5 (ref 5.0–7.5)

## 2017-03-23 LAB — MICROSCOPIC EXAMINATION

## 2017-03-23 MED ORDER — LIDOCAINE HCL 2 % EX GEL
1.0000 "application " | Freq: Once | CUTANEOUS | Status: AC
  Administered 2017-03-23: 1 via URETHRAL

## 2017-03-23 MED ORDER — CIPROFLOXACIN HCL 500 MG PO TABS
500.0000 mg | ORAL_TABLET | Freq: Once | ORAL | Status: AC
  Administered 2017-03-23: 500 mg via ORAL

## 2017-03-23 NOTE — Progress Notes (Signed)
03/23/2017 1:44 PM   Joseph Ritter January 07, 1941 [id number]  Referring provider: [name], MD [facility] [place] [address]  Chief Complaint  Patient presents with  . 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 UDS at [facility] patternt with small contractions but ineficient emptying, PVR's >46ml. Manages with self cath about QID. Tried ofice BTX 100 IH and oral anticholinergics w/o help for urgency component which is minimal bother. No incontinence.  Recent Surveaillance: 2016 - [place] no hydro, Cr <1.0.  2017 - CT ? Rt renal mass, Cr. <1.5 2018 - CT Enlarging Rt renal mass, Cr 1.19  2 - Gross Hematuria - gross hematuria 08/2016. CT with ? Endophytic right renal mass v. Pyelo, Cysto 08/2016 unremakrable.  3 - Right renal pelvis high grade TCC - endophytic hypodense mass by CT 08/2016 on eval hematuria, felt likely resolving pyelo. Repeat imaging 01/2017 much more concerning with enlarging endophytic mass with right upper pole hydro and peri-renal neovascularity. No adenopathy. 3 artery / 2 vein right renovascular anatomy. 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  His renal pelvis was completely full of tumor and only the distal portion of the renal pelvis was able to be visualized. The tumor blocked access to the proximal renal pelvis as well as the middle, upper, lower poles.  He does have history of right inguinal herniorrhaphy. He does have a noticeable left inguinal hernia at this time as well as a small umbilical hernia. He has no other abdominal surgeries.  PMH: Past Medical History:  Diagnosis Date  . Acid indigestion 03/09/2014  . Adult hypothyroidism 11/18/2012  . Anxiety   . Arteriosclerosis of coronary artery 11/18/2012   Overview:  PATENT LIMA TO  LAD, PATENT SVG TO PDA AND OCCULUDED SVG TO OM2 BY CATHERIZATION 02/09/2009   . 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 Borderline diabetes 05/25/2014  . BP (high blood pressure) 11/18/2012  . BPH (benign prostatic hypertrophy)   . CAFL (chronic airflow limitation) ([place]) 05/03/2015  . CCF (congestive cardiac failure) ([place]) 01/03/2014   Overview:  HX OF   . Clinical depression 05/03/2015  . COPD (chronic obstructive pulmonary disease) ([place])   . DDD (degenerative disc disease), lumbar 06/29/2014  . 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 Diabetes mellitus without complication (HCC)    diet controlled   . Gastric ulcer 03/09/2014  . 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 History of urinary self-catheterization 2017  . HLD (hyperlipidemia) 01/03/2014  . Incomplete bladder emptying 11/18/2012  . Leg weakness 11/18/2012  . Lumbar radiculitis   . Neuritis or radiculitis due to rupture of lumbar intervertebral disc 06/29/2014  . Overactive bladder   . 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 Teeth problem    pt reports "bad teeth", "need to be pulled"    Surgical History: Past Surgical History:  Procedure Laterality Date  . ARTERY BIOPSY Right 12/16/2015   Procedure: BIOPSY TEMPORAL ARTERY;  Surgeon: Algernon Huxley, MD;  Location: [facility];  Service: Vascular;  Laterality: Right;  . CARDIAC CATHETERIZATION    . CATARACT EXTRACTION W/PHACO Left 01/26/2016   Procedure: CATARACT EXTRACTION PHACO AND INTRAOCULAR LENS PLACEMENT (IOC) LEFT EYE;  Surgeon: Leandrew Koyanagi, MD;  Location: [facility];  Service: Ophthalmology;  Laterality: Left;  . CORONARY ARTERY BYPASS GRAFT  04/07/2002   [facility]  . Cysto Bladder Botox Injection  07/02/2014  . CYSTOSCOPY W/ RETROGRADES Right 03/16/2017   Procedure: CYSTOSCOPY WITH RETROGRADE PYELOGRAM;  Surgeon: [name], MD;  Location: [facility];  Service: Urology;  Laterality: Right;  . CYSTOSCOPY WITH STENT PLACEMENT Right 03/16/2017   Procedure: CYSTOSCOPY WITH STENT PLACEMENT;  Surgeon: [name], MD;  Location: [facility];  Service: Urology;  Laterality: Right;  . EYE SURGERY Left    blood clot behind left eye  . HAND SURGERY Bilateral   . HERNIA REPAIR Right    inguinal hernia repair  . URETEROSCOPY Right 03/16/2017   Procedure: URETEROSCOPY BIOPSY RENAL MASS;  Surgeon: [name], MD;  Location: [facility];  Service: Urology;  Laterality: Right;    Home Medications:  Allergies as of 03/23/2017   No Known Allergies     Medication List       Accurate as of 03/23/17  1:44 PM. Always use your most recent med list.          acetaminophen 500 MG tablet Commonly known as:  TYLENOL Take 500 mg by mouth 2 (two) times daily as needed (for pain.).   aspirin EC 81 MG tablet Take 81 mg by mouth daily after breakfast.   aspirin 325 MG tablet Take 325 mg by mouth daily as needed (CHEW & SWALLOW ONE TABLET IF NEEDED FOR CHEST PAIN (REPLACES NITROGLYCERIN)).   buPROPion 100 MG 12 hr tablet Commonly known as:  WELLBUTRIN SR Take 100 mg by mouth daily after breakfast.   cyclobenzaprine 10 MG tablet Commonly known as:  FLEXERIL Take 10 mg by mouth at bedtime as needed for muscle spasms.   ezetimibe-simvastatin 10-40 MG tablet Commonly known as:  VYTORIN Take 1 tablet by mouth at bedtime.   HYDROcodone-acetaminophen 5-325 MG tablet Commonly known as:  NORCO Take 1-2 tablets by mouth every 4 (four) hours as needed for moderate pain.   hyoscyamine 0.125 MG Tbdp disintergrating tablet Commonly known as:  ANASPAZ Place 0.125 mg under the tongue every 6 (six) hours as needed (for abdominal cramping due to IBS).   ketoconazole 2 % shampoo Commonly known as:  NIZORAL APPLY SHAMPOO TO HAIR AS NEEDED FOR FLAKY/ITCHY SCALP   levothyroxine 100 MCG tablet Commonly known as:   SYNTHROID, LEVOTHROID Take 100 mcg by mouth daily before breakfast.   multivitamin with minerals Tabs tablet Take 1 tablet by mouth daily after breakfast. CENTRUM SILVER   PEPPERMINT OIL PO Take 1 capsule by mouth 3 (three) times daily as needed (for IBS). IBgard (active ingredient: 90 mg ultrapurified peppermint oil)   polyethylene glycol powder powder Commonly known as:  GLYCOLAX/MIRALAX Take 17 g by mouth daily after breakfast. Mix with glass of water   PROBIOTIC PO Take 1 capsule by mouth daily as needed (for digestive health (see instructions)). AFTER BREAKFAST EITHER TAKE A PROBIOTIC OR EAT YOGURT   RABEprazole 20 MG tablet Commonly known as:  ACIPHEX Take 20 mg by mouth daily before breakfast.   ramipril 10 MG capsule Commonly known as:  ALTACE Take 10 mg by mouth daily after breakfast.   sertraline 100 MG tablet Commonly known as:  ZOLOFT Take 100 mg by mouth daily after breakfast.   tamsulosin 0.4 MG Caps capsule Commonly known as:  FLOMAX Take 0.8 mg by mouth daily after supper.   traMADol  50 MG tablet Commonly known as:  ULTRAM Take 50 mg by mouth 2 (two) times daily as needed (for pain.).       Allergies: No Known Allergies  Family History: Family History  Problem Relation Age of Onset  . Heart attack Mother   . Heart disease Father   . Prostate cancer Neg Hx   . Bladder Cancer Neg Hx   . Kidney cancer Neg Hx     Social History:  reports that he quit smoking about 14 years ago. His smoking use included Cigarettes. He has never used smokeless tobacco. He reports that he does not drink alcohol or use drugs.  ROS:                                        Physical Exam: BP (!) 174/65 (BP Location: Left Arm, Patient Position: Sitting, Cuff Size: Normal)   Pulse 89   Ht 5\' 7"  (1.702 m)   Wt 167 lb (75.8 kg)   BMI 26.16 kg/m   Constitutional:  Alert and oriented, No acute distress. HEENT: [place] AT, moist mucus membranes.  Trachea  midline, no masses. Cardiovascular: No clubbing, cyanosis, or edema. Respiratory: Normal respiratory effort, no increased work of breathing. GI: Abdomen is soft, nontender, nondistended, no abdominal masses GU: No CVA tenderness.  Skin: No rashes, bruises or suspicious lesions. Lymph: No cervical or inguinal adenopathy. Neurologic: Grossly intact, no focal deficits, moving all 4 extremities. Psychiatric: Normal mood and affect.  Laboratory Data: Lab Results  Component Value Date   WBC 8.7 03/05/2017   HGB 13.7 03/05/2017   HCT 41.8 03/05/2017   MCV 84.2 03/05/2017   PLT 182 03/05/2017    Lab Results  Component Value Date   CREATININE 1.08 03/05/2017    No results found for: PSA  No results found for: TESTOSTERONE  No results found for: HGBA1C  Urinalysis    Component Value Date/Time   APPEARANCEUR Clear 08/29/2016 1130   GLUCOSEU Negative 08/29/2016 1130   BILIRUBINUR Negative 08/29/2016 1130   PROTEINUR Negative 08/29/2016 1130   NITRITE Negative 08/29/2016 1130   LEUKOCYTESUR Negative 08/29/2016 1130     Cystoscopy Procedure Note  Patient identification was confirmed, informed consent was obtained, and patient was prepped using Betadine solution.  Lidocaine jelly was administered per urethral meatus.    Preoperative abx where received prior to procedure.     Pre-Procedure: - Inspection reveals a normal caliber ureteral meatus.  Procedure: The flexible cystoscope was introduced without difficulty - No urethral strictures/lesions are present. - Right ureteral stent removed with flexible graspers intact per meatus.   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 We discussed standard of care in this situation is right nephroureterectomy. He understands that this involved removing both the right kidney as well as the ureter down to the bladder. He does understand that we will remove a small segment of bladder and close it primarily which will result in the need for Foley catheter for a few weeks with prior cystogram before Removed.  We also discussed the risks, benefits, indications of surgery. He understands the risks include but are not limited to bleeding, infection, iatrogenic injury, incomplete resection of tumor, need for postoperative drain, need for Foley catheter, positioning injury, DVT, PE, pneumonia, and even death. All questions were answered. We described the nature of robotic nephroureterectomy on the right side. All questions were answered. The patient has agreed to proceed with surgery. I will also order Chest Xray now to ensure there are no obvious lung nodules as his insurance won't pay for a CT of his chest based on the diagnosis of transitional cell carcinoma of the right collecting system.      [name], MD  [facility] [address] [place], [place] [address] [phone]

## 2017-03-26 ENCOUNTER — Other Ambulatory Visit: Payer: Self-pay | Admitting: Radiology

## 2017-03-26 DIAGNOSIS — C651 Malignant neoplasm of right renal pelvis: Secondary | ICD-10-CM

## 2017-03-26 NOTE — Telephone Encounter (Signed)
No answer/unable to LM. Need to discuss upcoming surgery.

## 2017-03-26 NOTE — Telephone Encounter (Signed)
Notified pt's wife of surgery scheduled with Dr Pilar Jarvis on 05/02/17, pre-admit testing appt on 04/20/17 @9 :00 & to call day prior to surgery for arrival time to [facility]. Advised pt to hold ASA 81mg  beginning on 04/25/17 per previous clearance obtained from Dr Saralyn Pilar. Questions answered to wife's satisfaction & no further questions at this time. Wife voices understanding.

## 2017-04-10 ENCOUNTER — Ambulatory Visit: Payer: Medicare Other

## 2017-04-19 ENCOUNTER — Encounter
Admission: RE | Admit: 2017-04-19 | Discharge: 2017-04-19 | Disposition: A | Discharge status: Home / Self Care | Payer: Medicare Other | Source: Ambulatory Visit | Attending: Urology | Admitting: Urology

## 2017-04-19 DIAGNOSIS — Z01818 Encounter for other preprocedural examination: Secondary | ICD-10-CM | POA: Diagnosis not present

## 2017-04-19 DIAGNOSIS — E785 Hyperlipidemia, unspecified: Secondary | ICD-10-CM | POA: Insufficient documentation

## 2017-04-19 DIAGNOSIS — Z7982 Long term (current) use of aspirin: Secondary | ICD-10-CM | POA: Insufficient documentation

## 2017-04-19 DIAGNOSIS — Z79899 Other long term (current) drug therapy: Secondary | ICD-10-CM | POA: Diagnosis not present

## 2017-04-19 DIAGNOSIS — R030 Elevated blood-pressure reading, without diagnosis of hypertension: Secondary | ICD-10-CM | POA: Diagnosis not present

## 2017-04-19 HISTORY — DX: Prediabetes: R73.03

## 2017-04-19 HISTORY — DX: Other specified postprocedural states: Z98.890

## 2017-04-19 HISTORY — DX: Other specified postprocedural states: R11.2

## 2017-04-19 LAB — CBC
HEMATOCRIT: 41.2 % (ref 40.0–52.0)
HEMOGLOBIN: 13.8 g/dL (ref 13.0–18.0)
MCH: 27.8 pg (ref 26.0–34.0)
MCHC: 33.6 g/dL (ref 32.0–36.0)
MCV: 82.7 fL (ref 80.0–100.0)
Platelets: 184 10*3/uL (ref 150–440)
RBC: 4.98 MIL/uL (ref 4.40–5.90)
RDW: 15.2 % — ABNORMAL HIGH (ref 11.5–14.5)
WBC: 8.2 10*3/uL (ref 3.8–10.6)

## 2017-04-19 LAB — COMPREHENSIVE METABOLIC PANEL
ALBUMIN: 4.1 g/dL (ref 3.5–5.0)
ALK PHOS: 114 U/L (ref 38–126)
ALT: 18 U/L (ref 17–63)
AST: 21 U/L (ref 15–41)
Anion gap: 10 (ref 5–15)
BILIRUBIN TOTAL: 0.6 mg/dL (ref 0.3–1.2)
BUN: 17 mg/dL (ref 6–20)
CO2: 29 mmol/L (ref 22–32)
Calcium: 9.7 mg/dL (ref 8.9–10.3)
Chloride: 100 mmol/L — ABNORMAL LOW (ref 101–111)
Creatinine, Ser: 1.32 mg/dL — ABNORMAL HIGH (ref 0.61–1.24)
GFR calc Af Amer: 60 mL/min — ABNORMAL LOW (ref >60)
GFR calc non Af Amer: 51 mL/min — ABNORMAL LOW (ref >60)
GLUCOSE: 104 mg/dL — AB (ref 65–99)
POTASSIUM: 4.5 mmol/L (ref 3.5–5.1)
SODIUM: 139 mmol/L (ref 135–145)
TOTAL PROTEIN: 7.7 g/dL (ref 6.5–8.1)

## 2017-04-19 LAB — TYPE AND SCREEN
ABO/RH(D): O NEG
ANTIBODY SCREEN: NEGATIVE

## 2017-04-19 LAB — PROTIME-INR
INR: 0.93
Prothrombin Time: 12.5 seconds (ref 11.4–15.2)

## 2017-04-19 LAB — APTT: APTT: 32 s (ref 24–36)

## 2017-04-19 NOTE — Patient Instructions (Addendum)
  Your procedure is scheduled on: Wednesday May 02, 2017. Report to Same Day Surgery. To find out your arrival time please call [phone] between 1PM - 3PM on Tuesday May 01, 2017.  Remember: Instructions that are not followed completely may result in serious medical risk, up to and including death, or upon the discretion of your surgeon and anesthesiologist your surgery may need to be rescheduled.    _x___ 1. Do not eat food or drink liquids after midnight. No gum chewing or hard candies.     ____ 2. No Alcohol for 24 hours before or after surgery.   ____ 3. Bring all medications with you on the day of surgery if instructed.    __x__ 4. Notify your doctor if there is any change in your medical condition     (cold, fever, infections).    _____ 5. No smoking 24 hours prior to surgery.     Do not wear jewelry, make-up, hairpins, clips or nail polish.  Do not wear lotions, powders, or perfumes.   Do not shave 48 hours prior to surgery. Men may shave face and neck.  Do not bring valuables to the hospital.    [facility] is not responsible for any belongings or valuables.               Contacts, dentures or bridgework may not be worn into surgery.  Leave your suitcase in the car. After surgery it may be brought to your room.  For patients admitted to the hospital, discharge time is determined by your treatment team.   Patients discharged the day of surgery will not be allowed to drive home.    Please read over the following fact sheets that you were given:   [facility] Preparing for Surgery  _x___ Take these medicines the morning of surgery with A SIP OF WATER:    1. buPROPion (WELLBUTRIN SR)  2. levothyroxine (SYNTHROID, LEVOTHROID)   3. RABEprazole (ACIPHEX)   4. ramipril (ALTACE)  5. sertraline (ZOLOFT)  6. HYDROcodone-acetaminophen (NORCO) optional, no food  ____ Fleet Enema (as directed)   _x___ Use CHG Soap as directed on instruction sheet  ____ Use inhalers on  the day of surgery and bring to hospital day of surgery  ____ Stop metformin 2 days prior to surgery    ____ Take 1/2 of usual insulin dose the night before surgery and none on the morning of surgery.   _x___ Stop aspirin on 04/25/17 per Dr. Carlynn Purl office instructions  _x__ Stop Anti-inflammatories such as Advil, Aleve, Ibuprofen, Motrin, Naproxen,  Naprosyn, Goodies powders or aspirin products. OK to take Tylenol.   _x__ Stop supplements: PEPPERMINT OIL until after surgery.    ____ Bring C-Pap to the hospital.

## 2017-04-19 NOTE — Pre-Procedure Instructions (Signed)
Cardiac clearance from Dr. Saralyn Pilar is on front of pt's chart.

## 2017-04-19 NOTE — Pre-Procedure Instructions (Signed)
2018 July NM myocardial perfusion SPECT multiple (stress and rest)04/18/2017 [place] Result Impression   1.Moderate reduced left ventricular function 2.Global hypokinesis 3.Moderate inferior ischemia  Result Narrative  CARDIOLOGY DEPARTMENT [facility] [address] [place] [id number] [phone]  Procedure: Pharmacologic Myocardial Perfusion Imaging ONE day procedure  Indication: Preop cardiovascular exam Plan: NM myocardial perfusion SPECT multiple (stress  and rest), ECG stress test only  Coronary artery disease involving native coronary artery of native heart  without angina pectoris Plan: NM myocardial perfusion SPECT multiple (stress  and rest), ECG stress test only  S/P CABG x 3 Plan: NM myocardial perfusion SPECT multiple (stress  and rest), ECG stress test only  Ordering Physician:   Dr. Isaias Cowman   Clinical History: 76 y.o. year old male Vitals: Height: 71 in Weight: 166 lb Cardiac risk factors include:  CHF, Hyperlipidemia, HTN, CABG and CAD    Procedure:  Pharmacologic stress testing was performed with Regadenoson using a single  use 0.4mg /39ml (0.08 mg/ml) prefilled syringe intravenously infused as a  bolus dose. The stress test was stopped due to Infusion completion.Blood  pressure response was normal. The patient did not develop any symptoms  other than fatigue during the procedure.   Rest HR: 76bpm Rest BP: 110/40mmHg Max HR: 86bpm Min BP: 120/35mmHg  Stress Test Administered by: Oswald Hillock, CMA  ECG Interpretation: Rest QTM:AUQJFH sinus rhythm, none Stress LKT:GYBWLS sinus rhythm,  Recovery LHT:DSKAJG sinus rhythm ECG Interpretation:non-diagnostic due to pharmacologic testing.   Administrations This Visit  regadenoson (LEXISCAN) 0.4 mg/5 mL inj syringe 0.4 mg  Admin Date 04/18/2017 Action Given Dose 0.4 mg  Route Intravenous Administered By Herbert Seta, CNMT     technetium Tc24m sestamibi (CARDIOLITE) injection 81.15 millicurie  Admin Date 72/62/0355 Action Given Dose 97.41 millicurie Route Intravenous Administered By Herbert Seta, CNMT     technetium Tc79m sestamibi (CARDIOLITE) injection 63.84 millicurie  Admin Date 53/64/6803 Action Given Dose 21.22 millicurie Route Intravenous Administered By Ane Payment, CNMT       Gated post-stress perfusion imaging was performed 30 minutes after stress.  Rest images were performed 30 minutes after injection.  Gated LV Analysis:  TID Ratio:   LVEF= 33%  FINDINGS: Regional wall motion:reveals normal myocardial thickening and wall  motion. The overall quality of the study is good. Artifacts noted: no Left ventricular cavity: enlarged.  Perfusion Analysis:SPECT images demonstrate moderate perfusion  abnormality of moderate intensity is present in the inferior region on the  stress images.  Status

## 2017-04-20 ENCOUNTER — Inpatient Hospital Stay: Admission: RE | Admit: 2017-04-20 | Payer: Medicare Other | Source: Ambulatory Visit

## 2017-04-20 LAB — URINE CULTURE: Culture: <10000 — AB

## 2017-04-23 ENCOUNTER — Ambulatory Visit: Admission: RE | Admit: 2017-04-23 | Payer: Medicare Other | Source: Ambulatory Visit | Admitting: Gastroenterology

## 2017-04-23 ENCOUNTER — Encounter: Admission: RE | Payer: Self-pay | Source: Ambulatory Visit

## 2017-04-23 SURGERY — ESOPHAGOGASTRODUODENOSCOPY (EGD) WITH PROPOFOL
Anesthesia: General

## 2017-04-25 ENCOUNTER — Encounter
Admission: RE | Disposition: A | Discharge status: Home / Self Care | Payer: Self-pay | Source: Ambulatory Visit | Attending: Cardiology

## 2017-04-25 ENCOUNTER — Encounter: Payer: Self-pay | Admitting: Cardiology

## 2017-04-25 ENCOUNTER — Other Ambulatory Visit: Payer: Medicare Other

## 2017-04-25 ENCOUNTER — Ambulatory Visit
Admission: RE | Admit: 2017-04-25 | Discharge: 2017-04-25 | Disposition: A | Discharge status: Home / Self Care | Payer: Medicare Other | Source: Ambulatory Visit | Attending: Cardiology | Admitting: Cardiology

## 2017-04-25 DIAGNOSIS — I110 Hypertensive heart disease with heart failure: Secondary | ICD-10-CM | POA: Diagnosis not present

## 2017-04-25 DIAGNOSIS — J449 Chronic obstructive pulmonary disease, unspecified: Secondary | ICD-10-CM | POA: Insufficient documentation

## 2017-04-25 DIAGNOSIS — E785 Hyperlipidemia, unspecified: Secondary | ICD-10-CM | POA: Insufficient documentation

## 2017-04-25 DIAGNOSIS — I2582 Chronic total occlusion of coronary artery: Secondary | ICD-10-CM | POA: Insufficient documentation

## 2017-04-25 DIAGNOSIS — N400 Enlarged prostate without lower urinary tract symptoms: Secondary | ICD-10-CM | POA: Diagnosis not present

## 2017-04-25 DIAGNOSIS — E039 Hypothyroidism, unspecified: Secondary | ICD-10-CM | POA: Insufficient documentation

## 2017-04-25 DIAGNOSIS — I2510 Atherosclerotic heart disease of native coronary artery without angina pectoris: Secondary | ICD-10-CM | POA: Diagnosis present

## 2017-04-25 DIAGNOSIS — Z7982 Long term (current) use of aspirin: Secondary | ICD-10-CM | POA: Diagnosis not present

## 2017-04-25 DIAGNOSIS — F329 Major depressive disorder, single episode, unspecified: Secondary | ICD-10-CM | POA: Diagnosis not present

## 2017-04-25 DIAGNOSIS — M1990 Unspecified osteoarthritis, unspecified site: Secondary | ICD-10-CM | POA: Diagnosis not present

## 2017-04-25 DIAGNOSIS — C641 Malignant neoplasm of right kidney, except renal pelvis: Secondary | ICD-10-CM | POA: Insufficient documentation

## 2017-04-25 DIAGNOSIS — I25810 Atherosclerosis of coronary artery bypass graft(s) without angina pectoris: Secondary | ICD-10-CM | POA: Insufficient documentation

## 2017-04-25 DIAGNOSIS — I5042 Chronic combined systolic (congestive) and diastolic (congestive) heart failure: Secondary | ICD-10-CM | POA: Diagnosis not present

## 2017-04-25 HISTORY — PX: LEFT HEART CATH AND CORS/GRAFTS ANGIOGRAPHY: CATH118250

## 2017-04-25 SURGERY — LEFT HEART CATH AND CORS/GRAFTS ANGIOGRAPHY
Anesthesia: Moderate Sedation

## 2017-04-25 MED ORDER — HEPARIN (PORCINE) IN NACL 2-0.9 UNIT/ML-% IJ SOLN
INTRAMUSCULAR | Status: AC
  Filled 2017-04-25: qty 500

## 2017-04-25 MED ORDER — FENTANYL CITRATE (PF) 100 MCG/2ML IJ SOLN
INTRAMUSCULAR | Status: AC
  Filled 2017-04-25: qty 2

## 2017-04-25 MED ORDER — ASPIRIN 81 MG PO CHEW
81.0000 mg | CHEWABLE_TABLET | ORAL | Status: AC
  Administered 2017-04-25: 81 mg via ORAL

## 2017-04-25 MED ORDER — ACETAMINOPHEN 325 MG PO TABS: 650.0000 mg | ORAL_TABLET | ORAL | Status: DC | PRN

## 2017-04-25 MED ORDER — ASPIRIN 81 MG PO CHEW
CHEWABLE_TABLET | ORAL | Status: AC
  Administered 2017-04-25: 81 mg via ORAL
  Filled 2017-04-25: qty 1

## 2017-04-25 MED ORDER — SODIUM CHLORIDE 0.9 % WEIGHT BASED INFUSION
3.0000 mL/kg/h | INTRAVENOUS | Status: DC
  Administered 2017-04-25: 3 mL/kg/h via INTRAVENOUS

## 2017-04-25 MED ORDER — SODIUM CHLORIDE 0.9 % WEIGHT BASED INFUSION: 1.0000 mL/kg/h | INTRAVENOUS | Status: DC

## 2017-04-25 MED ORDER — SODIUM CHLORIDE 0.9% FLUSH: 3.0000 mL | Freq: Two times a day (BID) | INTRAVENOUS | Status: DC

## 2017-04-25 MED ORDER — SODIUM CHLORIDE 0.9% FLUSH: 3.0000 mL | INTRAVENOUS | Status: DC | PRN

## 2017-04-25 MED ORDER — SODIUM CHLORIDE 0.9 % IV SOLN: 250.0000 mL | INTRAVENOUS | Status: DC | PRN

## 2017-04-25 MED ORDER — ONDANSETRON HCL 4 MG/2ML IJ SOLN: 4.0000 mg | Freq: Four times a day (QID) | INTRAMUSCULAR | Status: DC | PRN

## 2017-04-25 MED ORDER — MIDAZOLAM HCL 2 MG/2ML IJ SOLN
INTRAMUSCULAR | Status: AC
  Filled 2017-04-25: qty 2

## 2017-04-25 MED ORDER — MIDAZOLAM HCL 2 MG/2ML IJ SOLN
INTRAMUSCULAR | Status: DC | PRN
Start: 2017-04-25 — End: 2017-04-25
  Administered 2017-04-25: 1 mg via INTRAVENOUS

## 2017-04-25 MED ORDER — FENTANYL CITRATE (PF) 100 MCG/2ML IJ SOLN
INTRAMUSCULAR | Status: DC | PRN
  Administered 2017-04-25: 50 ug via INTRAVENOUS

## 2017-04-25 MED ORDER — IOPAMIDOL (ISOVUE-300) INJECTION 61%
INTRAVENOUS | Status: DC | PRN
  Administered 2017-04-25: 110 mL via INTRA_ARTERIAL

## 2017-04-25 SURGICAL SUPPLY — 8 items
CATH 5FR JR4 DIAGNOSTIC (CATHETERS) ×3 IMPLANT
CATH INFINITI 5FR ANG PIGTAIL (CATHETERS) ×3 IMPLANT
CATH INFINITI 5FR JL4 (CATHETERS) ×3 IMPLANT
GUIDEWIRE 3MM J TIP .035 145 (WIRE) ×3 IMPLANT
KIT MANI 3VAL PERCEP (MISCELLANEOUS) ×3 IMPLANT
NEEDLE PERC 18GX7CM (NEEDLE) ×3 IMPLANT
PACK CARDIAC CATH (CUSTOM PROCEDURE TRAY) ×3 IMPLANT
SHEATH AVANTI 5FR X 11CM (SHEATH) ×3 IMPLANT

## 2017-04-25 NOTE — Discharge Instructions (Signed)
Angiogram, Care After °This sheet gives you information about how to care for yourself after your procedure. Your health care provider may also give you more specific instructions. If you have problems or questions, contact your health care provider. °What can I expect after the procedure? °After the procedure, it is common to have bruising and tenderness at the catheter insertion area. °Follow these instructions at home: °Insertion site care °· Follow instructions from your health care provider about how to take care of your insertion site. Make sure you: °? Wash your hands with soap and water before you change your bandage (dressing). If soap and water are not available, use hand sanitizer. °? Change your dressing as told by your health care provider. °? Leave stitches (sutures), skin glue, or adhesive strips in place. These skin closures may need to stay in place for 2 weeks or longer. If adhesive strip edges start to loosen and curl up, you may trim the loose edges. Do not remove adhesive strips completely unless your health care provider tells you to do that. °· Do not take baths, swim, or use a hot tub until your health care provider approves. °· You may shower 24-48 hours after the procedure or as told by your health care provider. °? Gently wash the site with plain soap and water. °? Pat the area dry with a clean towel. °? Do not rub the site. This may cause bleeding. °· Do not apply powder or lotion to the site. Keep the site clean and dry. °· Check your insertion site every day for signs of infection. Check for: °? Redness, swelling, or pain. °? Fluid or blood. °? Warmth. °? Pus or a bad smell. °Activity °· Rest as told by your health care provider, usually for 1-2 days. °· Do not lift anything that is heavier than 10 lbs. (4.5 kg) or as told by your health care provider. °· Do not drive for 24 hours if you were given a medicine to help you relax (sedative). °· Do not drive or use heavy machinery while  taking prescription pain medicine. °General instructions °· Return to your normal activities as told by your health care provider, usually in about a week. Ask your health care provider what activities are safe for you. °· If the catheter site starts bleeding, lie flat and put pressure on the site. If the bleeding does not stop, get help right away. This is a medical emergency. °· Drink enough fluid to keep your urine clear or pale yellow. This helps flush the contrast dye from your body. °· Take over-the-counter and prescription medicines only as told by your health care provider. °· Keep all follow-up visits as told by your health care provider. This is important. °Contact a health care provider if: °· You have a fever or chills. °· You have redness, swelling, or pain around your insertion site. °· You have fluid or blood coming from your insertion site. °· The insertion site feels warm to the touch. °· You have pus or a bad smell coming from your insertion site. °· You have bruising around the insertion site. °· You notice blood collecting in the tissue around the catheter site (hematoma). The hematoma may be painful to the touch. °Get help right away if: °· You have severe pain at the catheter insertion area. °· The catheter insertion area swells very fast. °· The catheter insertion area is bleeding, and the bleeding does not stop when you hold steady pressure on the area. °·   The area near or just beyond the catheter insertion site becomes pale, cool, tingly, or numb. °These symptoms may represent a serious problem that is an emergency. Do not wait to see if the symptoms will go away. Get medical help right away. Call your local emergency services (911 in the [place] Do not drive yourself to the hospital. °Summary °· After the procedure, it is common to have bruising and tenderness at the catheter insertion area. °· After the procedure, it is important to rest and drink plenty of fluids. °· Do not take baths,  swim, or use a hot tub until your health care provider says it is okay to do so. You may shower 24-48 hours after the procedure or as told by your health care provider. °· If the catheter site starts bleeding, lie flat and put pressure on the site. If the bleeding does not stop, get help right away. This is a medical emergency. °This information is not intended to replace advice given to you by your health care provider. Make sure you discuss any questions you have with your health care provider. °Document Released: 03/30/2005 Document Revised: 08/16/2016 Document Reviewed: 08/16/2016 °Elsevier Interactive Patient Education © 2017 [employer] °Moderate Conscious Sedation, Adult, Care After °These instructions provide you with information about caring for yourself after your procedure. Your health care provider may also give you more specific instructions. Your treatment has been planned according to current medical practices, but problems sometimes occur. Call your health care provider if you have any problems or questions after your procedure. °What can I expect after the procedure? °After your procedure, it is common: °· To feel sleepy for several hours. °· To feel clumsy and have poor balance for several hours. °· To have poor judgment for several hours. °· To vomit if you eat too soon. ° °Follow these instructions at home: °For at least 24 hours after the procedure: ° °· Do not: °? Participate in activities where you could fall or become injured. °? Drive. °? Use heavy machinery. °? Drink alcohol. °? Take sleeping pills or medicines that cause drowsiness. °? Make important decisions or sign legal documents. °? Take care of children on your own. °· Rest. °Eating and drinking °· Follow the diet recommended by your health care provider. °· If you vomit: °? Drink water, juice, or soup when you can drink without vomiting. °? Make sure you have little or no nausea before eating solid foods. °General  instructions °· Have a responsible adult stay with you until you are awake and alert. °· Take over-the-counter and prescription medicines only as told by your health care provider. °· If you smoke, do not smoke without supervision. °· Keep all follow-up visits as told by your health care provider. This is important. °Contact a health care provider if: °· You keep feeling nauseous or you keep vomiting. °· You feel light-headed. °· You develop a rash. °· You have a fever. °Get help right away if: °· You have trouble breathing. °This information is not intended to replace advice given to you by your health care provider. Make sure you discuss any questions you have with your health care provider. °Document Released: 07/02/2013 Document Revised: 02/14/2016 Document Reviewed: 01/01/2016 °Elsevier Interactive Patient Education © 2018 [employer]. ° ° °

## 2017-05-01 MED ORDER — CEFOXITIN SODIUM-DEXTROSE 2-2.2 GM-% IV SOLR (PREMIX)
2.0000 g | INTRAVENOUS | Status: AC
  Administered 2017-05-02: 2 g via INTRAVENOUS

## 2017-05-02 ENCOUNTER — Inpatient Hospital Stay: Payer: Medicare Other | Admitting: Anesthesiology

## 2017-05-02 ENCOUNTER — Inpatient Hospital Stay
Admission: RE | Admit: 2017-05-02 | Discharge: 2017-05-04 | DRG: 674 | Disposition: A | Discharge status: Home / Self Care | Payer: Medicare Other | Attending: Urology | Admitting: Urology

## 2017-05-02 ENCOUNTER — Encounter
Admission: RE | Disposition: A | Discharge status: Home / Self Care | Payer: Self-pay | Source: Home / Self Care | Attending: Urology

## 2017-05-02 DIAGNOSIS — C651 Malignant neoplasm of right renal pelvis: Principal | ICD-10-CM | POA: Diagnosis present

## 2017-05-02 DIAGNOSIS — N319 Neuromuscular dysfunction of bladder, unspecified: Secondary | ICD-10-CM | POA: Diagnosis present

## 2017-05-02 DIAGNOSIS — C7989 Secondary malignant neoplasm of other specified sites: Secondary | ICD-10-CM | POA: Diagnosis present

## 2017-05-02 DIAGNOSIS — R7303 Prediabetes: Secondary | ICD-10-CM | POA: Diagnosis present

## 2017-05-02 DIAGNOSIS — R310 Gross hematuria: Secondary | ICD-10-CM | POA: Diagnosis present

## 2017-05-02 DIAGNOSIS — K4090 Unilateral inguinal hernia, without obstruction or gangrene, not specified as recurrent: Secondary | ICD-10-CM | POA: Diagnosis present

## 2017-05-02 DIAGNOSIS — N400 Enlarged prostate without lower urinary tract symptoms: Secondary | ICD-10-CM | POA: Diagnosis present

## 2017-05-02 DIAGNOSIS — Z951 Presence of aortocoronary bypass graft: Secondary | ICD-10-CM | POA: Diagnosis not present

## 2017-05-02 DIAGNOSIS — E039 Hypothyroidism, unspecified: Secondary | ICD-10-CM | POA: Diagnosis present

## 2017-05-02 DIAGNOSIS — Z8711 Personal history of peptic ulcer disease: Secondary | ICD-10-CM | POA: Diagnosis not present

## 2017-05-02 DIAGNOSIS — F419 Anxiety disorder, unspecified: Secondary | ICD-10-CM | POA: Diagnosis present

## 2017-05-02 DIAGNOSIS — Z79899 Other long term (current) drug therapy: Secondary | ICD-10-CM | POA: Diagnosis not present

## 2017-05-02 DIAGNOSIS — E785 Hyperlipidemia, unspecified: Secondary | ICD-10-CM | POA: Diagnosis present

## 2017-05-02 DIAGNOSIS — I2510 Atherosclerotic heart disease of native coronary artery without angina pectoris: Secondary | ICD-10-CM | POA: Diagnosis present

## 2017-05-02 DIAGNOSIS — J449 Chronic obstructive pulmonary disease, unspecified: Secondary | ICD-10-CM | POA: Diagnosis present

## 2017-05-02 DIAGNOSIS — Z87891 Personal history of nicotine dependence: Secondary | ICD-10-CM | POA: Diagnosis not present

## 2017-05-02 DIAGNOSIS — C775 Secondary and unspecified malignant neoplasm of intrapelvic lymph nodes: Secondary | ICD-10-CM | POA: Diagnosis present

## 2017-05-02 DIAGNOSIS — C641 Malignant neoplasm of right kidney, except renal pelvis: Secondary | ICD-10-CM | POA: Diagnosis present

## 2017-05-02 DIAGNOSIS — F329 Major depressive disorder, single episode, unspecified: Secondary | ICD-10-CM | POA: Diagnosis present

## 2017-05-02 DIAGNOSIS — E119 Type 2 diabetes mellitus without complications: Secondary | ICD-10-CM | POA: Diagnosis present

## 2017-05-02 DIAGNOSIS — K429 Umbilical hernia without obstruction or gangrene: Secondary | ICD-10-CM | POA: Diagnosis present

## 2017-05-02 DIAGNOSIS — Z7982 Long term (current) use of aspirin: Secondary | ICD-10-CM | POA: Diagnosis not present

## 2017-05-02 HISTORY — PX: ROBOT ASSITED LAPAROSCOPIC NEPHROURETERECTOMY: SHX6077

## 2017-05-02 LAB — GLUCOSE, CAPILLARY
Glucose-Capillary: 108 mg/dL — ABNORMAL HIGH (ref 65–99)
Glucose-Capillary: 175 mg/dL — ABNORMAL HIGH (ref 65–99)

## 2017-05-02 SURGERY — ROBOT ASSITED LAPAROSCOPIC NEPHROURETERECTOMY
Anesthesia: General | Site: Abdomen | Laterality: Right | Wound class: Clean Contaminated

## 2017-05-02 MED ORDER — HYDROCODONE-ACETAMINOPHEN 5-325 MG PO TABS
1.0000 | ORAL_TABLET | ORAL | Status: DC | PRN
  Administered 2017-05-02 (×2): 1 via ORAL
  Administered 2017-05-03: 2 via ORAL
  Administered 2017-05-03: 1 via ORAL
  Administered 2017-05-03 – 2017-05-04 (×4): 2 via ORAL
  Filled 2017-05-02: qty 2
  Filled 2017-05-02 (×2): qty 1
  Filled 2017-05-02: qty 2
  Filled 2017-05-02: qty 1
  Filled 2017-05-02 (×3): qty 2

## 2017-05-02 MED ORDER — LIDOCAINE HCL (PF) 2 % IJ SOLN
INTRAMUSCULAR | Status: AC
  Filled 2017-05-02: qty 2

## 2017-05-02 MED ORDER — LACTATED RINGERS IV SOLN
INTRAVENOUS | Status: DC | PRN
  Administered 2017-05-02: 08:00:00 via INTRAVENOUS

## 2017-05-02 MED ORDER — ROCURONIUM BROMIDE 50 MG/5ML IV SOLN
INTRAVENOUS | Status: AC
  Filled 2017-05-02: qty 1

## 2017-05-02 MED ORDER — EZETIMIBE-SIMVASTATIN 10-40 MG PO TABS: 1.0000 | ORAL_TABLET | Freq: Every day | ORAL | Status: DC

## 2017-05-02 MED ORDER — ONDANSETRON HCL 4 MG/2ML IJ SOLN
INTRAMUSCULAR | Status: AC
  Filled 2017-05-02: qty 2

## 2017-05-02 MED ORDER — PANTOPRAZOLE SODIUM 40 MG PO TBEC
40.0000 mg | DELAYED_RELEASE_TABLET | Freq: Every day | ORAL | Status: DC
  Administered 2017-05-03 – 2017-05-04 (×2): 40 mg via ORAL
  Filled 2017-05-02 (×2): qty 1

## 2017-05-02 MED ORDER — SODIUM CHLORIDE FLUSH 0.9 % IV SOLN
INTRAVENOUS | Status: AC
  Filled 2017-05-02: qty 10

## 2017-05-02 MED ORDER — HYDROMORPHONE HCL 1 MG/ML IJ SOLN
INTRAMUSCULAR | Status: AC
  Filled 2017-05-02: qty 1

## 2017-05-02 MED ORDER — FENTANYL CITRATE (PF) 100 MCG/2ML IJ SOLN
INTRAMUSCULAR | Status: DC | PRN
  Administered 2017-05-02 (×5): 50 ug via INTRAVENOUS

## 2017-05-02 MED ORDER — FENTANYL CITRATE (PF) 100 MCG/2ML IJ SOLN
INTRAMUSCULAR | Status: AC
  Administered 2017-05-02: 25 ug via INTRAVENOUS
  Filled 2017-05-02: qty 2

## 2017-05-02 MED ORDER — TAMSULOSIN HCL 0.4 MG PO CAPS
0.8000 mg | ORAL_CAPSULE | Freq: Every day | ORAL | Status: DC
  Administered 2017-05-02 – 2017-05-03 (×2): 0.8 mg via ORAL
  Filled 2017-05-02 (×2): qty 2

## 2017-05-02 MED ORDER — ONDANSETRON HCL 4 MG/2ML IJ SOLN: 4.0000 mg | Freq: Once | INTRAMUSCULAR | Status: DC | PRN

## 2017-05-02 MED ORDER — LEVOTHYROXINE SODIUM 100 MCG PO TABS
100.0000 ug | ORAL_TABLET | Freq: Every day | ORAL | Status: DC
  Administered 2017-05-03 – 2017-05-04 (×2): 100 ug via ORAL
  Filled 2017-05-02 (×2): qty 1

## 2017-05-02 MED ORDER — SERTRALINE HCL 50 MG PO TABS
100.0000 mg | ORAL_TABLET | Freq: Every day | ORAL | Status: DC
  Administered 2017-05-03 – 2017-05-04 (×2): 100 mg via ORAL
  Filled 2017-05-02 (×2): qty 2

## 2017-05-02 MED ORDER — ROCURONIUM BROMIDE 100 MG/10ML IV SOLN
INTRAVENOUS | Status: DC | PRN
  Administered 2017-05-02: 10 mg via INTRAVENOUS
  Administered 2017-05-02: 50 mg via INTRAVENOUS
  Administered 2017-05-02 (×2): 10 mg via INTRAVENOUS
  Administered 2017-05-02 (×2): 20 mg via INTRAVENOUS

## 2017-05-02 MED ORDER — SODIUM CHLORIDE 0.9 % IJ SOLN
INTRAMUSCULAR | Status: AC
  Filled 2017-05-02: qty 50

## 2017-05-02 MED ORDER — PROPOFOL 10 MG/ML IV BOLUS
INTRAVENOUS | Status: AC
  Filled 2017-05-02: qty 20

## 2017-05-02 MED ORDER — FENTANYL CITRATE (PF) 250 MCG/5ML IJ SOLN
INTRAMUSCULAR | Status: AC
  Filled 2017-05-02: qty 5

## 2017-05-02 MED ORDER — HYOSCYAMINE SULFATE 0.125 MG PO TBDP
0.1250 mg | ORAL_TABLET | Freq: Four times a day (QID) | ORAL | Status: DC | PRN
  Filled 2017-05-02: qty 1

## 2017-05-02 MED ORDER — HYDROMORPHONE HCL 1 MG/ML IJ SOLN: 0.5000 mg | INTRAMUSCULAR | Status: DC | PRN

## 2017-05-02 MED ORDER — TRAMADOL HCL 50 MG PO TABS: 50.0000 mg | ORAL_TABLET | Freq: Two times a day (BID) | ORAL | Status: DC | PRN

## 2017-05-02 MED ORDER — PHENYLEPHRINE HCL 10 MG/ML IJ SOLN
INTRAMUSCULAR | Status: DC | PRN
  Administered 2017-05-02: 100 ug via INTRAVENOUS

## 2017-05-02 MED ORDER — CYCLOBENZAPRINE HCL 10 MG PO TABS: 10.0000 mg | ORAL_TABLET | Freq: Every evening | ORAL | Status: DC | PRN

## 2017-05-02 MED ORDER — SUGAMMADEX SODIUM 200 MG/2ML IV SOLN
INTRAVENOUS | Status: AC
  Filled 2017-05-02: qty 2

## 2017-05-02 MED ORDER — CEFOXITIN SODIUM-DEXTROSE 2-2.2 GM-% IV SOLR (PREMIX)
INTRAVENOUS | Status: AC
  Filled 2017-05-02: qty 50

## 2017-05-02 MED ORDER — ONDANSETRON HCL 4 MG/2ML IJ SOLN
4.0000 mg | INTRAMUSCULAR | Status: DC | PRN
  Administered 2017-05-02: 4 mg via INTRAVENOUS
  Filled 2017-05-02: qty 2

## 2017-05-02 MED ORDER — RAMIPRIL 10 MG PO CAPS
10.0000 mg | ORAL_CAPSULE | Freq: Every day | ORAL | Status: DC
  Administered 2017-05-03 – 2017-05-04 (×2): 10 mg via ORAL
  Filled 2017-05-02 (×2): qty 1

## 2017-05-02 MED ORDER — HYDROMORPHONE HCL 1 MG/ML IJ SOLN
INTRAMUSCULAR | Status: DC | PRN
  Administered 2017-05-02 (×2): 0.5 mg via INTRAVENOUS

## 2017-05-02 MED ORDER — SIMVASTATIN 40 MG PO TABS
40.0000 mg | ORAL_TABLET | Freq: Every day | ORAL | Status: DC
  Administered 2017-05-02 – 2017-05-03 (×2): 40 mg via ORAL
  Filled 2017-05-02 (×3): qty 1

## 2017-05-02 MED ORDER — THROMBIN 5000 UNITS EX SOLR
CUTANEOUS | Status: AC
  Filled 2017-05-02: qty 5000

## 2017-05-02 MED ORDER — ACETAMINOPHEN 10 MG/ML IV SOLN
INTRAVENOUS | Status: DC | PRN
  Administered 2017-05-02: 1000 mg via INTRAVENOUS

## 2017-05-02 MED ORDER — ONDANSETRON HCL 4 MG/2ML IJ SOLN
INTRAMUSCULAR | Status: DC | PRN
  Administered 2017-05-02: 4 mg via INTRAVENOUS

## 2017-05-02 MED ORDER — CEFAZOLIN SODIUM-DEXTROSE 2-4 GM/100ML-% IV SOLN
2.0000 g | Freq: Three times a day (TID) | INTRAVENOUS | Status: DC
  Administered 2017-05-02 – 2017-05-04 (×5): 2 g via INTRAVENOUS
  Filled 2017-05-02 (×9): qty 100

## 2017-05-02 MED ORDER — BUPIVACAINE LIPOSOME 1.3 % IJ SUSP
INTRAMUSCULAR | Status: AC
  Filled 2017-05-02: qty 20

## 2017-05-02 MED ORDER — GLYCOPYRROLATE 0.2 MG/ML IJ SOLN
INTRAMUSCULAR | Status: AC
  Filled 2017-05-02: qty 1

## 2017-05-02 MED ORDER — DEXAMETHASONE SODIUM PHOSPHATE 10 MG/ML IJ SOLN
INTRAMUSCULAR | Status: AC
  Filled 2017-05-02: qty 1

## 2017-05-02 MED ORDER — FENTANYL CITRATE (PF) 100 MCG/2ML IJ SOLN
25.0000 ug | INTRAMUSCULAR | Status: AC | PRN
  Administered 2017-05-02 (×6): 25 ug via INTRAVENOUS

## 2017-05-02 MED ORDER — HEPARIN SODIUM (PORCINE) 5000 UNIT/ML IJ SOLN
5000.0000 [IU] | Freq: Three times a day (TID) | INTRAMUSCULAR | Status: DC
  Administered 2017-05-02 – 2017-05-04 (×5): 5000 [IU] via SUBCUTANEOUS
  Filled 2017-05-02 (×5): qty 1

## 2017-05-02 MED ORDER — EZETIMIBE 10 MG PO TABS
10.0000 mg | ORAL_TABLET | Freq: Every day | ORAL | Status: DC
  Administered 2017-05-02 – 2017-05-03 (×2): 10 mg via ORAL
  Filled 2017-05-02 (×3): qty 1

## 2017-05-02 MED ORDER — EPHEDRINE SULFATE 50 MG/ML IJ SOLN
INTRAMUSCULAR | Status: AC
  Filled 2017-05-02: qty 1

## 2017-05-02 MED ORDER — MORPHINE SULFATE (PF) 2 MG/ML IV SOLN
2.0000 mg | INTRAVENOUS | Status: DC | PRN
  Administered 2017-05-03: 2 mg via INTRAVENOUS
  Filled 2017-05-02: qty 1

## 2017-05-02 MED ORDER — BUPROPION HCL ER (SR) 100 MG PO TB12
100.0000 mg | ORAL_TABLET | Freq: Every day | ORAL | Status: DC
Start: 2017-05-03 — End: 2017-05-04
  Administered 2017-05-03 – 2017-05-04 (×2): 100 mg via ORAL
  Filled 2017-05-02 (×2): qty 1

## 2017-05-02 MED ORDER — LIDOCAINE HCL (CARDIAC) 20 MG/ML IV SOLN
INTRAVENOUS | Status: DC | PRN
  Administered 2017-05-02: 50 mg via INTRAVENOUS

## 2017-05-02 MED ORDER — ACETAMINOPHEN 10 MG/ML IV SOLN
INTRAVENOUS | Status: AC
  Filled 2017-05-02: qty 100

## 2017-05-02 MED ORDER — BUPIVACAINE HCL (PF) 0.5 % IJ SOLN
INTRAMUSCULAR | Status: AC
  Filled 2017-05-02: qty 30

## 2017-05-02 MED ORDER — DEXAMETHASONE SODIUM PHOSPHATE 10 MG/ML IJ SOLN
INTRAMUSCULAR | Status: DC | PRN
  Administered 2017-05-02: 10 mg via INTRAVENOUS

## 2017-05-02 MED ORDER — DOCUSATE SODIUM 100 MG PO CAPS
100.0000 mg | ORAL_CAPSULE | Freq: Two times a day (BID) | ORAL | Status: DC
  Administered 2017-05-02 – 2017-05-04 (×4): 100 mg via ORAL
  Filled 2017-05-02 (×4): qty 1

## 2017-05-02 MED ORDER — ORAL CARE MOUTH RINSE
15.0000 mL | Freq: Two times a day (BID) | OROMUCOSAL | Status: DC
  Administered 2017-05-03 – 2017-05-04 (×2): 15 mL via OROMUCOSAL

## 2017-05-02 MED ORDER — PHENYLEPHRINE HCL 10 MG/ML IJ SOLN
INTRAMUSCULAR | Status: AC
  Filled 2017-05-02: qty 1

## 2017-05-02 MED ORDER — PROPOFOL 10 MG/ML IV BOLUS
INTRAVENOUS | Status: DC | PRN
  Administered 2017-05-02: 150 mg via INTRAVENOUS

## 2017-05-02 MED ORDER — SODIUM CHLORIDE 0.9 % IV SOLN
INTRAVENOUS | Status: DC
  Administered 2017-05-02 – 2017-05-03 (×2): via INTRAVENOUS

## 2017-05-02 MED ORDER — BUPIVACAINE HCL 0.5 % IJ SOLN
INTRAMUSCULAR | Status: DC | PRN
  Administered 2017-05-02: 20 mL

## 2017-05-02 MED ORDER — LACTATED RINGERS IV SOLN
INTRAVENOUS | Status: DC
  Administered 2017-05-02: 08:00:00 via INTRAVENOUS

## 2017-05-02 MED ORDER — GLYCOPYRROLATE 0.2 MG/ML IJ SOLN
INTRAMUSCULAR | Status: DC | PRN
  Administered 2017-05-02: 0.2 mg via INTRAVENOUS

## 2017-05-02 MED ORDER — SUGAMMADEX SODIUM 200 MG/2ML IV SOLN
INTRAVENOUS | Status: DC | PRN
  Administered 2017-05-02: 150 mg via INTRAVENOUS

## 2017-05-02 SURGICAL SUPPLY — 95 items
ANCHOR TIS RET SYS 1550ML (BAG) IMPLANT
APPLICATOR SURGIFLO ENDO (HEMOSTASIS) ×3 IMPLANT
APPLIER CLIP LOGIC TI 5 (MISCELLANEOUS) IMPLANT
BULB RESERV EVAC DRAIN JP 100C (MISCELLANEOUS) IMPLANT
CANISTER SUCT 1200ML W/VALVE (MISCELLANEOUS) ×3 IMPLANT
CATH FOL 3WAY LX 18X30 (CATHETERS) IMPLANT
CATH FOL 3WAY LX 20X30 (CATHETERS) ×3 IMPLANT
CATH TRAY 16F METER LATEX (MISCELLANEOUS) ×3 IMPLANT
CHLORAPREP W/TINT 26ML (MISCELLANEOUS) ×6 IMPLANT
CLIP LIGATING HEM O LOK PURPLE (MISCELLANEOUS) ×6 IMPLANT
CLIP LIGATING HEMO O LOK GREEN (MISCELLANEOUS) IMPLANT
CLIP SUT LAPRA TY ABSORB (SUTURE) IMPLANT
CORD BIP STRL DISP 12FT (MISCELLANEOUS) ×3 IMPLANT
CORD MONOPOLAR M/FML 12FT (MISCELLANEOUS) ×3 IMPLANT
COVER TIP SHEARS 8 DVNC (MISCELLANEOUS) ×1 IMPLANT
COVER TIP SHEARS 8MM DA VINCI (MISCELLANEOUS) ×2
CUTTER ECHEON FLEX ENDO 45 340 (ENDOMECHANICALS) ×3 IMPLANT
DEFOGGER SCOPE WARMER CLEARIFY (MISCELLANEOUS) ×6 IMPLANT
DERMABOND ADVANCED (GAUZE/BANDAGES/DRESSINGS) ×2
DERMABOND ADVANCED .7 DNX12 (GAUZE/BANDAGES/DRESSINGS) ×1 IMPLANT
DRAIN CHANNEL JP 19F (MISCELLANEOUS) IMPLANT
DRAPE SHEET LG 3/4 BI-LAMINATE (DRAPES) ×3 IMPLANT
DRAPE SURG 17X11 SM STRL (DRAPES) ×12 IMPLANT
DRIVER LRG NEEDLE DA VINCI (INSTRUMENTS) ×4
DRIVER NDLE LRG DVNC (INSTRUMENTS) ×2 IMPLANT
ELECT PAD DSPR THERM+ ADLT (MISCELLANEOUS) IMPLANT
ELECT REM PT RETURN 9FT ADLT (ELECTROSURGICAL) ×3
ELECTRODE REM PT RTRN 9FT ADLT (ELECTROSURGICAL) ×1 IMPLANT
FILTER LAP SMOKE EVAC STRL (MISCELLANEOUS) IMPLANT
GLOVE BIO SURGEON STRL SZ 6.5 (GLOVE) ×12 IMPLANT
GLOVE BIO SURGEONS STRL SZ 6.5 (GLOVE) ×6
GOWN SPEC L4 XLG W/TWL (GOWN DISPOSABLE) ×3 IMPLANT
GOWN STRL REUS W/ TWL LRG LVL3 (GOWN DISPOSABLE) ×6 IMPLANT
GOWN STRL REUS W/TWL LRG LVL3 (GOWN DISPOSABLE) ×12
GRASPER SUT TROCAR 14GX15 (MISCELLANEOUS) ×3 IMPLANT
HEMOSTAT SURGICEL 2X3 (HEMOSTASIS) ×3 IMPLANT
HOLDER FOLEY CATH W/STRAP (MISCELLANEOUS) ×3 IMPLANT
IRRIGATION STRYKERFLOW (MISCELLANEOUS) ×1 IMPLANT
IRRIGATOR STRYKERFLOW (MISCELLANEOUS) ×3
IV NS 1000ML (IV SOLUTION) ×4
IV NS 1000ML BAXH (IV SOLUTION) ×2 IMPLANT
IV NS IRRIG 3000ML ARTHROMATIC (IV SOLUTION) ×3 IMPLANT
KIT ACCESSORY DA VINCI DISP (KITS) ×2
KIT ACCESSORY DVNC DISP (KITS) ×1 IMPLANT
KIT PINK PAD W/HEAD ARE REST (MISCELLANEOUS) ×3
KIT PINK PAD W/HEAD ARM REST (MISCELLANEOUS) ×1 IMPLANT
KIT RM TURNOVER CYSTO AR (KITS) ×3 IMPLANT
LABEL OR SOLS (LABEL) ×3 IMPLANT
LOOP RED MAXI  1X406MM (MISCELLANEOUS) ×2
LOOP VESSEL MAXI 1X406 RED (MISCELLANEOUS) ×1 IMPLANT
NEEDLE HYPO 25X1 1.5 SAFETY (NEEDLE) ×3 IMPLANT
NEEDLE INSUFFLATION 14GA 120MM (NEEDLE) ×3 IMPLANT
NS IRRIG 500ML POUR BTL (IV SOLUTION) ×3 IMPLANT
PACK LAP CHOLECYSTECTOMY (MISCELLANEOUS) ×3 IMPLANT
PENCIL ELECTRO HAND CTR (MISCELLANEOUS) ×3 IMPLANT
PROGRASP ENDOWRIST DA VINCI (INSTRUMENTS) ×4
PROGRASP ENDOWRIST DVNC (INSTRUMENTS) ×2 IMPLANT
RELOAD WH ECHELON 45 (STAPLE) ×9 IMPLANT
RETRACTOR GRASP SM DA VINCI (INSTRUMENTS) ×2
RETRACTOR GRASP SM DVNC (INSTRUMENTS) ×1 IMPLANT
SCISSORS METZENBAUM CVD 33 (INSTRUMENTS) IMPLANT
SET CYSTO W/LG BORE CLAMP LF (SET/KITS/TRAYS/PACK) ×3 IMPLANT
SLEEVE ENDOPATH XCEL 5M (ENDOMECHANICALS) ×3 IMPLANT
SOLUTION ELECTROLUBE (MISCELLANEOUS) ×3 IMPLANT
SPOGE SURGIFLO 8M (HEMOSTASIS) ×2
SPONGE LAP 4X18 5PK (MISCELLANEOUS) IMPLANT
SPONGE SURGIFLO 8M (HEMOSTASIS) ×1 IMPLANT
SPONGE VERSALON 4X4 4PLY (MISCELLANEOUS) IMPLANT
STAPLER SKIN PROX 35W (STAPLE) ×3 IMPLANT
SUT DVC VLOC 90 3-0 CV23 UNDY (SUTURE) ×3 IMPLANT
SUT ETHILON 3-0 FS-10 30 BLK (SUTURE)
SUT MNCRL 4-0 (SUTURE) ×4
SUT MNCRL 4-0 27XMFL (SUTURE) ×2
SUT MNCRL AB 4-0 PS2 18 (SUTURE) ×6 IMPLANT
SUT PROLENE 3 0 CT 1 (SUTURE) ×3 IMPLANT
SUT PROLENE 5 0 RB 1 DA (SUTURE) ×6 IMPLANT
SUT VIC AB 0 CT1 36 (SUTURE) ×6 IMPLANT
SUT VIC AB 4-0 RB1 27 (SUTURE)
SUT VIC AB 4-0 RB1 27X BRD (SUTURE) IMPLANT
SUT VICRYL 0 AB UR-6 (SUTURE) ×6 IMPLANT
SUT VICRYL 3-0 CR8 SH (SUTURE) ×3 IMPLANT
SUT VICRYL PLUS ABS 0 54 (SUTURE) ×3 IMPLANT
SUTURE EHLN 3-0 FS-10 30 BLK (SUTURE) IMPLANT
SUTURE MNCRL 4-0 27XMF (SUTURE) ×2 IMPLANT
SYR 3ML LL SCALE MARK (SYRINGE) IMPLANT
TAPE CLOTH 10X20 WHT NS LF (TAPE) ×2 IMPLANT
TAPE CLOTH 2X10 WHT NS LF (TAPE) ×4
TROCAR 12M 150ML BLUNT (TROCAR) IMPLANT
TROCAR BLADELESS 15MM (ENDOMECHANICALS) ×3 IMPLANT
TROCAR DISP BLADELESS 8 DVNC (TROCAR) IMPLANT
TROCAR DISP BLADELESS 8MM (TROCAR)
TROCAR SL 12X100  BARIAT (TROCAR) IMPLANT
TROCAR XCEL 12X100 BLDLESS (ENDOMECHANICALS) ×3 IMPLANT
TROCAR XCEL NON-BLD 5MMX100MML (ENDOMECHANICALS) ×3 IMPLANT
TUBING INSUFFLATOR HI FLOW (MISCELLANEOUS) ×3 IMPLANT

## 2017-05-02 NOTE — OR Nursing (Signed)
Blood sugar reported to dr Marcello Moores

## 2017-05-02 NOTE — Op Note (Addendum)
Date of procedure: 05/02/17  Preoperative diagnosis:  1. 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 Metastatic disease to right hilar lymph nodes/fat   Procedure: 1. Attempted right robotic nephroureterectomy 2. Intraoperative biopsy of hilar lymph node/fat  Surgeon: Baruch Gouty, MD  Assistant: Hollice Espy, MD  Anesthesia: General  Complications: None  Intraoperative findings: The patient was found to have unresectable disease. His disease had circumferentially surrounded his renal artery and veins and possibly invaded into the IVC. The renal artery and veins were unable to be isolated for successful ligation. Intraoperative frozen sections were sent which confirmed metastatic disease to right hilar pelvic fascia was lymph node tissue. At this point since the patient had metastatic disease in unresectable tumor, the procedure was aborted.   EBL: 100 cc  Specimens: Intraoperative frozen section of right hilar fat/lymph nodes as well as permanent biopsy of right hilar fat/lymph nodes   Drains: 20 French three-way Foley catheter with third port plugged.  Disposition: Stable to the postanesthesia care unit  Indication for procedure: The patient is a 76 y.o. male with history of confirmed a right high-grade upper tract TCC with no evidence since of distal metastasis on CT. Get a chest x-ray that insurance would not pay for a CT of his chest which not show any metastatic disease. He presents today for right robotic nephroureterectomy.  After reviewing the management options for treatment, the patient elected to proceed with the above surgical procedure(s). We have discussed the potential benefits and risks of the procedure, side effects of the proposed treatment, the likelihood of the patient achieving the goals of the procedure, and any potential  problems that might occur during the procedure or recuperation. Informed consent has been obtained.  Description of procedure: The patient was met in the preoperative area. All risks, benefits, and indications of the procedure were described in great detail. The patient consented to the procedure. Preoperative antibiotics were given. The patient was taken to the operative theater. General anesthesia was induced per the anesthesia service. A 20 French three-way Foley catheter was placed per urethra atraumatically. The patient was then placed in the right flank up (left dorsal lithotomy) position. He was placed over the table break in the table was flexed. All pressure points were appropriately padded. He was prepped and draped in the usual sterile fashion. A preoperative timeout was called. The abdomen was insufflated with a Veress needle after negative drop test to 15 mmH20. 12 mm port was then placed after making a 12 mm skin incision just superior to the umbilicus under direct visualization. Visualization of the abdomen showed no damage from the Veress needle or trocar placement. Another 12 mm port was placed cephalad to that. The right lateral position of the midline 12 mm ports the third 20 port was then placed and visualization. Three 8 mm robotic ports were then also placed under direct visualization for a total of 6 laparoscopic port sites. The robot was then docked. This point the right colon was then mobilized along the white line of [place] until the colon reflected off the kidney. The liver was retracted with a grasper fixed to the diaphragm when needed. Once the colon was fully mobilized attention was then turned to locating the gonadal vein and right ureter. Once these 2 structures were isolated they were followed proximally to localize the renal pelvis and hilum. At this point,  it became clear that his hilum may be involved in his disease. There was severe desmoplastic changes surrounding the  anticipated location of the hilum. Eventually the known right lower pole artery was isolated and ligated with a vascular staple load. Further dissection then was very difficult. For approximately 3 hours, attention was slowly focused on trying to locate the renal artery and vein which from CT imaging we knew there were multiple of each. We did note various hard nodular structures concerning for invasion of the tumor into the renal artery and vein and possibly into the IVC. We felt that these structures were circumferentially surrounded in likely metastatic tissue. This point, frozen sections of the hilar fat and lymph nodes were sent. These came back positive for carcinoma. We're concerned this point that any further manipulation of his vessels could cause uncontrollable bleeding and death. He also now has newly confirmed metastatic disease so removing his kidney would offer little to no benefit with very high risk for increased morbidity and mortality. Given these circumstances, it was decided that the safest option was to abort the procedure well and was still possible. Hemostasis was obtained. Another section was obtained of the hilar fat and lymph nodes for permanent section. Surgicel and Surgifoam were placed along the dissection bed particularly around the right renal hilum. Hemostasis was obtained and was excellent. This was confirmed even with reduced insufflation pressures. The robot was then undocked. The 36 mm ports were closed with a 0 Vicryl. The [employer] under direct visualization. The ports were then all removed under direct visualization. The 0 Vicryl sutures were then tied. His skin was anesthetized 1% lidocaine at the incision sites. Skin the patient was awoken from anesthesia and transferred in stable condition to the post anesthesia care unit.  Dr. Erlene Quan and Dr. Pilar Jarvis were both necessary to this procedure as it required one surgeon to be bedside during the procedure for bedside  assistance and another to perform the robotic portion of the case on the robotic console.  Plan: The patient will be admitted to the floor overnight. We'll plan to remove his Foley catheter, and resume his baseline CIC in the morning. We'll recheck his lab levels, in particular his hemoglobin and his creatinine, in the morning. Assuming his creatinine remains at baseline, we will get restaging imaging with CT abdomen, pelvis, and chest. He will need an outpatient appointment with medical oncology.   Baruch Gouty, M.D.

## 2017-05-02 NOTE — Anesthesia Preprocedure Evaluation (Addendum)
Anesthesia Evaluation  Patient identified by MRN, date of birth, ID band Patient awake    Reviewed: Allergy & Precautions, NPO status , Patient's Chart, lab work & pertinent test results, reviewed documented beta blocker date and time   History of Anesthesia Complications (+) PONV  Airway Mallampati: II  TM Distance: >3 FB     Dental  (+) Chipped, Poor Dentition, Dental Advisory Given, Missing   Pulmonary COPD, former smoker,           Cardiovascular hypertension, Pt. on medications + CAD, + CABG and +CHF       Neuro/Psych PSYCHIATRIC DISORDERS Anxiety Depression  Neuromuscular disease    GI/Hepatic PUD,   Endo/Other  Hypothyroidism   Renal/GU      Musculoskeletal  (+) Arthritis ,   Abdominal   Peds  Hematology   Anesthesia Other Findings   Reproductive/Obstetrics                            Anesthesia Physical Anesthesia Plan  ASA: III  Anesthesia Plan: General   Post-op Pain Management:    Induction: Intravenous  PONV Risk Score and Plan:   Airway Management Planned: Oral ETT  Additional Equipment:   Intra-op Plan:   Post-operative Plan:   Informed Consent: I have reviewed the patients History and Physical, chart, labs and discussed the procedure including the risks, benefits and alternatives for the proposed anesthesia with the patient or authorized representative who has indicated his/her understanding and acceptance.     Plan Discussed with: CRNA  Anesthesia Plan Comments:         Anesthesia Quick Evaluation

## 2017-05-02 NOTE — Anesthesia Postprocedure Evaluation (Signed)
Anesthesia Post Note  Patient: Silvestre Mines Pry  Procedure(s) Performed: Procedure(s) (LRB): ROBOT ASSITED LAPAROSCOPIC NEPHROURETERECTOMY ATTEMPTED (Right)  Patient location during evaluation: PACU Anesthesia Type: General Level of consciousness: awake and alert Pain management: pain level controlled Vital Signs Assessment: post-procedure vital signs reviewed and stable Respiratory status: spontaneous breathing, nonlabored ventilation, respiratory function stable and patient connected to nasal cannula oxygen Cardiovascular status: blood pressure returned to baseline and stable Postop Assessment: no signs of nausea or vomiting Anesthetic complications: no     Last Vitals:  Vitals:   05/02/17 1348 05/02/17 1355  BP:    Pulse: 85 85  Resp: 13 15  Temp:      Last Pain:  Vitals:   05/02/17 1355  TempSrc:   PainSc: [place]

## 2017-05-02 NOTE — Anesthesia Procedure Notes (Signed)
Procedure Name: Intubation Performed by: Rolla Plate Pre-anesthesia Checklist: Patient identified, Patient being monitored, Timeout performed, Emergency Drugs available and Suction available Patient Re-evaluated:Patient Re-evaluated prior to induction Oxygen Delivery Method: Circle system utilized Preoxygenation: Pre-oxygenation with 100% oxygen Induction Type: IV induction Ventilation: Mask ventilation without difficulty and Oral airway inserted - appropriate to patient size Laryngoscope Size: Mac and 3 Grade View: Grade I Tube type: Oral Tube size: 7.5 mm Number of attempts: 1 Airway Equipment and Method: Stylet Placement Confirmation: ETT inserted through vocal cords under direct vision,  positive ETCO2 and breath sounds checked- equal and bilateral Secured at: 21 cm Tube secured with: Tape Dental Injury: Teeth and Oropharynx as per pre-operative assessment

## 2017-05-02 NOTE — H&P (Addendum)
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 UDS at [facility] patternt with small contractions but ineficient emptying, PVR's >477ml. Manages with self cath about QID. Tried ofice BTX 100 IH and oral anticholinergics w/o help for urgency component which is minimal bother. No incontinence.  Recent Surveaillance: 2016 - [place] no hydro, Cr <1.0.  2017 - CT ? Rt renal mass, Cr. <1.5 2018 - CT Enlarging Rt renal mass, Cr 1.19  2 - Gross Hematuria- gross hematuria 08/2016. CT with ? Endophytic right renal mass v. Pyelo, Cysto 08/2016 unremakrable.  3 - Right renal pelvis high grade TCC- endophytic hypodense mass by CT 08/2016 on eval hematuria, felt likely resolving pyelo. Repeat imaging 01/2017 much more concerning with enlarging endophytic mass with right upper pole hydro and peri-renal neovascularity. No adenopathy. 3 artery / 2 vein right renovascular anatomy. 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  His renal pelvis was completely full of tumor and only the distal portion of the renal pelvis was able to be visualized. The tumor blocked access to the proximal renal pelvis as well as the middle, upper, lower poles. Pathology on biopsy was high grade upper tract TCC.  He does have history of right inguinal herniorrhaphy. He does have a noticeable left inguinal hernia at this time as well as a small umbilical hernia. He has no other abdominal surgeries.  PMH:     Past Medical History:  Diagnosis Date  . Acid indigestion 03/09/2014  . Adult hypothyroidism 11/18/2012  . Anxiety   . Arteriosclerosis of coronary artery 11/18/2012   Overview:  PATENT  LIMA TO LAD, PATENT SVG TO PDA AND OCCULUDED SVG TO OM2 BY CATHERIZATION 02/09/2009   . 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 Borderline diabetes 05/25/2014  . BP (high blood pressure) 11/18/2012  . BPH (benign prostatic hypertrophy)   . CAFL (chronic airflow limitation) ([place]) 05/03/2015  . CCF (congestive cardiac failure) ([place]) 01/03/2014   Overview:  HX OF   . Clinical depression 05/03/2015  . COPD (chronic obstructive pulmonary disease) ([place])   . DDD (degenerative disc disease), lumbar 06/29/2014  . 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 Diabetes mellitus without complication (HCC)    diet controlled   . Gastric ulcer 03/09/2014  . 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 History of urinary self-catheterization 2017  . HLD (hyperlipidemia) 01/03/2014  . Incomplete bladder emptying 11/18/2012  . Leg weakness 11/18/2012  . Lumbar radiculitis   . Neuritis or radiculitis due to rupture of lumbar intervertebral disc 06/29/2014  . Overactive bladder   . 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 Teeth problem    pt reports "bad teeth", "need to be pulled"    Surgical History:      Past Surgical History:  Procedure Laterality Date  . ARTERY BIOPSY Right 12/16/2015   Procedure: BIOPSY TEMPORAL ARTERY;  Surgeon: Algernon Huxley, MD;  Location: [facility];  Service: Vascular;  Laterality: Right;  . CARDIAC CATHETERIZATION    . CATARACT EXTRACTION W/PHACO Left 01/26/2016   Procedure: CATARACT EXTRACTION PHACO AND INTRAOCULAR LENS  PLACEMENT (IOC) LEFT EYE;  Surgeon: Leandrew Koyanagi, MD;  Location: [place];  Service: Ophthalmology;  Laterality: Left;  . CORONARY ARTERY BYPASS GRAFT  04/07/2002   [facility]  . Cysto Bladder Botox Injection  07/02/2014  . CYSTOSCOPY W/ RETROGRADES Right 03/16/2017   Procedure: CYSTOSCOPY WITH RETROGRADE  PYELOGRAM;  Surgeon: [name], MD;  Location: [facility];  Service: Urology;  Laterality: Right;  . CYSTOSCOPY WITH STENT PLACEMENT Right 03/16/2017   Procedure: CYSTOSCOPY WITH STENT PLACEMENT;  Surgeon: [name], MD;  Location: [facility];  Service: Urology;  Laterality: Right;  . EYE SURGERY Left    blood clot behind left eye  . HAND SURGERY Bilateral   . HERNIA REPAIR Right    inguinal hernia repair  . URETEROSCOPY Right 03/16/2017   Procedure: URETEROSCOPY BIOPSY RENAL MASS;  Surgeon: [name], MD;  Location: [facility];  Service: Urology;  Laterality: Right;    Home Medications:  Allergies as of 03/23/2017   No Known Allergies              Medication List           Accurate as of 03/23/17  1:44 PM. Always use your most recent med list.           acetaminophen 500 MG tablet Commonly known as:  TYLENOL Take 500 mg by mouth 2 (two) times daily as needed (for pain.).   aspirin EC 81 MG tablet Take 81 mg by mouth daily after breakfast.   aspirin 325 MG tablet Take 325 mg by mouth daily as needed (CHEW & SWALLOW ONE TABLET IF NEEDED FOR CHEST PAIN (REPLACES NITROGLYCERIN)).   buPROPion 100 MG 12 hr tablet Commonly known as:  WELLBUTRIN SR Take 100 mg by mouth daily after breakfast.   cyclobenzaprine 10 MG tablet Commonly known as:  FLEXERIL Take 10 mg by mouth at bedtime as needed for muscle spasms.   ezetimibe-simvastatin 10-40 MG tablet Commonly known as:  VYTORIN Take 1 tablet by mouth at bedtime.   HYDROcodone-acetaminophen 5-325 MG tablet Commonly known as:  NORCO Take 1-2 tablets by mouth every 4 (four) hours as needed for moderate pain.   hyoscyamine 0.125 MG Tbdp disintergrating tablet Commonly known as:  ANASPAZ Place 0.125 mg under the tongue every 6 (six) hours as needed (for abdominal cramping due to IBS).   ketoconazole 2 % shampoo Commonly known as:  NIZORAL APPLY SHAMPOO TO HAIR AS NEEDED FOR  FLAKY/ITCHY SCALP   levothyroxine 100 MCG tablet Commonly known as:  SYNTHROID, LEVOTHROID Take 100 mcg by mouth daily before breakfast.   multivitamin with minerals Tabs tablet Take 1 tablet by mouth daily after breakfast. CENTRUM SILVER   PEPPERMINT OIL PO Take 1 capsule by mouth 3 (three) times daily as needed (for IBS). IBgard (active ingredient: 90 mg ultrapurified peppermint oil)   polyethylene glycol powder powder Commonly known as:  GLYCOLAX/MIRALAX Take 17 g by mouth daily after breakfast. Mix with glass of water   PROBIOTIC PO Take 1 capsule by mouth daily as needed (for digestive health (see instructions)). AFTER BREAKFAST EITHER TAKE A PROBIOTIC OR EAT YOGURT   RABEprazole 20 MG tablet Commonly known as:  ACIPHEX Take 20 mg by mouth daily before breakfast.   ramipril 10 MG capsule Commonly known as:  ALTACE Take 10 mg by mouth daily after breakfast.   sertraline 100 MG tablet Commonly known as:  ZOLOFT Take 100 mg by mouth daily after breakfast.   tamsulosin 0.4  MG Caps capsule Commonly known as:  FLOMAX Take 0.8 mg by mouth daily after supper.   traMADol 50 MG tablet Commonly known as:  ULTRAM Take 50 mg by mouth 2 (two) times daily as needed (for pain.).       Allergies: No Known Allergies  Family History:      Family History  Problem Relation Age of Onset  . Heart attack Mother   . Heart disease Father   . Prostate cancer Neg Hx   . Bladder Cancer Neg Hx   . Kidney cancer Neg Hx     Social History:  reports that he quit smoking about 14 years ago. His smoking use included Cigarettes. He has never used smokeless tobacco. He reports that he does not drink alcohol or use drugs.  ROS: 12 point ROS negative except for above  Physical Exam: BP (!) 174/65 (BP Location: Left Arm, Patient Position: Sitting, Cuff Size: Normal)   Pulse 89   Ht 5\' 7"  (1.702 m)   Wt 167 lb (75.8 kg)   BMI 26.16 kg/m   Constitutional:  Alert  and oriented, No acute distress. HEENT: [place] AT, moist mucus membranes.  Trachea midline, no masses. Cardiovascular: No clubbing, cyanosis, or edema. RRR Respiratory: Normal respiratory effort, no increased work of breathing. Lungs clear GI: Abdomen is soft, nontender, nondistended, no abdominal masses GU: No CVA tenderness.  Skin: No rashes, bruises or suspicious lesions. Lymph: No cervical or inguinal adenopathy. Neurologic: Grossly intact, no focal deficits, moving all 4 extremities. Psychiatric: Normal mood and affect.  Laboratory Data: RecentLabs       Lab Results  Component Value Date   WBC 8.7 03/05/2017   HGB 13.7 03/05/2017   HCT 41.8 03/05/2017   MCV 84.2 03/05/2017   PLT 182 03/05/2017      RecentLabs       Lab Results  Component Value Date   CREATININE 1.08 03/05/2017      RecentLabs  No results found for: PSA    RecentLabs  No results found for: TESTOSTERONE    RecentLabs  No results found for: HGBA1C    Urinalysis Labs(Brief)          Component Value Date/Time   APPEARANCEUR Clear 08/29/2016 1130   GLUCOSEU Negative 08/29/2016 1130   BILIRUBINUR Negative 08/29/2016 1130   PROTEINUR Negative 08/29/2016 1130   NITRITE Negative 08/29/2016 1130   LEUKOCYTESUR Negative 08/29/2016 1130         Assessment & Plan:    1 - 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  2 - Gross Hematuria- plan as per below  3 - High grade TCC of Right Renal Pelvis- Plan for right robotic nephroureterectomy. Again reviewed risks and benefits with the patient.

## 2017-05-02 NOTE — Anesthesia Post-op Follow-up Note (Signed)
Anesthesia QCDR form completed.        

## 2017-05-02 NOTE — Transfer of Care (Signed)
Immediate Anesthesia Transfer of Care Note  Patient: Joseph Ritter  Procedure(s) Performed: Procedure(s): ROBOT ASSITED LAPAROSCOPIC NEPHROURETERECTOMY ATTEMPTED (Right)  Patient Location: PACU  Anesthesia Type:General  Level of Consciousness: awake  Airway & Oxygen Therapy: Patient Spontanous Breathing and Patient connected to face mask oxygen  Post-op Assessment: Report given to RN and Post -op Vital signs reviewed and stable  Post vital signs: Reviewed  Last Vitals:  Vitals:   05/02/17 1259 05/02/17 1301  BP: (P) 134/66 134/66  Pulse: (P) 83   Resp: (P) 15 15  Temp: (!) (P) 36.2 C (!) 36.2 C    Last Pain:  Vitals:   05/02/17 0553  TempSrc: Tympanic  PainSc: 4          Complications: No apparent anesthesia complications

## 2017-05-03 ENCOUNTER — Inpatient Hospital Stay: Payer: Medicare Other

## 2017-05-03 ENCOUNTER — Other Ambulatory Visit: Payer: Self-pay | Admitting: Urology

## 2017-05-03 DIAGNOSIS — C641 Malignant neoplasm of right kidney, except renal pelvis: Secondary | ICD-10-CM

## 2017-05-03 LAB — CBC
HEMATOCRIT: 38 % — AB (ref 40.0–52.0)
HEMOGLOBIN: 12.5 g/dL — AB (ref 13.0–18.0)
MCH: 27.6 pg (ref 26.0–34.0)
MCHC: 32.9 g/dL (ref 32.0–36.0)
MCV: 83.9 fL (ref 80.0–100.0)
Platelets: 168 10*3/uL (ref 150–440)
RBC: 4.53 MIL/uL (ref 4.40–5.90)
RDW: 15.1 % — ABNORMAL HIGH (ref 11.5–14.5)
WBC: 10 10*3/uL (ref 3.8–10.6)

## 2017-05-03 LAB — BASIC METABOLIC PANEL
ANION GAP: 9 (ref 5–15)
BUN: 19 mg/dL (ref 6–20)
CHLORIDE: 103 mmol/L (ref 101–111)
CO2: 26 mmol/L (ref 22–32)
Calcium: 8.8 mg/dL — ABNORMAL LOW (ref 8.9–10.3)
Creatinine, Ser: 1.3 mg/dL — ABNORMAL HIGH (ref 0.61–1.24)
GFR calc non Af Amer: 52 mL/min — ABNORMAL LOW (ref >60)
GLUCOSE: 115 mg/dL — AB (ref 65–99)
Potassium: 4.4 mmol/L (ref 3.5–5.1)
Sodium: 138 mmol/L (ref 135–145)

## 2017-05-03 MED ORDER — ZOLPIDEM TARTRATE 5 MG PO TABS
5.0000 mg | ORAL_TABLET | Freq: Every evening | ORAL | Status: DC | PRN
  Administered 2017-05-04: 5 mg via ORAL
  Filled 2017-05-03 (×2): qty 1

## 2017-05-03 MED ORDER — HYDROCODONE-ACETAMINOPHEN 5-325 MG PO TABS: 1.0000 | ORAL_TABLET | ORAL | 0 refills | Status: DC | PRN

## 2017-05-03 MED ORDER — ACETAMINOPHEN 10 MG/ML IV SOLN
1000.0000 mg | Freq: Once | INTRAVENOUS | Status: AC
  Administered 2017-05-03: 1000 mg via INTRAVENOUS
  Filled 2017-05-03: qty 100

## 2017-05-03 MED ORDER — IOPAMIDOL (ISOVUE-300) INJECTION 61%
100.0000 mL | Freq: Once | INTRAVENOUS | Status: AC | PRN
  Administered 2017-05-03: 100 mL via INTRAVENOUS

## 2017-05-03 NOTE — Progress Notes (Signed)
Reviewed CT findings with patient/family. Patient still with low PO intake. Will plan to keep overnight. Likely d/c in AM.

## 2017-05-03 NOTE — Progress Notes (Signed)
Incisional pain overnight Mild N. No V No bm/flatus Tolerating clears Hg/Cr stable  Vitals:   05/02/17 2045 05/03/17 0220 05/03/17 0501 05/03/17 0745  BP: (!) 166/86 (!) 179/78 (!) 163/115 (!) 174/66  Pulse: 86 86 86 87  Resp: (!) 21 18 20 20   Temp: 98.4 F (36.9 C) 98.3 F (36.8 C) 98.4 F (36.9 C) 98.1 F (36.7 C)  TempSrc: Oral Oral Oral Oral  SpO2: 97% 98% 95% 96%  Weight:      Height:       I/O last 3 completed shifts: In: 2443.8 [P.O.:60; I.V.:2288.8; IV Piggyback:95] Out: 3825 [Urine:1250; Blood:100] No intake/output data recorded.   NAD Soft approp T, mild D. Mainly peri-incisional tenderness Foley clear yellow  CBC    Component Value Date/Time   WBC 10.0 05/03/2017 0402   RBC 4.53 05/03/2017 0402   HGB 12.5 (L) 05/03/2017 0402   HGB 15.3 11/15/2012 1641   HCT 38.0 (L) 05/03/2017 0402   HCT 43.7 11/15/2012 1641   PLT 168 05/03/2017 0402   PLT 166 11/15/2012 1641   MCV 83.9 05/03/2017 0402   MCV 89 11/15/2012 1641   MCH 27.6 05/03/2017 0402   MCHC 32.9 05/03/2017 0402   RDW 15.1 (H) 05/03/2017 0402   RDW 13.3 11/15/2012 1641   LYMPHSABS 1.8 12/14/2015 1222   LYMPHSABS 1.7 11/15/2012 1641   MONOABS 0.7 12/14/2015 1222   MONOABS 0.8 11/15/2012 1641   EOSABS 0.2 12/14/2015 1222   EOSABS 0.1 11/15/2012 1641   BASOSABS 0.0 12/14/2015 1222   BASOSABS 0.0 11/15/2012 1641   BMP Latest Ref Rng & Units 05/03/2017 04/19/2017 03/05/2017  Glucose 65 - 99 mg/dL 115(H) 104(H) 112(H)  BUN 6 - 20 mg/dL 19 17 20   Creatinine 0.61 - 1.24 mg/dL 1.30(H) 1.32(H) 1.08  BUN/Creat Ratio 10 - 24 - - -  Sodium 135 - 145 mmol/L 138 139 138  Potassium 3.5 - 5.1 mmol/L 4.4 4.5 4.7  Chloride 101 - 111 mmol/L 103 100(L) 101  CO2 22 - 32 mmol/L 26 29 29   Calcium 8.9 - 10.3 mg/dL 8.8(L) 9.7 9.6   POD 1 aborted right robotic NephroU 2/2 unresectable newly metastatic disease diagnosed intraop. Progressing as expected -adv to fulls -IV tylenol x1 dose -encourage PO pain  control -OOB/ambulate/IS -Repeat CT staging due to intraop findings -d/c foley. Resume home CIC -will need medical oncology follow up -hopefully home later today if pain is better controlled

## 2017-05-04 LAB — BASIC METABOLIC PANEL
Anion gap: 8 (ref 5–15)
BUN: 15 mg/dL (ref 6–20)
CALCIUM: 8.5 mg/dL — AB (ref 8.9–10.3)
CHLORIDE: 101 mmol/L (ref 101–111)
CO2: 25 mmol/L (ref 22–32)
CREATININE: 1.2 mg/dL (ref 0.61–1.24)
GFR, EST NON AFRICAN AMERICAN: 58 mL/min — AB (ref >60)
Glucose, Bld: 128 mg/dL — ABNORMAL HIGH (ref 65–99)
Potassium: 3.9 mmol/L (ref 3.5–5.1)
SODIUM: 134 mmol/L — AB (ref 135–145)

## 2017-05-04 LAB — CBC
HCT: 34.7 % — ABNORMAL LOW (ref 40.0–52.0)
HEMOGLOBIN: 11.5 g/dL — AB (ref 13.0–18.0)
MCH: 27.7 pg (ref 26.0–34.0)
MCHC: 33.2 g/dL (ref 32.0–36.0)
MCV: 83.2 fL (ref 80.0–100.0)
PLATELETS: 147 10*3/uL — AB (ref 150–440)
RBC: 4.17 MIL/uL — AB (ref 4.40–5.90)
RDW: 15.2 % — ABNORMAL HIGH (ref 11.5–14.5)
WBC: 12.5 10*3/uL — ABNORMAL HIGH (ref 3.8–10.6)

## 2017-05-04 NOTE — Discharge Instructions (Signed)
Incision Care, Adult An incision is a surgical cut that is made through your skin. Most incisions are closed after surgery. Your incision may be closed with stitches (sutures), staples, skin glue, or adhesive strips. You may need to return to your health care provider to have sutures or staples removed. This may occur several days to several weeks after your surgery. The incision needs to be cared for properly to prevent infection. How to care for your incision Incision care   Follow instructions from your health care provider about how to take care of your incision. Make sure you: ? Wash your hands with soap and water before you change the bandage (dressing). If soap and water are not available, use hand sanitizer. ? Change your dressing as told by your health care provider. ? Leave sutures, skin glue, or adhesive strips in place. These skin closures may need to stay in place for 2 weeks or longer. If adhesive strip edges start to loosen and curl up, you may trim the loose edges. Do not remove adhesive strips completely unless your health care provider tells you to do that.  Check your incision area every day for signs of infection. Check for: ? More redness, swelling, or pain. ? More fluid or blood. ? Warmth. ? Pus or a bad smell.  Ask your health care provider how to clean the incision. This may include: ? Using mild soap and water. ? Using a clean towel to pat the incision dry after cleaning it. ? Applying a cream or ointment. Do this only as told by your health care provider. ? Covering the incision with a clean dressing.  Ask your health care provider when you can leave the incision uncovered.  Do not take baths, swim, or use a hot tub until your health care provider approves. Ask your health care provider if you can take showers. You may only be allowed to take sponge baths for bathing. Medicines  If you were prescribed an antibiotic medicine, cream, or ointment, take or apply the  antibiotic as told by your health care provider. Do not stop taking or applying the antibiotic even if your condition improves.  Take over-the-counter and prescription medicines only as told by your health care provider. General instructions  Limit movement around your incision to improve healing. ? Avoid straining, lifting, or exercise for the first month, or for as long as told by your health care provider. ? Follow instructions from your health care provider about returning to your normal activities. ? Ask your health care provider what activities are safe.  Protect your incision from the sun when you are outside for the first 6 months, or for as long as told by your health care provider. Apply sunscreen around the scar or cover it up.  Keep all follow-up visits as told by your health care provider. This is important. Contact a health care provider if:  Your have more redness, swelling, or pain around the incision.  You have more fluid or blood coming from the incision.  Your incision feels warm to the touch.  You have pus or a bad smell coming from the incision.  You have a fever or shaking chills.  You are nauseous or you vomit.  You are dizzy.  Your sutures or staples come undone. Get help right away if:  You have a red streak coming from your incision.  Your incision bleeds through the dressing and the bleeding does not stop with gentle pressure.  The edges of   your incision open up and separate.  You have severe pain.  You have a rash.  You are confused.  You faint.  You have trouble breathing and a fast heartbeat. This information is not intended to replace advice given to you by your health care provider. Make sure you discuss any questions you have with your health care provider. Document Released: 03/31/2005 Document Revised: 05/19/2016 Document Reviewed: 03/29/2016 Elsevier Interactive Patient Education  2018 [employer]  

## 2017-05-04 NOTE — Progress Notes (Signed)
Patient discharged to home as ordered. Dressings to abdomen clean dry and intact. IV's discontinued sites clean and and intact. Patient is alert and oriented ambulates well with stand by assistance, Wife at the bedside and will take patient home. Discharge instructions, prescription and follow up appointments given as ordered. Patient denies pain at this time, no acute distress noted.

## 2017-05-04 NOTE — Discharge Summary (Signed)
Physician Discharge Summary  Patient ID: Joseph Ritter MRN: [id number] DOB/AGE: Jan 13, 1941 76 y.o.  Admit date: 05/02/2017 Discharge date: 05/04/2017  Admission Diagnoses: Right upper tract urothelial carcinoma  Discharge Diagnoses:  Active Problems:   Transitional cell carcinoma of kidney, right [facility])   Discharged Condition: good  Hospital Course: Patient was admitted following aborted right robotic nephroureterectomy secondary to unresectable newly metastatic disease diagnosed intraoperatively. Postop he underwent a CT scan of the abdomen and pelvis which showed extension of an infiltrative mass from the right kidney along the right renal vein toward the IVC with encasement of the IVC. By postoperative day #2 the patient is ambulating, passing flatus, tolerating a regular diet and had adequate pain control. His vital signs and kidney function were stable. He was discharged home.  Consults: None  Significant Diagnostic Studies: CT scan A/P   Treatments: surgery: Aborted right robotic nephroureterectomy  Discharge Exam: Blood pressure (!) 172/84, pulse 84, temperature 97.8 F (36.6 C), temperature source Oral, resp. rate (!) 24, height 5\' 7"  (1.702 m), weight 80.8 kg (178 lb 1.6 oz), SpO2 95 %. Patient is in no acute distress. He was sitting in the chair and then got up and walked into the bathroom. While in the chair he had a regular rate and rhythm on cardiac evaluation. He was breathing with regular effort in depth. Abdomen-soft, nontender. Incisions were clean dry and intact. Lower extremities-calves were nontender and without swelling   Disposition: 01-Home or Self Care  Discharge Instructions    Discharge patient    Complete by:  As directed    Discharge disposition:  01-Home or Self Care   Discharge patient date:  05/04/2017     Allergies as of 05/04/2017   No Known Allergies     Medication List    TAKE these medications   acetaminophen 500 MG tablet Commonly  known as:  TYLENOL Take 500 mg by mouth 2 (two) times daily as needed (for pain.).   aspirin EC 81 MG tablet Take 81 mg by mouth daily after breakfast.   aspirin 325 MG tablet Take 325 mg by mouth daily as needed (CHEW & SWALLOW ONE TABLET IF NEEDED FOR CHEST PAIN (REPLACES NITROGLYCERIN)).   buPROPion 100 MG 12 hr tablet Commonly known as:  WELLBUTRIN SR Take 100 mg by mouth daily after breakfast.   CITRUCEL PO Take 1 tablet by mouth daily after breakfast.   cyclobenzaprine 10 MG tablet Commonly known as:  FLEXERIL Take 10 mg by mouth at bedtime as needed for muscle spasms.   ezetimibe-simvastatin 10-40 MG tablet Commonly known as:  VYTORIN Take 1 tablet by mouth at bedtime.   HYDROcodone-acetaminophen 5-325 MG tablet Commonly known as:  NORCO Take 1-2 tablets by mouth every 4 (four) hours as needed for moderate pain.   hyoscyamine 0.125 MG Tbdp disintergrating tablet Commonly known as:  ANASPAZ Place 0.125 mg under the tongue every 6 (six) hours as needed (for abdominal cramping due to IBS).   ketoconazole 2 % shampoo Commonly known as:  NIZORAL APPLY SHAMPOO TO HAIR AS NEEDED FOR FLAKY/ITCHY SCALP (TYPICALLY EVERY 2-3 DAYS)   levothyroxine 100 MCG tablet Commonly known as:  SYNTHROID, LEVOTHROID Take 100 mcg by mouth daily before breakfast.   multivitamin with minerals Tabs tablet Take 1 tablet by mouth daily after breakfast. CENTRUM SILVER   PEPPERMINT OIL PO Take 1 capsule by mouth 3 (three) times daily as needed (for IBS). IBgard (active ingredient: 90 mg ultrapurified peppermint oil)   polyethylene glycol  powder powder Commonly known as:  GLYCOLAX/MIRALAX Take 17 g by mouth daily after breakfast. Mix with glass of water   PROBIOTIC PO Take 1 capsule by mouth daily as needed (for digestive health (see instructions)). AFTER BREAKFAST EITHER TAKE A PROBIOTIC OR EAT YOGURT   RABEprazole 20 MG tablet Commonly known as:  ACIPHEX Take 20 mg by mouth daily before  breakfast.   ramipril 10 MG capsule Commonly known as:  ALTACE Take 10 mg by mouth daily after breakfast.   sertraline 100 MG tablet Commonly known as:  ZOLOFT Take 100 mg by mouth daily after breakfast.   tamsulosin 0.4 MG Caps capsule Commonly known as:  FLOMAX Take 0.8 mg by mouth daily after supper.   traMADol 50 MG tablet Commonly known as:  ULTRAM Take 50 mg by mouth 2 (two) times daily as needed (for pain.).      Follow-up Information    [name], MD Follow up on 05/16/2017.   Specialty:  Urology Why:  As scheduled - 05/16/2017, 10:15 AM  Contact information: [place] [address] [phone]        [facility] Follow up on 05/08/2017.   Specialty:  Oncology Why:  11 AM  Contact information: [address] [place], [place] [id number] [place] [phone]          Signed: Festus Aloe 05/04/2017, 2:15 PM

## 2017-05-07 ENCOUNTER — Telehealth: Payer: Self-pay | Admitting: Radiology

## 2017-05-07 ENCOUNTER — Encounter: Payer: Self-pay | Admitting: Urology

## 2017-05-07 NOTE — Telephone Encounter (Signed)
Done

## 2017-05-07 NOTE — Telephone Encounter (Signed)
-----   Message from [name], MD sent at 05/03/2017  5:35 PM EDT ----- Can you cancel his post op cystogram? He no longer needs it. Thanks.

## 2017-05-08 ENCOUNTER — Inpatient Hospital Stay: Payer: Medicare Other | Attending: Oncology | Admitting: Oncology

## 2017-05-08 ENCOUNTER — Telehealth: Payer: Self-pay | Admitting: *Deleted

## 2017-05-08 ENCOUNTER — Encounter: Payer: Self-pay | Admitting: Oncology

## 2017-05-08 VITALS — BP 163/75 | HR 81 | Temp 97.0°F | Resp 20 | Wt 164.3 lb

## 2017-05-08 DIAGNOSIS — E039 Hypothyroidism, unspecified: Secondary | ICD-10-CM | POA: Insufficient documentation

## 2017-05-08 DIAGNOSIS — M5136 Other intervertebral disc degeneration, lumbar region: Secondary | ICD-10-CM | POA: Diagnosis not present

## 2017-05-08 DIAGNOSIS — M549 Dorsalgia, unspecified: Secondary | ICD-10-CM | POA: Insufficient documentation

## 2017-05-08 DIAGNOSIS — I2510 Atherosclerotic heart disease of native coronary artery without angina pectoris: Secondary | ICD-10-CM | POA: Insufficient documentation

## 2017-05-08 DIAGNOSIS — Z87891 Personal history of nicotine dependence: Secondary | ICD-10-CM | POA: Insufficient documentation

## 2017-05-08 DIAGNOSIS — R29898 Other symptoms and signs involving the musculoskeletal system: Secondary | ICD-10-CM

## 2017-05-08 DIAGNOSIS — I700 Atherosclerosis of aorta: Secondary | ICD-10-CM

## 2017-05-08 DIAGNOSIS — R5383 Other fatigue: Secondary | ICD-10-CM | POA: Insufficient documentation

## 2017-05-08 DIAGNOSIS — R140 Abdominal distension (gaseous): Secondary | ICD-10-CM | POA: Insufficient documentation

## 2017-05-08 DIAGNOSIS — N183 Chronic kidney disease, stage 3 unspecified: Secondary | ICD-10-CM | POA: Insufficient documentation

## 2017-05-08 DIAGNOSIS — K5900 Constipation, unspecified: Secondary | ICD-10-CM | POA: Diagnosis not present

## 2017-05-08 DIAGNOSIS — N400 Enlarged prostate without lower urinary tract symptoms: Secondary | ICD-10-CM

## 2017-05-08 DIAGNOSIS — C651 Malignant neoplasm of right renal pelvis: Secondary | ICD-10-CM | POA: Diagnosis present

## 2017-05-08 DIAGNOSIS — R531 Weakness: Secondary | ICD-10-CM | POA: Diagnosis not present

## 2017-05-08 DIAGNOSIS — I714 Abdominal aortic aneurysm, without rupture: Secondary | ICD-10-CM | POA: Insufficient documentation

## 2017-05-08 DIAGNOSIS — R262 Difficulty in walking, not elsewhere classified: Secondary | ICD-10-CM | POA: Diagnosis not present

## 2017-05-08 DIAGNOSIS — C7951 Secondary malignant neoplasm of bone: Secondary | ICD-10-CM | POA: Insufficient documentation

## 2017-05-08 DIAGNOSIS — N319 Neuromuscular dysfunction of bladder, unspecified: Secondary | ICD-10-CM | POA: Insufficient documentation

## 2017-05-08 DIAGNOSIS — C641 Malignant neoplasm of right kidney, except renal pelvis: Secondary | ICD-10-CM

## 2017-05-08 DIAGNOSIS — J449 Chronic obstructive pulmonary disease, unspecified: Secondary | ICD-10-CM | POA: Diagnosis not present

## 2017-05-08 DIAGNOSIS — Z79899 Other long term (current) drug therapy: Secondary | ICD-10-CM | POA: Insufficient documentation

## 2017-05-08 DIAGNOSIS — R7303 Prediabetes: Secondary | ICD-10-CM | POA: Insufficient documentation

## 2017-05-08 DIAGNOSIS — R112 Nausea with vomiting, unspecified: Secondary | ICD-10-CM | POA: Diagnosis not present

## 2017-05-08 DIAGNOSIS — Z7982 Long term (current) use of aspirin: Secondary | ICD-10-CM | POA: Insufficient documentation

## 2017-05-08 DIAGNOSIS — K4090 Unilateral inguinal hernia, without obstruction or gangrene, not specified as recurrent: Secondary | ICD-10-CM | POA: Insufficient documentation

## 2017-05-08 DIAGNOSIS — E785 Hyperlipidemia, unspecified: Secondary | ICD-10-CM | POA: Insufficient documentation

## 2017-05-08 DIAGNOSIS — F418 Other specified anxiety disorders: Secondary | ICD-10-CM | POA: Insufficient documentation

## 2017-05-08 DIAGNOSIS — N3281 Overactive bladder: Secondary | ICD-10-CM | POA: Diagnosis not present

## 2017-05-08 DIAGNOSIS — R630 Anorexia: Secondary | ICD-10-CM | POA: Insufficient documentation

## 2017-05-08 DIAGNOSIS — C679 Malignant neoplasm of bladder, unspecified: Secondary | ICD-10-CM

## 2017-05-08 MED ORDER — SENNA 8.6 MG PO TABS: 2.0000 | ORAL_TABLET | Freq: Every day | ORAL | 0 refills | Status: DC

## 2017-05-08 MED ORDER — POLYETHYLENE GLYCOL 3350 17 GM/SCOOP PO POWD: 17.0000 g | Freq: Every day | ORAL | 1 refills | Status: DC

## 2017-05-08 MED ORDER — HYDROCODONE-ACETAMINOPHEN 5-325 MG PO TABS: 1.0000 | ORAL_TABLET | ORAL | 0 refills | Status: DC | PRN

## 2017-05-08 MED ORDER — POLYETHYLENE GLYCOL 3350 17 GM/SCOOP PO POWD: 17.0000 g | Freq: Every day | ORAL | 1 refills | Status: AC

## 2017-05-08 NOTE — Telephone Encounter (Signed)
States his meds were sent to the wrong pharmacy and wants it corrected. I called [facility] and left msg to disregard prescription and resubmitted to [facility] informed

## 2017-05-08 NOTE — Progress Notes (Signed)
  Oncology Nurse Navigator Documentation Met with Mr. Revoir and his family during and after consult with Dr. Tasia Catchings. Introduced nurse navigator services and provided contact informration for any future needs. Navigator Location: [facility] (05/08/17 1300)   )Navigator Encounter Type: Initial MedOnc (05/08/17 1300)                     Patient Visit Type: MedOnc;Initial (05/08/17 1300) Treatment Phase: Pre-Tx/Tx Discussion (05/08/17 1300) Barriers/Navigation Needs: Education (05/08/17 1300)                Acuity: Level 2 (05/08/17 1300)   Acuity Level 2: Initial guidance, education and coordination as needed;Ongoing guidance and education throughout treatment as needed;Educational needs (05/08/17 1300)     Time Spent with Patient: 45 (05/08/17 1300)

## 2017-05-08 NOTE — Progress Notes (Signed)
[facility] Initial Visit:  Patient Care Team: [name], MD as PCP - General (Physician Assistant)  CHIEF COMPLAINTS/PURPOSE OF CONSULTATION: Locally advanced kidney urothelia cancer  HISTORY OF PRESENTING ILLNESS: Joseph Ritter 76 y.o. male with PMH listed as below  is referred to [place] for evaluation of locally advanced kidney urothelia cancer. Patient developed gross hematuria at the end of 2017. CT 08/2016 showed endophytic hypodense mass. Repeat image 01/2017 showed enlarged  endophytic hypodense mass right kidney. 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 Right robotic nephroureterectomy was attempted but aborted due to unresectable newly metastatic disease diagnosed intraoperatively. Postop he underwent a CT scan of the abdomen and pelvis which showed extension of an infiltrative mass from the right kidney along the right renal vein toward the IVC with encasement of the IVC.  Today patient is accompanied by his wife, sister and brother in law. 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 He self catheterize a few time a day.  He reports having back pain which limits her ability to walk. Also constipated and abdominal bloating. His appetite is fair. He takes Norco for abdominal and back pain.  He can walk with walker for very short distance, mostly limited by back pain and fatigue./weakness.   Review of Systems  Constitutional: Positive for appetite change and fatigue.  HENT:  Negative.   Eyes: Negative.   Respiratory: Negative.   Cardiovascular: Negative.   Gastrointestinal: Positive for abdominal distention and constipation.  Endocrine: Negative.   Genitourinary: Positive for difficulty urinating.   Musculoskeletal: Positive for back pain.  Skin: Negative.     MEDICAL HISTORY: Past Medical History:  Diagnosis Date  . Acid indigestion 03/09/2014  . Adult hypothyroidism  11/18/2012  . Anxiety   . Arteriosclerosis of coronary artery 11/18/2012   Overview:  PATENT LIMA TO LAD, PATENT SVG TO PDA AND OCCULUDED SVG TO OM2 BY CATHERIZATION 02/09/2009   . 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 Borderline diabetes 05/25/2014  . BP (high blood pressure) 11/18/2012  . BPH (benign prostatic hypertrophy)   . CAFL (chronic airflow limitation) ([place]) 05/03/2015  . CCF (congestive cardiac failure) ([place]) 01/03/2014   Overview:  HX OF   . Clinical depression 05/03/2015  . COPD (chronic obstructive pulmonary disease) ([place])   . DDD (degenerative disc disease), lumbar 06/29/2014  . 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 Gastric ulcer 03/09/2014  . 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 History of urinary self-catheterization 2017  . HLD (hyperlipidemia) 01/03/2014  . Incomplete bladder emptying 11/18/2012  . Leg weakness 11/18/2012  . Lumbar radiculitis   . Neuritis or radiculitis due to rupture of lumbar intervertebral disc 06/29/2014  . Overactive bladder   . PONV (postoperative nausea and vomiting)    years ago with Ether, no problem with Nausea or vomiting with the last few surgerys  . Pre-diabetes   . Subacute transverse myelitis ([place]) 11/21/2012  . Teeth problem    pt reports "bad teeth", "need to be pulled"    SURGICAL HISTORY: Past Surgical History:  Procedure Laterality Date  . ARTERY BIOPSY Right 12/16/2015   Procedure: BIOPSY TEMPORAL ARTERY;  Surgeon: Algernon Huxley, MD;  Location: [facility];  Service: Vascular;  Laterality: Right;  . CARDIAC CATHETERIZATION    .  CATARACT EXTRACTION W/PHACO Left 01/26/2016   Procedure: CATARACT EXTRACTION PHACO AND INTRAOCULAR LENS PLACEMENT (IOC) LEFT EYE;  Surgeon: Leandrew Koyanagi, MD;  Location: [facility];  Service: Ophthalmology;  Laterality: Left;  . CORONARY ARTERY BYPASS GRAFT   04/07/2002   [facility]  . Cysto Bladder Botox Injection  07/02/2014  . CYSTOSCOPY W/ RETROGRADES Right 03/16/2017   Procedure: CYSTOSCOPY WITH RETROGRADE PYELOGRAM;  Surgeon: [name], MD;  Location: [facility];  Service: Urology;  Laterality: Right;  . CYSTOSCOPY WITH STENT PLACEMENT Right 03/16/2017   Procedure: CYSTOSCOPY WITH STENT PLACEMENT;  Surgeon: [name], MD;  Location: [facility];  Service: Urology;  Laterality: Right;  . EYE SURGERY Left    blood clot behind left eye  . HAND SURGERY Bilateral   . HERNIA REPAIR Right    inguinal hernia repair  . LEFT HEART CATH AND CORS/GRAFTS ANGIOGRAPHY N/A 04/25/2017   Procedure: Left Heart Cath and Cors/Grafts Angiography;  Surgeon: Isaias Cowman, MD;  Location: [facility];  Service: Cardiovascular;  Laterality: N/A;  . ROBOT ASSITED LAPAROSCOPIC NEPHROURETERECTOMY Right 05/02/2017   Procedure: ROBOT ASSITED LAPAROSCOPIC NEPHROURETERECTOMY ATTEMPTED;  Surgeon: [name], MD;  Location: [facility];  Service: Urology;  Laterality: Right;  . URETEROSCOPY Right 03/16/2017   Procedure: URETEROSCOPY BIOPSY RENAL MASS;  Surgeon: [name], MD;  Location: [facility];  Service: Urology;  Laterality: Right;    SOCIAL HISTORY: Social History   Social History  . Marital status: Married    Spouse name: N/A  . Number of children: N/A  . Years of education: N/A   Occupational History  . Not on file.   Social History Main Topics  . Smoking status: Former Smoker    Types: Cigarettes    Quit date: 04/07/2002  . Smokeless tobacco: Former Systems developer    Types: Snuff, Chew  . Alcohol use No  . Drug use: No  . Sexual activity: Not on file   Other Topics Concern  . Not on file   Social History Narrative  . No narrative on file    FAMILY HISTORY Family History  Problem Relation Age of Onset  . Heart attack Mother   . Heart disease Father   . Prostate cancer Neg Hx   . Bladder Cancer Neg Hx   . Kidney cancer  Neg Hx     ALLERGIES:  has No Known Allergies.  MEDICATIONS:  Current Outpatient Prescriptions  Medication Sig Dispense Refill  . acetaminophen (TYLENOL) 500 MG tablet Take 500 mg by mouth 2 (two) times daily as needed (for pain.).     Marland Kitchen aspirin 325 MG tablet Take 325 mg by mouth daily as needed (CHEW & SWALLOW ONE TABLET IF NEEDED FOR CHEST PAIN (REPLACES NITROGLYCERIN)).    Marland Kitchen aspirin EC 81 MG tablet Take 81 mg by mouth daily after breakfast.     . buPROPion (WELLBUTRIN SR) 100 MG 12 hr tablet Take 100 mg by mouth daily after breakfast.    . cyclobenzaprine (FLEXERIL) 10 MG tablet Take 10 mg by mouth at bedtime as needed for muscle spasms.    Marland Kitchen ezetimibe-simvastatin (VYTORIN) 10-40 MG tablet Take 1 tablet by mouth at bedtime.    Marland Kitchen HYDROcodone-acetaminophen (NORCO) 5-325 MG tablet Take 1 tablet by mouth every 4 (four) hours as needed for moderate pain. 120 tablet 0  . hyoscyamine (ANASPAZ) 0.125 MG TBDP disintergrating tablet Place 0.125 mg under the tongue every 6 (six) hours as needed (for abdominal cramping due to  IBS).    . ketoconazole (NIZORAL) 2 % shampoo APPLY SHAMPOO TO HAIR AS NEEDED FOR FLAKY/ITCHY SCALP (TYPICALLY EVERY 2-3 DAYS)    . levothyroxine (SYNTHROID, LEVOTHROID) 100 MCG tablet Take 100 mcg by mouth daily before breakfast.    . Methylcellulose, Laxative, (CITRUCEL PO) Take 1 tablet by mouth daily after breakfast.    . Multiple Vitamin (MULTIVITAMIN WITH MINERALS) TABS tablet Take 1 tablet by mouth daily after breakfast. CENTRUM SILVER    . PEPPERMINT OIL PO Take 1 capsule by mouth 3 (three) times daily as needed (for IBS). IBgard (active ingredient: 90 mg ultrapurified peppermint oil)    . Probiotic Product (PROBIOTIC PO) Take 1 capsule by mouth daily as needed (for digestive health (see instructions)). AFTER BREAKFAST EITHER TAKE A PROBIOTIC OR EAT YOGURT    . RABEprazole (ACIPHEX) 20 MG tablet Take 20 mg by mouth daily before breakfast.     . ramipril (ALTACE) 10 MG  capsule Take 10 mg by mouth daily after breakfast.    . sertraline (ZOLOFT) 100 MG tablet Take 100 mg by mouth daily after breakfast.    . tamsulosin (FLOMAX) 0.4 MG CAPS capsule Take 0.8 mg by mouth daily after supper.     . traMADol (ULTRAM) 50 MG tablet Take 50 mg by mouth 2 (two) times daily as needed (for pain.).    Marland Kitchen polyethylene glycol powder (GLYCOLAX/MIRALAX) powder Take 17 g by mouth daily after breakfast. Mix with glass of water 255 g 1  . senna (SENOKOT) 8.6 MG TABS tablet Take 2 tablets (17.2 mg total) by mouth daily. 120 each 0   No current facility-administered medications for this visit.     PHYSICAL EXAMINATION:  ECOG PERFORMANCE STATUS: 2 - Symptomatic, <50% confined to bed   Vitals:   05/08/17 1117  BP: (!) 163/75  Pulse: 81  Resp: 20  Temp: (!) 97 F (36.1 C)    Filed Weights   05/08/17 1117  Weight: 164 lb 5 oz (74.5 kg)     Physical Exam GENERAL: No distress, well nourished. Sitting in wheelchair. SKIN:  No rashes or significant lesions  HEAD: Normocephalic, No masses, lesions, tenderness or abnormalities  EYES: Conjunctiva are pale,  non icteric ENT: External ears normal ,lips , buccal mucosa, and tongue normal and mucous membranes are moist  LYMPH: No palpable cervical and axillary lymphadenopathy  LUNGS: Clear to auscultation, no crackles or wheezes HEART: Regular rate & rhythm, no murmurs, no gallops, S1 normal and S2 normal  ABDOMEN: Abdomen is distended, with normal bowel sounds MUSCULOSKELETAL: lumbar spine tenderness. EXTREMITIES: No edema, no skin discoloration or tenderness NEURO: Alert & oriented, lower extremity strength 3/5 bilaterllay. Normal upper extremity strength.    LABORATORY DATA: I have personally reviewed the data as listed: CBC    Component Value Date/Time   WBC 12.5 (H) 05/04/2017 0454   RBC 4.17 (L) 05/04/2017 0454   HGB 11.5 (L) 05/04/2017 0454   HGB 15.3 11/15/2012 1641   HCT 34.7 (L) 05/04/2017 0454   HCT 43.7  11/15/2012 1641   PLT 147 (L) 05/04/2017 0454   PLT 166 11/15/2012 1641   MCV 83.2 05/04/2017 0454   MCV 89 11/15/2012 1641   MCH 27.7 05/04/2017 0454   MCHC 33.2 05/04/2017 0454   RDW 15.2 (H) 05/04/2017 0454   RDW 13.3 11/15/2012 1641   LYMPHSABS 1.8 12/14/2015 1222   LYMPHSABS 1.7 11/15/2012 1641   MONOABS 0.7 12/14/2015 1222   MONOABS 0.8 11/15/2012 1641   EOSABS 0.2  12/14/2015 1222   EOSABS 0.1 11/15/2012 1641   BASOSABS 0.0 12/14/2015 1222   BASOSABS 0.0 11/15/2012 1641   CMP Latest Ref Rng & Units 05/04/2017 05/03/2017 04/19/2017  Glucose 65 - 99 mg/dL 128(H) 115(H) 104(H)  BUN 6 - 20 mg/dL 15 19 17   Creatinine 0.61 - 1.24 mg/dL 1.20 1.30(H) 1.32(H)  Sodium 135 - 145 mmol/L 134(L) 138 139  Potassium 3.5 - 5.1 mmol/L 3.9 4.4 4.5  Chloride 101 - 111 mmol/L 101 103 100(L)  CO2 22 - 32 mmol/L 25 26 29   Calcium 8.9 - 10.3 mg/dL 8.5(L) 8.8(L) 9.7  Total Protein 6.5 - 8.1 g/dL - - 7.7  Total Bilirubin 0.3 - 1.2 mg/dL - - 0.6  Alkaline Phos 38 - 126 U/L - - 114  AST 15 - 41 U/L - - 21  ALT 17 - 63 U/L - - 18     RADIOGRAPHIC STUDIES: I have personally reviewed the radiological images as listed and agree with the findings in the report CT chest and abdomen 05/03/2017 IMPRESSION: 1. Pneumoperitoneum with a small amount of ascites, gas tracking along the thoracoabdominal wall into the scrotum, and a small amount of gas in the right perirenal space-these findings are likely related to the patient's attempted nephroureterectomy yesterday. Surveillance to ensure expected resolution of the pneumoperitoneum might be appropriate. 2. The right kidney lower pole infarct with lack of patency of the more inferior of the 3 right renal arteries. There is delayed nephrogram on the right with increase an infiltrative process in the right mid kidney and extending into the right renal hilar vascular structures, including the right renal vein and the adjacent IVC, compatible with tumor  thrombus. There is also soft tissue density surrounding the renal hilar vascular structures and extending into the retroperitoneum and periaortic region, some of which may be tumor and some of which may be hematoma. 3. Demineralization of the anterior inferior L2 vertebral body, concerning for tumor invasion. This is near the level of the retroaortic left renal vein. 4. No findings of metastatic disease to the chest. There are new trace bilateral pleural effusions with mild passive atelectasis. 5.  Aortic Atherosclerosis (ICD10-I70.0).  Coronary atherosclerosis. 6. Inflammatory stranding from the right perirenal space extends down into the pelvis and crosses the midline. There is presacral edema. 7. 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 Recommend followup by ultrasound in 3 years. This recommendation follows ACR consensus guidelines: White Paper of the ACR Incidental Findings Committee II on Vascular Findings. J Am Coll Radiol 2013; 44:818-563 8. Wall thickening along the left side of the urinary bladder may be incidental, but synchronous sessile transitional cell carcinoma is not readily excluded.  CT abdomen wo contrast 02/09/2017  Increased size of 4.6 cm ill-defined hypoenhancing mass in interpolar region of right kidney, with obstruction of upper pole collecting system. This does not have typical appearance for pyelonephritis or renal cell carcinoma, and raises suspicion for urothelial carcinoma. Consider ureteroscopy for further evaluation.  No evidence of metastatic disease.  Stable mildly enlarged prostate and mild diffuse bladder wall thickening.  Stable 3.0 cm infrarenal abdominal aortic aneurysm. Recommend followup by ultrasound in 3 years. This recommendation follows ACR consensus guidelines: White Paper of the ACR Incidental Findings Committee II on Vascular Findings. J Am Coll Radiol 2013; 10:789-794.  Colonic diverticulosis. No  radiographic evidence of diverticulitis.  Stable small fat-containing left inguinal hernia.   Pathology 05/02/2017   SPECIMEN SUBMITTED:  A. Hilar tissue, right kidney  B. Hilar  tissue, right kidney  DIAGNOSIS:  A and B. 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   Comment:  One fragment in part A contains an infiltrative pleomorphic neoplasm.  Immunohistochemistry (IHC) was performed for further characterization,  and the neoplastic cells are positive for cytokeratins (AE1/AE3/Cam5.2),  with diffuse strong staining. Scattered cells show weak to moderate  staining for GATA3, which supports the above diagnosis.   The tumor is infiltrating adipose tissue, and no nodal tissue is  identified. Clinical correlation is needed to determine if this  represents metastasis or direct extension from the kidney tumor.    ASSESSMENT/PLAN Cancer Staging Transitional cell carcinoma of kidney, right [facility]) Staging form: Kidney, AJCC 8th Edition - Clinical stage from 05/08/2017: Stage IV (cT3, cNX, cM1) - Signed by Earlie Server, MD on 05/08/2017  1. Transitional cell carcinoma of kidney, right ([place])   2. Bladder neurogenesis   3. Weakness of lower extremity, unspecified laterality   4. CKD (chronic kidney disease) stage 3, GFR 30-59 ml/min    Pathology and image results were discussed with patient and his families.  He has locally advanced urothelia carcinoma of the kidney.  # Obtain MRI spine to better characterize his L2 lesion. Will hold PET in the acute post operative setting due to false positives.  CKD 3 and poor performance status, Cisplatin ineligible.  Gemcitabine +/- carboplatin as first line chemotherapy option was discussed with patient.  Will send PD-L1 test. If possible, front line immunotherapy is preferred.   Follow up a few days after MRI resulted and discuss about treatment plan.   Orders Placed This Encounter   Procedures  . MR Thoracic Spine W Wo Contrast    Standing Status:   Future    Standing Expiration Date:   05/08/2018    Order Specific Question:   GRA to provide read?    Answer:   Yes    Order Specific Question:   If indicated for the ordered procedure, I authorize the administration of contrast media per Radiology protocol    Answer:   Yes    Order Specific Question:   What is the patient's sedation requirement?    Answer:   No Sedation    Order Specific Question:   Does the patient have a pacemaker or implanted devices?    Answer:   No    Order Specific Question:   Preferred imaging location?    Answer:   [facility] (table limit-350lbs)    Order Specific Question:   Radiology Contrast Protocol - do NOT remove file path    Answer:   \\[email or web address]  . MR Lumbar Spine W Wo Contrast    Standing Status:   Future    Standing Expiration Date:   05/08/2018    Order Specific Question:   If indicated for the ordered procedure, I authorize the administration of contrast media per Radiology protocol    Answer:   Yes    Order Specific Question:   What is the patient's sedation requirement?    Answer:   No Sedation    Order Specific Question:   Does the patient have a pacemaker or implanted devices?    Answer:   No    Order Specific Question:   Radiology Contrast Protocol - do NOT remove file path    Answer:   [email or web address]    Order Specific Question:   Preferred imaging location?    Answer:   [facility] (table limit-350lbs)  All questions were answered. The patient knows to call the clinic with any problems, questions or concerns.    Earlie Server, MD  05/08/2017 10:43 PM

## 2017-05-10 ENCOUNTER — Ambulatory Visit: Payer: Medicare Other

## 2017-05-11 ENCOUNTER — Ambulatory Visit: Payer: Medicare Other

## 2017-05-14 ENCOUNTER — Ambulatory Visit
Admission: RE | Admit: 2017-05-14 | Discharge: 2017-05-14 | Disposition: A | Discharge status: Home / Self Care | Payer: Medicare Other | Source: Ambulatory Visit | Attending: Oncology | Admitting: Oncology

## 2017-05-14 DIAGNOSIS — C641 Malignant neoplasm of right kidney, except renal pelvis: Secondary | ICD-10-CM

## 2017-05-14 DIAGNOSIS — M4807 Spinal stenosis, lumbosacral region: Secondary | ICD-10-CM | POA: Insufficient documentation

## 2017-05-14 DIAGNOSIS — C7951 Secondary malignant neoplasm of bone: Secondary | ICD-10-CM | POA: Insufficient documentation

## 2017-05-14 DIAGNOSIS — M48061 Spinal stenosis, lumbar region without neurogenic claudication: Secondary | ICD-10-CM | POA: Diagnosis not present

## 2017-05-14 DIAGNOSIS — R1900 Intra-abdominal and pelvic swelling, mass and lump, unspecified site: Secondary | ICD-10-CM | POA: Diagnosis not present

## 2017-05-14 LAB — SURGICAL PATHOLOGY

## 2017-05-14 MED ORDER — GADOBENATE DIMEGLUMINE 529 MG/ML IV SOLN
15.0000 mL | Freq: Once | INTRAVENOUS | Status: AC | PRN
  Administered 2017-05-14: 15 mL via INTRAVENOUS

## 2017-05-15 ENCOUNTER — Encounter: Payer: Self-pay | Admitting: Urology

## 2017-05-16 ENCOUNTER — Inpatient Hospital Stay: Payer: Medicare Other | Admitting: Oncology

## 2017-05-16 ENCOUNTER — Ambulatory Visit (INDEPENDENT_AMBULATORY_CARE_PROVIDER_SITE_OTHER): Payer: Medicare Other | Admitting: Urology

## 2017-05-16 ENCOUNTER — Encounter: Payer: Self-pay | Admitting: Urology

## 2017-05-16 VITALS — BP 156/89 | HR 93 | Ht 66.0 in | Wt 161.0 lb

## 2017-05-16 DIAGNOSIS — C641 Malignant neoplasm of right kidney, except renal pelvis: Secondary | ICD-10-CM

## 2017-05-16 DIAGNOSIS — N319 Neuromuscular dysfunction of bladder, unspecified: Secondary | ICD-10-CM

## 2017-05-16 NOTE — Progress Notes (Signed)
05/16/2017 11:17 AM   Joseph Ritter Top 1941-04-25 [id number]  Referring provider: [name], MD [facility] [place] [address]  Chief Complaint  Patient presents with  . Routine Post Op    HPI: The patient is a 76 year old gentleman presents for postoperative follow-up after an aborted right robotic nephroureterectomy due to unresectable newly metastatic disease that was diagnosed intraoperatively.  He originally was found to have an endophytic hypodense mass by CT 08/2016 on eval hematuria, felt likely resolving pyelo. Repeat imaging 01/2017 much more concerning with enlarging endophytic mass with right upper pole hydro and peri-renal neovascularity. No adenopathy. 3 artery / 2 vein right renovascular anatomy. 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 His renal pelvis was completely full of tumor and only the distal portion of the renal pelvis was able to be visualized. The tumor blocked access to the proximal renal pelvis as well as the middle, upper, lower poles. Pathology on biopsy was high grade upper tract TCC. During his right robotic nephroureterectomy which was delayed due to cardiac clearance and need for angiography he is noted to have unresectable newly metastatic disease with invasion of the renal vein and possibly the IVC. Postoperative repeat CT scan of the abdomen and pelvis to confirm extension of the infiltrative mass in the right kidney along the right renal vein into the IVC with encasement of the IVC. CT chest was unremarkable. He saw medical oncology which is currently testing for PD L1 to see if he is also for immunotherapy. If not, he would a candidate for gemcitabine and carboplatinum as she is cisplatin ineligible. He also underwent MRI of his spine which showed local invasion of tumor into L2.  He also has a history of transverse myelitis resulting neurogenic bladder. He  manages this with CIC.   PMH: Past Medical History:  Diagnosis Date  . Acid indigestion 03/09/2014  . Adult hypothyroidism 11/18/2012  . Anxiety   . Arteriosclerosis of coronary artery 11/18/2012   Overview:  PATENT LIMA TO LAD, PATENT SVG TO PDA AND OCCULUDED SVG TO OM2 BY CATHERIZATION 02/09/2009   . 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 Borderline diabetes 05/25/2014  . BP (high blood pressure) 11/18/2012  . BPH (benign prostatic hypertrophy)   . CAFL (chronic airflow limitation) ([place]) 05/03/2015  . CCF (congestive cardiac failure) ([place]) 01/03/2014   Overview:  HX OF   . Clinical depression 05/03/2015  . COPD (chronic obstructive pulmonary disease) ([place])   . DDD (degenerative disc disease), lumbar 06/29/2014  . 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 Gastric ulcer 03/09/2014  . 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 History of urinary self-catheterization 2017  . HLD (hyperlipidemia) 01/03/2014  . Incomplete bladder emptying 11/18/2012  . Leg weakness 11/18/2012  . Lumbar radiculitis   . Neuritis or radiculitis due to rupture of lumbar intervertebral disc 06/29/2014  . Overactive bladder   . PONV (postoperative nausea and vomiting)    years ago with Ether, no problem with Nausea or vomiting with the last few surgerys  . Pre-diabetes   . Subacute transverse myelitis ([place]) 11/21/2012  . Teeth problem    pt reports "bad teeth", "need to be pulled"    Surgical History: Past Surgical History:  Procedure Laterality Date  . ARTERY BIOPSY Right 12/16/2015  Procedure: BIOPSY TEMPORAL ARTERY;  Surgeon: Algernon Huxley, MD;  Location: [facility];  Service: Vascular;  Laterality: Right;  . CARDIAC CATHETERIZATION    . CATARACT EXTRACTION W/PHACO Left 01/26/2016   Procedure: CATARACT EXTRACTION PHACO AND INTRAOCULAR LENS PLACEMENT (IOC) LEFT EYE;  Surgeon: Leandrew Koyanagi, MD;  Location: [facility];  Service: Ophthalmology;  Laterality: Left;  . CORONARY ARTERY BYPASS GRAFT  04/07/2002   [facility]  . Cysto Bladder Botox Injection  07/02/2014  . CYSTOSCOPY W/ RETROGRADES Right 03/16/2017   Procedure: CYSTOSCOPY WITH RETROGRADE PYELOGRAM;  Surgeon: [name], MD;  Location: [facility];  Service: Urology;  Laterality: Right;  . CYSTOSCOPY WITH STENT PLACEMENT Right 03/16/2017   Procedure: CYSTOSCOPY WITH STENT PLACEMENT;  Surgeon: [name], MD;  Location: [facility];  Service: Urology;  Laterality: Right;  . EYE SURGERY Left    blood clot behind left eye  . HAND SURGERY Bilateral   . HERNIA REPAIR Right    inguinal hernia repair  . LEFT HEART CATH AND CORS/GRAFTS ANGIOGRAPHY N/A 04/25/2017   Procedure: Left Heart Cath and Cors/Grafts Angiography;  Surgeon: Isaias Cowman, MD;  Location: [facility];  Service: Cardiovascular;  Laterality: N/A;  . ROBOT ASSITED LAPAROSCOPIC NEPHROURETERECTOMY Right 05/02/2017   Procedure: ROBOT ASSITED LAPAROSCOPIC NEPHROURETERECTOMY ATTEMPTED;  Surgeon: [name], MD;  Location: [facility];  Service: Urology;  Laterality: Right;  . URETEROSCOPY Right 03/16/2017   Procedure: URETEROSCOPY BIOPSY RENAL MASS;  Surgeon: [name], MD;  Location: [facility];  Service: Urology;  Laterality: Right;    Home Medications:  Allergies as of 05/16/2017   No Known Allergies     Medication List       Accurate as of 05/16/17 11:17 AM. Always use your most recent med list.          acetaminophen 500 MG tablet Commonly known as:  TYLENOL Take 500 mg by mouth 2 (two) times daily as needed (for pain.).   aspirin EC 81 MG tablet Take 81 mg by mouth daily after breakfast.   buPROPion 100 MG 12 hr tablet Commonly known as:  WELLBUTRIN SR Take 100 mg by mouth daily after breakfast.   CITRUCEL PO Take 1 tablet by mouth daily after breakfast.   cyclobenzaprine 10 MG tablet Commonly  known as:  FLEXERIL Take 10 mg by mouth at bedtime as needed for muscle spasms.   ezetimibe-simvastatin 10-40 MG tablet Commonly known as:  VYTORIN Take 1 tablet by mouth at bedtime.   HYDROcodone-acetaminophen 5-325 MG tablet Commonly known as:  NORCO Take 1 tablet by mouth every 4 (four) hours as needed for moderate pain.   hyoscyamine 0.125 MG Tbdp disintergrating tablet Commonly known as:  ANASPAZ Place 0.125 mg under the tongue every 6 (six) hours as needed (for abdominal cramping due to IBS).   ketoconazole 2 % shampoo Commonly known as:  NIZORAL APPLY SHAMPOO TO HAIR AS NEEDED FOR FLAKY/ITCHY SCALP (TYPICALLY EVERY 2-3 DAYS)   levothyroxine 100 MCG tablet Commonly known as:  SYNTHROID, LEVOTHROID Take 100 mcg by mouth daily before breakfast.   multivitamin with minerals Tabs tablet Take 1 tablet by mouth daily after breakfast. CENTRUM SILVER   PEPPERMINT OIL PO Take 1 capsule by mouth 3 (three) times daily as needed (for IBS). IBgard (active ingredient: 90 mg ultrapurified peppermint oil)   polyethylene glycol powder powder Commonly known as:  GLYCOLAX/MIRALAX Take 17 g by mouth daily after breakfast. Mix with glass of water  PROBIOTIC PO Take 1 capsule by mouth daily as needed (for digestive health (see instructions)). AFTER BREAKFAST EITHER TAKE A PROBIOTIC OR EAT YOGURT   RABEprazole 20 MG tablet Commonly known as:  ACIPHEX Take 20 mg by mouth daily before breakfast.   ramipril 10 MG capsule Commonly known as:  ALTACE Take 10 mg by mouth daily after breakfast.   senna 8.6 MG Tabs tablet Commonly known as:  SENOKOT Take 2 tablets (17.2 mg total) by mouth daily.   sertraline 100 MG tablet Commonly known as:  ZOLOFT Take 100 mg by mouth daily after breakfast.   tamsulosin 0.4 MG Caps capsule Commonly known as:  FLOMAX Take 0.8 mg by mouth daily after supper.   traMADol 50 MG tablet Commonly known as:  ULTRAM Take 50 mg by mouth 2 (two) times daily as  needed (for pain.).       Allergies: No Known Allergies  Family History: Family History  Problem Relation Age of Onset  . Heart attack Mother   . Heart disease Father   . Prostate cancer Neg Hx   . Bladder Cancer Neg Hx   . Kidney cancer Neg Hx     Social History:  reports that he quit smoking about 15 years ago. His smoking use included Cigarettes. He has quit using smokeless tobacco. His smokeless tobacco use included Snuff and Chew. He reports that he does not drink alcohol or use drugs.  ROS: UROLOGY Frequent Urination?: Yes Hard to postpone urination?: Yes Burning/pain with urination?: No Get up at night to urinate?: Yes Leakage of urine?: Yes Urine stream starts and stops?: Yes Trouble starting stream?: Yes Do you have to strain to urinate?: No Blood in urine?: No Urinary tract infection?: No Sexually transmitted disease?: No Injury to kidneys or bladder?: No Painful intercourse?: No Weak stream?: Yes Erection problems?: No Penile pain?: No  Gastrointestinal Nausea?: No Vomiting?: No Indigestion/heartburn?: No Diarrhea?: No Constipation?: No  Constitutional Fever: No Night sweats?: No Weight loss?: Yes Fatigue?: No  Skin Skin rash/lesions?: No Itching?: No  Eyes Blurred vision?: No Double vision?: No  Ears/Nose/Throat Sore throat?: No Sinus problems?: No  Hematologic/Lymphatic Swollen glands?: No Easy bruising?: No  Cardiovascular Leg swelling?: No Chest pain?: No  Respiratory Cough?: No Shortness of breath?: No  Endocrine Excessive thirst?: No  Musculoskeletal Back pain?: Yes Joint pain?: No  Neurological Headaches?: No Dizziness?: No  Psychologic Depression?: Yes Anxiety?: No  Physical Exam: BP (!) 156/89   Pulse 93   Ht 5\' 6"  (1.676 m)   Wt 161 lb (73 kg)   BMI 25.99 kg/m   Constitutional:  Alert and oriented, No acute distress. HEENT: [place] AT, moist mucus membranes.  Trachea midline, no masses. Cardiovascular:  No clubbing, cyanosis, or edema. Respiratory: Normal respiratory effort, no increased work of breathing. GI: Abdomen is soft, nontender, nondistended, no abdominal masses GU: No CVA tenderness.  Skin: No rashes, bruises or suspicious lesions. Lymph: No cervical or inguinal adenopathy. Neurologic: Grossly intact, no focal deficits, moving all 4 extremities. Psychiatric: Normal mood and affect.  Laboratory Data: Lab Results  Component Value Date   WBC 12.5 (H) 05/04/2017   HGB 11.5 (L) 05/04/2017   HCT 34.7 (L) 05/04/2017   MCV 83.2 05/04/2017   PLT 147 (L) 05/04/2017    Lab Results  Component Value Date   CREATININE 1.20 05/04/2017    No results found for: PSA  No results found for: TESTOSTERONE  No results found for: HGBA1C  Urinalysis  Component Value Date/Time   APPEARANCEUR Cloudy (A) 03/23/2017 1338   GLUCOSEU Negative 03/23/2017 1338   BILIRUBINUR Negative 03/23/2017 1338   PROTEINUR 3+ (A) 03/23/2017 1338   NITRITE Negative 03/23/2017 1338   LEUKOCYTESUR 1+ (A) 03/23/2017 1338     Assessment & Plan:  .  1. 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 He has transitioned his oncological care to the cancer center with medical oncology at this time.  2. Neurogenic bladder The patient will follow up on an annual basis for urological monitoring of his neurogenic bladder and CIC  Return in about 1 year (around 05/16/2018).  [name], MD  [facility] [address] [place], [place] [address] [phone]

## 2017-05-17 ENCOUNTER — Inpatient Hospital Stay (HOSPITAL_BASED_OUTPATIENT_CLINIC_OR_DEPARTMENT_OTHER): Payer: Medicare Other | Admitting: Oncology

## 2017-05-17 VITALS — BP 111/71 | Temp 95.0°F | Wt 163.0 lb

## 2017-05-17 DIAGNOSIS — R531 Weakness: Secondary | ICD-10-CM | POA: Diagnosis not present

## 2017-05-17 DIAGNOSIS — R262 Difficulty in walking, not elsewhere classified: Secondary | ICD-10-CM

## 2017-05-17 DIAGNOSIS — N319 Neuromuscular dysfunction of bladder, unspecified: Secondary | ICD-10-CM | POA: Diagnosis not present

## 2017-05-17 DIAGNOSIS — C651 Malignant neoplasm of right renal pelvis: Secondary | ICD-10-CM | POA: Diagnosis not present

## 2017-05-17 DIAGNOSIS — C679 Malignant neoplasm of bladder, unspecified: Secondary | ICD-10-CM | POA: Diagnosis not present

## 2017-05-17 DIAGNOSIS — C7951 Secondary malignant neoplasm of bone: Secondary | ICD-10-CM

## 2017-05-17 DIAGNOSIS — N3281 Overactive bladder: Secondary | ICD-10-CM

## 2017-05-17 DIAGNOSIS — R5383 Other fatigue: Secondary | ICD-10-CM | POA: Diagnosis not present

## 2017-05-17 DIAGNOSIS — E785 Hyperlipidemia, unspecified: Secondary | ICD-10-CM

## 2017-05-17 DIAGNOSIS — M5136 Other intervertebral disc degeneration, lumbar region: Secondary | ICD-10-CM

## 2017-05-17 DIAGNOSIS — N183 Chronic kidney disease, stage 3 (moderate): Secondary | ICD-10-CM | POA: Diagnosis not present

## 2017-05-17 DIAGNOSIS — M549 Dorsalgia, unspecified: Secondary | ICD-10-CM | POA: Diagnosis not present

## 2017-05-17 DIAGNOSIS — I714 Abdominal aortic aneurysm, without rupture: Secondary | ICD-10-CM

## 2017-05-17 DIAGNOSIS — Z7189 Other specified counseling: Secondary | ICD-10-CM

## 2017-05-17 DIAGNOSIS — R140 Abdominal distension (gaseous): Secondary | ICD-10-CM

## 2017-05-17 DIAGNOSIS — J449 Chronic obstructive pulmonary disease, unspecified: Secondary | ICD-10-CM

## 2017-05-17 DIAGNOSIS — I2510 Atherosclerotic heart disease of native coronary artery without angina pectoris: Secondary | ICD-10-CM

## 2017-05-17 DIAGNOSIS — Z87891 Personal history of nicotine dependence: Secondary | ICD-10-CM

## 2017-05-17 DIAGNOSIS — E039 Hypothyroidism, unspecified: Secondary | ICD-10-CM

## 2017-05-17 DIAGNOSIS — K5900 Constipation, unspecified: Secondary | ICD-10-CM

## 2017-05-17 DIAGNOSIS — C641 Malignant neoplasm of right kidney, except renal pelvis: Secondary | ICD-10-CM

## 2017-05-17 DIAGNOSIS — N400 Enlarged prostate without lower urinary tract symptoms: Secondary | ICD-10-CM

## 2017-05-17 DIAGNOSIS — Z7982 Long term (current) use of aspirin: Secondary | ICD-10-CM

## 2017-05-17 DIAGNOSIS — Z79899 Other long term (current) drug therapy: Secondary | ICD-10-CM

## 2017-05-17 DIAGNOSIS — K4090 Unilateral inguinal hernia, without obstruction or gangrene, not specified as recurrent: Secondary | ICD-10-CM

## 2017-05-17 DIAGNOSIS — R7303 Prediabetes: Secondary | ICD-10-CM

## 2017-05-17 DIAGNOSIS — I700 Atherosclerosis of aorta: Secondary | ICD-10-CM

## 2017-05-17 MED ORDER — OXYCODONE HCL ER 15 MG PO T12A: 15.0000 mg | EXTENDED_RELEASE_TABLET | Freq: Two times a day (BID) | ORAL | 0 refills | Status: DC

## 2017-05-17 NOTE — Progress Notes (Signed)
Patient here today for follow up.   

## 2017-05-18 NOTE — Patient Instructions (Signed)
Gemcitabine injection What is this medicine? GEMCITABINE (jem SIT a been) is a chemotherapy drug. This medicine is used to treat many types of cancer like breast cancer, lung cancer, pancreatic cancer, and ovarian cancer. This medicine may be used for other purposes; ask your health care provider or pharmacist if you have questions. COMMON BRAND NAME(S): Gemzar What should I tell my health care provider before I take this medicine? They need to know if you have any of these conditions: -blood disorders -infection -kidney disease -liver disease -recent or ongoing radiation therapy -an unusual or allergic reaction to gemcitabine, other chemotherapy, other medicines, foods, dyes, or preservatives -pregnant or trying to get pregnant -breast-feeding How should I use this medicine? This drug is given as an infusion into a vein. It is administered in a hospital or clinic by a specially trained health care professional. Talk to your pediatrician regarding the use of this medicine in children. Special care may be needed. Overdosage: If you think you have taken too much of this medicine contact a poison control center or emergency room at once. NOTE: This medicine is only for you. Do not share this medicine with others. What if I miss a dose? It is important not to miss your dose. Call your doctor or health care professional if you are unable to keep an appointment. What may interact with this medicine? -medicines to increase blood counts like filgrastim, pegfilgrastim, sargramostim -some other chemotherapy drugs like cisplatin -vaccines Talk to your doctor or health care professional before taking any of these medicines: -acetaminophen -aspirin -ibuprofen -ketoprofen -naproxen This list may not describe all possible interactions. Give your health care provider a list of all the medicines, herbs, non-prescription drugs, or dietary supplements you use. Also tell them if you smoke, drink alcohol,  or use illegal drugs. Some items may interact with your medicine. What should I watch for while using this medicine? Visit your doctor for checks on your progress. This drug may make you feel generally unwell. This is not uncommon, as chemotherapy can affect healthy cells as well as cancer cells. Report any side effects. Continue your course of treatment even though you feel ill unless your doctor tells you to stop. In some cases, you may be given additional medicines to help with side effects. Follow all directions for their use. Call your doctor or health care professional for advice if you get a fever, chills or sore throat, or other symptoms of a cold or flu. Do not treat yourself. This drug decreases your body's ability to fight infections. Try to avoid being around people who are sick. This medicine may increase your risk to bruise or bleed. Call your doctor or health care professional if you notice any unusual bleeding. Be careful brushing and flossing your teeth or using a toothpick because you may get an infection or bleed more easily. If you have any dental work done, tell your dentist you are receiving this medicine. Avoid taking products that contain aspirin, acetaminophen, ibuprofen, naproxen, or ketoprofen unless instructed by your doctor. These medicines may hide a fever. Women should inform their doctor if they wish to become pregnant or think they might be pregnant. There is a potential for serious side effects to an unborn child. Talk to your health care professional or pharmacist for more information. Do not breast-feed an infant while taking this medicine. What side effects may I notice from receiving this medicine? Side effects that you should report to your doctor or health care professional as   soon as possible: -allergic reactions like skin rash, itching or hives, swelling of the face, lips, or tongue -low blood counts - this medicine may decrease the number of white blood cells,  red blood cells and platelets. You may be at increased risk for infections and bleeding. -signs of infection - fever or chills, cough, sore throat, pain or difficulty passing urine -signs of decreased platelets or bleeding - bruising, pinpoint red spots on the skin, black, tarry stools, blood in the urine -signs of decreased red blood cells - unusually weak or tired, fainting spells, lightheadedness -breathing problems -chest pain -mouth sores -nausea and vomiting -pain, swelling, redness at site where injected -pain, tingling, numbness in the hands or feet -stomach pain -swelling of ankles, feet, hands -unusual bleeding Side effects that usually do not require medical attention (report to your doctor or health care professional if they continue or are bothersome): -constipation -diarrhea -hair loss -loss of appetite -stomach upset This list may not describe all possible side effects. Call your doctor for medical advice about side effects. You may report side effects to FDA at 1-800-FDA-1088. Where should I keep my medicine? This drug is given in a hospital or clinic and will not be stored at home. NOTE: This sheet is a summary. It may not cover all possible information. If you have questions about this medicine, talk to your doctor, pharmacist, or health care provider.  2018 Elsevier/Gold Standard (2008-01-21 18:45:54) Carboplatin injection What is this medicine? CARBOPLATIN (KAR boe pla tin) is a chemotherapy drug. It targets fast dividing cells, like cancer cells, and causes these cells to die. This medicine is used to treat ovarian cancer and many other cancers. This medicine may be used for other purposes; ask your health care provider or pharmacist if you have questions. COMMON BRAND NAME(S): Paraplatin What should I tell my health care provider before I take this medicine? They need to know if you have any of these conditions: -blood disorders -hearing problems -kidney  disease -recent or ongoing radiation therapy -an unusual or allergic reaction to carboplatin, cisplatin, other chemotherapy, other medicines, foods, dyes, or preservatives -pregnant or trying to get pregnant -breast-feeding How should I use this medicine? This drug is usually given as an infusion into a vein. It is administered in a hospital or clinic by a specially trained health care professional. Talk to your pediatrician regarding the use of this medicine in children. Special care may be needed. Overdosage: If you think you have taken too much of this medicine contact a poison control center or emergency room at once. NOTE: This medicine is only for you. Do not share this medicine with others. What if I miss a dose? It is important not to miss a dose. Call your doctor or health care professional if you are unable to keep an appointment. What may interact with this medicine? -medicines for seizures -medicines to increase blood counts like filgrastim, pegfilgrastim, sargramostim -some antibiotics like amikacin, gentamicin, neomycin, streptomycin, tobramycin -vaccines Talk to your doctor or health care professional before taking any of these medicines: -acetaminophen -aspirin -ibuprofen -ketoprofen -naproxen This list may not describe all possible interactions. Give your health care provider a list of all the medicines, herbs, non-prescription drugs, or dietary supplements you use. Also tell them if you smoke, drink alcohol, or use illegal drugs. Some items may interact with your medicine. What should I watch for while using this medicine? Your condition will be monitored carefully while you are receiving this medicine. You will need   important blood work done while you are taking this medicine. This drug may make you feel generally unwell. This is not uncommon, as chemotherapy can affect healthy cells as well as cancer cells. Report any side effects. Continue your course of treatment even  though you feel ill unless your doctor tells you to stop. In some cases, you may be given additional medicines to help with side effects. Follow all directions for their use. Call your doctor or health care professional for advice if you get a fever, chills or sore throat, or other symptoms of a cold or flu. Do not treat yourself. This drug decreases your body's ability to fight infections. Try to avoid being around people who are sick. This medicine may increase your risk to bruise or bleed. Call your doctor or health care professional if you notice any unusual bleeding. Be careful brushing and flossing your teeth or using a toothpick because you may get an infection or bleed more easily. If you have any dental work done, tell your dentist you are receiving this medicine. Avoid taking products that contain aspirin, acetaminophen, ibuprofen, naproxen, or ketoprofen unless instructed by your doctor. These medicines may hide a fever. Do not become pregnant while taking this medicine. Women should inform their doctor if they wish to become pregnant or think they might be pregnant. There is a potential for serious side effects to an unborn child. Talk to your health care professional or pharmacist for more information. Do not breast-feed an infant while taking this medicine. What side effects may I notice from receiving this medicine? Side effects that you should report to your doctor or health care professional as soon as possible: -allergic reactions like skin rash, itching or hives, swelling of the face, lips, or tongue -signs of infection - fever or chills, cough, sore throat, pain or difficulty passing urine -signs of decreased platelets or bleeding - bruising, pinpoint red spots on the skin, black, tarry stools, nosebleeds -signs of decreased red blood cells - unusually weak or tired, fainting spells, lightheadedness -breathing problems -changes in hearing -changes in vision -chest pain -high  blood pressure -low blood counts - This drug may decrease the number of white blood cells, red blood cells and platelets. You may be at increased risk for infections and bleeding. -nausea and vomiting -pain, swelling, redness or irritation at the injection site -pain, tingling, numbness in the hands or feet -problems with balance, talking, walking -trouble passing urine or change in the amount of urine Side effects that usually do not require medical attention (report to your doctor or health care professional if they continue or are bothersome): -hair loss -loss of appetite -metallic taste in the mouth or changes in taste This list may not describe all possible side effects. Call your doctor for medical advice about side effects. You may report side effects to FDA at 1-800-FDA-1088. Where should I keep my medicine? This drug is given in a hospital or clinic and will not be stored at home. NOTE: This sheet is a summary. It may not cover all possible information. If you have questions about this medicine, talk to your doctor, pharmacist, or health care provider.  2018 Elsevier/Gold Standard (2007-12-17 14:38:05)  

## 2017-05-19 ENCOUNTER — Encounter: Payer: Self-pay | Admitting: Oncology

## 2017-05-19 DIAGNOSIS — C7951 Secondary malignant neoplasm of bone: Secondary | ICD-10-CM | POA: Insufficient documentation

## 2017-05-19 NOTE — Progress Notes (Addendum)
[facility] Initial Visit:  Patient Care Team: [name], MD as PCP - General (Physician Assistant)  CHIEF COMPLAINTS/PURPOSE OF CONSULTATION: Locally advanced kidney urothelia cancer  HISTORY OF PRESENTING ILLNESS: Joseph Ritter 76 y.o. male with PMH listed as below  is referred to [place] for evaluation of locally advanced kidney urothelia cancer. Patient developed gross hematuria at the end of 2017. CT 08/2016 showed endophytic hypodense mass. Repeat image 01/2017 showed enlarged  endophytic hypodense mass right kidney. 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 Right robotic nephroureterectomy was attempted but aborted due to unresectable newly metastatic disease diagnosed intraoperatively. Postop he underwent a CT scan of the abdomen and pelvis which showed extension of an infiltrative mass from the right kidney along the right renal vein toward the IVC with encasement of the IVC.  Today patient is accompanied by his wife, sister and brother in law. 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 He self catheterize a few time a day.  He reports having back pain which limits her ability to walk. Also constipated and abdominal bloating. His appetite is fair. He takes Norco for abdominal and back pain.  He can walk with walker for very short distance, mostly limited by back pain and fatigue./weakness.  INTERVAL HISTORY Patient presents to discuss about the image results. He complains severe back pain. He takes norco 7.5/325 every 6 hours and get some relief.    Review of Systems  Constitutional: Positive for appetite change and fatigue.  HENT:  Negative.   Eyes: Negative.   Respiratory: Negative.   Cardiovascular: Negative.   Gastrointestinal: Positive for abdominal distention and constipation.  Endocrine: Negative.   Genitourinary: Positive for difficulty urinating.   Musculoskeletal:  Positive for back pain.  Skin: Negative.     MEDICAL HISTORY: Past Medical History:  Diagnosis Date  . Acid indigestion 03/09/2014  . Adult hypothyroidism 11/18/2012  . Anxiety   . Arteriosclerosis of coronary artery 11/18/2012   Overview:  PATENT LIMA TO LAD, PATENT SVG TO PDA AND OCCULUDED SVG TO OM2 BY CATHERIZATION 02/09/2009   . 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 Borderline diabetes 05/25/2014  . BP (high blood pressure) 11/18/2012  . BPH (benign prostatic hypertrophy)   . CAFL (chronic airflow limitation) ([place]) 05/03/2015  . CCF (congestive cardiac failure) ([place]) 01/03/2014   Overview:  HX OF   . Clinical depression 05/03/2015  . COPD (chronic obstructive pulmonary disease) ([place])   . DDD (degenerative disc disease), lumbar 06/29/2014  . 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 Gastric ulcer 03/09/2014  . 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 History of urinary self-catheterization 2017  . HLD (hyperlipidemia) 01/03/2014  . Incomplete bladder emptying 11/18/2012  . Leg weakness 11/18/2012  . Lumbar radiculitis   . Neuritis or radiculitis due to rupture of lumbar intervertebral disc 06/29/2014  . Overactive bladder   . PONV (postoperative nausea and vomiting)    years ago with Ether, no problem with Nausea or vomiting with the last few surgerys  . Pre-diabetes   . Subacute transverse myelitis ([place]) 11/21/2012  . Teeth problem    pt reports "bad teeth", "need to be pulled"    SURGICAL HISTORY: Past Surgical History:  Procedure Laterality Date  . ARTERY BIOPSY Right 12/16/2015  Procedure: BIOPSY TEMPORAL ARTERY;  Surgeon: Algernon Huxley, MD;  Location: [facility];  Service: Vascular;  Laterality: Right;  . CARDIAC CATHETERIZATION    . CATARACT EXTRACTION W/PHACO Left 01/26/2016   Procedure: CATARACT EXTRACTION PHACO AND INTRAOCULAR LENS PLACEMENT (IOC)  LEFT EYE;  Surgeon: Leandrew Koyanagi, MD;  Location: [place];  Service: Ophthalmology;  Laterality: Left;  . CORONARY ARTERY BYPASS GRAFT  04/07/2002   [facility]  . Cysto Bladder Botox Injection  07/02/2014  . CYSTOSCOPY W/ RETROGRADES Right 03/16/2017   Procedure: CYSTOSCOPY WITH RETROGRADE PYELOGRAM;  Surgeon: [name], MD;  Location: [facility];  Service: Urology;  Laterality: Right;  . CYSTOSCOPY WITH STENT PLACEMENT Right 03/16/2017   Procedure: CYSTOSCOPY WITH STENT PLACEMENT;  Surgeon: [name], MD;  Location: [facility];  Service: Urology;  Laterality: Right;  . EYE SURGERY Left    blood clot behind left eye  . HAND SURGERY Bilateral   . HERNIA REPAIR Right    inguinal hernia repair  . LEFT HEART CATH AND CORS/GRAFTS ANGIOGRAPHY N/A 04/25/2017   Procedure: Left Heart Cath and Cors/Grafts Angiography;  Surgeon: Isaias Cowman, MD;  Location: [facility];  Service: Cardiovascular;  Laterality: N/A;  . ROBOT ASSITED LAPAROSCOPIC NEPHROURETERECTOMY Right 05/02/2017   Procedure: ROBOT ASSITED LAPAROSCOPIC NEPHROURETERECTOMY ATTEMPTED;  Surgeon: [name], MD;  Location: [facility];  Service: Urology;  Laterality: Right;  . URETEROSCOPY Right 03/16/2017   Procedure: URETEROSCOPY BIOPSY RENAL MASS;  Surgeon: [name], MD;  Location: [facility];  Service: Urology;  Laterality: Right;    SOCIAL HISTORY: Social History   Social History  . Marital status: Married    Spouse name: N/A  . Number of children: N/A  . Years of education: N/A   Occupational History  . Not on file.   Social History Main Topics  . Smoking status: Former Smoker    Types: Cigarettes    Quit date: 04/07/2002  . Smokeless tobacco: Former Systems developer    Types: Snuff, Chew  . Alcohol use No  . Drug use: No  . Sexual activity: Not on file   Other Topics Concern  . Not on file   Social History Narrative  . No narrative on file    FAMILY HISTORY Family History   Problem Relation Age of Onset  . Heart attack Mother   . Heart disease Father   . Prostate cancer Neg Hx   . Bladder Cancer Neg Hx   . Kidney cancer Neg Hx     ALLERGIES:  has No Known Allergies.  MEDICATIONS:  Current Outpatient Prescriptions  Medication Sig Dispense Refill  . acetaminophen (TYLENOL) 500 MG tablet Take 500 mg by mouth 2 (two) times daily as needed (for pain.).     Marland Kitchen aspirin EC 81 MG tablet Take 81 mg by mouth daily after breakfast.     . buPROPion (WELLBUTRIN SR) 100 MG 12 hr tablet Take 100 mg by mouth daily after breakfast.    . cyclobenzaprine (FLEXERIL) 10 MG tablet Take 10 mg by mouth at bedtime as needed for muscle spasms.    Marland Kitchen ezetimibe-simvastatin (VYTORIN) 10-40 MG tablet Take 1 tablet by mouth at bedtime.    . 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    Marland Kitchen ketoconazole (NIZORAL) 2 % shampoo APPLY SHAMPOO TO HAIR AS NEEDED FOR FLAKY/ITCHY SCALP (TYPICALLY EVERY 2-3 DAYS)    .  levothyroxine (SYNTHROID, LEVOTHROID) 100 MCG tablet Take 100 mcg by mouth daily before breakfast.    . Methylcellulose, Laxative, (CITRUCEL PO) Take 1 tablet by mouth daily after breakfast.    . Multiple Vitamin (MULTIVITAMIN WITH MINERALS) TABS tablet Take 1 tablet by mouth daily after breakfast. CENTRUM SILVER    . PEPPERMINT OIL PO Take 1 capsule by mouth 3 (three) times daily as needed (for IBS). IBgard (active ingredient: 90 mg ultrapurified peppermint oil)    . polyethylene glycol powder (GLYCOLAX/MIRALAX) powder Take 17 g by mouth daily after breakfast. Mix with glass of water 255 g 1  . Probiotic Product (PROBIOTIC PO) Take 1 capsule by mouth daily as needed (for digestive health (see instructions)). AFTER BREAKFAST EITHER TAKE A PROBIOTIC OR EAT YOGURT    . RABEprazole (ACIPHEX) 20 MG tablet Take 20 mg by mouth daily before breakfast.     . ramipril (ALTACE) 10 MG  capsule Take 10 mg by mouth daily after breakfast.    . senna (SENOKOT) 8.6 MG TABS tablet Take 2 tablets (17.2 mg total) by mouth daily. 120 each 0  . sertraline (ZOLOFT) 100 MG tablet Take 100 mg by mouth daily after breakfast.    . tamsulosin (FLOMAX) 0.4 MG CAPS capsule Take 0.8 mg by mouth daily after supper.     Marland Kitchen HYDROcodone-acetaminophen (NORCO) 7.5-325 MG tablet   0  . oxyCODONE (OXYCONTIN) 15 mg 12 hr tablet Take 1 tablet (15 mg total) by mouth every 12 (twelve) hours. 28 tablet 0   No current facility-administered medications for this visit.     PHYSICAL EXAMINATION:  ECOG PERFORMANCE STATUS: 2 - Symptomatic, <50% confined to bed   Vitals:   05/17/17 1416  BP: 111/71  Temp: (!) 95 F (35 C)    Filed Weights   05/17/17 1416  Weight: 163 lb (73.9 kg)     Physical Exam GENERAL: No distress, well nourished. Sitting in wheelchair. SKIN:  No rashes or significant lesions  HEAD: Normocephalic, No masses, lesions, tenderness or abnormalities  EYES: Conjunctiva are pale,  non icteric ENT: External ears normal ,lips , buccal mucosa, and tongue normal and mucous membranes are moist  LYMPH: No palpable cervical and axillary lymphadenopathy  LUNGS: Clear to auscultation, no crackles or wheezes HEART: Regular rate & rhythm, no murmurs, no gallops, S1 normal and S2 normal  ABDOMEN: Abdomen is distended, with normal bowel sounds MUSCULOSKELETAL: lumbar spine tenderness. EXTREMITIES: No edema, no skin discoloration or tenderness NEURO: Alert & oriented, lower extremity strength 3/5 bilaterllay. Normal upper extremity strength.    LABORATORY DATA: I have personally reviewed the data as listed: CBC    Component Value Date/Time   WBC 12.5 (H) 05/04/2017 0454   RBC 4.17 (L) 05/04/2017 0454   HGB 11.5 (L) 05/04/2017 0454   HGB 15.3 11/15/2012 1641   HCT 34.7 (L) 05/04/2017 0454   HCT 43.7 11/15/2012 1641   PLT 147 (L) 05/04/2017 0454   PLT 166 11/15/2012 1641   MCV 83.2  05/04/2017 0454   MCV 89 11/15/2012 1641   MCH 27.7 05/04/2017 0454   MCHC 33.2 05/04/2017 0454   RDW 15.2 (H) 05/04/2017 0454   RDW 13.3 11/15/2012 1641   LYMPHSABS 1.8 12/14/2015 1222   LYMPHSABS 1.7 11/15/2012 1641   MONOABS 0.7 12/14/2015 1222   MONOABS 0.8 11/15/2012 1641   EOSABS 0.2 12/14/2015 1222   EOSABS 0.1 11/15/2012 1641   BASOSABS 0.0 12/14/2015 1222   BASOSABS 0.0 11/15/2012 1641   CMP Latest  Ref Rng & Units 05/04/2017 05/03/2017 04/19/2017  Glucose 65 - 99 mg/dL 128(H) 115(H) 104(H)  BUN 6 - 20 mg/dL 15 19 17   Creatinine 0.61 - 1.24 mg/dL 1.20 1.30(H) 1.32(H)  Sodium 135 - 145 mmol/L 134(L) 138 139  Potassium 3.5 - 5.1 mmol/L 3.9 4.4 4.5  Chloride 101 - 111 mmol/L 101 103 100(L)  CO2 22 - 32 mmol/L 25 26 29   Calcium 8.9 - 10.3 mg/dL 8.5(L) 8.8(L) 9.7  Total Protein 6.5 - 8.1 g/dL - - 7.7  Total Bilirubin 0.3 - 1.2 mg/dL - - 0.6  Alkaline Phos 38 - 126 U/L - - 114  AST 15 - 41 U/L - - 21  ALT 17 - 63 U/L - - 18     RADIOGRAPHIC STUDIES: I have personally reviewed the radiological images as listed and agree with the findings in the report CT chest and abdomen 05/03/2017 IMPRESSION: 1. Pneumoperitoneum with a small amount of ascites, gas tracking along the thoracoabdominal wall into the scrotum, and a small amount of gas in the right perirenal space-these findings are likely related to the patient's attempted nephroureterectomy yesterday. Surveillance to ensure expected resolution of the pneumoperitoneum might be appropriate. 2. The right kidney lower pole infarct with lack of patency of the more inferior of the 3 right renal arteries. There is delayed nephrogram on the right with increase an infiltrative process in theright kidney and extending into the right renal hilar vascular structures, including the right renal vein and the adjacent IVC, compatible with tumor thrombus. There is also soft tissue density surrounding the renal hilar vascular structures and  extending into the retroperitoneum and periaortic region, some of which may be tumor and some of which may be hematoma.3. Demineralization of the anterior inferior L2 vertebral body,concerning for tumor invasion. This is near the level of the retroaortic left renal vein. 4. No findings of metastatic disease to the chest. There are new trace bilateral pleural effusions with mild passive atelectasis. 5.  Aortic Atherosclerosis (ICD10-I70.0).  Coronary atherosclerosis. 6. Inflammatory stranding from the right perirenal space extends down into the pelvis and crosses the midline. There is presacral edema. 7. 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 Recommend followup by ultrasound in 3 years. This recommendation follows ACR consensus guidelines: White Paper of the ACR Incidental Findings Committee II on Vascular Findings. J Am Coll Radiol 2013; 96:045-409 8. Wall thickening along the left side of the urinary bladder may be incidental, but synchronous sessile transitional cell carcinoma is not readily excluded.  CT abdomen wo contrast 02/09/2017  Increased size of 4.6 cm ill-defined hypoenhancing mass ininterpolar region of right kidney, with obstruction of upper pole collecting system. This does not have typical appearance for pyelonephritis or renal cell carcinoma, and raises suspicion for urothelial carcinoma. Consider ureteroscopy for further evaluation. No evidence of metastatic disease. Stable mildly enlarged prostate and mild diffuse bladder wall thickening. Stable 3.0 cm infrarenal abdominal aortic aneurysm. Recommend followup by ultrasound in 3 years. This recommendation follows ACR consensus guidelines: White Paper of the ACR Incidental Findings Committee II on Vascular Findings. [place] [phone]. Colonic diverticulosis. No radiographic evidence of diverticulitis. Stable small fat-containing left inguinal hernia. MRI thoracic and lumbar w/wo  contrast.  1. Malignant invasion of the L2 vertebral body by the adjacent retroperitoneal mass. No pathologic fracture or osseous metastatic disease elsewhere in the thoracolumbar spine. 2. Areas of epidural contrast enhancement of the lumbar spine are most consistent with a dilated epidural venous plexus secondary  to invasion of the inferior vena cava by the patient's urothelial tumor. 3. Moderate L4-L5 multifactorial spinal canal stenosis with severe right and mild-to-moderate left neural foraminal stenosis. 4. Moderate right and severe left multifactorial L5-S1 neural foraminal stenosis. There is also severe attenuation of the thecal sac at this level but no bony spinal canal stenosis. 5. No spinal canal or neural foraminal stenosis in the thoracic spine.  Pathology 05/02/2017   SPECIMEN SUBMITTED:  A. Hilar tissue, right kidney  B. Hilar tissue, right kidney  DIAGNOSIS:  A and B. 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   Comment:  One fragment in part A contains an infiltrative pleomorphic neoplasm.  Immunohistochemistry (IHC) was performed for further characterization,  and the neoplastic cells are positive for cytokeratins (AE1/AE3/Cam5.2),  with diffuse strong staining. Scattered cells show weak to moderate  staining for GATA3, which supports the above diagnosis.   The tumor is infiltrating adipose tissue, and no nodal tissue is  identified. Clinical correlation is needed to determine if this  represents metastasis or direct extension from the kidney tumor.   ADDENDUM #1:  PD-L1 - Urothelial (TECENTRIQ) Immunohistochemistry Analysis  Result: 0%  Interpretation: No expression  ASSESSMENT/PLAN Cancer Staging Transitional cell carcinoma of kidney, right [facility] 2) Staging form: Kidney, AJCC 8th Edition - Clinical stage from 05/08/2017: Stage IV (cT3, cNX, cM1) - Signed by Earlie Server, MD on 05/08/2017  1.  Transitional cell carcinoma of kidney, right ([place])   2. 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 Goals of care, counseling/discussion    Pathology and image results were discussed with patient and his families.  He has locally advanced urothelia carcinoma of the kidney.  # PD-L1 expression 0%, not eligible for frontline immunotherapy.  CKD 3 and poor performance status, Cisplatin ineligible.  # Gemcitabine +/- carboplatin as first line chemotherapy option was discussed with patient.   I explained to the patient the risks and benefits of chemotherapy including all but not limited to hair loss, mouth sore, nausea, vomiting, low blood counts, bleeding, and risk of life threatening infection and even death, secondary malignancy etc.Patient and family members voice understanding and is willing to proceed with treatment  # Chemotherapy education; port placement. Hopefully the planned start chemotherapy next week. Antiemetics-Zofran and Compazine; EMLA cream sent to pharmacy   # Refer to RadOnc for evaluation of RT to L2 lesion for symptom relief.  # Xgeva Q28 days for metastatic bone disease.  Follow up 1 week prior to cycle 1 Carboplatin and gemcitabine. Cbc cmp precycle.   Orders Placed This Encounter  Procedures  . CBC with Differential/Platelet    Standing Status:   Future    Standing Expiration Date:   05/17/2018  . Comprehensive metabolic panel    Standing Status:   Future    Standing Expiration Date:   05/17/2018  . Ambulatory referral to Radiation Oncology    Referral Priority:   Routine    Referral Type:   Consultation    Referral Reason:   Specialty Services Required    Referred to Provider:   Noreene Filbert, MD    Requested Specialty:   Radiation Oncology    Number of Visits Requested:   1    All questions were answered. The patient knows to call the clinic with any problems, questions or concerns.    Earlie Server, MD  05/19/2017 12:08 AM

## 2017-05-19 NOTE — Addendum Note (Signed)
Addended by: Earlie Server on: 05/19/2017 03:04 PM   Modules accepted: Orders

## 2017-05-19 NOTE — Progress Notes (Signed)
START ON PATHWAY REGIMEN - Bladder     A cycle is every 21 days:     Carboplatin      Gemcitabine   **Always confirm dose/schedule in your pharmacy ordering system**    Patient Characteristics: Metastatic Disease, First Line, No Prior Neoadjuvant/Adjuvant Therapy AJCC M Category: M1 AJCC N Category: NX AJCC T Category: T3 Current evidence of distant metastases? Yes AJCC 8 Stage Grouping: Unknown Line of therapy: First Line Would you be surprised if this patient died  in the next year? I would be surprised if this patient died in the next year Prior Neoadjuvant/Adjuvant Therapy? No Intent of Therapy: Non-Curative / Palliative Intent, Discussed with Patient

## 2017-05-22 ENCOUNTER — Inpatient Hospital Stay: Payer: Medicare Other

## 2017-05-22 ENCOUNTER — Ambulatory Visit
Admission: RE | Admit: 2017-05-22 | Discharge: 2017-05-22 | Disposition: A | Discharge status: Home / Self Care | Payer: Medicare Other | Source: Ambulatory Visit | Attending: Radiation Oncology | Admitting: Radiation Oncology

## 2017-05-22 ENCOUNTER — Telehealth: Payer: Self-pay | Admitting: *Deleted

## 2017-05-22 ENCOUNTER — Encounter: Payer: Self-pay | Admitting: Radiation Oncology

## 2017-05-22 VITALS — BP 182/104 | HR 79 | Temp 95.8°F | Resp 22

## 2017-05-22 DIAGNOSIS — E785 Hyperlipidemia, unspecified: Secondary | ICD-10-CM | POA: Diagnosis not present

## 2017-05-22 DIAGNOSIS — F329 Major depressive disorder, single episode, unspecified: Secondary | ICD-10-CM | POA: Diagnosis not present

## 2017-05-22 DIAGNOSIS — F1120 Opioid dependence, uncomplicated: Secondary | ICD-10-CM | POA: Insufficient documentation

## 2017-05-22 DIAGNOSIS — I10 Essential (primary) hypertension: Secondary | ICD-10-CM | POA: Insufficient documentation

## 2017-05-22 DIAGNOSIS — C641 Malignant neoplasm of right kidney, except renal pelvis: Secondary | ICD-10-CM

## 2017-05-22 DIAGNOSIS — Z79899 Other long term (current) drug therapy: Secondary | ICD-10-CM | POA: Insufficient documentation

## 2017-05-22 DIAGNOSIS — N400 Enlarged prostate without lower urinary tract symptoms: Secondary | ICD-10-CM | POA: Insufficient documentation

## 2017-05-22 DIAGNOSIS — M5136 Other intervertebral disc degeneration, lumbar region: Secondary | ICD-10-CM | POA: Insufficient documentation

## 2017-05-22 DIAGNOSIS — M549 Dorsalgia, unspecified: Secondary | ICD-10-CM | POA: Insufficient documentation

## 2017-05-22 DIAGNOSIS — F419 Anxiety disorder, unspecified: Secondary | ICD-10-CM | POA: Insufficient documentation

## 2017-05-22 DIAGNOSIS — I509 Heart failure, unspecified: Secondary | ICD-10-CM | POA: Diagnosis not present

## 2017-05-22 DIAGNOSIS — G373 Acute transverse myelitis in demyelinating disease of central nervous system: Secondary | ICD-10-CM | POA: Diagnosis not present

## 2017-05-22 DIAGNOSIS — K219 Gastro-esophageal reflux disease without esophagitis: Secondary | ICD-10-CM | POA: Diagnosis not present

## 2017-05-22 DIAGNOSIS — Z8711 Personal history of peptic ulcer disease: Secondary | ICD-10-CM | POA: Diagnosis not present

## 2017-05-22 DIAGNOSIS — I2510 Atherosclerotic heart disease of native coronary artery without angina pectoris: Secondary | ICD-10-CM | POA: Insufficient documentation

## 2017-05-22 DIAGNOSIS — Z87891 Personal history of nicotine dependence: Secondary | ICD-10-CM | POA: Insufficient documentation

## 2017-05-22 DIAGNOSIS — J449 Chronic obstructive pulmonary disease, unspecified: Secondary | ICD-10-CM | POA: Insufficient documentation

## 2017-05-22 DIAGNOSIS — E039 Hypothyroidism, unspecified: Secondary | ICD-10-CM | POA: Diagnosis not present

## 2017-05-22 DIAGNOSIS — Z7982 Long term (current) use of aspirin: Secondary | ICD-10-CM | POA: Insufficient documentation

## 2017-05-22 DIAGNOSIS — N319 Neuromuscular dysfunction of bladder, unspecified: Secondary | ICD-10-CM | POA: Diagnosis not present

## 2017-05-22 DIAGNOSIS — Z510 Encounter for antineoplastic radiation therapy: Secondary | ICD-10-CM | POA: Insufficient documentation

## 2017-05-22 DIAGNOSIS — C7951 Secondary malignant neoplasm of bone: Secondary | ICD-10-CM | POA: Diagnosis present

## 2017-05-22 DIAGNOSIS — D291 Benign neoplasm of prostate: Secondary | ICD-10-CM | POA: Diagnosis not present

## 2017-05-22 DIAGNOSIS — R531 Weakness: Secondary | ICD-10-CM | POA: Insufficient documentation

## 2017-05-22 MED ORDER — PROCHLORPERAZINE MALEATE 10 MG PO TABS: 10.0000 mg | ORAL_TABLET | Freq: Four times a day (QID) | ORAL | 1 refills | Status: DC | PRN

## 2017-05-22 MED ORDER — ONDANSETRON HCL 8 MG PO TABS: 8.0000 mg | ORAL_TABLET | Freq: Two times a day (BID) | ORAL | 1 refills | Status: DC | PRN

## 2017-05-22 MED ORDER — LIDOCAINE-PRILOCAINE 2.5-2.5 % EX CREA: TOPICAL_CREAM | CUTANEOUS | 3 refills | Status: DC

## 2017-05-22 NOTE — Telephone Encounter (Signed)
-----   Message from Joseph Ritter sent at 05/22/2017  8:47 AM EDT ----- Regarding: nausea Pt here today for an appt and wife said that he needs RX for nausea please...  [place]

## 2017-05-22 NOTE — Addendum Note (Signed)
Addended by: Earlie Server on: 05/22/2017 10:22 AM   Modules accepted: Orders

## 2017-05-22 NOTE — Consult Note (Signed)
NEW PATIENT EVALUATION  Name: Joseph Ritter  MRN: [id number]  Date:   05/22/2017     DOB: May 14, 1941   This 76 y.o. 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  REFERRING PHYSICIAN: [name], MD  CHIEF COMPLAINT:  Chief Complaint  Patient presents with  . Cancer    Pt is here for initial consultation of bone metastasis    DIAGNOSIS: The primary encounter diagnosis was Transitional cell carcinoma of kidney, right ([place]). A diagnosis of Bone metastasis ([place]) was also pertinent to this visit.   PREVIOUS INVESTIGATIONS:  MRI scans and CT scans reviewed Pathology reports reviewed Clinical notes reviewed  HPI: Patient is a 76 year old male who presented with gross hematuria towards the end of 2017. He was found in December 2017 to have a endophytic hypodense mass in his right kidney with biopsy showing transitional cell carcinoma high-grade of the right collecting system. Robotic nephrectomy was attempted but aborted secondary to metastatic disease extending towards the spinal column. Postoperative CT scan demonstrated mass extending from the right kidney along the right renal vein towards the inferior vena cava with encasement of the IVC. 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 He does self catheterize. He's been having significant back pain with inability to ambulate and is on narcotic analgesics for that. CT scan and MRI scan confirm marked destruction of the L2 vertebral body from metastatic disease. He has been seen by medical oncology and is scheduled to start gemcitabine and carboplatinum as first-line chemotherapy. I been asked to evaluate him for radiation therapy to his L2 lesion for symptomatically relief of pain. He is also on X [place] for metastatic bone disease.   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); Borderline diabetes (05/25/2014); BP (high blood pressure) (11/18/2012); BPH (benign prostatic hypertrophy); CAFL (chronic airflow limitation) (H[facility]) (05/03/2015); CCF (congestive cardiac failure) ([place]) (01/03/2014); Clinical depression (05/03/2015); COPD (chronic obstructive pulmonary disease) ([place]); DDD (degenerative disc disease), lumbar (06/29/2014); 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    PAST SURGICAL HISTORY:  Past Surgical History:  Procedure Laterality Date  . ARTERY BIOPSY Right 12/16/2015   Procedure: BIOPSY TEMPORAL ARTERY;  Surgeon: Algernon Huxley, MD;  Location: [facility];  Service: Vascular;  Laterality: Right;  . CARDIAC CATHETERIZATION    . CATARACT EXTRACTION W/PHACO Left 01/26/2016   Procedure: CATARACT EXTRACTION PHACO AND INTRAOCULAR LENS PLACEMENT (IOC) LEFT EYE;  Surgeon: Leandrew Koyanagi, MD;  Location: [place];  Service: Ophthalmology;  Laterality: Left;  . CORONARY ARTERY BYPASS GRAFT  04/07/2002   [facility]  . Cysto Bladder Botox Injection  07/02/2014  . CYSTOSCOPY W/ RETROGRADES Right 03/16/2017   Procedure: CYSTOSCOPY WITH RETROGRADE PYELOGRAM;  Surgeon: [name], MD;  Location: [facility];  Service: Urology;  Laterality:  Right;  . CYSTOSCOPY WITH STENT PLACEMENT Right 03/16/2017   Procedure: CYSTOSCOPY WITH STENT PLACEMENT;  Surgeon: Pilar Jarvis,  Horald Pollen, MD;  Location: [facility];  Service: Urology;  Laterality: Right;  . EYE SURGERY Left    blood clot behind left eye  . HAND SURGERY Bilateral   . HERNIA REPAIR Right    inguinal hernia repair  . LEFT HEART CATH AND CORS/GRAFTS ANGIOGRAPHY N/A 04/25/2017   Procedure: Left Heart Cath and Cors/Grafts Angiography;  Surgeon: Isaias Cowman, MD;  Location: [place] [facility];  Service: Cardiovascular;  Laterality: N/A;  . ROBOT ASSITED LAPAROSCOPIC NEPHROURETERECTOMY Right 05/02/2017   Procedure: ROBOT ASSITED LAPAROSCOPIC NEPHROURETERECTOMY ATTEMPTED;  Surgeon: [name], MD;  Location: [facility];  Service: Urology;  Laterality: Right;  . URETEROSCOPY Right 03/16/2017   Procedure: URETEROSCOPY BIOPSY RENAL MASS;  Surgeon: [name], MD;  Location: [facility];  Service: Urology;  Laterality: Right;    FAMILY HISTORY: family history includes Heart attack in his mother; Heart disease in his father.  SOCIAL HISTORY:  reports that he quit smoking about 15 years ago. His smoking use included Cigarettes. He has quit using smokeless tobacco. His smokeless tobacco use included Snuff and Chew. He reports that he does not drink alcohol or use drugs.  ALLERGIES: Patient has no known allergies.  MEDICATIONS:  Current Outpatient Prescriptions  Medication Sig Dispense Refill  . acetaminophen (TYLENOL) 500 MG tablet Take 500 mg by mouth 2 (two) times daily as needed (for pain.).     Marland Kitchen aspirin EC 81 MG tablet Take 81 mg by mouth daily after breakfast.     . buPROPion (WELLBUTRIN SR) 100 MG 12 hr tablet Take 100 mg by mouth daily after breakfast.    . cyclobenzaprine (FLEXERIL) 10 MG tablet Take 10 mg by mouth at bedtime as needed for muscle spasms.    Marland Kitchen ezetimibe-simvastatin (VYTORIN) 10-40 MG tablet Take 1 tablet by mouth at bedtime.    Marland Kitchen  HYDROcodone-acetaminophen (NORCO) 7.5-325 MG tablet   0  . 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    Marland Kitchen ketoconazole (NIZORAL) 2 % shampoo APPLY SHAMPOO TO HAIR AS NEEDED FOR FLAKY/ITCHY SCALP (TYPICALLY EVERY 2-3 DAYS)    . levothyroxine (SYNTHROID, LEVOTHROID) 100 MCG tablet Take 100 mcg by mouth daily before breakfast.    . lidocaine-prilocaine (EMLA) cream Apply to affected area once 30 g 3  . Methylcellulose, Laxative, (CITRUCEL PO) Take 1 tablet by mouth daily after breakfast.    . Multiple Vitamin (MULTIVITAMIN WITH MINERALS) TABS tablet Take 1 tablet by mouth daily after breakfast. CENTRUM SILVER    . ondansetron (ZOFRAN) 8 MG tablet Take 1 tablet (8 mg total) by mouth 2 (two) times daily as needed for refractory nausea / vomiting. Start on day 3 after carboplatin chemo. 30 tablet 1  . oxyCODONE (OXYCONTIN) 15 mg 12 hr tablet Take 1 tablet (15 mg total) by mouth every 12 (twelve) hours. 28 tablet 0  . PEPPERMINT OIL PO Take 1 capsule by mouth 3 (three) times daily as needed (for IBS). IBgard (active ingredient: 90 mg ultrapurified peppermint oil)    . polyethylene glycol powder (GLYCOLAX/MIRALAX) powder Take 17 g by mouth daily after breakfast. Mix with glass of water 255 g 1  . Probiotic Product (PROBIOTIC PO) Take 1 capsule by mouth daily as needed (for digestive health (see instructions)). AFTER BREAKFAST EITHER TAKE A PROBIOTIC OR EAT YOGURT    . prochlorperazine (COMPAZINE) 10 MG tablet Take 1 tablet (10 mg total)  by mouth every 6 (six) hours as needed (Nausea or vomiting). 30 tablet 1  . RABEprazole (ACIPHEX) 20 MG tablet Take 20 mg by mouth daily before breakfast.     . ramipril (ALTACE) 10 MG capsule Take 10 mg by mouth daily after breakfast.    . senna (SENOKOT) 8.6 MG TABS tablet Take 2 tablets (17.2 mg total) by mouth daily. 120 each 0  . sertraline (ZOLOFT) 100 MG  tablet Take 100 mg by mouth daily after breakfast.    . tamsulosin (FLOMAX) 0.4 MG CAPS capsule Take 0.8 mg by mouth daily after supper.      No current facility-administered medications for this encounter.     ECOG PERFORMANCE STATUS:  2 - Symptomatic, <50% confined to bed  REVIEW OF SYSTEMS: Except for the extreme pain and self-catheterization macro review of systems   PHYSICAL EXAM: BP (!) 182/104   Pulse 79   Temp (!) 95.8 F (35.4 C)   Resp (!) 22  Well-developed patient in moderate pain discomfort. Well-developed well-nourished patient in NAD. HEENT reveals PERLA, EOMI, discs not visualized.  Oral cavity is clear. No oral mucosal lesions are identified. Neck is clear without evidence of cervical or supraclavicular adenopathy. Lungs are clear to A&P. Cardiac examination is essentially unremarkable with regular rate and rhythm without murmur rub or thrill. Abdomen is benign with no organomegaly or masses noted. Motor sensory and DTR levels are equal and symmetric in the upper and lower extremities. Cranial nerves II through XII are grossly intact. Proprioception is intact. No peripheral adenopathy or edema is identified. No motor or sensory levels are noted. Crude visual fields are within normal range.  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 Would plan on delivering 3000 cGy in 10 fractions from L1-L3 inclusive. Risks and benefits of treatment including possible diarrhea fatigue skin reaction alteration of blood counts all were discussed in detail with the patient and his wife. Both seem to comprehend my treatment plan well. I have  ordered CT simulation for tomorrow.  I would like to take this opportunity to thank you for allowing me to participate in the care of your patient.Armstead Peaks., MD

## 2017-05-23 ENCOUNTER — Ambulatory Visit
Admission: RE | Admit: 2017-05-23 | Discharge: 2017-05-23 | Disposition: A | Discharge status: Home / Self Care | Payer: Medicare Other | Source: Ambulatory Visit | Attending: Radiation Oncology | Admitting: Radiation Oncology

## 2017-05-23 ENCOUNTER — Other Ambulatory Visit: Payer: Self-pay | Admitting: Oncology

## 2017-05-23 DIAGNOSIS — C7951 Secondary malignant neoplasm of bone: Secondary | ICD-10-CM | POA: Diagnosis not present

## 2017-05-23 MED ORDER — OXYCODONE HCL ER 40 MG PO T12A: 40.0000 mg | EXTENDED_RELEASE_TABLET | Freq: Two times a day (BID) | ORAL | 0 refills | Status: DC

## 2017-05-23 NOTE — Progress Notes (Signed)
Patient complains that he still have severe pain, current pain medication not helping. Requests more pain medication.  Increase oxycontin to 40mg  BID. Rx provided to patient.

## 2017-05-25 ENCOUNTER — Encounter: Payer: Self-pay | Admitting: Oncology

## 2017-05-25 ENCOUNTER — Emergency Department
Admission: EM | Admit: 2017-05-25 | Discharge: 2017-05-25 | Disposition: A | Discharge status: Home / Self Care | Payer: Medicare Other | Attending: Emergency Medicine | Admitting: Emergency Medicine

## 2017-05-25 ENCOUNTER — Other Ambulatory Visit: Payer: Self-pay

## 2017-05-25 ENCOUNTER — Other Ambulatory Visit: Payer: Self-pay | Admitting: *Deleted

## 2017-05-25 ENCOUNTER — Telehealth (INDEPENDENT_AMBULATORY_CARE_PROVIDER_SITE_OTHER): Payer: Self-pay

## 2017-05-25 ENCOUNTER — Inpatient Hospital Stay: Payer: Medicare Other

## 2017-05-25 ENCOUNTER — Encounter: Payer: Self-pay | Admitting: Emergency Medicine

## 2017-05-25 ENCOUNTER — Inpatient Hospital Stay (HOSPITAL_BASED_OUTPATIENT_CLINIC_OR_DEPARTMENT_OTHER): Payer: Medicare Other | Admitting: Oncology

## 2017-05-25 VITALS — BP 186/72 | HR 85 | Temp 95.9°F | Wt 159.0 lb

## 2017-05-25 VITALS — BP 187/98 | HR 90

## 2017-05-25 DIAGNOSIS — R3914 Feeling of incomplete bladder emptying: Secondary | ICD-10-CM | POA: Diagnosis not present

## 2017-05-25 DIAGNOSIS — N183 Chronic kidney disease, stage 3 unspecified: Secondary | ICD-10-CM

## 2017-05-25 DIAGNOSIS — C641 Malignant neoplasm of right kidney, except renal pelvis: Secondary | ICD-10-CM

## 2017-05-25 DIAGNOSIS — J449 Chronic obstructive pulmonary disease, unspecified: Secondary | ICD-10-CM

## 2017-05-25 DIAGNOSIS — N319 Neuromuscular dysfunction of bladder, unspecified: Secondary | ICD-10-CM

## 2017-05-25 DIAGNOSIS — C651 Malignant neoplasm of right renal pelvis: Secondary | ICD-10-CM | POA: Diagnosis not present

## 2017-05-25 DIAGNOSIS — R262 Difficulty in walking, not elsewhere classified: Secondary | ICD-10-CM

## 2017-05-25 DIAGNOSIS — N3281 Overactive bladder: Secondary | ICD-10-CM | POA: Diagnosis not present

## 2017-05-25 DIAGNOSIS — R140 Abdominal distension (gaseous): Secondary | ICD-10-CM | POA: Diagnosis not present

## 2017-05-25 DIAGNOSIS — R531 Weakness: Secondary | ICD-10-CM

## 2017-05-25 DIAGNOSIS — C7951 Secondary malignant neoplasm of bone: Secondary | ICD-10-CM

## 2017-05-25 DIAGNOSIS — N400 Enlarged prostate without lower urinary tract symptoms: Secondary | ICD-10-CM

## 2017-05-25 DIAGNOSIS — R7303 Prediabetes: Secondary | ICD-10-CM

## 2017-05-25 DIAGNOSIS — I25810 Atherosclerosis of coronary artery bypass graft(s) without angina pectoris: Secondary | ICD-10-CM | POA: Diagnosis not present

## 2017-05-25 DIAGNOSIS — I714 Abdominal aortic aneurysm, without rupture: Secondary | ICD-10-CM

## 2017-05-25 DIAGNOSIS — M5136 Other intervertebral disc degeneration, lumbar region: Secondary | ICD-10-CM

## 2017-05-25 DIAGNOSIS — C679 Malignant neoplasm of bladder, unspecified: Secondary | ICD-10-CM

## 2017-05-25 DIAGNOSIS — E039 Hypothyroidism, unspecified: Secondary | ICD-10-CM | POA: Diagnosis not present

## 2017-05-25 DIAGNOSIS — R112 Nausea with vomiting, unspecified: Secondary | ICD-10-CM | POA: Diagnosis not present

## 2017-05-25 DIAGNOSIS — R42 Dizziness and giddiness: Secondary | ICD-10-CM | POA: Diagnosis not present

## 2017-05-25 DIAGNOSIS — K5900 Constipation, unspecified: Secondary | ICD-10-CM

## 2017-05-25 DIAGNOSIS — Z87891 Personal history of nicotine dependence: Secondary | ICD-10-CM

## 2017-05-25 DIAGNOSIS — Z5111 Encounter for antineoplastic chemotherapy: Secondary | ICD-10-CM

## 2017-05-25 DIAGNOSIS — G893 Neoplasm related pain (acute) (chronic): Secondary | ICD-10-CM | POA: Insufficient documentation

## 2017-05-25 DIAGNOSIS — R5383 Other fatigue: Secondary | ICD-10-CM

## 2017-05-25 DIAGNOSIS — I130 Hypertensive heart and chronic kidney disease with heart failure and stage 1 through stage 4 chronic kidney disease, or unspecified chronic kidney disease: Secondary | ICD-10-CM | POA: Insufficient documentation

## 2017-05-25 DIAGNOSIS — I700 Atherosclerosis of aorta: Secondary | ICD-10-CM

## 2017-05-25 DIAGNOSIS — Z7189 Other specified counseling: Secondary | ICD-10-CM

## 2017-05-25 DIAGNOSIS — I2510 Atherosclerotic heart disease of native coronary artery without angina pectoris: Secondary | ICD-10-CM

## 2017-05-25 DIAGNOSIS — R630 Anorexia: Secondary | ICD-10-CM | POA: Diagnosis not present

## 2017-05-25 DIAGNOSIS — Z905 Acquired absence of kidney: Secondary | ICD-10-CM | POA: Diagnosis not present

## 2017-05-25 DIAGNOSIS — K4090 Unilateral inguinal hernia, without obstruction or gangrene, not specified as recurrent: Secondary | ICD-10-CM

## 2017-05-25 DIAGNOSIS — M549 Dorsalgia, unspecified: Secondary | ICD-10-CM

## 2017-05-25 DIAGNOSIS — I509 Heart failure, unspecified: Secondary | ICD-10-CM | POA: Diagnosis not present

## 2017-05-25 DIAGNOSIS — F418 Other specified anxiety disorders: Secondary | ICD-10-CM

## 2017-05-25 DIAGNOSIS — Z7982 Long term (current) use of aspirin: Secondary | ICD-10-CM

## 2017-05-25 DIAGNOSIS — Z79899 Other long term (current) drug therapy: Secondary | ICD-10-CM

## 2017-05-25 DIAGNOSIS — E785 Hyperlipidemia, unspecified: Secondary | ICD-10-CM

## 2017-05-25 DIAGNOSIS — N401 Enlarged prostate with lower urinary tract symptoms: Secondary | ICD-10-CM | POA: Diagnosis not present

## 2017-05-25 DIAGNOSIS — Z452 Encounter for adjustment and management of vascular access device: Secondary | ICD-10-CM

## 2017-05-25 DIAGNOSIS — R1110 Vomiting, unspecified: Secondary | ICD-10-CM | POA: Diagnosis present

## 2017-05-25 LAB — COMPREHENSIVE METABOLIC PANEL
ALBUMIN: 4 g/dL (ref 3.5–5.0)
ALBUMIN: 3.8 g/dL (ref 3.5–5.0)
ALT: 45 U/L (ref 17–63)
ALT: 42 U/L (ref 17–63)
AST: 33 U/L (ref 15–41)
AST: 27 U/L (ref 15–41)
Alkaline Phosphatase: 147 U/L — ABNORMAL HIGH (ref 38–126)
Alkaline Phosphatase: 136 U/L — ABNORMAL HIGH (ref 38–126)
Anion gap: 11 (ref 5–15)
Anion gap: 12 (ref 5–15)
BUN: 17 mg/dL (ref 6–20)
BUN: 17 mg/dL (ref 6–20)
CHLORIDE: 102 mmol/L (ref 101–111)
CHLORIDE: 102 mmol/L (ref 101–111)
CO2: 27 mmol/L (ref 22–32)
CO2: 25 mmol/L (ref 22–32)
Calcium: 9.9 mg/dL (ref 8.9–10.3)
Calcium: 9.4 mg/dL (ref 8.9–10.3)
Creatinine, Ser: 1.56 mg/dL — ABNORMAL HIGH (ref 0.61–1.24)
Creatinine, Ser: 1.33 mg/dL — ABNORMAL HIGH (ref 0.61–1.24)
GFR calc Af Amer: 49 mL/min — ABNORMAL LOW (ref >60)
GFR calc Af Amer: 59 mL/min — ABNORMAL LOW (ref >60)
GFR calc non Af Amer: 42 mL/min — ABNORMAL LOW (ref >60)
GFR calc non Af Amer: 51 mL/min — ABNORMAL LOW (ref >60)
Glucose, Bld: 157 mg/dL — ABNORMAL HIGH (ref 65–99)
Glucose, Bld: 183 mg/dL — ABNORMAL HIGH (ref 65–99)
POTASSIUM: 3.9 mmol/L (ref 3.5–5.1)
Potassium: 5.1 mmol/L (ref 3.5–5.1)
SODIUM: 140 mmol/L (ref 135–145)
SODIUM: 139 mmol/L (ref 135–145)
Total Bilirubin: 0.6 mg/dL (ref 0.3–1.2)
Total Bilirubin: 0.7 mg/dL (ref 0.3–1.2)
Total Protein: 8 g/dL (ref 6.5–8.1)
Total Protein: 7.4 g/dL (ref 6.5–8.1)

## 2017-05-25 LAB — CBC WITH DIFFERENTIAL/PLATELET
BASOS ABS: 0 10*3/uL (ref 0–0.1)
Basophils Relative: 0 %
EOS ABS: 0.2 10*3/uL (ref 0–0.7)
EOS PCT: 2 %
HCT: 37.4 % — ABNORMAL LOW (ref 40.0–52.0)
Hemoglobin: 12.8 g/dL — ABNORMAL LOW (ref 13.0–18.0)
LYMPHS PCT: 17 %
Lymphs Abs: 1.7 10*3/uL (ref 1.0–3.6)
MCH: 28.5 pg (ref 26.0–34.0)
MCHC: 34.3 g/dL (ref 32.0–36.0)
MCV: 83 fL (ref 80.0–100.0)
Monocytes Absolute: 0.6 10*3/uL (ref 0.2–1.0)
Monocytes Relative: 6 %
Neutro Abs: 7.1 10*3/uL — ABNORMAL HIGH (ref 1.4–6.5)
Neutrophils Relative %: 75 %
PLATELETS: 209 10*3/uL (ref 150–440)
RBC: 4.5 MIL/uL (ref 4.40–5.90)
RDW: 15.1 % — ABNORMAL HIGH (ref 11.5–14.5)
WBC: 9.6 10*3/uL (ref 3.8–10.6)

## 2017-05-25 LAB — URINALYSIS, COMPLETE (UACMP) WITH MICROSCOPIC
BACTERIA UA: NONE SEEN
BILIRUBIN URINE: NEGATIVE
Glucose, UA: NEGATIVE mg/dL
KETONES UR: 5 mg/dL — AB
Nitrite: NEGATIVE
Protein, ur: 100 mg/dL — AB
Specific Gravity, Urine: 1.029 (ref 1.005–1.030)
Squamous Epithelial / LPF: NONE SEEN
pH: 5 (ref 5.0–8.0)

## 2017-05-25 LAB — CBC
HEMATOCRIT: 37.8 % — AB (ref 40.0–52.0)
Hemoglobin: 12.6 g/dL — ABNORMAL LOW (ref 13.0–18.0)
MCH: 28 pg (ref 26.0–34.0)
MCHC: 33.3 g/dL (ref 32.0–36.0)
MCV: 84 fL (ref 80.0–100.0)
Platelets: 192 10*3/uL (ref 150–440)
RBC: 4.5 MIL/uL (ref 4.40–5.90)
RDW: 15.1 % — AB (ref 11.5–14.5)
WBC: 8.2 10*3/uL (ref 3.8–10.6)

## 2017-05-25 LAB — LIPASE, BLOOD: LIPASE: 15 U/L (ref 11–51)

## 2017-05-25 LAB — TROPONIN I

## 2017-05-25 MED ORDER — SODIUM CHLORIDE 0.9 % IV SOLN
1000.0000 mL | Freq: Once | INTRAVENOUS | Status: AC
  Administered 2017-05-25: 1000 mL via INTRAVENOUS

## 2017-05-25 MED ORDER — SODIUM CHLORIDE 0.9 % IV SOLN
Freq: Once | INTRAVENOUS | Status: AC
  Administered 2017-05-25: 11:00:00 via INTRAVENOUS
  Filled 2017-05-25: qty 4

## 2017-05-25 MED ORDER — METHYLPREDNISOLONE SODIUM SUCC 40 MG IJ SOLR
40.0000 mg | Freq: Once | INTRAMUSCULAR | Status: DC
  Filled 2017-05-25: qty 1

## 2017-05-25 MED ORDER — DENOSUMAB 120 MG/1.7ML ~~LOC~~ SOLN: 120.0000 mg | Freq: Once | SUBCUTANEOUS | Status: DC

## 2017-05-25 MED ORDER — HYDROMORPHONE HCL 1 MG/ML IJ SOLN
3.0000 mg | Freq: Once | INTRAMUSCULAR | Status: AC
Start: 2017-05-25 — End: 2017-05-25
  Administered 2017-05-25: 3 mg via INTRAVENOUS
  Filled 2017-05-25: qty 3

## 2017-05-25 MED ORDER — SODIUM CHLORIDE 0.9 % IV SOLN
INTRAVENOUS | Status: DC
  Administered 2017-05-25: 11:00:00 via INTRAVENOUS
  Filled 2017-05-25: qty 1000

## 2017-05-25 MED ORDER — DEXTROSE 5 % IV SOLN
2.0000 g | Freq: Once | INTRAVENOUS | Status: AC
  Administered 2017-05-25: 2 g via INTRAVENOUS
  Filled 2017-05-25: qty 2

## 2017-05-25 MED ORDER — HYDROCODONE-ACETAMINOPHEN 5-325 MG PO TABS
2.0000 | ORAL_TABLET | Freq: Once | ORAL | Status: AC
  Administered 2017-05-25: 2 via ORAL
  Filled 2017-05-25 (×2): qty 2

## 2017-05-25 MED ORDER — LORAZEPAM 0.5 MG PO TABS: 0.5000 mg | ORAL_TABLET | Freq: Four times a day (QID) | ORAL | 0 refills | Status: DC | PRN

## 2017-05-25 NOTE — Progress Notes (Signed)
1130 pt became more diaphoretic after pain medication and antiemetic medication, bp elevated, denies chest pain, sats 95%, pt vomiting continuously since arrival to the infusion area, MD notified and pt transferred to ER

## 2017-05-25 NOTE — ED Provider Notes (Signed)
[facility] Emergency Department Provider Note       Time seen: ----------------------------------------- 12:15 PM on 05/25/2017 -----------------------------------------     I have reviewed the triage vital signs and the nursing notes.   HISTORY   Chief Complaint Emesis    HPI Joseph Ritter is a 76 y.o. male who presents to the ED for vomiting. Patient was sent from the cancer center for vomiting and pain. He was supposed to start chemotherapy today but he was having severe pain so he was sent to the ER for further evaluation after he received 3 mg of Dilaudid and 4 mg of Zofran and subsequently felt dizzy. Patient has chronic low back pain and is currently on radiation.   Past Medical History:  Diagnosis Date  . Acid indigestion 03/09/2014  . Adult hypothyroidism 11/18/2012  . Anxiety   . Arteriosclerosis of coronary artery 11/18/2012   Overview:  PATENT LIMA TO LAD, PATENT SVG TO PDA AND OCCULUDED SVG TO OM2 BY CATHERIZATION 02/09/2009   . 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 Borderline diabetes 05/25/2014  . BP (high blood pressure) 11/18/2012  . BPH (benign prostatic hypertrophy)   . CAFL (chronic airflow limitation) ([place]) 05/03/2015  . CCF (congestive cardiac failure) ([place]) 01/03/2014   Overview:  HX OF   . Clinical depression 05/03/2015  . COPD (chronic obstructive pulmonary disease) ([place])   . DDD (degenerative disc disease), lumbar 06/29/2014  . 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 Gastric ulcer 03/09/2014  . 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 History of urinary self-catheterization 2017  . HLD (hyperlipidemia) 01/03/2014  . Incomplete bladder emptying 11/18/2012  . Leg weakness 11/18/2012  . Lumbar radiculitis   . Neuritis or radiculitis due to rupture of lumbar intervertebral disc 06/29/2014  .  Overactive bladder   . PONV (postoperative nausea and vomiting)    years ago with Ether, no problem with Nausea or vomiting with the last few surgerys  . Pre-diabetes   . Subacute transverse myelitis ([place]) 11/21/2012  . Teeth problem    pt reports "bad teeth", "need to be pulled"    Patient Active Problem List   Diagnosis Date Noted  . Metastatic transitional cell carcinoma to bone ([place]) 05/19/2017  . CKD (chronic kidney disease) stage 3, GFR 30-59 ml/min 05/08/2017  . Transitional cell carcinoma of kidney, right ([place]) 05/02/2017  . Right renal mass 02/27/2017  . Gross hematuria 08/15/2016  . Dysuria 08/15/2016  . Acute cystitis with hematuria 08/15/2016  . CAFL (chronic airflow limitation) ([place]) 05/03/2015  . Clinical depression 05/03/2015  . 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 DDD (degenerative disc disease), lumbar 06/29/2014  . Neuritis or radiculitis due to rupture of lumbar intervertebral disc 06/29/2014  . 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 Borderline diabetes 05/25/2014  . Blood glucose elevated 05/25/2014  . Acid indigestion 03/09/2014  . Gastric ulcer 03/09/2014  . CCF (congestive cardiac failure) ([place]) 01/03/2014  . HLD (hyperlipidemia) 01/03/2014  . 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 Subacute transverse myelitis ([place]) 11/21/2012  . 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 Arteriosclerosis of coronary artery 11/18/2012  . BP (high blood pressure) 11/18/2012  . Adult hypothyroidism 11/18/2012  . Incomplete bladder emptying 11/18/2012  . Leg weakness 11/18/2012  . H/O coronary artery bypass surgery 04/07/2002    Past Surgical History:  Procedure Laterality Date  .  ARTERY BIOPSY Right 12/16/2015   Procedure: BIOPSY TEMPORAL ARTERY;  Surgeon: Algernon Huxley, MD;  Location: [facility];  Service: Vascular;  Laterality: Right;  . CARDIAC CATHETERIZATION    . CATARACT EXTRACTION W/PHACO Left 01/26/2016   Procedure: CATARACT EXTRACTION PHACO AND INTRAOCULAR LENS  PLACEMENT (IOC) LEFT EYE;  Surgeon: Leandrew Koyanagi, MD;  Location: [place];  Service: Ophthalmology;  Laterality: Left;  . CORONARY ARTERY BYPASS GRAFT  04/07/2002   [facility]  . Cysto Bladder Botox Injection  07/02/2014  . CYSTOSCOPY W/ RETROGRADES Right 03/16/2017   Procedure: CYSTOSCOPY WITH RETROGRADE PYELOGRAM;  Surgeon: [name], MD;  Location: [facility];  Service: Urology;  Laterality: Right;  . CYSTOSCOPY WITH STENT PLACEMENT Right 03/16/2017   Procedure: CYSTOSCOPY WITH STENT PLACEMENT;  Surgeon: [name], MD;  Location: [facility];  Service: Urology;  Laterality: Right;  . EYE SURGERY Left    blood clot behind left eye  . HAND SURGERY Bilateral   . HERNIA REPAIR Right    inguinal hernia repair  . LEFT HEART CATH AND CORS/GRAFTS ANGIOGRAPHY N/A 04/25/2017   Procedure: Left Heart Cath and Cors/Grafts Angiography;  Surgeon: Isaias Cowman, MD;  Location: [place] [facility];  Service: Cardiovascular;  Laterality: N/A;  . ROBOT ASSITED LAPAROSCOPIC NEPHROURETERECTOMY Right 05/02/2017   Procedure: ROBOT ASSITED LAPAROSCOPIC NEPHROURETERECTOMY ATTEMPTED;  Surgeon: [name], MD;  Location: [facility];  Service: Urology;  Laterality: Right;  . URETEROSCOPY Right 03/16/2017   Procedure: URETEROSCOPY BIOPSY RENAL MASS;  Surgeon: [name], MD;  Location: [facility];  Service: Urology;  Laterality: Right;    Allergies Patient has no known allergies.  Social History Social History  Substance Use Topics  . Smoking status: Former Smoker    Types: Cigarettes    Quit date: 04/07/2002  . Smokeless tobacco: Former Systems developer    Types: Snuff, Chew  . Alcohol use No    Review of Systems Constitutional: Negative for fever. Cardiovascular: Negative for chest pain. Respiratory: Negative for shortness of breath. Gastrointestinal: Negative for abdominal pain,Positive for vomiting Genitourinary: Negative for dysuria. Musculoskeletal: Positive for back  pain Skin: Negative for rash. Neurological: Negative for headaches, focal weakness or numbness.  All systems negative/normal/unremarkable except as stated in the HPI  ____________________________________________   PHYSICAL EXAM:  VITAL SIGNS: ED Triage Vitals  Enc Vitals Group     BP 05/25/17 1154 (!) 164/94     Pulse Rate 05/25/17 1154 77     Resp 05/25/17 1154 20     Temp 05/25/17 1154 98.7 F (37.1 C)     Temp Source 05/25/17 1154 Oral     SpO2 05/25/17 1154 97 %     Weight 05/25/17 1151 159 lb (72.1 kg)     Height 05/25/17 1151 5\' 6"  (1.676 m)     Head Circumference --      Peak Flow --      Pain Score --      Pain Loc --      Pain Edu? --      Excl. in [place]? --    Constitutional: Alert and oriented. No distress Eyes: Conjunctivae are normal. Normal extraocular movements. ENT   Head: Normocephalic and atraumatic.   Nose: No congestion/rhinnorhea.   Mouth/Throat: Mucous membranes are moist.   Neck: No stridor. Cardiovascular: Normal rate, regular rhythm. No murmurs, rubs, or gallops. Respiratory: Normal respiratory effort without tachypnea nor retractions. Breath sounds are clear and equal bilaterally. No wheezes/rales/rhonchi. Gastrointestinal: Nonfocal abdominal tenderness, radiation  markers are present on the abdominal wall Musculoskeletal: Nontender with normal range of motion in extremities. No lower extremity tenderness nor edema. Neurologic:  Normal speech and language. No gross focal neurologic deficits are appreciated.  Skin:  Skin is warm, dry and intact. No rash noted. Psychiatric: Mood and affect are normal. Speech and behavior are normal.  ____________________________________________  EKG: Interpreted by me.Sinus rhythm rate 83 bpm, normal PR, normal QRS, normal QT.  ____________________________________________  ED COURSE:  Pertinent labs & imaging results that were available during my care of the patient were reviewed by me and considered  in my medical decision making (see chart for details). Patient presents for dizziness after medications received at the cancer center., we will assess with labs and imaging as indicated.   Procedures ____________________________________________   LABS (pertinent positives/negatives)  Labs Reviewed  COMPREHENSIVE METABOLIC PANEL - Abnormal; Notable for the following:       Result Value   Glucose, Bld 183 (*)    Creatinine, Ser 1.33 (*)    Alkaline Phosphatase 136 (*)    GFR calc non Af Amer 51 (*)    GFR calc Af Amer 59 (*)    All other components within normal limits  CBC - Abnormal; Notable for the following:    Hemoglobin 12.6 (*)    HCT 37.8 (*)    RDW 15.1 (*)    All other components within normal limits  URINALYSIS, COMPLETE (UACMP) WITH MICROSCOPIC - Abnormal; Notable for the following:    Color, Urine AMBER (*)    APPearance CLOUDY (*)    Hgb urine dipstick MODERATE (*)    Ketones, ur 5 (*)    Protein, ur 100 (*)    Leukocytes, UA LARGE (*)    All other components within normal limits  URINE CULTURE  LIPASE, BLOOD  TROPONIN I   ____________________________________________  FINAL ASSESSMENT AND PLAN  Dizziness  Plan: Patient's labs and imaging were dictated above. Patient had presented for Dizziness of uncertain etiology. She does have transitional cell carcinoma and his urine is difficult to discern for infection. We have sent a urine culture and given IV antibiotics. I did discuss with his oncologist who agrees with plan. I think the issue today was him receiving 3 mg of IV Dilaudid. Currently he is stable for discharge.   Earleen Newport, MD   Note: This note was generated in part or whole with voice recognition software. Voice recognition is usually quite accurate but there are transcription errors that can and very often do occur. I apologize for any typographical errors that were not detected and corrected.     Earleen Newport, MD 05/25/17  [phone]

## 2017-05-25 NOTE — Telephone Encounter (Signed)
Attempted to contact the patient to schedule his port and was unable to make contact or leave a message.

## 2017-05-25 NOTE — Progress Notes (Signed)
Patient here today for follow up.   

## 2017-05-25 NOTE — ED Triage Notes (Addendum)
Pt from cancer center for vomiting. Was supposed to start chemo today but was having severe pain and vomiting. Pt received 3 mg dilaudid and 4 mg zofran at cancer center.  Dizzy but this started after pain meds. Pt vomiting in triage. Has chronic lower back pain.

## 2017-05-25 NOTE — Progress Notes (Signed)
[facility] Initial Visit:  Patient Care Team: [name], MD as PCP - General (Physician Assistant)  CHIEF COMPLAINTS/PURPOSE OF CONSULTATION: Locally advanced kidney urothelia cancer  HISTORY OF PRESENTING ILLNESS: Joseph Ritter 76 y.o. male with PMH listed as below  is referred to [place] for evaluation of locally advanced kidney urothelia cancer. Patient developed gross hematuria at the end of 2017. CT 08/2016 showed endophytic hypodense mass. Repeat image 01/2017 showed enlarged  endophytic hypodense mass right kidney. 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 Right robotic nephroureterectomy was attempted but aborted due to unresectable newly metastatic disease diagnosed intraoperatively. Postop he underwent a CT scan of the abdomen and pelvis which showed extension of an infiltrative mass from the right kidney along the right renal vein toward the IVC with encasement of the IVC.  Today patient is accompanied by his wife, sister and brother in law. 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 He self catheterize a few time a day.  He reports having back pain which limits her ability to walk. Also constipated and abdominal bloating. His appetite is fair. He takes Norco for abdominal and back pain.  He can walk with walker for very short distance, mostly limited by back pain and fatigue./weakness.   INTERVAL HISTORY Patient presents to discuss about the image results. He complains severe back pain. He has evaluated by RadOnc and planned for simulation and palliative RT.  He reports that his pain has not been controlled with Oxcycontin 37m BID and PRN Norco. Oxcycontin was increased to 469mBID. Patient has the prescription and per patient's family, they were told that pharmacy told them that they will contact [place] authorization. They have not heard any update. It appears that pharmacy  never contacted [place] patient did not get increased dose of OxyContin. Today Patient complains uncontrolled pain, mainly on his back. He also feels nauseous, has not been eating well. Denies fever chills, cough, abdominal pain. His blood pressure is high in the clinic. With further questioning, he has not taking his blood pressure medication. We gave him a couple of water and he took blood pressure medication in the clinic.   Review of Systems  Constitutional: Positive for appetite change and fatigue.  HENT:  Negative.   Eyes: Negative.   Respiratory: Negative.   Cardiovascular: Negative.   Gastrointestinal: Positive for abdominal distention and constipation.  Endocrine: Negative.   Genitourinary: Positive for difficulty urinating.   Musculoskeletal: Positive for back pain.  Skin: Negative.     MEDICAL HISTORY: Past Medical History:  Diagnosis Date  . Acid indigestion 03/09/2014  . Adult hypothyroidism 11/18/2012  . Anxiety   . Arteriosclerosis of coronary artery 11/18/2012   Overview:  PATENT LIMA TO LAD, PATENT SVG TO PDA AND OCCULUDED SVG TO OM2 BY CATHERIZATION 02/09/2009   . 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 Borderline diabetes 05/25/2014  . BP (high blood pressure) 11/18/2012  . BPH (benign prostatic hypertrophy)   . CAFL (chronic airflow limitation) ([place]8/04/2015  . CCF (congestive cardiac failure) ([facility]4/07/2014   Overview:  HX OF   . Clinical depression 05/03/2015  . COPD (chronic obstructive pulmonary disease) ([place]  . DDD (degenerative disc disease), lumbar 06/29/2014  . 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 Gastric ulcer 03/09/2014  . H/O coronary artery bypass surgery 04/07/2002   Overview:  CABG  X3 WITH LIMA TO LAD, SVG OM1 AND PDA   . History of urinary self-catheterization 2017  . HLD (hyperlipidemia) 01/03/2014  . Incomplete bladder emptying 11/18/2012  . Leg weakness 11/18/2012  . Lumbar  radiculitis   . Neuritis or radiculitis due to rupture of lumbar intervertebral disc 06/29/2014  . Overactive bladder   . PONV (postoperative nausea and vomiting)    years ago with Ether, no problem with Nausea or vomiting with the last few surgerys  . Pre-diabetes   . Subacute transverse myelitis ([place]) 11/21/2012  . Teeth problem    pt reports "bad teeth", "need to be pulled"    SURGICAL HISTORY: Past Surgical History:  Procedure Laterality Date  . ARTERY BIOPSY Right 12/16/2015   Procedure: BIOPSY TEMPORAL ARTERY;  Surgeon: Algernon Huxley, MD;  Location: [facility];  Service: Vascular;  Laterality: Right;  . CARDIAC CATHETERIZATION    . CATARACT EXTRACTION W/PHACO Left 01/26/2016   Procedure: CATARACT EXTRACTION PHACO AND INTRAOCULAR LENS PLACEMENT (IOC) LEFT EYE;  Surgeon: Leandrew Koyanagi, MD;  Location: [place];  Service: Ophthalmology;  Laterality: Left;  . CORONARY ARTERY BYPASS GRAFT  04/07/2002   [facility]  . Cysto Bladder Botox Injection  07/02/2014  . CYSTOSCOPY W/ RETROGRADES Right 03/16/2017   Procedure: CYSTOSCOPY WITH RETROGRADE PYELOGRAM;  Surgeon: [name], MD;  Location: [facility];  Service: Urology;  Laterality: Right;  . CYSTOSCOPY WITH STENT PLACEMENT Right 03/16/2017   Procedure: CYSTOSCOPY WITH STENT PLACEMENT;  Surgeon: [name], MD;  Location: [facility];  Service: Urology;  Laterality: Right;  . EYE SURGERY Left    blood clot behind left eye  . HAND SURGERY Bilateral   . HERNIA REPAIR Right    inguinal hernia repair  . LEFT HEART CATH AND CORS/GRAFTS ANGIOGRAPHY N/A 04/25/2017   Procedure: Left Heart Cath and Cors/Grafts Angiography;  Surgeon: Isaias Cowman, MD;  Location: [place] [facility];  Service: Cardiovascular;  Laterality: N/A;  . ROBOT ASSITED LAPAROSCOPIC NEPHROURETERECTOMY Right 05/02/2017   Procedure: ROBOT ASSITED LAPAROSCOPIC NEPHROURETERECTOMY ATTEMPTED;  Surgeon: [name], MD;  Location: [facility];  Service:  Urology;  Laterality: Right;  . URETEROSCOPY Right 03/16/2017   Procedure: URETEROSCOPY BIOPSY RENAL MASS;  Surgeon: [name], MD;  Location: [facility];  Service: Urology;  Laterality: Right;    SOCIAL HISTORY: Social History   Social History  . Marital status: Married    Spouse name: N/A  . Number of children: N/A  . Years of education: N/A   Occupational History  . Not on file.   Social History Main Topics  . Smoking status: Former Smoker    Types: Cigarettes    Quit date: 04/07/2002  . Smokeless tobacco: Former Systems developer    Types: Snuff, Chew  . Alcohol use No  . Drug use: No  . Sexual activity: Not on file   Other Topics Concern  . Not on file   Social History Narrative  . No narrative on file    FAMILY HISTORY Family History  Problem Relation Age of Onset  . Heart attack Mother   . Heart disease Father   . Prostate cancer Neg Hx   . Bladder Cancer Neg Hx   . Kidney cancer Neg Hx     ALLERGIES:  has No Known Allergies.  MEDICATIONS:  No current facility-administered medications for this visit.    Current Outpatient Prescriptions  Medication Sig Dispense Refill  . acetaminophen (TYLENOL) 500 MG tablet Take 500 mg by  mouth 2 (two) times daily as needed (for pain.).     Marland Kitchen aspirin EC 81 MG tablet Take 81 mg by mouth daily after breakfast.     . buPROPion (WELLBUTRIN SR) 100 MG 12 hr tablet Take 100 mg by mouth daily after breakfast.    . cyclobenzaprine (FLEXERIL) 10 MG tablet Take 10 mg by mouth at bedtime as needed for muscle spasms.    Marland Kitchen ezetimibe-simvastatin (VYTORIN) 10-40 MG tablet Take 1 tablet by mouth at bedtime.    Marland Kitchen HYDROcodone-acetaminophen (NORCO) 7.5-325 MG tablet   0  . 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    Marland Kitchen ketoconazole (NIZORAL) 2 % shampoo APPLY SHAMPOO TO HAIR AS NEEDED FOR FLAKY/ITCHY SCALP (TYPICALLY EVERY 2-3  DAYS)    . levothyroxine (SYNTHROID, LEVOTHROID) 100 MCG tablet Take 100 mcg by mouth daily before breakfast.    . Methylcellulose, Laxative, (CITRUCEL PO) Take 1 tablet by mouth daily after breakfast.    . Multiple Vitamin (MULTIVITAMIN WITH MINERALS) TABS tablet Take 1 tablet by mouth daily after breakfast. CENTRUM SILVER    . ondansetron (ZOFRAN) 8 MG tablet Take 1 tablet (8 mg total) by mouth 2 (two) times daily as needed for refractory nausea / vomiting. Start on day 3 after carboplatin chemo. 30 tablet 1  . PEPPERMINT OIL PO Take 1 capsule by mouth 3 (three) times daily as needed (for IBS). IBgard (active ingredient: 90 mg ultrapurified peppermint oil)    . polyethylene glycol powder (GLYCOLAX/MIRALAX) powder Take 17 g by mouth daily after breakfast. Mix with glass of water 255 g 1  . Probiotic Product (PROBIOTIC PO) Take 1 capsule by mouth daily as needed (for digestive health (see instructions)). AFTER BREAKFAST EITHER TAKE A PROBIOTIC OR EAT YOGURT    . prochlorperazine (COMPAZINE) 10 MG tablet Take 1 tablet (10 mg total) by mouth every 6 (six) hours as needed (Nausea or vomiting). 30 tablet 1  . RABEprazole (ACIPHEX) 20 MG tablet Take 20 mg by mouth daily before breakfast.     . ramipril (ALTACE) 10 MG capsule Take 10 mg by mouth daily after breakfast.    . senna (SENOKOT) 8.6 MG TABS tablet Take 2 tablets (17.2 mg total) by mouth daily. 120 each 0  . sertraline (ZOLOFT) 100 MG tablet Take 100 mg by mouth daily after breakfast.    . tamsulosin (FLOMAX) 0.4 MG CAPS capsule Take 0.8 mg by mouth daily after supper.     . lidocaine-prilocaine (EMLA) cream Apply to affected area once (Patient not taking: Reported on 05/25/2017) 30 g 3  . LORazepam (ATIVAN) 0.5 MG tablet Take 1 tablet (0.5 mg total) by mouth every 6 (six) hours as needed (Nausea or vomiting). 30 tablet 0  . oxyCODONE (OXYCONTIN) 40 mg 12 hr tablet Take 1 tablet (40 mg total) by mouth every 12 (twelve) hours. (Patient not taking:  Reported on 05/25/2017) 28 tablet 0   Facility-Administered Medications Ordered in Other Visits  Medication Dose Route Frequency Provider Last Rate Last Dose  . 0.9 %  sodium chloride infusion   Intravenous Continuous Earlie Server, MD   Stopped at 05/25/17 1130  . denosumab (XGEVA) injection 120 mg  120 mg Subcutaneous Once Earlie Server, MD      . methylPREDNISolone sodium succinate (SOLU-MEDROL) 40 mg/mL injection 40 mg  40 mg Intravenous Once Earlie Server, MD  PHYSICAL EXAMINATION:  ECOG PERFORMANCE STATUS: 2 - Symptomatic, <50% confined to bed   Vitals:   05/25/17 0909  BP: (!) 186/72  Pulse: 85  Temp: (!) 95.9 F (35.5 C)    Filed Weights   05/25/17 0909  Weight: 159 lb (72.1 kg)     Physical Exam GENERAL: No distress, well nourished. Sitting in wheelchair. SKIN:  No rashes or significant lesions  HEAD: Normocephalic, No masses, lesions, tenderness or abnormalities  EYES: Conjunctiva are pale,  non icteric ENT: External ears normal ,lips , buccal mucosa, and tongue normal and mucous membranes are moist  LYMPH: No palpable cervical and axillary lymphadenopathy  LUNGS: Clear to auscultation, no crackles or wheezes HEART: Regular rate & rhythm, no murmurs, no gallops, S1 normal and S2 normal  ABDOMEN: Abdomen is distended, with normal bowel sounds MUSCULOSKELETAL: lumbar spine tenderness. EXTREMITIES: No edema, no skin discoloration or tenderness NEURO: Alert & oriented, lower extremity strength 3/5 bilaterllay. Normal upper extremity strength.    LABORATORY DATA: I have personally reviewed the data as listed: CBC    Component Value Date/Time   WBC 8.2 05/25/2017 1205   RBC 4.50 05/25/2017 1205   HGB 12.6 (L) 05/25/2017 1205   HGB 15.3 11/15/2012 1641   HCT 37.8 (L) 05/25/2017 1205   HCT 43.7 11/15/2012 1641   PLT 192 05/25/2017 1205   PLT 166 11/15/2012 1641   MCV 84.0 05/25/2017 1205   MCV 89 11/15/2012 1641   MCH 28.0 05/25/2017 1205   MCHC 33.3 05/25/2017 1205    RDW 15.1 (H) 05/25/2017 1205   RDW 13.3 11/15/2012 1641   LYMPHSABS 1.7 05/25/2017 0808   LYMPHSABS 1.7 11/15/2012 1641   MONOABS 0.6 05/25/2017 0808   MONOABS 0.8 11/15/2012 1641   EOSABS 0.2 05/25/2017 0808   EOSABS 0.1 11/15/2012 1641   BASOSABS 0.0 05/25/2017 0808   BASOSABS 0.0 11/15/2012 1641   CMP Latest Ref Rng & Units 05/25/2017 05/25/2017 05/04/2017  Glucose 65 - 99 mg/dL 183(H) 157(H) 128(H)  BUN 6 - 20 mg/dL 17 17 15   Creatinine 0.61 - 1.24 mg/dL 1.33(H) 1.56(H) 1.20  Sodium 135 - 145 mmol/L 139 140 134(L)  Potassium 3.5 - 5.1 mmol/L 3.9 5.1 3.9  Chloride 101 - 111 mmol/L 102 102 101  CO2 22 - 32 mmol/L 25 27 25   Calcium 8.9 - 10.3 mg/dL 9.4 9.9 8.5(L)  Total Protein 6.5 - 8.1 g/dL 7.4 8.0 -  Total Bilirubin 0.3 - 1.2 mg/dL 0.7 0.6 -  Alkaline Phos 38 - 126 U/L 136(H) 147(H) -  AST 15 - 41 U/L 27 33 -  ALT 17 - 63 U/L 42 45 -     RADIOGRAPHIC STUDIES: I have personally reviewed the radiological images as listed and agree with the findings in the report CT chest and abdomen 05/03/2017 IMPRESSION: 1. Pneumoperitoneum with a small amount of ascites, gas tracking along the thoracoabdominal wall into the scrotum, and a small amount of gas in the right perirenal space-these findings are likely related to the patient's attempted nephroureterectomy yesterday. Surveillance to ensure expected resolution of the pneumoperitoneum might be appropriate. 2. The right kidney lower pole infarct with lack of patency of the more inferior of the 3 right renal arteries. There is delayed nephrogram on the right with increase an infiltrative process in theright kidney and extending into the right renal hilar vascular structures, including the right renal vein and the adjacent IVC, compatible with tumor thrombus. There is also soft tissue density surrounding the renal  hilar vascular structures and extending into the retroperitoneum and periaortic region, some of which may be tumor and some of  which may be hematoma.3. Demineralization of the anterior inferior L2 vertebral body,concerning for tumor invasion. This is near the level of the retroaortic left renal vein. 4. No findings of metastatic disease to the chest. There are new trace bilateral pleural effusions with mild passive atelectasis. 5.  Aortic Atherosclerosis (ICD10-I70.0).  Coronary atherosclerosis. 6. Inflammatory stranding from the right perirenal space extends down into the pelvis and crosses the midline. There is presacral edema. 7. 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 Recommend followup by ultrasound in 3 years. This recommendation follows ACR consensus guidelines: White Paper of the ACR Incidental Findings Committee II on Vascular Findings. J Am Coll Radiol 2013; 54:270-623 8. Wall thickening along the left side of the urinary bladder may be incidental, but synchronous sessile transitional cell carcinoma is not readily excluded.  CT abdomen wo contrast 02/09/2017  Increased size of 4.6 cm ill-defined hypoenhancing mass ininterpolar region of right kidney, with obstruction of upper pole collecting system. This does not have typical appearance for pyelonephritis or renal cell carcinoma, and raises suspicion for urothelial carcinoma. Consider ureteroscopy for further evaluation. No evidence of metastatic disease. Stable mildly enlarged prostate and mild diffuse bladder wall thickening. Stable 3.0 cm infrarenal abdominal aortic aneurysm. Recommend followup by ultrasound in 3 years. This recommendation follows ACR consensus guidelines: White Paper of the ACR Incidental Findings Committee II on Vascular Findings. [place] [id number]. Colonic diverticulosis. No radiographic evidence of diverticulitis. Stable small fat-containing left inguinal hernia. MRI thoracic and lumbar w/wo contrast.  1. Malignant invasion of the L2 vertebral body by the adjacent retroperitoneal mass. No  pathologic fracture or osseous metastatic disease elsewhere in the thoracolumbar spine. 2. Areas of epidural contrast enhancement of the lumbar spine are most consistent with a dilated epidural venous plexus secondary to invasion of the inferior vena cava by the patient's urothelial tumor. 3. Moderate L4-L5 multifactorial spinal canal stenosis with severe right and mild-to-moderate left neural foraminal stenosis. 4. Moderate right and severe left multifactorial L5-S1 neural foraminal stenosis. There is also severe attenuation of the thecal sac at this level but no bony spinal canal stenosis. 5. No spinal canal or neural foraminal stenosis in the thoracic spine.  Pathology 05/02/2017   SPECIMEN SUBMITTED:  A. Hilar tissue, right kidney  B. Hilar tissue, right kidney  DIAGNOSIS:  A and B. 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   Comment:  One fragment in part A contains an infiltrative pleomorphic neoplasm.  Immunohistochemistry (IHC) was performed for further characterization,  and the neoplastic cells are positive for cytokeratins (AE1/AE3/Cam5.2),  with diffuse strong staining. Scattered cells show weak to moderate  staining for GATA3, which supports the above diagnosis.   The tumor is infiltrating adipose tissue, and no nodal tissue is  identified. Clinical correlation is needed to determine if this  represents metastasis or direct extension from the kidney tumor.   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 Cancer Staging Transitional cell carcinoma of kidney, right [facility]) Staging form: Kidney, AJCC 8th Edition - Clinical stage from 05/08/2017: Stage IV (cT3, cNX, cM1) -  Signed by Earlie Server, MD on 05/08/2017  1. Intractable vomiting with nausea, unspecified vomiting type   2. Transitional cell carcinoma of kidney, right ([place])   3. 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 Goals of care, counseling/discussion   5. CKD (chronic kidney disease) stage 3, GFR 30-59 ml/min   6. Encounter for antineoplastic chemotherapy   7. Encounter for care related to Port-a-Cath    1 Pathology and image results were discussed with patient and his families.  He has locally advanced urothelia carcinoma of the kidney.  # PD-L1 expression 0%, not eligible for frontline immunotherapy. Plan Gemcitabine and Carboplain. Patient has been to chemotherapy class.   2 Uncontrolled pain: Contact the pharmacy and was told that his OxyContin 40 mg twice a day it to be authorized. We will try to see if we can give him a 2 week supply for now. I discussed with patient and his family that he needs to communicate with [place] if he does not get medication   3 Nausea: Not related to chemotherapy as he has not started yet. He has used Zofran at home. As he has uncontrolled pain today, persistent nausea, I hold his cycle 1 chemotherapy treatment today. Instead I gave him 1 dose of IV Dilaudid for pain control, IV Zofran, and 1 dose of Solu-Medrol to help to palliate pain. I was called by infusion center nurse that patient has intractable nausea and vomiting, not improved by treatment, also becoming diaphoretic, I recommend patient to be sent to emergency room for further evaluation.  # RadOnc for RT to L2 lesion for symptom relief.  # Xgeva Q28 days for metastatic bone disease.  # Refer to vascular surgeon for medi port placement.   Follow up 1.week to re-evaluate prior to chemotherapy.   Orders Placed This Encounter  Procedures  . Ambulatory referral to Vascular Surgery    Referral Priority:   Routine    Referral Type:   Surgical    Referral Reason:   Specialty Services Required     Referred to Provider:   Algernon Huxley, MD    Requested Specialty:   Vascular Surgery    Number of Visits Requested:   1   All questions were answered. The patient knows to call the clinic with any problems, questions or concerns.    Earlie Server, MD  05/25/2017 1:25 PM

## 2017-05-26 ENCOUNTER — Telehealth: Payer: Self-pay | Admitting: Oncology

## 2017-05-26 NOTE — Telephone Encounter (Signed)
Patient' wife called answering service that pharmacy was closed yesterday after patient discharged from ER. Did not fill Oxycontin Rx. Asking for advice. Last night patient's pain is not controlled as he only took norco. Advice to pick up her Rx today (pharmacy will open again in 25 minutes) and try Oxycontin. Advice to call me again if pain still not controlled or can not get medicine. Wife voices understanding.

## 2017-05-27 LAB — URINE CULTURE
Culture: 60000 — AB
SPECIAL REQUESTS: NORMAL

## 2017-05-28 ENCOUNTER — Emergency Department
Admission: EM | Admit: 2017-05-28 | Discharge: 2017-05-28 | Disposition: A | Discharge status: Home / Self Care | Payer: Medicare Other | Attending: Emergency Medicine | Admitting: Emergency Medicine

## 2017-05-28 ENCOUNTER — Encounter: Payer: Self-pay | Admitting: Emergency Medicine

## 2017-05-28 DIAGNOSIS — I2510 Atherosclerotic heart disease of native coronary artery without angina pectoris: Secondary | ICD-10-CM | POA: Diagnosis not present

## 2017-05-28 DIAGNOSIS — J449 Chronic obstructive pulmonary disease, unspecified: Secondary | ICD-10-CM | POA: Diagnosis not present

## 2017-05-28 DIAGNOSIS — F17210 Nicotine dependence, cigarettes, uncomplicated: Secondary | ICD-10-CM | POA: Insufficient documentation

## 2017-05-28 DIAGNOSIS — N183 Chronic kidney disease, stage 3 (moderate): Secondary | ICD-10-CM | POA: Insufficient documentation

## 2017-05-28 DIAGNOSIS — Z7982 Long term (current) use of aspirin: Secondary | ICD-10-CM | POA: Diagnosis not present

## 2017-05-28 DIAGNOSIS — R7303 Prediabetes: Secondary | ICD-10-CM | POA: Insufficient documentation

## 2017-05-28 DIAGNOSIS — Z79899 Other long term (current) drug therapy: Secondary | ICD-10-CM | POA: Insufficient documentation

## 2017-05-28 DIAGNOSIS — Z951 Presence of aortocoronary bypass graft: Secondary | ICD-10-CM | POA: Insufficient documentation

## 2017-05-28 DIAGNOSIS — C7951 Secondary malignant neoplasm of bone: Secondary | ICD-10-CM | POA: Insufficient documentation

## 2017-05-28 DIAGNOSIS — M549 Dorsalgia, unspecified: Secondary | ICD-10-CM | POA: Insufficient documentation

## 2017-05-28 DIAGNOSIS — R1110 Vomiting, unspecified: Secondary | ICD-10-CM | POA: Diagnosis present

## 2017-05-28 DIAGNOSIS — C641 Malignant neoplasm of right kidney, except renal pelvis: Secondary | ICD-10-CM | POA: Diagnosis not present

## 2017-05-28 DIAGNOSIS — E039 Hypothyroidism, unspecified: Secondary | ICD-10-CM | POA: Diagnosis not present

## 2017-05-28 LAB — COMPREHENSIVE METABOLIC PANEL
ALBUMIN: 3.7 g/dL (ref 3.5–5.0)
ALT: 40 U/L (ref 17–63)
AST: 31 U/L (ref 15–41)
Alkaline Phosphatase: 141 U/L — ABNORMAL HIGH (ref 38–126)
Anion gap: 11 (ref 5–15)
BUN: 13 mg/dL (ref 6–20)
CHLORIDE: 99 mmol/L — AB (ref 101–111)
CO2: 26 mmol/L (ref 22–32)
Calcium: 9.5 mg/dL (ref 8.9–10.3)
Creatinine, Ser: 1.17 mg/dL (ref 0.61–1.24)
GFR calc Af Amer: >60 mL/min (ref >60)
GFR, EST NON AFRICAN AMERICAN: 60 mL/min — AB (ref >60)
GLUCOSE: 141 mg/dL — AB (ref 65–99)
Potassium: 3.7 mmol/L (ref 3.5–5.1)
Sodium: 136 mmol/L (ref 135–145)
Total Bilirubin: 0.8 mg/dL (ref 0.3–1.2)
Total Protein: 7.6 g/dL (ref 6.5–8.1)

## 2017-05-28 LAB — CBC
HEMATOCRIT: 39.6 % — AB (ref 40.0–52.0)
HEMOGLOBIN: 13.5 g/dL (ref 13.0–18.0)
MCH: 28.4 pg (ref 26.0–34.0)
MCHC: 34 g/dL (ref 32.0–36.0)
MCV: 83.7 fL (ref 80.0–100.0)
Platelets: 206 10*3/uL (ref 150–440)
RBC: 4.73 MIL/uL (ref 4.40–5.90)
RDW: 14.9 % — AB (ref 11.5–14.5)
WBC: 8.4 10*3/uL (ref 3.8–10.6)

## 2017-05-28 LAB — URINALYSIS, COMPLETE (UACMP) WITH MICROSCOPIC
BILIRUBIN URINE: NEGATIVE
Glucose, UA: NEGATIVE mg/dL
KETONES UR: 5 mg/dL — AB
Nitrite: NEGATIVE
PROTEIN: NEGATIVE mg/dL
SPECIFIC GRAVITY, URINE: 1.009 (ref 1.005–1.030)
SQUAMOUS EPITHELIAL / LPF: NONE SEEN
pH: 7 (ref 5.0–8.0)

## 2017-05-28 LAB — LIPASE, BLOOD: LIPASE: 21 U/L (ref 11–51)

## 2017-05-28 MED ORDER — HYDROMORPHONE HCL 1 MG/ML IJ SOLN
1.0000 mg | Freq: Once | INTRAMUSCULAR | Status: AC
  Administered 2017-05-28: 1 mg via INTRAVENOUS
  Filled 2017-05-28: qty 1

## 2017-05-28 MED ORDER — LIDOCAINE 5 % EX PTCH
1.0000 | MEDICATED_PATCH | CUTANEOUS | Status: DC
  Administered 2017-05-28: 1 via TRANSDERMAL
  Filled 2017-05-28: qty 1

## 2017-05-28 MED ORDER — LIDOCAINE 5 % EX PTCH: 1.0000 | MEDICATED_PATCH | Freq: Two times a day (BID) | CUTANEOUS | 0 refills | Status: DC

## 2017-05-28 MED ORDER — SODIUM CHLORIDE 0.9 % IV BOLUS (SEPSIS)
1000.0000 mL | Freq: Once | INTRAVENOUS | Status: AC
  Administered 2017-05-28: 1000 mL via INTRAVENOUS

## 2017-05-28 MED ORDER — ONDANSETRON HCL 4 MG/2ML IJ SOLN
4.0000 mg | Freq: Once | INTRAMUSCULAR | Status: AC
  Administered 2017-05-28: 4 mg via INTRAVENOUS
  Filled 2017-05-28: qty 2

## 2017-05-28 NOTE — ED Triage Notes (Signed)
Arrives with c/o continued pain, N/V.  Recent diagnosis of spine and kidney CA.  Seen through ED on Friday for pain.  Patietn also called by ED today and were informed that urine culture came back and antibiotic needed to be changed.  Patient's wife states that they have not picked up new RX yet.

## 2017-05-28 NOTE — ED Notes (Signed)
Pt resting comfortably at this time. Per wife, pt has "not rested this well in weeks."

## 2017-05-28 NOTE — ED Notes (Signed)
ED Provider at bedside. 

## 2017-05-28 NOTE — ED Provider Notes (Signed)
[facility] Emergency Department Provider Note  Time seen: 3:40 PM  I have reviewed the triage vital signs and the nursing notes.   HISTORY  Chief Complaint Emesis and Back Pain    HPI Joseph Ritter is a 76 y.o. male With multiple medical issues including recently diagnosed spinal/renal cancer scheduled to start chemotherapy soon presents the emergency department for increased pain and nausea and vomiting. According to the patient for the past one month or so he has been experiencing significant back pain especially in the right lower back. This is thought to be due to the patient's cancer. He is currently on hydrocodone as well as oxycodone. Wife states the patient has not been able to control his pain over the weekend and has once again become very nauseated but lots of vomiting. Denies any fever. No diarrhea. Patient was told by their oncologist if he is vomiting and cannot keep down pain medication to come to the emergency department for evaluation.  Past Medical History:  Diagnosis Date  . Acid indigestion 03/09/2014  . Adult hypothyroidism 11/18/2012  . Anxiety   . Arteriosclerosis of coronary artery 11/18/2012   Overview:  PATENT LIMA TO LAD, PATENT SVG TO PDA AND OCCULUDED SVG TO OM2 BY CATHERIZATION 02/09/2009   . 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 Borderline diabetes 05/25/2014  . BP (high blood pressure) 11/18/2012  . BPH (benign prostatic hypertrophy)   . CAFL (chronic airflow limitation) ([place]) 05/03/2015  . CCF (congestive cardiac failure) ([place]) 01/03/2014   Overview:  HX OF   . Clinical depression 05/03/2015  . COPD (chronic obstructive pulmonary disease) ([place])   . DDD (degenerative disc disease), lumbar 06/29/2014  . 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 Gastric ulcer 03/09/2014  . 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 History of urinary self-catheterization 2017  . HLD (hyperlipidemia) 01/03/2014  . Incomplete bladder emptying 11/18/2012  . Leg weakness 11/18/2012  . Lumbar radiculitis   . Neuritis or radiculitis due to rupture of lumbar intervertebral disc 06/29/2014  . Overactive bladder   . PONV (postoperative nausea and vomiting)    years ago with Ether, no problem with Nausea or vomiting with the last few surgerys  . Pre-diabetes   . Subacute transverse myelitis ([place]) 11/21/2012  . Teeth problem    pt reports "bad teeth", "need to be pulled"    Patient Active Problem List   Diagnosis Date Noted  . Goals of care, counseling/discussion 05/25/2017  . 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 CKD (chronic kidney disease) stage 3, GFR 30-59 ml/min 05/08/2017  . 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 Right renal mass 02/27/2017  . Gross hematuria 08/15/2016  . Dysuria 08/15/2016  . Acute cystitis with hematuria 08/15/2016  . CAFL (chronic airflow limitation) ([place]) 05/03/2015  . Clinical depression 05/03/2015  . 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 DDD (degenerative disc disease), lumbar 06/29/2014  . Neuritis or radiculitis due to rupture of lumbar intervertebral disc 06/29/2014  . 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 Borderline diabetes 05/25/2014  . Blood glucose elevated 05/25/2014  . Acid indigestion 03/09/2014  . Gastric ulcer 03/09/2014  . CCF (congestive cardiac failure) ([place]) 01/03/2014  . HLD (hyperlipidemia) 01/03/2014  . 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 Subacute transverse myelitis ([place]) 11/21/2012  . Benign fibroma  of prostate 11/18/2012  . Arteriosclerosis of coronary artery 11/18/2012  . BP (high blood pressure) 11/18/2012  . Adult hypothyroidism 11/18/2012  . Incomplete bladder emptying 11/18/2012  . Leg weakness 11/18/2012  . H/O coronary artery bypass surgery 04/07/2002    Past Surgical History:   Procedure Laterality Date  . ARTERY BIOPSY Right 12/16/2015   Procedure: BIOPSY TEMPORAL ARTERY;  Surgeon: Algernon Huxley, MD;  Location: [facility];  Service: Vascular;  Laterality: Right;  . CARDIAC CATHETERIZATION    . CATARACT EXTRACTION W/PHACO Left 01/26/2016   Procedure: CATARACT EXTRACTION PHACO AND INTRAOCULAR LENS PLACEMENT (IOC) LEFT EYE;  Surgeon: Leandrew Koyanagi, MD;  Location: [place];  Service: Ophthalmology;  Laterality: Left;  . CORONARY ARTERY BYPASS GRAFT  04/07/2002   [facility]  . Cysto Bladder Botox Injection  07/02/2014  . CYSTOSCOPY W/ RETROGRADES Right 03/16/2017   Procedure: CYSTOSCOPY WITH RETROGRADE PYELOGRAM;  Surgeon: [name], MD;  Location: [facility];  Service: Urology;  Laterality: Right;  . CYSTOSCOPY WITH STENT PLACEMENT Right 03/16/2017   Procedure: CYSTOSCOPY WITH STENT PLACEMENT;  Surgeon: [name], MD;  Location: [facility];  Service: Urology;  Laterality: Right;  . EYE SURGERY Left    blood clot behind left eye  . HAND SURGERY Bilateral   . HERNIA REPAIR Right    inguinal hernia repair  . LEFT HEART CATH AND CORS/GRAFTS ANGIOGRAPHY N/A 04/25/2017   Procedure: Left Heart Cath and Cors/Grafts Angiography;  Surgeon: Isaias Cowman, MD;  Location: [place];  Service: Cardiovascular;  Laterality: N/A;  . ROBOT ASSITED LAPAROSCOPIC NEPHROURETERECTOMY Right 05/02/2017   Procedure: ROBOT ASSITED LAPAROSCOPIC NEPHROURETERECTOMY ATTEMPTED;  Surgeon: [name], MD;  Location: [facility];  Service: Urology;  Laterality: Right;  . URETEROSCOPY Right 03/16/2017   Procedure: URETEROSCOPY BIOPSY RENAL MASS;  Surgeon: [name], MD;  Location: [facility];  Service: Urology;  Laterality: Right;    Prior to Admission medications   Medication Sig Start Date End Date Taking? Authorizing Provider  acetaminophen (TYLENOL) 500 MG tablet Take 500 mg by mouth 2 (two) times daily as needed (for pain.).     [provider]  aspirin EC 81 MG tablet Take 81 mg by mouth daily after breakfast.     [provider]  buPROPion (WELLBUTRIN SR) 100 MG 12 hr tablet Take 100 mg by mouth daily after breakfast.    [provider]  cyclobenzaprine (FLEXERIL) 10 MG tablet Take 10 mg by mouth at bedtime as needed for muscle spasms.    [provider]  ezetimibe-simvastatin (VYTORIN) 10-40 MG tablet Take 1 tablet by mouth at bedtime.    [provider]  HYDROcodone-acetaminophen [facility]) 7.5-325 MG tablet  05/08/17   [provider]  hyoscyamine (ANASPAZ) 0.125 MG TBDP disintergrating tablet Place 0.125 mg under the tongue every 6 (six) hours as needed (for abdominal cramping due to IBS).    [provider]  ketoconazole (NIZORAL) 2 % shampoo APPLY SHAMPOO TO HAIR AS NEEDED FOR FLAKY/ITCHY SCALP (TYPICALLY EVERY 2-3 DAYS) 03/01/15   [provider]  levothyroxine (SYNTHROID, LEVOTHROID) 100 MCG tablet Take 100 mcg by mouth daily before breakfast.    [provider]  lidocaine-prilocaine (EMLA) cream Apply to affected area once 05/22/17   Earlie Server, MD  LORazepam (ATIVAN) 0.5 MG tablet Take 1 tablet (0.5 mg total) by mouth every 6 (six) hours as needed (Nausea or vomiting). 05/25/17   Earlie Server, MD  Methylcellulose, Laxative, (CITRUCEL PO)  Take 1 tablet by mouth daily after breakfast.    [provider]  Multiple Vitamin (MULTIVITAMIN WITH MINERALS) TABS tablet Take 1 tablet by mouth daily after breakfast. CENTRUM SILVER    [provider]  ondansetron (ZOFRAN) 8 MG tablet Take 1 tablet (8 mg total) by mouth 2 (two) times daily as needed for refractory nausea / vomiting. Start on day 3 after carboplatin chemo. 05/22/17   Earlie Server, MD  oxyCODONE (OXYCONTIN) 15 mg 12 hr tablet Take 15 mg by mouth every 12 (twelve) hours.    [provider]  oxyCODONE (OXYCONTIN) 40 mg 12 hr tablet Take 1 tablet (40 mg total) by mouth every 12 (twelve)  hours. Patient not taking: Reported on 05/25/2017 05/23/17   Earlie Server, MD  PEPPERMINT OIL PO Take 1 capsule by mouth 3 (three) times daily as needed (for IBS). IBgard (active ingredient: 90 mg ultrapurified peppermint oil)    [provider]  polyethylene glycol powder (GLYCOLAX/MIRALAX) powder Take 17 g by mouth daily after breakfast. Mix with glass of water 05/08/17   Earlie Server, MD  Probiotic Product (PROBIOTIC PO) Take 1 capsule by mouth daily as needed (for digestive health (see instructions)). AFTER BREAKFAST EITHER TAKE A PROBIOTIC OR EAT YOGURT    [provider]  prochlorperazine (COMPAZINE) 10 MG tablet Take 1 tablet (10 mg total) by mouth every 6 (six) hours as needed (Nausea or vomiting). 05/22/17   Earlie Server, MD  RABEprazole (ACIPHEX) 20 MG tablet Take 20 mg by mouth daily before breakfast.  03/08/15   [provider]  ramipril (ALTACE) 10 MG capsule Take 10 mg by mouth daily after breakfast.    [provider]  senna (SENOKOT) 8.6 MG TABS tablet Take 2 tablets (17.2 mg total) by mouth daily. 05/08/17   Earlie Server, MD  sertraline (ZOLOFT) 100 MG tablet Take 100 mg by mouth daily after breakfast.    [provider]  tamsulosin (FLOMAX) 0.4 MG CAPS capsule Take 0.8 mg by mouth daily after supper.  02/04/14   [provider]    No Known Allergies  Family History  Problem Relation Age of Onset  . Heart attack Mother   . Heart disease Father   . Prostate cancer Neg Hx   . Bladder Cancer Neg Hx   . Kidney cancer Neg Hx     Social History Social History  Substance Use Topics  . Smoking status: Former Smoker    Types: Cigarettes    Quit date: 04/07/2002  . Smokeless tobacco: Former Systems developer    Types: Snuff, Chew  . Alcohol use No    Review of Systems Constitutional: Negative for fever. Cardiovascular: Negative for chest pain. Respiratory: Negative for shortness of breath. Gastrointestinal: Negative for abdominal pain. Positive for  nausea and vomiting. Genitourinary: Negative for dysuria. Musculoskeletal: positive for back pain Neurological: Negative for headache All other ROS negative  ____________________________________________   PHYSICAL EXAM:  VITAL SIGNS: ED Triage Vitals  Enc Vitals Group     BP 05/28/17 1509 (!) 152/113     Pulse Rate 05/28/17 1509 80     Resp 05/28/17 1509 18     Temp 05/28/17 1509 97.7 F (36.5 C)     Temp Source 05/28/17 1509 Oral     SpO2 05/28/17 1509 96 %     Weight 05/28/17 1510 159 lb (72.1 kg)     Height 05/28/17 1510 5\' 6"  (1.676 m)     Head Circumference --  Peak Flow --      Pain Score 05/28/17 1508 7     Pain Loc --      Pain Edu? --      Excl. in [place]? --     Constitutional: Alert and oriented. Well appearing and in no distress. Eyes: Normal exam ENT   Head: Normocephalic and atraumatic.   Mouth/Throat: Mucous membranes are moist. Cardiovascular: Normal rate, regular rhythm. No murmur Respiratory: Normal respiratory effort without tachypnea nor retractions. Breath sounds are clear  Gastrointestinal: Soft and nontender. No distention.  Musculoskeletal: no spinal tenderness to palpation, no bruising or ecchymosis. Patient states the pain feels deeper inside. Neurologic:  Normal speech and language. No gross focal neurologic deficits  Skin:  Skin is warm, dry and intact.  Psychiatric: Mood and affect are normal.   INITIAL IMPRESSION / ASSESSMENT AND PLAN / ED COURSE  Pertinent labs & imaging results that were available during my care of the patient were reviewed by me and considered in my medical decision making (see chart for details).  patient presents for worsening acute on chronic back pain thought to be due to metastatic cancer. We will treat pain and nausea in the emergency department, IV hydrate and check labs.  labs are largely within normal limits. Patient's pain is much improved after Dilaudid. We will add Lidoderm patches to the patient's  pain regimen and have him follow-up with his oncologist tomorrow. Patient agreeable to plan.  ____________________________________________   FINAL CLINICAL IMPRESSION(S) / ED DIAGNOSES  back pain    Harvest Dark, MD 05/28/17 [place]

## 2017-05-28 NOTE — Discharge Instructions (Signed)
please follow-up with your oncologist tomorrow to discuss her pain regimen. Return to the emergency department for any significant increase in pain, or any other symptom personally concerning to yourself.

## 2017-05-28 NOTE — Progress Notes (Addendum)
ED Culture Result  Indication: Urine Cx>>Enterococcus Faecalis   No Known Allergies  Patient Measurements: Height: 5\' 6"  (167.6 cm) Weight: 159 lb (72.1 kg) IBW/kg (Calculated) : 63.8  Vital Signs: Temp: 97.7 F (36.5 C) (09/03 1509) Temp Source: Oral (09/03 1509) BP: 152/113 (09/03 1509) Pulse Rate: 80 (09/03 1509) Intake/Output from previous day: No intake/output data recorded. Intake/Output from this shift: No intake/output data recorded.  Labs:  Recent Labs  05/28/17 1513  WBC 8.4  HGB 13.5  HCT 39.6*  PLT 206   Estimated Creatinine Clearance: 44 mL/min (A) (by C-G formula based on SCr of 1.33 mg/dL (H)).   Microbiology: Recent Results (from the past 720 hour(s))  Urine culture     Status: Abnormal   Collection Time: 05/25/17 12:26 PM  Result Value Ref Range Status   Specimen Description URINE, CLEAN CATCH  Final   Special Requests Normal  Final   Culture 60,000 COLONIES/mL ENTEROCOCCUS FAECALIS (A)  Final   Report Status 05/27/2017 FINAL  Final   Organism ID, Bacteria ENTEROCOCCUS FAECALIS (A)  Final      Susceptibility   Enterococcus faecalis - MIC*    AMPICILLIN <=2 SENSITIVE Sensitive     LEVOFLOXACIN 0.5 SENSITIVE Sensitive     NITROFURANTOIN <=16 SENSITIVE Sensitive     VANCOMYCIN 1 SENSITIVE Sensitive     * 60,000 COLONIES/mL ENTEROCOCCUS FAECALIS    Medical History: Past Medical History:  Diagnosis Date  . Acid indigestion 03/09/2014  . Adult hypothyroidism 11/18/2012  . Anxiety   . Arteriosclerosis of coronary artery 11/18/2012   Overview:  PATENT LIMA TO LAD, PATENT SVG TO PDA AND OCCULUDED SVG TO OM2 BY CATHERIZATION 02/09/2009   . 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 Borderline diabetes 05/25/2014  . BP (high blood pressure) 11/18/2012  . BPH (benign prostatic hypertrophy)   . CAFL (chronic airflow limitation) ([place]) 05/03/2015  . CCF (congestive cardiac failure) ([place]) 01/03/2014   Overview:  HX OF   .  Clinical depression 05/03/2015  . COPD (chronic obstructive pulmonary disease) ([place])   . DDD (degenerative disc disease), lumbar 06/29/2014  . 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 Gastric ulcer 03/09/2014  . 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 History of urinary self-catheterization 2017  . HLD (hyperlipidemia) 01/03/2014  . Incomplete bladder emptying 11/18/2012  . Leg weakness 11/18/2012  . Lumbar radiculitis   . Neuritis or radiculitis due to rupture of lumbar intervertebral disc 06/29/2014  . Overactive bladder   . PONV (postoperative nausea and vomiting)    years ago with Ether, no problem with Nausea or vomiting with the last few surgerys  . Pre-diabetes   . Subacute transverse myelitis ([place]) 11/21/2012  . Teeth problem    pt reports "bad teeth", "need to be pulled"    Assessment: 76 year old male who was sent from oncology to the ED due to emesis 8/31. Patient experiencing dizziness and a urine culture was obtained. Urine culture results revealed enterococcus faecalis.    Plan:  With approval from Dr. Reita Cliche, called in amoxicillin 500 mg twice daily for 7 days to patient's preferred pharmacy.   [place] Resident  05/28/2017,3:48 PM

## 2017-05-28 NOTE — ED Notes (Signed)

## 2017-05-29 ENCOUNTER — Other Ambulatory Visit (INDEPENDENT_AMBULATORY_CARE_PROVIDER_SITE_OTHER): Payer: Self-pay

## 2017-05-29 ENCOUNTER — Telehealth: Payer: Self-pay | Admitting: *Deleted

## 2017-05-29 NOTE — Telephone Encounter (Signed)
Called patient again and she states he has a UTI and was started on antibiotics for it, he also went to ER a second time and was given a lidoderm patch

## 2017-05-29 NOTE — Telephone Encounter (Signed)
Noted thanks °

## 2017-05-29 NOTE — Telephone Encounter (Signed)
Called top report that he had to go to the ER yesterday for nausea, vomiting and pain. Requesting a call back, but I get no answer when I tried to call back

## 2017-05-30 ENCOUNTER — Ambulatory Visit
Admission: RE | Admit: 2017-05-30 | Discharge: 2017-05-30 | Disposition: A | Discharge status: Home / Self Care | Payer: Medicare Other | Source: Ambulatory Visit | Attending: Vascular Surgery | Admitting: Vascular Surgery

## 2017-05-30 ENCOUNTER — Encounter
Admission: RE | Disposition: A | Discharge status: Home / Self Care | Payer: Self-pay | Source: Ambulatory Visit | Attending: Vascular Surgery

## 2017-05-30 ENCOUNTER — Ambulatory Visit: Payer: Medicare Other

## 2017-05-30 DIAGNOSIS — Z9842 Cataract extraction status, left eye: Secondary | ICD-10-CM | POA: Insufficient documentation

## 2017-05-30 DIAGNOSIS — Z9889 Other specified postprocedural states: Secondary | ICD-10-CM | POA: Diagnosis not present

## 2017-05-30 DIAGNOSIS — E039 Hypothyroidism, unspecified: Secondary | ICD-10-CM | POA: Diagnosis not present

## 2017-05-30 DIAGNOSIS — F339 Major depressive disorder, recurrent, unspecified: Secondary | ICD-10-CM | POA: Insufficient documentation

## 2017-05-30 DIAGNOSIS — Z8249 Family history of ischemic heart disease and other diseases of the circulatory system: Secondary | ICD-10-CM | POA: Insufficient documentation

## 2017-05-30 DIAGNOSIS — Z87891 Personal history of nicotine dependence: Secondary | ICD-10-CM | POA: Diagnosis not present

## 2017-05-30 DIAGNOSIS — Z7982 Long term (current) use of aspirin: Secondary | ICD-10-CM | POA: Diagnosis not present

## 2017-05-30 DIAGNOSIS — J449 Chronic obstructive pulmonary disease, unspecified: Secondary | ICD-10-CM | POA: Diagnosis not present

## 2017-05-30 DIAGNOSIS — E785 Hyperlipidemia, unspecified: Secondary | ICD-10-CM | POA: Diagnosis not present

## 2017-05-30 DIAGNOSIS — Z8711 Personal history of peptic ulcer disease: Secondary | ICD-10-CM | POA: Insufficient documentation

## 2017-05-30 DIAGNOSIS — N183 Chronic kidney disease, stage 3 (moderate): Secondary | ICD-10-CM | POA: Insufficient documentation

## 2017-05-30 DIAGNOSIS — Z955 Presence of coronary angioplasty implant and graft: Secondary | ICD-10-CM | POA: Insufficient documentation

## 2017-05-30 DIAGNOSIS — Z951 Presence of aortocoronary bypass graft: Secondary | ICD-10-CM | POA: Insufficient documentation

## 2017-05-30 DIAGNOSIS — R7303 Prediabetes: Secondary | ICD-10-CM | POA: Diagnosis not present

## 2017-05-30 DIAGNOSIS — C7951 Secondary malignant neoplasm of bone: Secondary | ICD-10-CM | POA: Diagnosis not present

## 2017-05-30 DIAGNOSIS — C649 Malignant neoplasm of unspecified kidney, except renal pelvis: Secondary | ICD-10-CM

## 2017-05-30 DIAGNOSIS — C641 Malignant neoplasm of right kidney, except renal pelvis: Secondary | ICD-10-CM | POA: Diagnosis present

## 2017-05-30 HISTORY — PX: PORTA CATH INSERTION: CATH118285

## 2017-05-30 SURGERY — PORTA CATH INSERTION
Anesthesia: Moderate Sedation

## 2017-05-30 MED ORDER — CEFAZOLIN SODIUM-DEXTROSE 1-4 GM/50ML-% IV SOLN
1.0000 g | Freq: Once | INTRAVENOUS | Status: AC
  Administered 2017-05-30: 1 g via INTRAVENOUS

## 2017-05-30 MED ORDER — FENTANYL CITRATE (PF) 100 MCG/2ML IJ SOLN
INTRAMUSCULAR | Status: DC | PRN
  Administered 2017-05-30: 50 ug via INTRAVENOUS
  Administered 2017-05-30 (×2): 25 ug via INTRAVENOUS

## 2017-05-30 MED ORDER — HYDROMORPHONE HCL 1 MG/ML IJ SOLN
INTRAMUSCULAR | Status: AC
  Filled 2017-05-30: qty 0.5

## 2017-05-30 MED ORDER — HEPARIN (PORCINE) IN NACL 2-0.9 UNIT/ML-% IJ SOLN
INTRAMUSCULAR | Status: AC
  Filled 2017-05-30: qty 500

## 2017-05-30 MED ORDER — ONDANSETRON HCL 4 MG/2ML IJ SOLN
INTRAMUSCULAR | Status: AC
  Filled 2017-05-30: qty 2

## 2017-05-30 MED ORDER — LIDOCAINE-EPINEPHRINE (PF) 2 %-1:200000 IJ SOLN
INTRAMUSCULAR | Status: AC
  Filled 2017-05-30: qty 20

## 2017-05-30 MED ORDER — MIDAZOLAM HCL 2 MG/2ML IJ SOLN
INTRAMUSCULAR | Status: DC | PRN
  Administered 2017-05-30 (×5): 1 mg via INTRAVENOUS

## 2017-05-30 MED ORDER — ONDANSETRON HCL 4 MG/2ML IJ SOLN
4.0000 mg | INTRAMUSCULAR | Status: DC | PRN
  Administered 2017-05-30: 4 mg via INTRAVENOUS

## 2017-05-30 MED ORDER — METOCLOPRAMIDE HCL 5 MG/ML IJ SOLN
10.0000 mg | Freq: Once | INTRAMUSCULAR | Status: AC
  Administered 2017-05-30: 10 mg via INTRAVENOUS
  Filled 2017-05-30 (×2): qty 2

## 2017-05-30 MED ORDER — HYDROMORPHONE HCL 1 MG/ML IJ SOLN
0.5000 mg | Freq: Once | INTRAMUSCULAR | Status: AC
  Administered 2017-05-30: 0.5 mg via INTRAVENOUS

## 2017-05-30 MED ORDER — MIDAZOLAM HCL 5 MG/5ML IJ SOLN
INTRAMUSCULAR | Status: AC
  Filled 2017-05-30: qty 5

## 2017-05-30 MED ORDER — SODIUM CHLORIDE 0.9 % IV SOLN
INTRAVENOUS | Status: DC
  Administered 2017-05-30: 09:00:00 via INTRAVENOUS

## 2017-05-30 MED ORDER — FENTANYL CITRATE (PF) 100 MCG/2ML IJ SOLN
INTRAMUSCULAR | Status: AC
Start: 2017-05-30 — End: ?
  Filled 2017-05-30: qty 2

## 2017-05-30 SURGICAL SUPPLY — 11 items
CANNULA 5F STIFF (CANNULA) ×3 IMPLANT
COVER PROBE U/S 5X48 (MISCELLANEOUS) ×3 IMPLANT
DERMABOND ADVANCED (GAUZE/BANDAGES/DRESSINGS) ×2
DERMABOND ADVANCED .7 DNX12 (GAUZE/BANDAGES/DRESSINGS) ×1 IMPLANT
DRAPE INCISE IOBAN 66X45 STRL (DRAPES) ×6 IMPLANT
KIT PORT POWER 8FR ISP CVUE (Miscellaneous) ×3 IMPLANT
NEEDLE ENTRY 21GA 7CM ECHOTIP (NEEDLE) ×3 IMPLANT
PACK ANGIOGRAPHY (CUSTOM PROCEDURE TRAY) ×3 IMPLANT
SUT MNCRL AB 4-0 PS2 18 (SUTURE) ×3 IMPLANT
SUTURE VIC 3-0 (SUTURE) ×3 IMPLANT
TOWEL OR 17X26 4PK STRL BLUE (TOWEL DISPOSABLE) ×3 IMPLANT

## 2017-05-30 NOTE — H&P (Signed)
[facility] History & Physical Update  The patient was interviewed and re-examined.  The patient's previous History and Physical has been reviewed and is unchanged.  There is no change in the plan of care. We plan to proceed with the scheduled procedure.  Patient has a note for Dr Tasia Catchings 05/25/2017  Hortencia Pilar, MD  05/30/2017, 10:37 AM

## 2017-05-30 NOTE — Progress Notes (Signed)
Procedure done, pt remained stable throughout entire procedure. Tolerated procedure with vitals stable.will transfer to specials to finish recovery prior to discharge.

## 2017-05-30 NOTE — H&P (Signed)
[facility] History & Physical Update  The patient was interviewed and re-examined.  The patient's previous History and Physical has been reviewed and is unchanged.  There is no change in the plan of care. We plan to proceed with the scheduled procedure.  Hortencia Pilar, MD  05/30/2017, 1:30 PM

## 2017-05-30 NOTE — Progress Notes (Signed)
Patient arriving early today for portacath placement with Dr Delana Meyer. Had been in ER over weekend as well as Monday with pain/along with nausea and vomiting. Here with wife this am, in waiting room with vomiting, placed on bed in specials to get admitted, and spoke with Dr Delana Meyer. Iv placed as well as admitted to specials. Given iv zofran as well as iv reglan per orders. Will have pt rest in area until time for procedure to receive iv fluids.

## 2017-05-30 NOTE — Op Note (Signed)
OPERATIVE NOTE   PROCEDURE: 1. 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  ANESTHESIA: Conscious sedation was administered under my direct supervision by the interventional radiology RN. IV Versed plus fentanyl were utilized. Continuous ECG, pulse oximetry and blood pressure was monitored throughout the entire procedure. Conscious sedation was for a total of 31 minutes.  ESTIMATED BLOOD LOSS: Minimal   FINDING(S): 1.  Patent vein  SPECIMEN(S): None  INDICATIONS:   Joseph Ritter is a 76 y.o. 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  He is having significant pain control issues as well as significant nausea and dehydration. He is also requiring chemotherapy. For these 3 reasons he requires appropriate venous access. Risks and benefits for Infuse-a-Port placement been reviewed all questions answered patient has agreed to proceed.  DESCRIPTION: After obtaining full informed written consent, the patient was brought back to the special procedure suite and placed in the supine position. The patient's right neck and chest wall are prepped and draped in sterile fashion. Appropriate timeout was called.  Ultrasound is placed in a sterile sleeve, ultrasound is utilized to avoid vascular injury as well as secondary to lack of appropriate landmarks. The right internal jugular vein is identified. It is echolucent and homogeneous as well as easily compressible indicating patency. An image is recorded for the permanent record.  Access to the vein with a micropuncture needle is done under direct ultrasound visualization.  1% lidocaine is infiltrated into the soft tissue at the base of the neck as well as on the chest wall.  Under direct ultrasound visualization a micro-needle is inserted into the vein followed by the micro-wire.  Micro-sheath was then advanced and a J wire is inserted without difficulty under fluoroscopic guidance. A small counterincision was created at the wire insertion site. A transverse incision is created 2 fingerbreadths below the scapula and a pocket is fashioned using both blunt and sharp dissection. The pocket is tested for appropriate size with the hub of the Infuse-a-Port. The tunneling device is then used to pull the intravascular portion of the catheter from the pocket to the neck counterincision.  Dilator and peel-away sheath were then inserted over the wire and the wire is removed. Catheter is then advanced into the venous system without difficulty. Peel-away sheath was then removed.  Catheter is then positioned under fluoroscopic guidance at the atrial caval junction. It is then transected connected to the hub and the hope is slipped into the subcutaneous pocket on the chest wall. The hub was then accessed percutaneously and aspirates easily and flushes well and is flushed with 30 cc of heparinized saline. The pocket incision is then closed in layers using interrupted 3-0 Vicryl for the subcutaneous tissues and 4-0 Monocryl subcuticular for skin closure. Dermabond is applied. The neck counterincision was closed with 4-0 Monocryl subcuticular and Dermabond as well.  The patient tolerated the procedure well and there were no immediate complications.  COMPLICATIONS: None  CONDITION: Unchanged  Katha Cabal M.D. [facility] Office: [phone]   05/30/2017, 5:16 PM

## 2017-05-30 NOTE — Discharge Instructions (Signed)
Moderate Conscious Sedation, Adult, Care After °These instructions provide you with information about caring for yourself after your procedure. Your health care provider may also give you more specific instructions. Your treatment has been planned according to current medical practices, but problems sometimes occur. Call your health care provider if you have any problems or questions after your procedure. °What can I expect after the procedure? °After your procedure, it is common: °· To feel sleepy for several hours. °· To feel clumsy and have poor balance for several hours. °· To have poor judgment for several hours. °· To vomit if you eat too soon. ° °Follow these instructions at home: °For at least 24 hours after the procedure: ° °· Do not: °? Participate in activities where you could fall or become injured. °? Drive. °? Use heavy machinery. °? Drink alcohol. °? Take sleeping pills or medicines that cause drowsiness. °? Make important decisions or sign legal documents. °? Take care of children on your own. °· Rest. °Eating and drinking °· Follow the diet recommended by your health care provider. °· If you vomit: °? Drink water, juice, or soup when you can drink without vomiting. °? Make sure you have little or no nausea before eating solid foods. °General instructions °· Have a responsible adult stay with you until you are awake and alert. °· Take over-the-counter and prescription medicines only as told by your health care provider. °· If you smoke, do not smoke without supervision. °· Keep all follow-up visits as told by your health care provider. This is important. °Contact a health care provider if: °· You keep feeling nauseous or you keep vomiting. °· You feel light-headed. °· You develop a rash. °· You have a fever. °Get help right away if: °· You have trouble breathing. °This information is not intended to replace advice given to you by your health care provider. Make sure you discuss any questions you have  with your health care provider. °Document Released: 07/02/2013 Document Revised: 02/14/2016 Document Reviewed: 01/01/2016 °[employer] Interactive Patient Education © 2018 [employer] °Tunneled Catheter Insertion, Care After °Refer to this sheet in the next few weeks. These instructions provide you with information about caring for yourself after your procedure. Your health care provider may also give you more specific instructions. Your treatment has been planned according to current medical practices, but problems sometimes occur. Call your health care provider if you have any problems or questions after your procedure. °What can I expect after the procedure? °After the procedure, it is common to have: °· Some mild redness, swelling, and pain around your catheter site. °· A small amount of blood or clear fluid coming from your incisions. ° °Follow these instructions at home: °Incision care °· Check your incision areas every day for signs of infection. Check for: °? More redness, swelling, or pain. °? More fluid or blood. °? Warmth. °? Pus or a bad smell. °· Follow instructions from your health care provider about how to take care of your incisions. Make sure you: °? Wash your hands with soap and water before you change your bandages (dressings). If soap and water are not available, use hand sanitizer. °? Change your dressings as told by your health care provider. Wash the area around your incisions with a germ-killing (antiseptic) solution when you change your dressing, as told by your health care provider. °? Leave stitches (sutures), skin glue, or adhesive strips in place. These skin closures may need to stay in place for 2 weeks or   longer. If adhesive strip edges start to loosen and curl up, you may trim the loose edges. Do not remove adhesive strips completely unless your health care provider tells you to do that. °Catheter Care ° °· Wash your hands with soap and water before and after caring for your catheter.  If soap and water are not available, use hand sanitizer. °· Keep your catheter site and your dressings clean and dry. °· Apply an antibiotic ointment to your catheter site as told by your health care provider. °· Flush your catheter as told by your health care provider. This helps prevent it from becoming clogged. °· Do not open the caps on the ends of the catheter. °· Do not pull on your catheter. °· If your catheter is in your arm: °? Avoid wearing tight clothes or tight jewelry on your arm that has the catheter. °? Do not sleep with your head on the arm that has the catheter. °? Do not allow your blood pressure to be taken on the arm that has the catheter. °? Do not allow your blood to be drawn from the arm that has the catheter, except through the catheter itself. °Medicines °· Take over-the-counter and prescription medicines only as told by your health care provider. °· If you were prescribed an antibiotic medicine, take it as told by your health care provider. Do not stop taking the antibiotic even if you start to feel better. °Activity °· Return to your normal activities as told by your health care provider. Ask your health care provider what activities are safe for you. °· Do not lift anything that is heavier than 10 lb (4.5 kg) for 3 weeks or as long as told by your health care provider. °Driving °· Do not drive until your health care provider approves. °· Do not drive or operate heavy machinery while taking prescription pain medicine. °General instructions °· Follow your health care provider's specific instructions for the type of catheter that you have. °· Do not take baths, swim, or use a hot tub until your health care provider approves. °· Follow instructions from your health care provider about eating or drinking restrictions. °· Wear compression stockings as told by your health care provider. These stockings help to prevent blood clots and reduce swelling in your legs. °· Keep all follow-up visits as  told by your health care provider. This is important. °Contact a health care provider if: °· You have more fluid or blood coming from your incisions. °· You have more redness, swelling, or pain at your incisions or around the area where your catheter is inserted. °· Your incisions feel warm to the touch. °· You feel unusually weak. °· You feel nauseous. °· Your catheter is not working properly. °· You have blood or fluid draining from your catheter. °· You are unable to flush your catheter. °Get help right away if: °· Your catheter breaks. °· A hole develops in your catheter. °· Your catheter comes loose or gets pulled completely out. If this happens, press on your catheter site firmly with your hand or a clean cloth until you get medical help. °· Your catheter becomes blocked. °· You have swelling in your arm, shoulder, neck, or face. °· You develop chest pain. °· You have difficulty breathing. °· You feel dizzy or light-headed. °· You have pus or a bad smell coming from your incisions. °· You have a fever. °· You develop bleeding from your catheter or your insertion site, and your bleeding does not   stop. °This information is not intended to replace advice given to you by your health care provider. Make sure you discuss any questions you have with your health care provider. °Document Released: 08/28/2012 Document Revised: 05/14/2016 Document Reviewed: 06/07/2015 °[employer] Interactive Patient Education © 2018 [employer] ° °

## 2017-05-30 NOTE — Progress Notes (Signed)
Patient here today for porta cath insertion per Dr Delana Meyer, attached to monitor to follow vitals throughout procedure.

## 2017-05-31 ENCOUNTER — Encounter: Payer: Self-pay | Admitting: Vascular Surgery

## 2017-05-31 ENCOUNTER — Ambulatory Visit
Admission: RE | Admit: 2017-05-31 | Discharge: 2017-05-31 | Disposition: A | Discharge status: Home / Self Care | Payer: Medicare Other | Source: Ambulatory Visit | Attending: Radiation Oncology | Admitting: Radiation Oncology

## 2017-05-31 ENCOUNTER — Other Ambulatory Visit: Payer: Self-pay | Admitting: *Deleted

## 2017-05-31 DIAGNOSIS — C7951 Secondary malignant neoplasm of bone: Secondary | ICD-10-CM | POA: Diagnosis not present

## 2017-05-31 MED ORDER — PROMETHAZINE HCL 25 MG PO TABS: 25.0000 mg | ORAL_TABLET | Freq: Four times a day (QID) | ORAL | 0 refills | Status: DC | PRN

## 2017-06-01 ENCOUNTER — Inpatient Hospital Stay: Payer: Medicare Other

## 2017-06-01 ENCOUNTER — Inpatient Hospital Stay: Payer: Medicare Other | Attending: Oncology | Admitting: Oncology

## 2017-06-01 ENCOUNTER — Ambulatory Visit
Admission: RE | Admit: 2017-06-01 | Discharge: 2017-06-01 | Disposition: A | Discharge status: Home / Self Care | Payer: Medicare Other | Source: Ambulatory Visit | Attending: Radiation Oncology | Admitting: Radiation Oncology

## 2017-06-01 ENCOUNTER — Telehealth: Payer: Self-pay | Admitting: *Deleted

## 2017-06-01 ENCOUNTER — Encounter: Payer: Self-pay | Admitting: Oncology

## 2017-06-01 VITALS — BP 122/81 | HR 80 | Temp 98.1°F | Resp 20 | Wt 158.5 lb

## 2017-06-01 DIAGNOSIS — R7303 Prediabetes: Secondary | ICD-10-CM

## 2017-06-01 DIAGNOSIS — R531 Weakness: Secondary | ICD-10-CM

## 2017-06-01 DIAGNOSIS — N319 Neuromuscular dysfunction of bladder, unspecified: Secondary | ICD-10-CM | POA: Diagnosis not present

## 2017-06-01 DIAGNOSIS — Z792 Long term (current) use of antibiotics: Secondary | ICD-10-CM | POA: Diagnosis not present

## 2017-06-01 DIAGNOSIS — D6959 Other secondary thrombocytopenia: Secondary | ICD-10-CM | POA: Diagnosis not present

## 2017-06-01 DIAGNOSIS — M48061 Spinal stenosis, lumbar region without neurogenic claudication: Secondary | ICD-10-CM

## 2017-06-01 DIAGNOSIS — E871 Hypo-osmolality and hyponatremia: Secondary | ICD-10-CM | POA: Diagnosis not present

## 2017-06-01 DIAGNOSIS — C679 Malignant neoplasm of bladder, unspecified: Secondary | ICD-10-CM | POA: Diagnosis not present

## 2017-06-01 DIAGNOSIS — M549 Dorsalgia, unspecified: Secondary | ICD-10-CM | POA: Diagnosis not present

## 2017-06-01 DIAGNOSIS — Z7982 Long term (current) use of aspirin: Secondary | ICD-10-CM

## 2017-06-01 DIAGNOSIS — R630 Anorexia: Secondary | ICD-10-CM | POA: Diagnosis not present

## 2017-06-01 DIAGNOSIS — N390 Urinary tract infection, site not specified: Secondary | ICD-10-CM | POA: Insufficient documentation

## 2017-06-01 DIAGNOSIS — R112 Nausea with vomiting, unspecified: Secondary | ICD-10-CM | POA: Diagnosis not present

## 2017-06-01 DIAGNOSIS — J449 Chronic obstructive pulmonary disease, unspecified: Secondary | ICD-10-CM | POA: Diagnosis not present

## 2017-06-01 DIAGNOSIS — R140 Abdominal distension (gaseous): Secondary | ICD-10-CM

## 2017-06-01 DIAGNOSIS — D696 Thrombocytopenia, unspecified: Secondary | ICD-10-CM | POA: Insufficient documentation

## 2017-06-01 DIAGNOSIS — Z5111 Encounter for antineoplastic chemotherapy: Secondary | ICD-10-CM | POA: Diagnosis present

## 2017-06-01 DIAGNOSIS — C651 Malignant neoplasm of right renal pelvis: Secondary | ICD-10-CM

## 2017-06-01 DIAGNOSIS — M5136 Other intervertebral disc degeneration, lumbar region: Secondary | ICD-10-CM

## 2017-06-01 DIAGNOSIS — N183 Chronic kidney disease, stage 3 unspecified: Secondary | ICD-10-CM

## 2017-06-01 DIAGNOSIS — C641 Malignant neoplasm of right kidney, except renal pelvis: Secondary | ICD-10-CM

## 2017-06-01 DIAGNOSIS — R5383 Other fatigue: Secondary | ICD-10-CM

## 2017-06-01 DIAGNOSIS — I714 Abdominal aortic aneurysm, without rupture: Secondary | ICD-10-CM

## 2017-06-01 DIAGNOSIS — K5900 Constipation, unspecified: Secondary | ICD-10-CM

## 2017-06-01 DIAGNOSIS — Z7189 Other specified counseling: Secondary | ICD-10-CM

## 2017-06-01 DIAGNOSIS — D649 Anemia, unspecified: Secondary | ICD-10-CM | POA: Insufficient documentation

## 2017-06-01 DIAGNOSIS — I700 Atherosclerosis of aorta: Secondary | ICD-10-CM

## 2017-06-01 DIAGNOSIS — E039 Hypothyroidism, unspecified: Secondary | ICD-10-CM | POA: Insufficient documentation

## 2017-06-01 DIAGNOSIS — N3281 Overactive bladder: Secondary | ICD-10-CM

## 2017-06-01 DIAGNOSIS — R5381 Other malaise: Secondary | ICD-10-CM | POA: Insufficient documentation

## 2017-06-01 DIAGNOSIS — G893 Neoplasm related pain (acute) (chronic): Secondary | ICD-10-CM | POA: Insufficient documentation

## 2017-06-01 DIAGNOSIS — Z905 Acquired absence of kidney: Secondary | ICD-10-CM | POA: Diagnosis not present

## 2017-06-01 DIAGNOSIS — R197 Diarrhea, unspecified: Secondary | ICD-10-CM | POA: Diagnosis not present

## 2017-06-01 DIAGNOSIS — Z906 Acquired absence of other parts of urinary tract: Secondary | ICD-10-CM | POA: Insufficient documentation

## 2017-06-01 DIAGNOSIS — Z79899 Other long term (current) drug therapy: Secondary | ICD-10-CM

## 2017-06-01 DIAGNOSIS — N400 Enlarged prostate without lower urinary tract symptoms: Secondary | ICD-10-CM

## 2017-06-01 DIAGNOSIS — E785 Hyperlipidemia, unspecified: Secondary | ICD-10-CM

## 2017-06-01 DIAGNOSIS — K0889 Other specified disorders of teeth and supporting structures: Secondary | ICD-10-CM | POA: Insufficient documentation

## 2017-06-01 DIAGNOSIS — T451X5S Adverse effect of antineoplastic and immunosuppressive drugs, sequela: Secondary | ICD-10-CM | POA: Insufficient documentation

## 2017-06-01 DIAGNOSIS — K5730 Diverticulosis of large intestine without perforation or abscess without bleeding: Secondary | ICD-10-CM

## 2017-06-01 DIAGNOSIS — C7951 Secondary malignant neoplasm of bone: Secondary | ICD-10-CM

## 2017-06-01 DIAGNOSIS — I509 Heart failure, unspecified: Secondary | ICD-10-CM | POA: Insufficient documentation

## 2017-06-01 DIAGNOSIS — Z87891 Personal history of nicotine dependence: Secondary | ICD-10-CM

## 2017-06-01 DIAGNOSIS — I2510 Atherosclerotic heart disease of native coronary artery without angina pectoris: Secondary | ICD-10-CM | POA: Insufficient documentation

## 2017-06-01 LAB — COMPREHENSIVE METABOLIC PANEL
ALK PHOS: 179 U/L — AB (ref 38–126)
ALT: 67 U/L — AB (ref 17–63)
AST: 46 U/L — AB (ref 15–41)
Albumin: 3.8 g/dL (ref 3.5–5.0)
Anion gap: 10 (ref 5–15)
BUN: 16 mg/dL (ref 6–20)
CALCIUM: 9.6 mg/dL (ref 8.9–10.3)
CHLORIDE: 98 mmol/L — AB (ref 101–111)
CO2: 28 mmol/L (ref 22–32)
CREATININE: 1.32 mg/dL — AB (ref 0.61–1.24)
GFR calc Af Amer: 60 mL/min — ABNORMAL LOW (ref >60)
GFR, EST NON AFRICAN AMERICAN: 51 mL/min — AB (ref >60)
Glucose, Bld: 156 mg/dL — ABNORMAL HIGH (ref 65–99)
Potassium: 3.5 mmol/L (ref 3.5–5.1)
Sodium: 136 mmol/L (ref 135–145)
Total Bilirubin: 0.9 mg/dL (ref 0.3–1.2)
Total Protein: 7.5 g/dL (ref 6.5–8.1)

## 2017-06-01 LAB — CBC WITH DIFFERENTIAL/PLATELET
BASOS ABS: 0 10*3/uL (ref 0–0.1)
Basophils Relative: 0 %
EOS PCT: 1 %
Eosinophils Absolute: 0.1 10*3/uL (ref 0–0.7)
HCT: 37.1 % — ABNORMAL LOW (ref 40.0–52.0)
HEMOGLOBIN: 12.7 g/dL — AB (ref 13.0–18.0)
LYMPHS PCT: 13 %
Lymphs Abs: 1.2 10*3/uL (ref 1.0–3.6)
MCH: 28.5 pg (ref 26.0–34.0)
MCHC: 34.3 g/dL (ref 32.0–36.0)
MCV: 83 fL (ref 80.0–100.0)
Monocytes Absolute: 0.8 10*3/uL (ref 0.2–1.0)
Monocytes Relative: 8 %
NEUTROS ABS: 7.1 10*3/uL — AB (ref 1.4–6.5)
Neutrophils Relative %: 78 %
PLATELETS: 228 10*3/uL (ref 150–440)
RBC: 4.47 MIL/uL (ref 4.40–5.90)
RDW: 15.3 % — ABNORMAL HIGH (ref 11.5–14.5)
WBC: 9.2 10*3/uL (ref 3.8–10.6)

## 2017-06-01 MED ORDER — SODIUM CHLORIDE 0.9 % IV SOLN
1800.0000 mg | Freq: Once | INTRAVENOUS | Status: AC
  Administered 2017-06-01: 1800 mg via INTRAVENOUS
  Filled 2017-06-01: qty 26.3

## 2017-06-01 MED ORDER — CARBOPLATIN CHEMO INJECTION 450 MG/45ML
380.0000 mg | Freq: Once | INTRAVENOUS | Status: AC
  Administered 2017-06-01: 380 mg via INTRAVENOUS
  Filled 2017-06-01: qty 38

## 2017-06-01 MED ORDER — HEPARIN SOD (PORK) LOCK FLUSH 100 UNIT/ML IV SOLN: 500.0000 [IU] | Freq: Once | INTRAVENOUS | Status: DC | PRN

## 2017-06-01 MED ORDER — OXYCODONE HCL ER 40 MG PO T12A: 40.0000 mg | EXTENDED_RELEASE_TABLET | Freq: Two times a day (BID) | ORAL | 0 refills | Status: DC

## 2017-06-01 MED ORDER — DEXAMETHASONE SODIUM PHOSPHATE 10 MG/ML IJ SOLN
10.0000 mg | Freq: Once | INTRAMUSCULAR | Status: AC
  Administered 2017-06-01: 10 mg via INTRAVENOUS
  Filled 2017-06-01: qty 1

## 2017-06-01 MED ORDER — LORAZEPAM 0.5 MG PO TABS: 0.5000 mg | ORAL_TABLET | Freq: Three times a day (TID) | ORAL | 0 refills | Status: DC | PRN

## 2017-06-01 MED ORDER — HYDROCODONE-ACETAMINOPHEN 7.5-325 MG PO TABS: 1.0000 | ORAL_TABLET | ORAL | 0 refills | Status: DC | PRN

## 2017-06-01 MED ORDER — SODIUM CHLORIDE 0.9 % IV SOLN
Freq: Once | INTRAVENOUS | Status: AC
  Administered 2017-06-01: 10:00:00 via INTRAVENOUS
  Filled 2017-06-01: qty 1000

## 2017-06-01 MED ORDER — SODIUM CHLORIDE 0.9% FLUSH
10.0000 mL | INTRAVENOUS | Status: DC | PRN
  Administered 2017-06-01: 10 mL via INTRAVENOUS
  Filled 2017-06-01: qty 10

## 2017-06-01 MED ORDER — HEPARIN SOD (PORK) LOCK FLUSH 100 UNIT/ML IV SOLN
500.0000 [IU] | Freq: Once | INTRAVENOUS | Status: AC
  Administered 2017-06-01: 500 [IU] via INTRAVENOUS
  Filled 2017-06-01: qty 5

## 2017-06-01 MED ORDER — SODIUM CHLORIDE 0.9 % IV SOLN: 10.0000 mg | Freq: Once | INTRAVENOUS | Status: DC

## 2017-06-01 MED ORDER — MORPHINE SULFATE ER 60 MG PO TBCR: 60.0000 mg | EXTENDED_RELEASE_TABLET | Freq: Two times a day (BID) | ORAL | 0 refills | Status: DC

## 2017-06-01 MED ORDER — PALONOSETRON HCL INJECTION 0.25 MG/5ML
0.2500 mg | Freq: Once | INTRAVENOUS | Status: AC
  Administered 2017-06-01: 0.25 mg via INTRAVENOUS
  Filled 2017-06-01: qty 5

## 2017-06-01 NOTE — Progress Notes (Addendum)
[facility] Initial Visit:  Patient Care Team: [name], MD as PCP - General (Physician Assistant)  CHIEF COMPLAINTS/PURPOSE OF CONSULTATION: Locally advanced kidney urothelia cancer  HISTORY OF PRESENTING ILLNESS: Joseph Ritter 76 y.o. male with PMH listed as below  is referred to [place] for evaluation of locally advanced kidney urothelia cancer. Patient developed gross hematuria at the end of 2017. CT 08/2016 showed endophytic hypodense mass. Repeat image 01/2017 showed enlarged  endophytic hypodense mass right kidney. 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 Right robotic nephroureterectomy was attempted but aborted due to unresectable newly metastatic disease diagnosed intraoperatively. Postop he underwent a CT scan of the abdomen and pelvis which showed extension of an infiltrative mass from the right kidney along the right renal vein toward the IVC with encasement of the IVC.  Today patient is accompanied by his wife, sister and brother in law. 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 He self catheterize a few time a day.  He reports having back pain which limits her ability to walk. Also constipated and abdominal bloating. His appetite is fair. He takes Norco for abdominal and back pain.  He can walk with walker for very short distance, mostly limited by back pain and fatigue./weakness.   INTERVAL HISTORY Cycle 1 treatment was delayed as patient was sent to ER for evaluation due to nausea Vomiting, uncontrolled pain. Pain regimen was adjusted. Urine culture in [place] and he was started on a course of amoxicilline 544m BID for 7 days.  He feels much better today. He also started taking Phenergan which helps better with his nausea. Denies fever, chills. Pain is better controlled with Oxycontin 464mBID plus PRN Norco7.5/325 Q4-6 h. He also uses lidocaine patch on his right  buttock as needed.   Review of Systems  Constitutional: Positive for appetite change and fatigue.  HENT:  Negative.   Eyes: Negative.   Respiratory: Negative.   Cardiovascular: Negative.   Gastrointestinal: Positive for abdominal distention and constipation.  Endocrine: Negative.   Genitourinary: Positive for difficulty urinating.   Musculoskeletal: Positive for back pain.  Skin: Negative.     MEDICAL HISTORY: Past Medical History:  Diagnosis Date  . Acid indigestion 03/09/2014  . Adult hypothyroidism 11/18/2012  . Anxiety   . Arteriosclerosis of coronary artery 11/18/2012   Overview:  PATENT LIMA TO LAD, PATENT SVG TO PDA AND OCCULUDED SVG TO OM2 BY CATHERIZATION 02/09/2009   . 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 Borderline diabetes 05/25/2014  . BP (high blood pressure) 11/18/2012  . BPH (benign prostatic hypertrophy)   . CAFL (chronic airflow limitation) ([facility]/04/2015  . CCF (congestive cardiac failure) ([facility]/07/2014   Overview:  HX OF   . Clinical depression 05/03/2015  . COPD (chronic obstructive pulmonary disease) ([facility]  . DDD (degenerative disc disease), lumbar 06/29/2014  . 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 Gastric ulcer 03/09/2014  . 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 History of urinary self-catheterization 2017  . HLD (hyperlipidemia) 01/03/2014  . Incomplete bladder emptying 11/18/2012  . Leg weakness 11/18/2012  . Lumbar radiculitis   . Neuritis or radiculitis due to rupture of lumbar intervertebral disc 06/29/2014  . Overactive bladder   . PONV (postoperative nausea and vomiting)  years ago with Ether, no problem with Nausea or vomiting with the last few surgerys  . Pre-diabetes   . Subacute transverse myelitis ([place]) 11/21/2012  . Teeth problem    pt reports "bad teeth", "need to be pulled"    SURGICAL  HISTORY: Past Surgical History:  Procedure Laterality Date  . ARTERY BIOPSY Right 12/16/2015   Procedure: BIOPSY TEMPORAL ARTERY;  Surgeon: Algernon Huxley, MD;  Location: [facility];  Service: Vascular;  Laterality: Right;  . CARDIAC CATHETERIZATION    . CATARACT EXTRACTION W/PHACO Left 01/26/2016   Procedure: CATARACT EXTRACTION PHACO AND INTRAOCULAR LENS PLACEMENT (IOC) LEFT EYE;  Surgeon: Leandrew Koyanagi, MD;  Location: [place];  Service: Ophthalmology;  Laterality: Left;  . CORONARY ARTERY BYPASS GRAFT  04/07/2002   [facility]  . Cysto Bladder Botox Injection  07/02/2014  . CYSTOSCOPY W/ RETROGRADES Right 03/16/2017   Procedure: CYSTOSCOPY WITH RETROGRADE PYELOGRAM;  Surgeon: [name], MD;  Location: [facility];  Service: Urology;  Laterality: Right;  . CYSTOSCOPY WITH STENT PLACEMENT Right 03/16/2017   Procedure: CYSTOSCOPY WITH STENT PLACEMENT;  Surgeon: [name], MD;  Location: [facility];  Service: Urology;  Laterality: Right;  . EYE SURGERY Left    blood clot behind left eye  . HAND SURGERY Bilateral   . HERNIA REPAIR Right    inguinal hernia repair  . LEFT HEART CATH AND CORS/GRAFTS ANGIOGRAPHY N/A 04/25/2017   Procedure: Left Heart Cath and Cors/Grafts Angiography;  Surgeon: Isaias Cowman, MD;  Location: [facility];  Service: Cardiovascular;  Laterality: N/A;  . PORTA CATH INSERTION N/A 05/30/2017   Procedure: Glori Luis Cath Insertion;  Surgeon: Katha Cabal, MD;  Location: [facility];  Service: Cardiovascular;  Laterality: N/A;  . ROBOT ASSITED LAPAROSCOPIC NEPHROURETERECTOMY Right 05/02/2017   Procedure: ROBOT ASSITED LAPAROSCOPIC NEPHROURETERECTOMY ATTEMPTED;  Surgeon: [name], MD;  Location: [facility];  Service: Urology;  Laterality: Right;  . URETEROSCOPY Right 03/16/2017   Procedure: URETEROSCOPY BIOPSY RENAL MASS;  Surgeon: [name], MD;  Location: [facility];  Service: Urology;  Laterality: Right;    SOCIAL  HISTORY: Social History   Social History  . Marital status: Married    Spouse name: N/A  . Number of children: N/A  . Years of education: N/A   Occupational History  . Not on file.   Social History Main Topics  . Smoking status: Former Smoker    Types: Cigarettes    Quit date: 04/07/2002  . Smokeless tobacco: Former Systems developer    Types: Snuff, Chew  . Alcohol use No  . Drug use: No  . Sexual activity: Not on file   Other Topics Concern  . Not on file   Social History Narrative  . No narrative on file    FAMILY HISTORY Family History  Problem Relation Age of Onset  . Heart attack Mother   . Heart disease Father   . Prostate cancer Neg Hx   . Bladder Cancer Neg Hx   . Kidney cancer Neg Hx     ALLERGIES:  has No Known Allergies.  MEDICATIONS:  Current Outpatient Prescriptions  Medication Sig Dispense Refill  . acetaminophen (TYLENOL) 500 MG tablet Take 500 mg by mouth 2 (two) times daily as needed (for pain.).     Marland Kitchen aspirin EC 81 MG tablet Take 81 mg by mouth daily after breakfast.     . buPROPion (WELLBUTRIN SR) 100 MG 12 hr tablet Take 100 mg by mouth  daily after breakfast.    . cyclobenzaprine (FLEXERIL) 10 MG tablet Take 10 mg by mouth at bedtime as needed for muscle spasms.    Marland Kitchen ezetimibe-simvastatin (VYTORIN) 10-40 MG tablet Take 1 tablet by mouth at bedtime.    Marland Kitchen HYDROcodone-acetaminophen (NORCO) 7.5-325 MG tablet   0  . 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    Marland Kitchen ketoconazole (NIZORAL) 2 % shampoo APPLY SHAMPOO TO HAIR AS NEEDED FOR FLAKY/ITCHY SCALP (TYPICALLY EVERY 2-3 DAYS)    . levothyroxine (SYNTHROID, LEVOTHROID) 100 MCG tablet Take 100 mcg by mouth daily before breakfast.    . lidocaine (LIDODERM) 5 % Place 1 patch onto the skin every 12 (twelve) hours. Remove & Discard patch within 12 hours or as directed by MD 10 patch 0  .  lidocaine-prilocaine (EMLA) cream Apply to affected area once 30 g 3  . LORazepam (ATIVAN) 0.5 MG tablet Take 1 tablet (0.5 mg total) by mouth every 6 (six) hours as needed (Nausea or vomiting). 30 tablet 0  . Methylcellulose, Laxative, (CITRUCEL PO) Take 1 tablet by mouth daily after breakfast.    . Multiple Vitamin (MULTIVITAMIN WITH MINERALS) TABS tablet Take 1 tablet by mouth daily after breakfast. CENTRUM SILVER    . ondansetron (ZOFRAN) 8 MG tablet Take 1 tablet (8 mg total) by mouth 2 (two) times daily as needed for refractory nausea / vomiting. Start on day 3 after carboplatin chemo. 30 tablet 1  . oxyCODONE (OXYCONTIN) 15 mg 12 hr tablet Take 15 mg by mouth every 12 (twelve) hours.    Marland Kitchen oxyCODONE (OXYCONTIN) 40 mg 12 hr tablet Take 1 tablet (40 mg total) by mouth every 12 (twelve) hours. (Patient not taking: Reported on 05/25/2017) 28 tablet 0  . PEPPERMINT OIL PO Take 1 capsule by mouth 3 (three) times daily as needed (for IBS). IBgard (active ingredient: 90 mg ultrapurified peppermint oil)    . polyethylene glycol powder (GLYCOLAX/MIRALAX) powder Take 17 g by mouth daily after breakfast. Mix with glass of water 255 g 1  . Probiotic Product (PROBIOTIC PO) Take 1 capsule by mouth daily as needed (for digestive health (see instructions)). AFTER BREAKFAST EITHER TAKE A PROBIOTIC OR EAT YOGURT    . prochlorperazine (COMPAZINE) 10 MG tablet Take 1 tablet (10 mg total) by mouth every 6 (six) hours as needed (Nausea or vomiting). 30 tablet 1  . promethazine (PHENERGAN) 25 MG tablet Take 1 tablet (25 mg total) by mouth every 6 (six) hours as needed for nausea or vomiting. 30 tablet 0  . RABEprazole (ACIPHEX) 20 MG tablet Take 20 mg by mouth daily before breakfast.     . ramipril (ALTACE) 10 MG capsule Take 10 mg by mouth daily after breakfast.    . senna (SENOKOT) 8.6 MG TABS tablet Take 2 tablets (17.2 mg total) by mouth daily. 120 each 0  . sertraline (ZOLOFT) 100 MG tablet Take 100 mg by mouth  daily after breakfast.    . tamsulosin (FLOMAX) 0.4 MG CAPS capsule Take 0.8 mg by mouth daily after supper.      No current facility-administered medications for this visit.    Facility-Administered Medications Ordered in Other Visits  Medication Dose Route Frequency Provider Last Rate Last Dose  . heparin lock flush 100 unit/mL  500 Units Intravenous Once Earlie Server, MD      . sodium chloride flush (NS) 0.9 % injection  10 mL  10 mL Intravenous PRN Earlie Server, MD   10 mL at 06/01/17 0830    PHYSICAL EXAMINATION:  ECOG PERFORMANCE STATUS: 2 - Symptomatic, <50% confined to bed   Vitals:   06/01/17 0904  BP: 122/81  Pulse: 80  Resp: 20  Temp: 98.1 F (36.7 C)    Filed Weights   06/01/17 0904  Weight: 158 lb 8 oz (71.9 kg)     Physical Exam GENERAL: No distress, well nourished. Sitting in wheelchair. SKIN:  No rashes or significant lesions  HEAD: Normocephalic, No masses, lesions, tenderness or abnormalities  EYES: Conjunctiva are pale,  non icteric ENT: External ears normal ,lips , buccal mucosa, and tongue normal and mucous membranes are moist  LYMPH: No palpable cervical and axillary lymphadenopathy  LUNGS: Clear to auscultation, no crackles or wheezes HEART: Regular rate & rhythm, no murmurs, no gallops, S1 normal and S2 normal  ABDOMEN: Abdomen is distended, with normal bowel sounds MUSCULOSKELETAL: lumbar spine tenderness. EXTREMITIES: No edema, no skin discoloration or tenderness NEURO: Alert & oriented, lower extremity strength 3/5 bilaterllay. Normal upper extremity strength.    LABORATORY DATA: I have personally reviewed the data as listed: CBC    Component Value Date/Time   WBC 9.2 06/01/2017 0835   RBC 4.47 06/01/2017 0835   HGB 12.7 (L) 06/01/2017 0835   HGB 15.3 11/15/2012 1641   HCT 37.1 (L) 06/01/2017 0835   HCT 43.7 11/15/2012 1641   PLT 228 06/01/2017 0835   PLT 166 11/15/2012 1641   MCV 83.0 06/01/2017 0835   MCV 89 11/15/2012 1641   MCH 28.5  06/01/2017 0835   MCHC 34.3 06/01/2017 0835   RDW 15.3 (H) 06/01/2017 0835   RDW 13.3 11/15/2012 1641   LYMPHSABS 1.2 06/01/2017 0835   LYMPHSABS 1.7 11/15/2012 1641   MONOABS 0.8 06/01/2017 0835   MONOABS 0.8 11/15/2012 1641   EOSABS 0.1 06/01/2017 0835   EOSABS 0.1 11/15/2012 1641   BASOSABS 0.0 06/01/2017 0835   BASOSABS 0.0 11/15/2012 1641   CMP Latest Ref Rng & Units 05/28/2017 05/25/2017 05/25/2017  Glucose 65 - 99 mg/dL 141(H) 183(H) 157(H)  BUN 6 - 20 mg/dL _0 Creatinine 0.61 - 1.24 mg/dL 1.17 1.33(H) 1.56(H)  Sodium 135 - 145 mmol/L 136 139 140  Potassium 3.5 - 5.1 mmol/L 3.7 3.9 5.1  Chloride 101 - 111 mmol/L 99(L) 102 102  CO2 22 - 32 mmol/L _1 Calcium 8.9 - 10.3 mg/dL 9.5 9.4 9.9  Total Protein 6.5 - 8.1 g/dL 7.6 7.4 8.0  Total Bilirubin 0.3 - 1.2 mg/dL 0.8 0.7 0.6  Alkaline Phos 38 - 126 U/L 141(H) 136(H) 147(H)  AST 15 - 41 U/L 31 27 33  ALT 17 - 63 U/L 40 42 45     RADIOGRAPHIC STUDIES: I have personally reviewed the radiological images as listed and agree with the findings in the report CT chest and abdomen 05/03/2017 IMPRESSION: 1. Pneumoperitoneum with a small amount of ascites, gas tracking along the thoracoabdominal wall into the scrotum, and a small amount of gas in the right perirenal space-these findings are likely related to the patient's attempted nephroureterectomy yesterday. Surveillance to ensure expected resolution of the pneumoperitoneum might be appropriate. 2. The right kidney lower pole infarct with lack of patency of the more inferior of the 3 right renal arteries. There is delayed nephrogram on the right with increase an infiltrative process in theright kidney and extending into the right renal hilar vascular  structures, including the right renal vein and the adjacent IVC, compatible with tumor thrombus. There is also soft tissue density surrounding the renal hilar vascular structures and extending into the retroperitoneum and  periaortic region, some of which may be tumor and some of which may be hematoma.3. Demineralization of the anterior inferior L2 vertebral body,concerning for tumor invasion. This is near the level of the retroaortic left renal vein. 4. No findings of metastatic disease to the chest. There are new trace bilateral pleural effusions with mild passive atelectasis. 5.  Aortic Atherosclerosis (ICD10-I70.0).  Coronary atherosclerosis. 6. Inflammatory stranding from the right perirenal space extends down into the pelvis and crosses the midline. There is presacral edema. 7. 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 Recommend followup by ultrasound in 3 years. This recommendation follows ACR consensus guidelines: White Paper of the ACR Incidental Findings Committee II on Vascular Findings. J Am Coll Radiol 2013; 16:109-604 8. Wall thickening along the left side of the urinary bladder may be incidental, but synchronous sessile transitional cell carcinoma is not readily excluded.  CT abdomen wo contrast 02/09/2017  Increased size of 4.6 cm ill-defined hypoenhancing mass ininterpolar region of right kidney, with obstruction of upper pole collecting system. This does not have typical appearance for pyelonephritis or renal cell carcinoma, and raises suspicion for urothelial carcinoma. Consider ureteroscopy for further evaluation. No evidence of metastatic disease. Stable mildly enlarged prostate and mild diffuse bladder wall thickening. Stable 3.0 cm infrarenal abdominal aortic aneurysm. Recommend followup by ultrasound in 3 years. This recommendation follows ACR consensus guidelines: White Paper of the ACR Incidental Findings Committee II on Vascular Findings. [place] [phone]. Colonic diverticulosis. No radiographic evidence of diverticulitis. Stable small fat-containing left inguinal hernia. MRI thoracic and lumbar w/wo contrast.  1. Malignant invasion of the L2  vertebral body by the adjacent retroperitoneal mass. No pathologic fracture or osseous metastatic disease elsewhere in the thoracolumbar spine. 2. Areas of epidural contrast enhancement of the lumbar spine are most consistent with a dilated epidural venous plexus secondary to invasion of the inferior vena cava by the patient's urothelial tumor. 3. Moderate L4-L5 multifactorial spinal canal stenosis with severe right and mild-to-moderate left neural foraminal stenosis. 4. Moderate right and severe left multifactorial L5-S1 neural foraminal stenosis. There is also severe attenuation of the thecal sac at this level but no bony spinal canal stenosis. 5. No spinal canal or neural foraminal stenosis in the thoracic spine.  Pathology 05/02/2017   SPECIMEN SUBMITTED:  A. Hilar tissue, right kidney  B. Hilar tissue, right kidney  DIAGNOSIS:  A and B. 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   Comment:  One fragment in part A contains an infiltrative pleomorphic neoplasm.  Immunohistochemistry (IHC) was performed for further characterization,  and the neoplastic cells are positive for cytokeratins (AE1/AE3/Cam5.2),  with diffuse strong staining. Scattered cells show weak to moderate  staining for GATA3, which supports the above diagnosis.   The tumor is infiltrating adipose tissue, and no nodal tissue is  identified. Clinical correlation is needed to determine if this  represents metastasis or direct extension from the kidney tumor.   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 Cancer Staging Transitional cell carcinoma of kidney, right [facility]) Staging form: Kidney,  AJCC 8th Edition -  Clinical stage from 05/08/2017: Stage IV (cT3, cNX, cM1) - Signed by Earlie Server, MD on 05/08/2017  1. Transitional cell carcinoma of kidney, right ([place])   2. 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 Goals of care, counseling/discussion   4. CKD (chronic kidney disease) stage 3, GFR 30-59 ml/min    1 Pathology and image results were discussed with patient and his families.  He has locally advanced urothelia carcinoma of the kidney.  # PD-L1 expression 0%, not eligible for frontline immunotherapy.  Ok to procced Gemcitabine and Carboplain. Patient has been to chemotherapy class. Cycle 1 treatment was delayed due to UTI.   2 pain: conitnue OxyContin 40 mg twice a day with norco PRN.  3 Nausea: continue zofran, phenergan, compazine as needed. Ativan 0.19m PRN anxitey/nasuea Rx was provided.   06/01/2017 2:58PM pharmacy called that patient's copay for oxcontin 465mBid is $ 500  And can't afford. Will switch to MS contin 6059mID. Rx printed and patient to pick up.   # follow up with RadOnc to finish RT to L2 lesion for symptom relief.  # hold Xgeva Q28 days for metastatic bone disease. Patient has poor dentition. Ask him to obtain dental clearance.    Follow up 1.week day 8 gemcitabine.    No orders of the defined types were placed in this encounter.  All questions were answered. The patient knows to call the clinic with any problems, questions or concerns.    ZhoEarlie ServerD  06/01/2017 8:49 AM

## 2017-06-01 NOTE — Addendum Note (Signed)
Addended by: Earlie Server on: 06/01/2017 02:59 PM   Modules accepted: Orders

## 2017-06-01 NOTE — Telephone Encounter (Signed)
Cost of OxyContin is >$500 asking if we want to proceed with paying for it on the fund. States that MS ER is a lot cheaper. I discussed with Dr Tasia Catchings who has changed it to MS ER 60 mg every 12 hours.  Wife informed and [facility] at [facility] also. Wife has enough medicine for him until next week and will pick up MS prescription on Monday when they come in for XRT

## 2017-06-01 NOTE — Progress Notes (Signed)
Patient reports nausea and vomiting have improved. Patient reports pain to right side of back, rates pain 4/10 this morning.

## 2017-06-04 ENCOUNTER — Ambulatory Visit
Admission: RE | Admit: 2017-06-04 | Discharge: 2017-06-04 | Disposition: A | Discharge status: Home / Self Care | Payer: Medicare Other | Source: Ambulatory Visit | Attending: Radiation Oncology | Admitting: Radiation Oncology

## 2017-06-04 DIAGNOSIS — C7951 Secondary malignant neoplasm of bone: Secondary | ICD-10-CM | POA: Diagnosis not present

## 2017-06-05 ENCOUNTER — Ambulatory Visit
Admission: RE | Admit: 2017-06-05 | Discharge: 2017-06-05 | Disposition: A | Discharge status: Home / Self Care | Payer: Medicare Other | Source: Ambulatory Visit | Attending: Radiation Oncology | Admitting: Radiation Oncology

## 2017-06-05 DIAGNOSIS — C7951 Secondary malignant neoplasm of bone: Secondary | ICD-10-CM | POA: Diagnosis not present

## 2017-06-06 ENCOUNTER — Ambulatory Visit
Admission: RE | Admit: 2017-06-06 | Discharge: 2017-06-06 | Disposition: A | Discharge status: Home / Self Care | Payer: Medicare Other | Source: Ambulatory Visit | Attending: Radiation Oncology | Admitting: Radiation Oncology

## 2017-06-06 DIAGNOSIS — C7951 Secondary malignant neoplasm of bone: Secondary | ICD-10-CM | POA: Diagnosis not present

## 2017-06-07 ENCOUNTER — Ambulatory Visit
Admission: RE | Admit: 2017-06-07 | Discharge: 2017-06-07 | Disposition: A | Discharge status: Home / Self Care | Payer: Medicare Other | Source: Ambulatory Visit | Attending: Radiation Oncology | Admitting: Radiation Oncology

## 2017-06-07 DIAGNOSIS — C7951 Secondary malignant neoplasm of bone: Secondary | ICD-10-CM | POA: Diagnosis not present

## 2017-06-08 ENCOUNTER — Encounter: Payer: Self-pay | Admitting: Oncology

## 2017-06-08 ENCOUNTER — Ambulatory Visit: Payer: Medicare Other

## 2017-06-08 ENCOUNTER — Inpatient Hospital Stay: Payer: Medicare Other

## 2017-06-08 ENCOUNTER — Inpatient Hospital Stay (HOSPITAL_BASED_OUTPATIENT_CLINIC_OR_DEPARTMENT_OTHER): Payer: Medicare Other | Admitting: Oncology

## 2017-06-08 ENCOUNTER — Ambulatory Visit: Admission: RE | Admit: 2017-06-08 | Payer: Medicare Other | Source: Ambulatory Visit

## 2017-06-08 VITALS — BP 113/77 | HR 101 | Temp 97.8°F | Resp 22 | Ht 66.0 in | Wt 158.0 lb

## 2017-06-08 DIAGNOSIS — C641 Malignant neoplasm of right kidney, except renal pelvis: Secondary | ICD-10-CM

## 2017-06-08 DIAGNOSIS — K0889 Other specified disorders of teeth and supporting structures: Secondary | ICD-10-CM

## 2017-06-08 DIAGNOSIS — C679 Malignant neoplasm of bladder, unspecified: Secondary | ICD-10-CM | POA: Diagnosis not present

## 2017-06-08 DIAGNOSIS — K5730 Diverticulosis of large intestine without perforation or abscess without bleeding: Secondary | ICD-10-CM

## 2017-06-08 DIAGNOSIS — N183 Chronic kidney disease, stage 3 unspecified: Secondary | ICD-10-CM

## 2017-06-08 DIAGNOSIS — E785 Hyperlipidemia, unspecified: Secondary | ICD-10-CM

## 2017-06-08 DIAGNOSIS — G893 Neoplasm related pain (acute) (chronic): Secondary | ICD-10-CM

## 2017-06-08 DIAGNOSIS — N3281 Overactive bladder: Secondary | ICD-10-CM

## 2017-06-08 DIAGNOSIS — R112 Nausea with vomiting, unspecified: Secondary | ICD-10-CM | POA: Diagnosis not present

## 2017-06-08 DIAGNOSIS — Z87891 Personal history of nicotine dependence: Secondary | ICD-10-CM

## 2017-06-08 DIAGNOSIS — C651 Malignant neoplasm of right renal pelvis: Secondary | ICD-10-CM

## 2017-06-08 DIAGNOSIS — C7951 Secondary malignant neoplasm of bone: Secondary | ICD-10-CM

## 2017-06-08 DIAGNOSIS — J449 Chronic obstructive pulmonary disease, unspecified: Secondary | ICD-10-CM

## 2017-06-08 DIAGNOSIS — R531 Weakness: Secondary | ICD-10-CM | POA: Diagnosis not present

## 2017-06-08 DIAGNOSIS — R7303 Prediabetes: Secondary | ICD-10-CM

## 2017-06-08 DIAGNOSIS — R5383 Other fatigue: Secondary | ICD-10-CM | POA: Diagnosis not present

## 2017-06-08 DIAGNOSIS — Z7982 Long term (current) use of aspirin: Secondary | ICD-10-CM

## 2017-06-08 DIAGNOSIS — N319 Neuromuscular dysfunction of bladder, unspecified: Secondary | ICD-10-CM | POA: Diagnosis not present

## 2017-06-08 DIAGNOSIS — K5900 Constipation, unspecified: Secondary | ICD-10-CM

## 2017-06-08 DIAGNOSIS — I714 Abdominal aortic aneurysm, without rupture: Secondary | ICD-10-CM

## 2017-06-08 DIAGNOSIS — R140 Abdominal distension (gaseous): Secondary | ICD-10-CM | POA: Diagnosis not present

## 2017-06-08 DIAGNOSIS — M48061 Spinal stenosis, lumbar region without neurogenic claudication: Secondary | ICD-10-CM

## 2017-06-08 DIAGNOSIS — I700 Atherosclerosis of aorta: Secondary | ICD-10-CM

## 2017-06-08 DIAGNOSIS — N390 Urinary tract infection, site not specified: Secondary | ICD-10-CM | POA: Diagnosis not present

## 2017-06-08 DIAGNOSIS — N400 Enlarged prostate without lower urinary tract symptoms: Secondary | ICD-10-CM

## 2017-06-08 DIAGNOSIS — M549 Dorsalgia, unspecified: Secondary | ICD-10-CM

## 2017-06-08 DIAGNOSIS — Z79899 Other long term (current) drug therapy: Secondary | ICD-10-CM

## 2017-06-08 DIAGNOSIS — Z5111 Encounter for antineoplastic chemotherapy: Secondary | ICD-10-CM | POA: Diagnosis not present

## 2017-06-08 DIAGNOSIS — M5136 Other intervertebral disc degeneration, lumbar region: Secondary | ICD-10-CM

## 2017-06-08 LAB — COMPREHENSIVE METABOLIC PANEL
ALK PHOS: 221 U/L — AB (ref 38–126)
ALT: 77 U/L — AB (ref 17–63)
AST: 45 U/L — AB (ref 15–41)
Albumin: 3.6 g/dL (ref 3.5–5.0)
Anion gap: 13 (ref 5–15)
BUN: 19 mg/dL (ref 6–20)
CALCIUM: 9.5 mg/dL (ref 8.9–10.3)
CHLORIDE: 97 mmol/L — AB (ref 101–111)
CO2: 25 mmol/L (ref 22–32)
CREATININE: 1.19 mg/dL (ref 0.61–1.24)
GFR calc Af Amer: >60 mL/min (ref >60)
GFR calc non Af Amer: 58 mL/min — ABNORMAL LOW (ref >60)
GLUCOSE: 149 mg/dL — AB (ref 65–99)
Potassium: 3.8 mmol/L (ref 3.5–5.1)
SODIUM: 135 mmol/L (ref 135–145)
Total Bilirubin: 0.7 mg/dL (ref 0.3–1.2)
Total Protein: 7.5 g/dL (ref 6.5–8.1)

## 2017-06-08 LAB — CBC WITH DIFFERENTIAL/PLATELET
BASOS ABS: 0 10*3/uL (ref 0–0.1)
Basophils Relative: 0 %
EOS ABS: 0.1 10*3/uL (ref 0–0.7)
EOS PCT: 3 %
HCT: 34.2 % — ABNORMAL LOW (ref 40.0–52.0)
HEMOGLOBIN: 11.5 g/dL — AB (ref 13.0–18.0)
LYMPHS ABS: 1.1 10*3/uL (ref 1.0–3.6)
LYMPHS PCT: 23 %
MCH: 28.1 pg (ref 26.0–34.0)
MCHC: 33.8 g/dL (ref 32.0–36.0)
MCV: 83.3 fL (ref 80.0–100.0)
Monocytes Absolute: 0.2 10*3/uL (ref 0.2–1.0)
Monocytes Relative: 5 %
Neutro Abs: 3.1 10*3/uL (ref 1.4–6.5)
Neutrophils Relative %: 69 %
PLATELETS: 111 10*3/uL — AB (ref 150–440)
RBC: 4.1 MIL/uL — AB (ref 4.40–5.90)
RDW: 14.7 % — ABNORMAL HIGH (ref 11.5–14.5)
WBC: 4.6 10*3/uL (ref 3.8–10.6)

## 2017-06-08 MED ORDER — LORAZEPAM 2 MG/ML IJ SOLN
1.0000 mg | Freq: Once | INTRAMUSCULAR | Status: AC
  Administered 2017-06-08: 1 mg via INTRAVENOUS
  Filled 2017-06-08: qty 1

## 2017-06-08 MED ORDER — SODIUM CHLORIDE 0.9 % IV SOLN
Freq: Once | INTRAVENOUS | Status: AC
  Administered 2017-06-08: 11:00:00 via INTRAVENOUS
  Filled 2017-06-08: qty 4

## 2017-06-08 MED ORDER — SODIUM CHLORIDE 0.9 % IV SOLN
Freq: Once | INTRAVENOUS | Status: AC
  Administered 2017-06-08: 10:00:00 via INTRAVENOUS
  Filled 2017-06-08: qty 1000

## 2017-06-08 MED ORDER — HEPARIN SOD (PORK) LOCK FLUSH 100 UNIT/ML IV SOLN
500.0000 [IU] | Freq: Once | INTRAVENOUS | Status: AC
  Administered 2017-06-08: 500 [IU] via INTRAVENOUS
  Filled 2017-06-08: qty 5

## 2017-06-08 MED ORDER — SODIUM CHLORIDE 0.9 % IV SOLN: Freq: Once | INTRAVENOUS | Status: DC

## 2017-06-08 MED ORDER — SODIUM CHLORIDE 0.9% FLUSH
10.0000 mL | INTRAVENOUS | Status: DC | PRN
  Administered 2017-06-08: 10 mL via INTRAVENOUS
  Filled 2017-06-08: qty 10

## 2017-06-08 MED ORDER — SODIUM CHLORIDE 0.9 % IV SOLN
Freq: Once | INTRAVENOUS | Status: AC
  Administered 2017-06-08: 11:00:00 via INTRAVENOUS
  Filled 2017-06-08: qty 5

## 2017-06-08 MED ORDER — SODIUM CHLORIDE 0.9 % IV SOLN: 20.0000 mg | Freq: Once | INTRAVENOUS | Status: DC

## 2017-06-08 MED ORDER — LEVOFLOXACIN 500 MG PO TABS: 500.0000 mg | ORAL_TABLET | Freq: Every day | ORAL | 0 refills | Status: DC

## 2017-06-08 MED ORDER — HYDROMORPHONE HCL 1 MG/ML IJ SOLN
1.0000 mg | Freq: Once | INTRAMUSCULAR | Status: AC
  Administered 2017-06-08: 1 mg via INTRAVENOUS
  Filled 2017-06-08: qty 1

## 2017-06-08 NOTE — Progress Notes (Addendum)
[facility] Initial Visit:  Patient Care Team: [name], MD as PCP - General (Physician Assistant)  CHIEF COMPLAINTS/PURPOSE OF CONSULTATION: Locally advanced kidney urothelia cancer  HISTORY OF PRESENTING ILLNESS: Joseph Ritter 76 y.o. male with PMH listed as below  is referred to [place] for evaluation of locally advanced kidney urothelia cancer. Patient developed gross hematuria at the end of 2017. CT 08/2016 showed endophytic hypodense mass. Repeat image 01/2017 showed enlarged  endophytic hypodense mass right kidney. 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 Right robotic nephroureterectomy was attempted but aborted due to unresectable newly metastatic disease diagnosed intraoperatively. Postop he underwent a CT scan of the abdomen and pelvis which showed extension of an infiltrative mass from the right kidney along the right renal vein toward the IVC with encasement of the IVC.  Today patient is accompanied by his wife, sister and brother in law. 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 He self catheterize a few time a day.  He reports having back pain which limits her ability to walk. Also constipated and abdominal bloating. His appetite is fair. He takes Norco for abdominal and back pain.  He can walk with walker for very short distance, mostly limited by back pain and fatigue./weakness.   INTERVAL HISTORY  Patient is here for evaluation prior to Day 8 of cycle 1 Carboplatin and Gemcitabine. He is also here for palliative RT. He vomited in the treatment room and RT was cancelled.  He is accompanied by his wife. Patient has nausea but relieved by home antiemetics earlier this week. Per wife, yesterday he vomited once and this morning he feels very nausea and vomited.  He also complains pain.  He is on Hydrocodone 7.5/325mg Q4h PRN and Oxycotin 42m BID. Patient reports current  pain regimen works and brought down pain to 6 out 10. Currently he reports having 10/10 back pain which was triggered by having to get into and out of the car.  Cancer center covered the initial 2 weeks supply of OxyContin while working on pre authorization. Insurance copay for 1 months Oxycontin is 500 dollars and patient can not afford. I changed his pain regimen to MS contin 677mBID after he finishes current Oxycontin supply. Patient has not picked up yet. Denies fever.    Review of Systems  Constitutional: Positive for appetite change and fatigue.  HENT:  Negative.   Eyes: Negative.   Respiratory: Negative.   Cardiovascular: Negative.   Gastrointestinal: Positive for abdominal distention, constipation, diarrhea, nausea and vomiting.  Endocrine: Negative.   Genitourinary: Positive for difficulty urinating.   Musculoskeletal: Positive for back pain.  Skin: Negative.     MEDICAL HISTORY: Past Medical History:  Diagnosis Date  . Acid indigestion 03/09/2014  . Adult hypothyroidism 11/18/2012  . Anxiety   . Arteriosclerosis of coronary artery 11/18/2012   Overview:  PATENT LIMA TO LAD, PATENT SVG TO PDA AND OCCULUDED SVG TO OM2 BY CATHERIZATION 02/09/2009   . 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 Borderline diabetes 05/25/2014  . BP (high blood pressure) 11/18/2012  . BPH (benign prostatic hypertrophy)   . CAFL (chronic airflow limitation) [place]  . CCF ([place]estive cardiac failure) [place]   Overview:  HX OF   . Clinical depression 05/03/2015  . COPD (ch[place] obstructive pulmonary disease) ([facility]  . DDD (degenerative disc disease), lumbar 06/29/2014  . 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 Gastric ulcer 03/09/2014  . 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 History of urinary self-catheterization 2017  . HLD (hyperlipidemia) 01/03/2014   . Incomplete bladder emptying 11/18/2012  . Leg weakness 11/18/2012  . Lumbar radiculitis   . Neuritis or radiculitis due to rupture of lumbar intervertebral disc 06/29/2014  . Overactive bladder   . PONV (postoperative nausea and vomiting)    years ago with Ether, no problem with Nausea or vomiting with the last few surgerys  . Pre-diabetes   . Subacute transverse myelitis ([place]) 11/21/2012  . Teeth problem    pt reports "bad teeth", "need to be pulled"    SURGICAL HISTORY: Past Surgical History:  Procedure Laterality Date  . ARTERY BIOPSY Right 12/16/2015   Procedure: BIOPSY TEMPORAL ARTERY;  Surgeon: Algernon Huxley, MD;  Location: [facility];  Service: Vascular;  Laterality: Right;  . CARDIAC CATHETERIZATION    . CATARACT EXTRACTION W/PHACO Left 01/26/2016   Procedure: CATARACT EXTRACTION PHACO AND INTRAOCULAR LENS PLACEMENT (IOC) LEFT EYE;  Surgeon: Leandrew Koyanagi, MD;  Location: [facility];  Service: Ophthalmology;  Laterality: Left;  . CORONARY ARTERY BYPASS GRAFT  04/07/2002   [facility]  . Cysto Bladder Botox Injection  07/02/2014  . CYSTOSCOPY W/ RETROGRADES Right 03/16/2017   Procedure: CYSTOSCOPY WITH RETROGRADE PYELOGRAM;  Surgeon: [name], MD;  Location: [facility];  Service: Urology;  Laterality: Right;  . CYSTOSCOPY WITH STENT PLACEMENT Right 03/16/2017   Procedure: CYSTOSCOPY WITH STENT PLACEMENT;  Surgeon: [name], MD;  Location: [facility];  Service: Urology;  Laterality: Right;  . EYE SURGERY Left    blood clot behind left eye  . HAND SURGERY Bilateral   . HERNIA REPAIR Right    inguinal hernia repair  . LEFT HEART CATH AND CORS/GRAFTS ANGIOGRAPHY N/A 04/25/2017   Procedure: Left Heart Cath and Cors/Grafts Angiography;  Surgeon: Isaias Cowman, MD;  Location: [facility];  Service: Cardiovascular;  Laterality: N/A;  . PORTA CATH INSERTION N/A 05/30/2017   Procedure: Glori Luis Cath Insertion;  Surgeon: Katha Cabal, MD;  Location: [facility];  Service: Cardiovascular;  Laterality: N/A;  . ROBOT ASSITED LAPAROSCOPIC NEPHROURETERECTOMY Right 05/02/2017   Procedure: ROBOT ASSITED LAPAROSCOPIC NEPHROURETERECTOMY ATTEMPTED;  Surgeon: [name], MD;  Location: [facility];  Service: Urology;  Laterality: Right;  . URETEROSCOPY Right 03/16/2017   Procedure: URETEROSCOPY BIOPSY RENAL MASS;  Surgeon: [name], MD;  Location: [facility];  Service: Urology;  Laterality: Right;    SOCIAL HISTORY: Social History   Social History  . Marital status: Married    Spouse name: N/A  . Number of children: N/A  . Years of education: N/A   Occupational History  . Not on file.   Social History Main Topics  . Smoking status: Former Smoker    Types: Cigarettes    Quit date: 04/07/2002  . Smokeless tobacco: Former Systems developer    Types: Snuff, Chew  . Alcohol use No  . Drug use: No  . Sexual activity: Not on file   Other Topics Concern  . Not on file   Social History Narrative  . No narrative on file    FAMILY HISTORY Family History  Problem Relation Age of Onset  . Heart attack Mother   . Heart disease Father   . Prostate cancer Neg Hx   .  Bladder Cancer Neg Hx   . Kidney cancer Neg Hx     ALLERGIES:  has No Known Allergies.  MEDICATIONS:  Current Outpatient Prescriptions  Medication Sig Dispense Refill  . acetaminophen (TYLENOL) 500 MG tablet Take 500 mg by mouth 2 (two) times daily as needed (for pain.).     Marland Kitchen amoxicillin (AMOXIL) 500 MG tablet   0  . aspirin EC 81 MG tablet Take 81 mg by mouth daily after breakfast.     . buPROPion (WELLBUTRIN SR) 100 MG 12 hr tablet TAKE ONE TABLET BY MOUTH EVERY DAY    . cyclobenzaprine (FLEXERIL) 10 MG tablet Take 10 mg by mouth at bedtime as needed for muscle spasms.    Marland Kitchen ezetimibe-simvastatin (VYTORIN) 10-40 MG tablet Take 1 tablet by mouth at bedtime.    Marland Kitchen HYDROcodone-acetaminophen (NORCO) 7.5-325 MG tablet Take 1 tablet by mouth every 4 (four) hours as needed  for moderate pain. 180 tablet 0  . 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    Marland Kitchen ketoconazole (NIZORAL) 2 % shampoo APPLY SHAMPOO TO HAIR AS NEEDED FOR FLAKY/ITCHY SCALP (TYPICALLY EVERY 2-3 DAYS)    . levothyroxine (SYNTHROID, LEVOTHROID) 100 MCG tablet Take 100 mcg by mouth daily before breakfast.    . lidocaine (LIDODERM) 5 % Place 1 patch onto the skin every 12 (twelve) hours. Remove & Discard patch within 12 hours or as directed by MD 10 patch 0  . lidocaine-prilocaine (EMLA) cream Apply to affected area once 30 g 3  . LORazepam (ATIVAN) 0.5 MG tablet Take 1 tablet (0.5 mg total) by mouth every 8 (eight) hours as needed for anxiety (nausea). 30 tablet 0  . Methylcellulose, Laxative, (CITRUCEL PO) Take 1 tablet by mouth daily after breakfast.    . morphine (MS CONTIN) 60 MG 12 hr tablet Take 1 tablet (60 mg total) by mouth every 12 (twelve) hours. 60 tablet 0  . Multiple Vitamin (MULTIVITAMIN WITH MINERALS) TABS tablet Take 1 tablet by mouth daily after breakfast. CENTRUM SILVER    . ondansetron (ZOFRAN) 8 MG tablet Take 1 tablet (8 mg total) by mouth 2 (two) times daily as needed for refractory nausea / vomiting. Start on day 3 after carboplatin chemo. 30 tablet 1  . PEPPERMINT OIL PO Take 1 capsule by mouth 3 (three) times daily as needed (for IBS). IBgard (active ingredient: 90 mg ultrapurified peppermint oil)    . polyethylene glycol powder (GLYCOLAX/MIRALAX) powder Take 17 g by mouth daily after breakfast. Mix with glass of water 255 g 1  . Probiotic Product (PROBIOTIC PO) Take 1 capsule by mouth daily as needed (for digestive health (see instructions)). AFTER BREAKFAST EITHER TAKE A PROBIOTIC OR EAT YOGURT    . prochlorperazine (COMPAZINE) 10 MG tablet Take 1 tablet (10 mg total) by mouth every 6 (six) hours as needed (Nausea or vomiting). 30 tablet 1  . promethazine  (PHENERGAN) 25 MG tablet Take 1 tablet (25 mg total) by mouth every 6 (six) hours as needed for nausea or vomiting. 30 tablet 0  . RABEprazole (ACIPHEX) 20 MG tablet Take 20 mg by mouth daily before breakfast.     . ramipril (ALTACE) 10 MG capsule Take 10 mg by mouth daily after breakfast.    . senna (SENOKOT) 8.6 MG TABS tablet Take 2 tablets (17.2 mg total) by mouth daily. 120 each 0  . sertraline (ZOLOFT) 100 MG  tablet Take 100 mg by mouth daily after breakfast.    . tamsulosin (FLOMAX) 0.4 MG CAPS capsule Take 0.8 mg by mouth daily after supper.      No current facility-administered medications for this visit.     PHYSICAL EXAMINATION:  ECOG PERFORMANCE STATUS: 2 - Symptomatic, <50% confined to bed   Vitals:   06/08/17 0915  BP: 113/77  Pulse: (!) 101  Resp: (!) 22  Temp: 97.8 F (36.6 C)    Filed Weights   06/08/17 0915  Weight: 158 lb (71.7 kg)     Physical Exam GENERAL: in distress,  Sitting in wheelchair. SKIN:  No rashes or significant lesions  HEAD: Normocephalic, No masses, lesions, tenderness or abnormalities  EYES: Conjunctiva are pale,  non icteric ENT: External ears normal ,lips , buccal mucosa, and tongue normal and mucous membranes are moist  LYMPH: No palpable cervical and axillary lymphadenopathy  LUNGS: Clear to auscultation, no crackles or wheezes HEART: Regular rate & rhythm, no murmurs, no gallops, S1 normal and S2 normal  ABDOMEN: Abdomen is distended, with normal bowel sounds MUSCULOSKELETAL: lumbar spine tenderness.  EXTREMITIES: No edema, no skin discoloration or tenderness NEURO: Alert & oriented, lower extremity strength 3/5 bilaterllay. Normal upper extremity strength.    LABORATORY DATA: I have personally reviewed the data as listed: CBC    Component Value Date/Time   WBC 4.6 06/08/2017 0900   RBC 4.10 (L) 06/08/2017 0900   HGB 11.5 (L) 06/08/2017 0900   HGB 15.3 11/15/2012 1641   HCT 34.2 (L) 06/08/2017 0900   HCT 43.7 11/15/2012  1641   PLT 111 (L) 06/08/2017 0900   PLT 166 11/15/2012 1641   MCV 83.3 06/08/2017 0900   MCV 89 11/15/2012 1641   MCH 28.1 06/08/2017 0900   MCHC 33.8 06/08/2017 0900   RDW 14.7 (H) 06/08/2017 0900   RDW 13.3 11/15/2012 1641   LYMPHSABS 1.1 06/08/2017 0900   LYMPHSABS 1.7 11/15/2012 1641   MONOABS 0.2 06/08/2017 0900   MONOABS 0.8 11/15/2012 1641   EOSABS 0.1 06/08/2017 0900   EOSABS 0.1 11/15/2012 1641   BASOSABS 0.0 06/08/2017 0900   BASOSABS 0.0 11/15/2012 1641   CMP Latest Ref Rng & Units 06/08/2017 06/01/2017 05/28/2017  Glucose 65 - 99 mg/dL 149(H) 156(H) 141(H)  BUN 6 - 20 mg/dL _0 Creatinine 0.61 - 1.24 mg/dL 1.19 1.32(H) 1.17  Sodium 135 - 145 mmol/L 135 136 136  Potassium 3.5 - 5.1 mmol/L 3.8 3.5 3.7  Chloride 101 - 111 mmol/L 97(L) 98(L) 99(L)  CO2 22 - 32 mmol/L _1 Calcium 8.9 - 10.3 mg/dL 9.5 9.6 9.5  Total Protein 6.5 - 8.1 g/dL 7.5 7.5 7.6  Total Bilirubin 0.3 - 1.2 mg/dL 0.7 0.9 0.8  Alkaline Phos 38 - 126 U/L 221(H) 179(H) 141(H)  AST 15 - 41 U/L 45(H) 46(H) 31  ALT 17 - 63 U/L 77(H) 67(H) 40     RADIOGRAPHIC STUDIES: I have personally reviewed the radiological images as listed and agree with the findings in the report CT chest and abdomen 05/03/2017 IMPRESSION: 1. Pneumoperitoneum with a small amount of ascites, gas tracking along the thoracoabdominal wall into the scrotum, and a small amount of gas in the right perirenal space-these findings are likely related to the patient's attempted nephroureterectomy yesterday. Surveillance to ensure expected resolution of the pneumoperitoneum might be appropriate. 2. The right kidney lower pole infarct with lack of patency of the more inferior of the 3 right  renal arteries. There is delayed nephrogram on the right with increase an infiltrative process in theright kidney and extending into the right renal hilar vascular structures, including the right renal vein and the adjacent IVC, compatible with tumor  thrombus. There is also soft tissue density surrounding the renal hilar vascular structures and extending into the retroperitoneum and periaortic region, some of which may be tumor and some of which may be hematoma.3. Demineralization of the anterior inferior L2 vertebral body,concerning for tumor invasion. This is near the level of the retroaortic left renal vein. 4. No findings of metastatic disease to the chest. There are new trace bilateral pleural effusions with mild passive atelectasis. 5.  Aortic Atherosclerosis (ICD10-I70.0).  Coronary atherosclerosis. 6. Inflammatory stranding from the right perirenal space extends down into the pelvis and crosses the midline. There is presacral edema. 7. 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 Recommend followup by ultrasound in 3 years. This recommendation follows ACR consensus guidelines: White Paper of the ACR Incidental Findings Committee II on Vascular Findings. J Am Coll Radiol 2013; 67:672-094 8. Wall thickening along the left side of the urinary bladder may be incidental, but synchronous sessile transitional cell carcinoma is not readily excluded.  CT abdomen wo contrast 02/09/2017  Increased size of 4.6 cm ill-defined hypoenhancing mass ininterpolar region of right kidney, with obstruction of upper pole collecting system. This does not have typical appearance for pyelonephritis or renal cell carcinoma, and raises suspicion for urothelial carcinoma. Consider ureteroscopy for further evaluation. No evidence of metastatic disease. Stable mildly enlarged prostate and mild diffuse bladder wall thickening. Stable 3.0 cm infrarenal abdominal aortic aneurysm. Recommend followup by ultrasound in 3 years. This recommendation follows ACR consensus guidelines: White Paper of the ACR Incidental Findings Committee II on Vascular Findings. [place] [phone]. Colonic diverticulosis. No radiographic evidence of  diverticulitis. Stable small fat-containing left inguinal hernia. MRI thoracic and lumbar w/wo contrast.  1. Malignant invasion of the L2 vertebral body by the adjacent retroperitoneal mass. No pathologic fracture or osseous metastatic disease elsewhere in the thoracolumbar spine. 2. Areas of epidural contrast enhancement of the lumbar spine are most consistent with a dilated epidural venous plexus secondary to invasion of the inferior vena cava by the patient's urothelial tumor. 3. Moderate L4-L5 multifactorial spinal canal stenosis with severe right and mild-to-moderate left neural foraminal stenosis. 4. Moderate right and severe left multifactorial L5-S1 neural foraminal stenosis. There is also severe attenuation of the thecal sac at this level but no bony spinal canal stenosis. 5. No spinal canal or neural foraminal stenosis in the thoracic spine.  Pathology 05/02/2017   SPECIMEN SUBMITTED:  A. Hilar tissue, right kidney  B. Hilar tissue, right kidney  DIAGNOSIS:  A and B. 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   Comment:  One fragment in part A contains an infiltrative pleomorphic neoplasm.  Immunohistochemistry (IHC) was performed for further characterization,  and the neoplastic cells are positive for cytokeratins (AE1/AE3/Cam5.2),  with diffuse strong staining. Scattered cells show weak to moderate  staining for GATA3, which supports the above diagnosis.   The tumor is infiltrating adipose tissue, and no nodal tissue is  identified. Clinical correlation is needed to determine if this  represents metastasis or direct extension from the kidney tumor.   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  Cancer Staging Transitional cell carcinoma of kidney, right [facility]) Staging form: Kidney, AJCC 8th Edition - Clinical stage from 05/08/2017: Stage IV (cT3, cNX, cM1) - Signed by Earlie Server, MD on 05/08/2017  1. 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 Transitional cell carcinoma of kidney, right ([place])   3. CKD (chronic kidney disease) stage 3, GFR 30-59 ml/min   4. Intractable vomiting with nausea, unspecified vomiting type    # S/P Cycle 1 Day 1 Gemcitabine and Carboplain. Hold Day 8 Gemcitabine due to intractable nausea and vomiting.   # Intractable nausea and vomiting: Plan to give IV zofran 56m x1 , dexamethasone 26mIVx1, Ativan 60m34m 1 and emend 130m29m1, 1L NS. Also will give low dose of Dilaudid 60mg 80mx1 for his pain at infusion center. If still not controlled, advise to go to ER for additional evaluation.  Outpatient antiemetics discussed with patient and his wife.  His chemotherapy is not high emetogenic, and this intractable N/V warrants further work up with MRI brain to rule out CNS involvement.   # Neoplasm related pain: conitnue OxyContin 40 mg twice a day until he finishes his current supply. He has about 6 tablets of 20mg 660montin left. Rx of MS contin 60mg B65mas given to wife and she will fill today. He can switch to MS contin 60mg [id number] runs out of currently supply.  I advise patient to use 2 tablets of Norco 7.5/32mg Q6h as needed to see if that helps the pain. Patient and his wife voices understanding.   3 :UTI. Urine culture positive for enterococcus faecalis. Patient still see blood in urine when he catheterizes and has lower abdomen discomfort. Already finished his antibiotic course. I gave him another course of antibotics with Levaquin.   # follow up with Rad[facility] to finish RT to L2 lesion for symptom relief.  # hold Xgeva Q28 days for  metastatic bone disease. Patient has poor dentition. Ask him to obtain dental clearance.    # called by RN from infusion center today that patient feels well after receiving above treatment and will be sent home.   Follow up 9/17 to evaluate if he is stable, nausea/vomiting controlled, plan gemcitabine.   Orders Placed This Encounter  Procedures  . MR Brain W Wo Contrast    Intractable nausea and vomiting. Rule out CNS involvement.    Standing Status:   Future    Standing Expiration Date:   06/08/2018    Order Specific Question:   If indicated for the ordered procedure, I authorize the administration of contrast media per Radiology protocol    Answer:   Yes    Order Specific Question:   What is the patient's sedation requirement?    Answer:   Anti-anxiety    Order Specific Question:   Does the patient have a pacemaker or implanted devices?    Answer:   No    Order Specific Question:   Radiology Contrast Protocol - do NOT remove file path    Answer:   [email or web address]    Order Specific Question:   [facility]    [facility]   All questions were answered. The patient knows to call the clinic with any problems, questions or concerns. Total face to face encounter time for  this patient visit was 40 min. >50% of the time was  spent in counseling and coordination of care.    Earlie Server, MD  06/08/2017 9:25 AM

## 2017-06-11 ENCOUNTER — Inpatient Hospital Stay: Payer: Medicare Other

## 2017-06-11 ENCOUNTER — Inpatient Hospital Stay (HOSPITAL_BASED_OUTPATIENT_CLINIC_OR_DEPARTMENT_OTHER): Payer: Medicare Other | Admitting: Oncology

## 2017-06-11 ENCOUNTER — Ambulatory Visit: Payer: Medicare Other

## 2017-06-11 ENCOUNTER — Ambulatory Visit: Admission: RE | Admit: 2017-06-11 | Payer: Medicare Other | Source: Ambulatory Visit

## 2017-06-11 ENCOUNTER — Encounter: Payer: Self-pay | Admitting: Oncology

## 2017-06-11 VITALS — BP 113/69 | HR 83 | Temp 95.9°F | Wt 152.2 lb

## 2017-06-11 DIAGNOSIS — Z5111 Encounter for antineoplastic chemotherapy: Secondary | ICD-10-CM | POA: Diagnosis not present

## 2017-06-11 DIAGNOSIS — G893 Neoplasm related pain (acute) (chronic): Secondary | ICD-10-CM

## 2017-06-11 DIAGNOSIS — R630 Anorexia: Secondary | ICD-10-CM

## 2017-06-11 DIAGNOSIS — C641 Malignant neoplasm of right kidney, except renal pelvis: Secondary | ICD-10-CM

## 2017-06-11 DIAGNOSIS — M48061 Spinal stenosis, lumbar region without neurogenic claudication: Secondary | ICD-10-CM

## 2017-06-11 DIAGNOSIS — Z7189 Other specified counseling: Secondary | ICD-10-CM

## 2017-06-11 DIAGNOSIS — Z906 Acquired absence of other parts of urinary tract: Secondary | ICD-10-CM | POA: Diagnosis not present

## 2017-06-11 DIAGNOSIS — K5730 Diverticulosis of large intestine without perforation or abscess without bleeding: Secondary | ICD-10-CM

## 2017-06-11 DIAGNOSIS — N319 Neuromuscular dysfunction of bladder, unspecified: Secondary | ICD-10-CM | POA: Diagnosis not present

## 2017-06-11 DIAGNOSIS — M5136 Other intervertebral disc degeneration, lumbar region: Secondary | ICD-10-CM

## 2017-06-11 DIAGNOSIS — N3281 Overactive bladder: Secondary | ICD-10-CM

## 2017-06-11 DIAGNOSIS — Z87891 Personal history of nicotine dependence: Secondary | ICD-10-CM

## 2017-06-11 DIAGNOSIS — N400 Enlarged prostate without lower urinary tract symptoms: Secondary | ICD-10-CM

## 2017-06-11 DIAGNOSIS — E039 Hypothyroidism, unspecified: Secondary | ICD-10-CM

## 2017-06-11 DIAGNOSIS — Z792 Long term (current) use of antibiotics: Secondary | ICD-10-CM

## 2017-06-11 DIAGNOSIS — N183 Chronic kidney disease, stage 3 unspecified: Secondary | ICD-10-CM

## 2017-06-11 DIAGNOSIS — T451X5S Adverse effect of antineoplastic and immunosuppressive drugs, sequela: Secondary | ICD-10-CM | POA: Diagnosis not present

## 2017-06-11 DIAGNOSIS — C679 Malignant neoplasm of bladder, unspecified: Secondary | ICD-10-CM

## 2017-06-11 DIAGNOSIS — K5900 Constipation, unspecified: Secondary | ICD-10-CM

## 2017-06-11 DIAGNOSIS — E785 Hyperlipidemia, unspecified: Secondary | ICD-10-CM

## 2017-06-11 DIAGNOSIS — R5383 Other fatigue: Secondary | ICD-10-CM

## 2017-06-11 DIAGNOSIS — N390 Urinary tract infection, site not specified: Secondary | ICD-10-CM

## 2017-06-11 DIAGNOSIS — R197 Diarrhea, unspecified: Secondary | ICD-10-CM

## 2017-06-11 DIAGNOSIS — Z7982 Long term (current) use of aspirin: Secondary | ICD-10-CM

## 2017-06-11 DIAGNOSIS — I714 Abdominal aortic aneurysm, without rupture: Secondary | ICD-10-CM

## 2017-06-11 DIAGNOSIS — R7303 Prediabetes: Secondary | ICD-10-CM

## 2017-06-11 DIAGNOSIS — C7951 Secondary malignant neoplasm of bone: Secondary | ICD-10-CM | POA: Diagnosis not present

## 2017-06-11 DIAGNOSIS — R140 Abdominal distension (gaseous): Secondary | ICD-10-CM

## 2017-06-11 DIAGNOSIS — M549 Dorsalgia, unspecified: Secondary | ICD-10-CM

## 2017-06-11 DIAGNOSIS — Z79899 Other long term (current) drug therapy: Secondary | ICD-10-CM

## 2017-06-11 DIAGNOSIS — Z905 Acquired absence of kidney: Secondary | ICD-10-CM

## 2017-06-11 DIAGNOSIS — R112 Nausea with vomiting, unspecified: Secondary | ICD-10-CM

## 2017-06-11 DIAGNOSIS — I700 Atherosclerosis of aorta: Secondary | ICD-10-CM

## 2017-06-11 DIAGNOSIS — T451X5A Adverse effect of antineoplastic and immunosuppressive drugs, initial encounter: Secondary | ICD-10-CM

## 2017-06-11 DIAGNOSIS — D6959 Other secondary thrombocytopenia: Secondary | ICD-10-CM | POA: Diagnosis not present

## 2017-06-11 DIAGNOSIS — K0889 Other specified disorders of teeth and supporting structures: Secondary | ICD-10-CM

## 2017-06-11 DIAGNOSIS — R531 Weakness: Secondary | ICD-10-CM

## 2017-06-11 DIAGNOSIS — J449 Chronic obstructive pulmonary disease, unspecified: Secondary | ICD-10-CM

## 2017-06-11 DIAGNOSIS — I2510 Atherosclerotic heart disease of native coronary artery without angina pectoris: Secondary | ICD-10-CM

## 2017-06-11 DIAGNOSIS — C651 Malignant neoplasm of right renal pelvis: Secondary | ICD-10-CM

## 2017-06-11 DIAGNOSIS — I509 Heart failure, unspecified: Secondary | ICD-10-CM

## 2017-06-11 LAB — COMPREHENSIVE METABOLIC PANEL
ALT: 47 U/L (ref 17–63)
AST: 27 U/L (ref 15–41)
Albumin: 3.4 g/dL — ABNORMAL LOW (ref 3.5–5.0)
Alkaline Phosphatase: 157 U/L — ABNORMAL HIGH (ref 38–126)
Anion gap: 10 (ref 5–15)
BUN: 14 mg/dL (ref 6–20)
CHLORIDE: 98 mmol/L — AB (ref 101–111)
CO2: 26 mmol/L (ref 22–32)
CREATININE: 1 mg/dL (ref 0.61–1.24)
Calcium: 9.2 mg/dL (ref 8.9–10.3)
GFR calc Af Amer: >60 mL/min (ref >60)
GFR calc non Af Amer: >60 mL/min (ref >60)
Glucose, Bld: 87 mg/dL (ref 65–99)
Potassium: 3.8 mmol/L (ref 3.5–5.1)
SODIUM: 134 mmol/L — AB (ref 135–145)
Total Bilirubin: 0.7 mg/dL (ref 0.3–1.2)
Total Protein: 6.9 g/dL (ref 6.5–8.1)

## 2017-06-11 LAB — CBC WITH DIFFERENTIAL/PLATELET
BASOS ABS: 0 10*3/uL (ref 0–0.1)
Basophils Relative: 0 %
EOS PCT: 4 %
Eosinophils Absolute: 0.2 10*3/uL (ref 0–0.7)
HCT: 31.8 % — ABNORMAL LOW (ref 40.0–52.0)
Hemoglobin: 10.8 g/dL — ABNORMAL LOW (ref 13.0–18.0)
LYMPHS PCT: 19 %
Lymphs Abs: 0.7 10*3/uL — ABNORMAL LOW (ref 1.0–3.6)
MCH: 28.5 pg (ref 26.0–34.0)
MCHC: 34.1 g/dL (ref 32.0–36.0)
MCV: 83.6 fL (ref 80.0–100.0)
Monocytes Absolute: 0.3 10*3/uL (ref 0.2–1.0)
Monocytes Relative: 8 %
Neutro Abs: 2.5 10*3/uL (ref 1.4–6.5)
Neutrophils Relative %: 69 %
PLATELETS: 69 10*3/uL — AB (ref 150–440)
RBC: 3.8 MIL/uL — AB (ref 4.40–5.90)
RDW: 15 % — ABNORMAL HIGH (ref 11.5–14.5)
WBC: 3.6 10*3/uL — AB (ref 3.8–10.6)

## 2017-06-11 MED ORDER — HEPARIN SOD (PORK) LOCK FLUSH 100 UNIT/ML IV SOLN
500.0000 [IU] | Freq: Once | INTRAVENOUS | Status: AC
  Administered 2017-06-11: 500 [IU] via INTRAVENOUS
  Filled 2017-06-11: qty 5

## 2017-06-11 MED ORDER — PALONOSETRON HCL INJECTION 0.25 MG/5ML
0.2500 mg | Freq: Once | INTRAVENOUS | Status: AC
  Administered 2017-06-11: 0.25 mg via INTRAVENOUS
  Filled 2017-06-11: qty 5

## 2017-06-11 MED ORDER — PREDNISONE 10 MG PO TABS: 10.0000 mg | ORAL_TABLET | Freq: Every day | ORAL | 0 refills | Status: DC

## 2017-06-11 MED ORDER — SODIUM CHLORIDE 0.9 % IV SOLN
Freq: Once | INTRAVENOUS | Status: AC
  Administered 2017-06-11: 11:00:00 via INTRAVENOUS
  Filled 2017-06-11: qty 1000

## 2017-06-11 MED ORDER — SODIUM CHLORIDE 0.9% FLUSH
10.0000 mL | INTRAVENOUS | Status: DC | PRN
  Administered 2017-06-11: 10 mL via INTRAVENOUS
  Filled 2017-06-11: qty 10

## 2017-06-11 MED ORDER — SODIUM CHLORIDE 0.9 % IV SOLN
1000.0000 mg | Freq: Once | INTRAVENOUS | Status: AC
  Administered 2017-06-11: 988.5932 mg via INTRAVENOUS
  Filled 2017-06-11: qty 26

## 2017-06-11 NOTE — Progress Notes (Signed)
Patient here today for follow up.   

## 2017-06-11 NOTE — Progress Notes (Signed)
[facility] Initial Visit:  Patient Care Team: [name], MD as PCP - General (Physician Assistant)  CHIEF COMPLAINTS/PURPOSE OF CONSULTATION: Locally advanced kidney urothelia cancer  HISTORY OF PRESENTING ILLNESS: Joseph Ritter 76 y.o. male with PMH listed as below  is referred to [place] for evaluation of locally advanced kidney urothelia cancer. Patient developed gross hematuria at the end of 2017. CT 08/2016 showed endophytic hypodense mass. Repeat image 01/2017 showed enlarged  endophytic hypodense mass right kidney. 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 Right robotic nephroureterectomy was attempted but aborted due to unresectable newly metastatic disease diagnosed intraoperatively. Postop he underwent a CT scan of the abdomen and pelvis which showed extension of an infiltrative mass from the right kidney along the right renal vein toward the IVC with encasement of the IVC.  Today patient is accompanied by his wife, sister and brother in law. 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 He self catheterize a few time a day.  He reports having back pain which limits her ability to walk. Also constipated and abdominal bloating. His appetite is fair. He takes Norco for abdominal and back pain.  He can walk with walker for very short distance, mostly limited by back pain and fatigue./weakness.   INTERVAL HISTORY  Patient is here for evaluation prior to delayed Day 8 of cycle 1 Carboplatin and Gemcitabine.Today is Day 32.  His nausea/vomiting has been better after receiving IV sterodis, IV emend, IV zofran and IV antivan on 06/08/2017.  Marland Kitchen  He has ran out his supply of Oxycontin 67m BID and has been switched to MS contin 655mBID. He took his first pill last night and had one episode of vomiting.    Review of Systems  Constitutional: Positive for appetite change and fatigue.  HENT:   Negative.   Eyes: Negative.   Respiratory: Negative.   Cardiovascular: Negative.   Gastrointestinal: Positive for abdominal distention, constipation and diarrhea.       Nausea vomiting is better  Endocrine: Negative.   Genitourinary: Positive for difficulty urinating.   Musculoskeletal: Positive for back pain.  Skin: Negative.     MEDICAL HISTORY: Past Medical History:  Diagnosis Date  . Acid indigestion 03/09/2014  . Adult hypothyroidism 11/18/2012  . Anxiety   . Arteriosclerosis of coronary artery 11/18/2012   Overview:  PATENT LIMA TO LAD, PATENT SVG TO PDA AND OCCULUDED SVG TO OM2 BY CATHERIZATION 02/09/2009   . 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 Borderline diabetes 05/25/2014  . BP (high blood pressure) 11/18/2012  . BPH (benign prostatic hypertrophy)   . CAFL (chronic airflow limitation) ([facility]8/04/2015  . CCF (congestive cardiac failure) ([facility]4/07/2014   Overview:  HX OF   . Clinical depression 05/03/2015  . COPD (chronic obstructive pulmonary disease) ([facility]  . DDD (degenerative disc disease), lumbar 06/29/2014  . 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 Gastric ulcer 03/09/2014  . 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 History of urinary self-catheterization 2017  . HLD (hyperlipidemia) 01/03/2014  . Incomplete bladder emptying 11/18/2012  . Leg weakness 11/18/2012  . Lumbar radiculitis   . Neuritis or radiculitis due to rupture of lumbar intervertebral disc 06/29/2014  . Overactive bladder   . PONV (postoperative nausea and vomiting)  years ago with Ether, no problem with Nausea or vomiting with the last few surgerys  . Pre-diabetes   . Subacute transverse myelitis ([place]) 11/21/2012  . Teeth problem    pt reports "bad teeth", "need to be pulled"    SURGICAL HISTORY: Past Surgical History:  Procedure Laterality Date  . ARTERY  BIOPSY Right 12/16/2015   Procedure: BIOPSY TEMPORAL ARTERY;  Surgeon: Algernon Huxley, MD;  Location: [facility];  Service: Vascular;  Laterality: Right;  . CARDIAC CATHETERIZATION    . CATARACT EXTRACTION W/PHACO Left 01/26/2016   Procedure: CATARACT EXTRACTION PHACO AND INTRAOCULAR LENS PLACEMENT (IOC) LEFT EYE;  Surgeon: Leandrew Koyanagi, MD;  Location: [facility];  Service: Ophthalmology;  Laterality: Left;  . CORONARY ARTERY BYPASS GRAFT  04/07/2002   [facility]  . Cysto Bladder Botox Injection  07/02/2014  . CYSTOSCOPY W/ RETROGRADES Right 03/16/2017   Procedure: CYSTOSCOPY WITH RETROGRADE PYELOGRAM;  Surgeon: [name], MD;  Location: [facility];  Service: Urology;  Laterality: Right;  . CYSTOSCOPY WITH STENT PLACEMENT Right 03/16/2017   Procedure: CYSTOSCOPY WITH STENT PLACEMENT;  Surgeon: [name], MD;  Location: [facility];  Service: Urology;  Laterality: Right;  . EYE SURGERY Left    blood clot behind left eye  . HAND SURGERY Bilateral   . HERNIA REPAIR Right    inguinal hernia repair  . LEFT HEART CATH AND CORS/GRAFTS ANGIOGRAPHY N/A 04/25/2017   Procedure: Left Heart Cath and Cors/Grafts Angiography;  Surgeon: Isaias Cowman, MD;  Location: [facility];  Service: Cardiovascular;  Laterality: N/A;  . PORTA CATH INSERTION N/A 05/30/2017   Procedure: Glori Luis Cath Insertion;  Surgeon: Katha Cabal, MD;  Location: [facility];  Service: Cardiovascular;  Laterality: N/A;  . ROBOT ASSITED LAPAROSCOPIC NEPHROURETERECTOMY Right 05/02/2017   Procedure: ROBOT ASSITED LAPAROSCOPIC NEPHROURETERECTOMY ATTEMPTED;  Surgeon: [name], MD;  Location: [facility];  Service: Urology;  Laterality: Right;  . URETEROSCOPY Right 03/16/2017   Procedure: URETEROSCOPY BIOPSY RENAL MASS;  Surgeon: [name], MD;  Location: [facility];  Service: Urology;  Laterality: Right;    SOCIAL HISTORY: Social History   Social History  . Marital status: Married     Spouse name: N/A  . Number of children: N/A  . Years of education: N/A   Occupational History  . Not on file.   Social History Main Topics  . Smoking status: Former Smoker    Types: Cigarettes    Quit date: 04/07/2002  . Smokeless tobacco: Former Systems developer    Types: Snuff, Chew  . Alcohol use No  . Drug use: No  . Sexual activity: Not on file   Other Topics Concern  . Not on file   Social History Narrative  . No narrative on file    FAMILY HISTORY Family History  Problem Relation Age of Onset  . Heart attack Mother   . Heart disease Father   . Prostate cancer Neg Hx   . Bladder Cancer Neg Hx   . Kidney cancer Neg Hx     ALLERGIES:  has No Known Allergies.  MEDICATIONS:  Current Outpatient Prescriptions  Medication Sig Dispense Refill  . acetaminophen (TYLENOL) 500 MG tablet Take 500 mg by mouth 2 (two) times daily as needed (for pain.).     Marland Kitchen aspirin EC 81 MG tablet Take 81 mg by mouth daily after breakfast.     . buPROPion (WELLBUTRIN SR) 100 MG 12 hr tablet TAKE ONE TABLET BY MOUTH  EVERY DAY    . cyclobenzaprine (FLEXERIL) 10 MG tablet Take 10 mg by mouth at bedtime as needed for muscle spasms.    Marland Kitchen ezetimibe-simvastatin (VYTORIN) 10-40 MG tablet Take 1 tablet by mouth at bedtime.    Marland Kitchen HYDROcodone-acetaminophen (NORCO) 7.5-325 MG tablet Take 1 tablet by mouth every 4 (four) hours as needed for moderate pain. 180 tablet 0  . 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    Marland Kitchen ketoconazole (NIZORAL) 2 % shampoo APPLY SHAMPOO TO HAIR AS NEEDED FOR FLAKY/ITCHY SCALP (TYPICALLY EVERY 2-3 DAYS)    . levofloxacin (LEVAQUIN) 500 MG tablet Take 1 tablet (500 mg total) by mouth daily. 10 tablet 0  . levothyroxine (SYNTHROID, LEVOTHROID) 100 MCG tablet Take 100 mcg by mouth daily before breakfast.    . lidocaine (LIDODERM) 5 % Place 1 patch onto the skin every 12 (twelve)  hours. Remove & Discard patch within 12 hours or as directed by MD (Patient not taking: Reported on 06/08/2017) 10 patch 0  . lidocaine-prilocaine (EMLA) cream Apply to affected area once 30 g 3  . LORazepam (ATIVAN) 0.5 MG tablet Take 1 tablet (0.5 mg total) by mouth every 8 (eight) hours as needed for anxiety (nausea). 30 tablet 0  . Methylcellulose, Laxative, (CITRUCEL PO) Take 1 tablet by mouth daily after breakfast.    . Multiple Vitamin (MULTIVITAMIN WITH MINERALS) TABS tablet Take 1 tablet by mouth daily after breakfast. CENTRUM SILVER    . ondansetron (ZOFRAN) 8 MG tablet Take 1 tablet (8 mg total) by mouth 2 (two) times daily as needed for refractory nausea / vomiting. Start on day 3 after carboplatin chemo. 30 tablet 1  . Oxycodone HCl 20 MG TABS Take 2 tablets by mouth every 12 (twelve) hours.    Marland Kitchen PEPPERMINT OIL PO Take 1 capsule by mouth 3 (three) times daily as needed (for IBS). IBgard (active ingredient: 90 mg ultrapurified peppermint oil)    . polyethylene glycol powder (GLYCOLAX/MIRALAX) powder Take 17 g by mouth daily after breakfast. Mix with glass of water 255 g 1  . Probiotic Product (PROBIOTIC PO) Take 1 capsule by mouth daily as needed (for digestive health (see instructions)). AFTER BREAKFAST EITHER TAKE A PROBIOTIC OR EAT YOGURT    . prochlorperazine (COMPAZINE) 10 MG tablet Take 1 tablet (10 mg total) by mouth every 6 (six) hours as needed (Nausea or vomiting). 30 tablet 1  . promethazine (PHENERGAN) 25 MG tablet Take 1 tablet (25 mg total) by mouth every 6 (six) hours as needed for nausea or vomiting. 30 tablet 0  . RABEprazole (ACIPHEX) 20 MG tablet Take 20 mg by mouth daily before breakfast.     . ramipril (ALTACE) 10 MG capsule Take 10 mg by mouth daily after breakfast.    . senna (SENOKOT) 8.6 MG TABS tablet Take 2 tablets (17.2 mg total) by mouth daily. 120 each 0  . sertraline (ZOLOFT) 100 MG tablet Take 100 mg by mouth daily after breakfast.    . tamsulosin (FLOMAX)  0.4 MG CAPS capsule Take 0.8 mg by mouth daily after supper.      No current facility-administered medications for this visit.    Facility-Administered Medications Ordered in Other Visits  Medication Dose Route Frequency Provider Last Rate Last Dose  . heparin lock flush 100 unit/mL  500 Units Intravenous Once Earlie Server, MD      . sodium  chloride flush (NS) 0.9 % injection 10 mL  10 mL Intravenous PRN Earlie Server, MD   10 mL at 06/11/17 0832    PHYSICAL EXAMINATION:  ECOG PERFORMANCE STATUS: 2 - Symptomatic, <50% confined to bed   Vitals:   06/11/17 1022  BP: 113/69  Pulse: 83  Temp: (!) 95.9 F (35.5 C)    Filed Weights   06/11/17 1022  Weight: 152 lb 4 oz (69.1 kg)     Physical Exam GENERAL:not in distress,  Sitting in wheelchair. SKIN:  No rashes or significant lesions  HEAD: Normocephalic, No masses, lesions, tenderness or abnormalities  EYES: Conjunctiva are pale,  non icteric ENT: External ears normal ,lips , buccal mucosa, and tongue normal and mucous membranes are moist  LYMPH: No palpable cervical and axillary lymphadenopathy  LUNGS: Clear to auscultation, no crackles or wheezes HEART: Regular rate & rhythm, no murmurs, no gallops, S1 normal and S2 normal  ABDOMEN: Abdomen is distended, with normal bowel sounds MUSCULOSKELETAL: lumbar spine tenderness.  EXTREMITIES: No edema, no skin discoloration or tenderness NEURO: Alert & oriented, lower extremity strength 3/5 bilaterllay. Normal upper extremity strength.    LABORATORY DATA: I have personally reviewed the data as listed: CBC    Component Value Date/Time   WBC 3.6 (L) 06/11/2017 0832   RBC 3.80 (L) 06/11/2017 0832   HGB 10.8 (L) 06/11/2017 0832   HGB 15.3 11/15/2012 1641   HCT 31.8 (L) 06/11/2017 0832   HCT 43.7 11/15/2012 1641   PLT 69 (L) 06/11/2017 0832   PLT 166 11/15/2012 1641   MCV 83.6 06/11/2017 0832   MCV 89 11/15/2012 1641   MCH 28.5 06/11/2017 0832   MCHC 34.1 06/11/2017 0832   RDW 15.0  (H) 06/11/2017 0832   RDW 13.3 11/15/2012 1641   LYMPHSABS 0.7 (L) 06/11/2017 0832   LYMPHSABS 1.7 11/15/2012 1641   MONOABS 0.3 06/11/2017 0832   MONOABS 0.8 11/15/2012 1641   EOSABS 0.2 06/11/2017 0832   EOSABS 0.1 11/15/2012 1641   BASOSABS 0.0 06/11/2017 0832   BASOSABS 0.0 11/15/2012 1641   CMP Latest Ref Rng & Units 06/08/2017 06/01/2017 05/28/2017  Glucose 65 - 99 mg/dL 149(H) 156(H) 141(H)  BUN 6 - 20 mg/dL 19 16 13   Creatinine 0.61 - 1.24 mg/dL 1.19 1.32(H) 1.17  Sodium 135 - 145 mmol/L 135 136 136  Potassium 3.5 - 5.1 mmol/L 3.8 3.5 3.7  Chloride 101 - 111 mmol/L 97(L) 98(L) 99(L)  CO2 22 - 32 mmol/L 25 28 26   Calcium 8.9 - 10.3 mg/dL 9.5 9.6 9.5  Total Protein 6.5 - 8.1 g/dL 7.5 7.5 7.6  Total Bilirubin 0.3 - 1.2 mg/dL 0.7 0.9 0.8  Alkaline Phos 38 - 126 U/L 221(H) 179(H) 141(H)  AST 15 - 41 U/L 45(H) 46(H) 31  ALT 17 - 63 U/L 77(H) 67(H) 40     RADIOGRAPHIC STUDIES: I have personally reviewed the radiological images as listed and agree with the findings in the report CT chest and abdomen 05/03/2017 IMPRESSION: 1. Pneumoperitoneum with a small amount of ascites, gas tracking along the thoracoabdominal wall into the scrotum, and a small amount of gas in the right perirenal space-these findings are likely related to the patient's attempted nephroureterectomy yesterday. Surveillance to ensure expected resolution of the pneumoperitoneum might be appropriate. 2. The right kidney lower pole infarct with lack of patency of the more inferior of the 3 right renal arteries. There is delayed nephrogram on the right with increase an infiltrative process in theright kidney  and extending into the right renal hilar vascular structures, including the right renal vein and the adjacent IVC, compatible with tumor thrombus. There is also soft tissue density surrounding the renal hilar vascular structures and extending into the retroperitoneum and periaortic region, some of which may be tumor  and some of which may be hematoma.3. Demineralization of the anterior inferior L2 vertebral body,concerning for tumor invasion. This is near the level of the retroaortic left renal vein. 4. No findings of metastatic disease to the chest. There are new trace bilateral pleural effusions with mild passive atelectasis. 5.  Aortic Atherosclerosis (ICD10-I70.0).  Coronary atherosclerosis. 6. Inflammatory stranding from the right perirenal space extends down into the pelvis and crosses the midline. There is presacral edema. 7. 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 Recommend followup by ultrasound in 3 years. This recommendation follows ACR consensus guidelines: White Paper of the ACR Incidental Findings Committee II on Vascular Findings. J Am Coll Radiol 2013; 31:540-086 8. Wall thickening along the left side of the urinary bladder may be incidental, but synchronous sessile transitional cell carcinoma is not readily excluded.  CT abdomen wo contrast 02/09/2017  Increased size of 4.6 cm ill-defined hypoenhancing mass ininterpolar region of right kidney, with obstruction of upper pole collecting system. This does not have typical appearance for pyelonephritis or renal cell carcinoma, and raises suspicion for urothelial carcinoma. Consider ureteroscopy for further evaluation. No evidence of metastatic disease. Stable mildly enlarged prostate and mild diffuse bladder wall thickening. Stable 3.0 cm infrarenal abdominal aortic aneurysm. Recommend followup by ultrasound in 3 years. This recommendation follows ACR consensus guidelines: White Paper of the ACR Incidental Findings Committee II on Vascular Findings. [place] [phone]. Colonic diverticulosis. No radiographic evidence of diverticulitis. Stable small fat-containing left inguinal hernia. MRI thoracic and lumbar w/wo contrast.  1. Malignant invasion of the L2 vertebral body by the adjacent retroperitoneal  mass. No pathologic fracture or osseous metastatic disease elsewhere in the thoracolumbar spine. 2. Areas of epidural contrast enhancement of the lumbar spine are most consistent with a dilated epidural venous plexus secondary to invasion of the inferior vena cava by the patient's urothelial tumor. 3. Moderate L4-L5 multifactorial spinal canal stenosis with severe right and mild-to-moderate left neural foraminal stenosis. 4. Moderate right and severe left multifactorial L5-S1 neural foraminal stenosis. There is also severe attenuation of the thecal sac at this level but no bony spinal canal stenosis. 5. No spinal canal or neural foraminal stenosis in the thoracic spine.  Pathology 05/02/2017   SPECIMEN SUBMITTED:  A. Hilar tissue, right kidney  B. Hilar tissue, right kidney  DIAGNOSIS:  A and B. 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   Comment:  One fragment in part A contains an infiltrative pleomorphic neoplasm.  Immunohistochemistry (IHC) was performed for further characterization,  and the neoplastic cells are positive for cytokeratins (AE1/AE3/Cam5.2),  with diffuse strong staining. Scattered cells show weak to moderate  staining for GATA3, which supports the above diagnosis.   The tumor is infiltrating adipose tissue, and no nodal tissue is  identified. Clinical correlation is needed to determine if this  represents metastasis or direct extension from the kidney tumor.   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  Cancer Staging Transitional cell carcinoma of kidney, right  [facility]) Staging form: Kidney, AJCC 8th Edition - Clinical stage from 05/08/2017: Stage IV (cT3, cNX, cM1) - Signed by Earlie Server, MD on 05/08/2017  1. Transitional cell carcinoma of kidney, right ([place])   2. 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 CKD (chronic kidney disease) stage 3, GFR 30-59 ml/min   4. Intractable vomiting with nausea, unspecified vomiting type   5. Goals of care, counseling/discussion   6. Encounter for antineoplastic chemotherapy   7. Chemotherapy-induced thrombocytopenia    # S/P Cycle 1 Day 1 Gemcitabine and Carboplain.  Day 8 Gemcitabine was delayed due to intractable nausea and vomiting.  Chemotherapy related thrombocytopenia, no active bleeding. OK to proceed with 50% dose of Gemcitabine today. (day 11)  # Intractable nausea and vomiting: improved. Outpatient antiemetics discussed with patient and his wife. She has phenergan PRN, zofran PRN, compazine PRN and ativan PRN.  His chemotherapy is not extremely high emetogenic, and this intractable N/V warrants further work up with MRI brain to rule out CNS involvement (scheduled).    # Neoplasm related pain: Continue MS contin 86m BID and  use 2 tablets of Norco 7.5/325mg Q6h as needed to see if that helps the pain. Patient and his wife voices understanding. Advise patient to take antiemetics before taking MS contin. Start low dose prednisone 133mdaily to palliate the bone pain.    # :UTI. Urine culture positive for enterococcus faecalis. To finish course of antibotics with Levaquin x 10 days.   # follow up with RadOnc to finish RT to L2 lesion for symptom relief.  # hold Xgeva Q28 days for metastatic bone disease. Patient has poor dentition. Ask him to obtain dental clearance.    # Discussed with patient and his wife that treatment is with palliative intent and discussed about health power of attorney/liver will.   Labs with type and screen, cbc on 9/20, Follow up 9/24 to evaluate if he is stable,  nausea/vomiting controlled Cycle 2 treatment plan on 06/25/2017. Added Emend as premed.    Orders Placed This Encounter  Procedures  . CBC with Differential/Platelet    Standing Status:   Future    Standing Expiration Date:   06/11/2018  . Sample to Blood Bank    Standing Status:   Future    Standing Expiration Date:   06/11/2018   All questions were answered. The patient knows to call the clinic with any problems, questions or concerns. Total face to face encounter time for this patient visit was 40 min. >50% of the time was  spent in counseling and coordination of care.    ZhEarlie ServerMD  06/11/2017 8:50 AM

## 2017-06-11 NOTE — Progress Notes (Signed)
Reviewed labs including platelets with Dr. Tasia Catchings, proceed with reduced treatment today per Dr. Tasia Catchings. Roni Bread

## 2017-06-12 ENCOUNTER — Ambulatory Visit: Payer: Medicare Other

## 2017-06-13 ENCOUNTER — Ambulatory Visit: Payer: Medicare Other

## 2017-06-13 ENCOUNTER — Ambulatory Visit
Admission: RE | Admit: 2017-06-13 | Discharge: 2017-06-13 | Disposition: A | Discharge status: Home / Self Care | Payer: Medicare Other | Source: Ambulatory Visit | Attending: Radiation Oncology | Admitting: Radiation Oncology

## 2017-06-13 ENCOUNTER — Ambulatory Visit: Admission: RE | Admit: 2017-06-13 | Payer: Medicare Other | Source: Ambulatory Visit

## 2017-06-13 DIAGNOSIS — C7951 Secondary malignant neoplasm of bone: Secondary | ICD-10-CM | POA: Diagnosis not present

## 2017-06-14 ENCOUNTER — Ambulatory Visit: Payer: Medicare Other

## 2017-06-14 ENCOUNTER — Inpatient Hospital Stay: Payer: Medicare Other

## 2017-06-14 ENCOUNTER — Inpatient Hospital Stay: Payer: Medicare Other | Admitting: *Deleted

## 2017-06-14 ENCOUNTER — Other Ambulatory Visit: Payer: Self-pay

## 2017-06-14 ENCOUNTER — Inpatient Hospital Stay (HOSPITAL_BASED_OUTPATIENT_CLINIC_OR_DEPARTMENT_OTHER): Payer: Medicare Other | Admitting: Oncology

## 2017-06-14 VITALS — BP 135/67 | HR 76 | Temp 97.0°F | Resp 18

## 2017-06-14 DIAGNOSIS — D649 Anemia, unspecified: Secondary | ICD-10-CM

## 2017-06-14 DIAGNOSIS — D6959 Other secondary thrombocytopenia: Secondary | ICD-10-CM

## 2017-06-14 DIAGNOSIS — T451X5A Adverse effect of antineoplastic and immunosuppressive drugs, initial encounter: Secondary | ICD-10-CM

## 2017-06-14 DIAGNOSIS — C7951 Secondary malignant neoplasm of bone: Secondary | ICD-10-CM

## 2017-06-14 DIAGNOSIS — Z7982 Long term (current) use of aspirin: Secondary | ICD-10-CM

## 2017-06-14 DIAGNOSIS — Z7189 Other specified counseling: Secondary | ICD-10-CM

## 2017-06-14 DIAGNOSIS — N183 Chronic kidney disease, stage 3 unspecified: Secondary | ICD-10-CM

## 2017-06-14 DIAGNOSIS — N3281 Overactive bladder: Secondary | ICD-10-CM

## 2017-06-14 DIAGNOSIS — Z87891 Personal history of nicotine dependence: Secondary | ICD-10-CM

## 2017-06-14 DIAGNOSIS — Z906 Acquired absence of other parts of urinary tract: Secondary | ICD-10-CM

## 2017-06-14 DIAGNOSIS — K0889 Other specified disorders of teeth and supporting structures: Secondary | ICD-10-CM

## 2017-06-14 DIAGNOSIS — I714 Abdominal aortic aneurysm, without rupture: Secondary | ICD-10-CM

## 2017-06-14 DIAGNOSIS — R7303 Prediabetes: Secondary | ICD-10-CM

## 2017-06-14 DIAGNOSIS — R531 Weakness: Secondary | ICD-10-CM

## 2017-06-14 DIAGNOSIS — R5383 Other fatigue: Secondary | ICD-10-CM | POA: Diagnosis not present

## 2017-06-14 DIAGNOSIS — E871 Hypo-osmolality and hyponatremia: Secondary | ICD-10-CM | POA: Diagnosis not present

## 2017-06-14 DIAGNOSIS — K5730 Diverticulosis of large intestine without perforation or abscess without bleeding: Secondary | ICD-10-CM

## 2017-06-14 DIAGNOSIS — R5381 Other malaise: Secondary | ICD-10-CM | POA: Diagnosis not present

## 2017-06-14 DIAGNOSIS — Z905 Acquired absence of kidney: Secondary | ICD-10-CM

## 2017-06-14 DIAGNOSIS — E039 Hypothyroidism, unspecified: Secondary | ICD-10-CM

## 2017-06-14 DIAGNOSIS — R630 Anorexia: Secondary | ICD-10-CM

## 2017-06-14 DIAGNOSIS — N400 Enlarged prostate without lower urinary tract symptoms: Secondary | ICD-10-CM

## 2017-06-14 DIAGNOSIS — M549 Dorsalgia, unspecified: Secondary | ICD-10-CM

## 2017-06-14 DIAGNOSIS — D696 Thrombocytopenia, unspecified: Secondary | ICD-10-CM

## 2017-06-14 DIAGNOSIS — I2510 Atherosclerotic heart disease of native coronary artery without angina pectoris: Secondary | ICD-10-CM

## 2017-06-14 DIAGNOSIS — Z5111 Encounter for antineoplastic chemotherapy: Secondary | ICD-10-CM | POA: Diagnosis not present

## 2017-06-14 DIAGNOSIS — C679 Malignant neoplasm of bladder, unspecified: Secondary | ICD-10-CM

## 2017-06-14 DIAGNOSIS — C651 Malignant neoplasm of right renal pelvis: Secondary | ICD-10-CM

## 2017-06-14 DIAGNOSIS — R140 Abdominal distension (gaseous): Secondary | ICD-10-CM

## 2017-06-14 DIAGNOSIS — I509 Heart failure, unspecified: Secondary | ICD-10-CM

## 2017-06-14 DIAGNOSIS — R112 Nausea with vomiting, unspecified: Secondary | ICD-10-CM

## 2017-06-14 DIAGNOSIS — J449 Chronic obstructive pulmonary disease, unspecified: Secondary | ICD-10-CM

## 2017-06-14 DIAGNOSIS — C641 Malignant neoplasm of right kidney, except renal pelvis: Secondary | ICD-10-CM

## 2017-06-14 DIAGNOSIS — M48061 Spinal stenosis, lumbar region without neurogenic claudication: Secondary | ICD-10-CM

## 2017-06-14 DIAGNOSIS — I700 Atherosclerosis of aorta: Secondary | ICD-10-CM

## 2017-06-14 DIAGNOSIS — E785 Hyperlipidemia, unspecified: Secondary | ICD-10-CM

## 2017-06-14 DIAGNOSIS — Z79899 Other long term (current) drug therapy: Secondary | ICD-10-CM

## 2017-06-14 DIAGNOSIS — M5136 Other intervertebral disc degeneration, lumbar region: Secondary | ICD-10-CM

## 2017-06-14 LAB — CBC WITH DIFFERENTIAL/PLATELET
BASOS PCT: 0 %
Basophils Absolute: 0 10*3/uL (ref 0–0.1)
EOS ABS: 0.2 10*3/uL (ref 0–0.7)
Eosinophils Relative: 5 %
HCT: 33.5 % — ABNORMAL LOW (ref 40.0–52.0)
HEMOGLOBIN: 11.5 g/dL — AB (ref 13.0–18.0)
LYMPHS ABS: 0.5 10*3/uL — AB (ref 1.0–3.6)
Lymphocytes Relative: 14 %
MCH: 28.5 pg (ref 26.0–34.0)
MCHC: 34.4 g/dL (ref 32.0–36.0)
MCV: 82.8 fL (ref 80.0–100.0)
MONO ABS: 0 10*3/uL — AB (ref 0.2–1.0)
MONOS PCT: 1 %
NEUTROS PCT: 80 %
Neutro Abs: 2.8 10*3/uL (ref 1.4–6.5)
Platelets: 81 10*3/uL — ABNORMAL LOW (ref 150–440)
RBC: 4.05 MIL/uL — ABNORMAL LOW (ref 4.40–5.90)
RDW: 15.6 % — AB (ref 11.5–14.5)
WBC: 3.6 10*3/uL — ABNORMAL LOW (ref 3.8–10.6)

## 2017-06-14 LAB — COMPREHENSIVE METABOLIC PANEL
ALBUMIN: 3.4 g/dL — AB (ref 3.5–5.0)
ALK PHOS: 133 U/L — AB (ref 38–126)
ALT: 70 U/L — AB (ref 17–63)
AST: 50 U/L — AB (ref 15–41)
Anion gap: 11 (ref 5–15)
BUN: 21 mg/dL — AB (ref 6–20)
CALCIUM: 9.4 mg/dL (ref 8.9–10.3)
CHLORIDE: 95 mmol/L — AB (ref 101–111)
CO2: 28 mmol/L (ref 22–32)
CREATININE: 1.04 mg/dL (ref 0.61–1.24)
GFR calc Af Amer: >60 mL/min (ref >60)
GFR calc non Af Amer: >60 mL/min (ref >60)
GLUCOSE: 118 mg/dL — AB (ref 65–99)
Potassium: 4 mmol/L (ref 3.5–5.1)
SODIUM: 134 mmol/L — AB (ref 135–145)
Total Bilirubin: 0.8 mg/dL (ref 0.3–1.2)
Total Protein: 7.1 g/dL (ref 6.5–8.1)

## 2017-06-14 LAB — TYPE AND SCREEN
ABO/RH(D): O NEG
ANTIBODY SCREEN: NEGATIVE

## 2017-06-14 MED ORDER — SODIUM CHLORIDE 0.9 % IV SOLN
Freq: Once | INTRAVENOUS | Status: AC
  Administered 2017-06-14: 12:00:00 via INTRAVENOUS
  Filled 2017-06-14: qty 4

## 2017-06-14 MED ORDER — PROMETHAZINE HCL 25 MG PO TABS: 25.0000 mg | ORAL_TABLET | Freq: Four times a day (QID) | ORAL | 0 refills | Status: DC | PRN

## 2017-06-14 MED ORDER — HEPARIN SOD (PORK) LOCK FLUSH 100 UNIT/ML IV SOLN
500.0000 [IU] | Freq: Once | INTRAVENOUS | Status: AC
  Administered 2017-06-14: 500 [IU] via INTRAVENOUS

## 2017-06-14 MED ORDER — SODIUM CHLORIDE 0.9 % IV SOLN
INTRAVENOUS | Status: DC
  Administered 2017-06-14: 12:00:00 via INTRAVENOUS
  Filled 2017-06-14 (×2): qty 1000

## 2017-06-14 MED ORDER — HEPARIN SOD (PORK) LOCK FLUSH 100 UNIT/ML IV SOLN
INTRAVENOUS | Status: AC
  Filled 2017-06-14: qty 5

## 2017-06-14 MED ORDER — SODIUM CHLORIDE 0.9% FLUSH
10.0000 mL | Freq: Once | INTRAVENOUS | Status: AC
  Administered 2017-06-14: 10 mL via INTRAVENOUS
  Filled 2017-06-14: qty 10

## 2017-06-14 NOTE — Progress Notes (Signed)
Patient here today as acute add on for symptom management, reports nausea and vomiting that started yesterday. Patient also reports his pain is currently 7/10 in his lower back.

## 2017-06-14 NOTE — Progress Notes (Signed)
Symptom Management Consult note [facility]  [phone] [phone]  Patient Care Team: [name], MD as PCP - General (Physician Assistant)   Name of the patient: Joseph Ritter  [id number]  23-Sep-1941   Date of visit: 06/14/17  Diagnosis- Locally advanced kidney urothelia cancer  Chief complaint/ Reason for visit- Nausea/Vomiting/Weakness  Heme/Onc history: Joseph Ritter 76 y.o. male with PMH listed as below  is referred to [place] for evaluation of locally advanced kidney urothelia cancer. Patient developed gross hematuria at the end of 2017. CT 08/2016 showed endophytic hypodense mass. Repeat image 01/2017 showed enlarged  endophytic hypodense mass right kidney. 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 Right robotic nephroureterectomy was attempted but aborted due to unresectable newly metastatic disease diagnosed intraoperatively. Postop he underwent a CT scan of the abdomen and pelvis which showed extension of an infiltrative mass from the right kidney along the right renal vein toward the IVC with encasement of the IVC.  Today patient is accompanied by his wife, sister and brother in law. 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 He self catheterize a few time a day.  He reports having back pain which limits her ability to walk. Also constipated and abdominal bloating. His appetite is fair. He takes Norco for abdominal and back pain. He can walk with walker for very short distance, mostly limited by back pain and fatigue./weakness.  Interval history- Patient was last seen by Dr. Tasia Catchings on 06/11/2017 for day 11 of gemcitabine. At that time he was having nausea and vomiting that resolved after receiving IV steroids and antiemetics. He was given Phenergan, Zofran, Compazine and Ativan to take when needed. Since then his wife has been managing his pain  medications and anti-emetics. He continues to vomit daily but states it was better until this morning. He states this morning he has been vomiting constantly. He has not eaten anything. He admits to taking pain medication and anti-emetics on an empty stomach. He has been unable to eat well since beginning chemotherapy. He is scheduled for an MRI on Saturday to rule out CNS involvement. He complains of lower back pain and states it is worse today. He is unsure of how much pain medicine he received due to vomiting. He is currently taking prednisone 10 mg daily as prescribed for his back pain. He has continued his antibiotics for his positive urine culture. He is currently receiving radiation to L2 lesion. He continues to feel weak.   ECOG FS:1 - Symptomatic but completely ambulatory  Review of systems- Review of Systems  Constitutional: Positive for malaise/fatigue. Negative for chills and fever.  HENT: Negative.   Eyes: Negative.   Respiratory: Negative.  Negative for cough, sputum production and shortness of breath.   Cardiovascular: Negative for chest pain, palpitations and leg swelling.  Gastrointestinal: Positive for nausea and vomiting. Negative for constipation and diarrhea.  Genitourinary: Negative.   Musculoskeletal: Positive for back pain.  Skin: Negative.   Neurological: Positive for weakness. Negative for dizziness and headaches.  Endo/Heme/Allergies: Negative.   Psychiatric/Behavioral: Negative.      Current treatment- S/p Cycle 1 Gemcitabine and Carbo  No Known Allergies   Past Medical History:  Diagnosis Date  . Acid indigestion 03/09/2014  . Adult hypothyroidism 11/18/2012  . Anxiety   . Arteriosclerosis of coronary artery 11/18/2012   Overview:  PATENT LIMA TO LAD, PATENT SVG TO PDA AND OCCULUDED SVG  TO OM2 BY CATHERIZATION 02/09/2009   . 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 Borderline diabetes 05/25/2014  . BP (high blood pressure)  11/18/2012  . BPH (benign prostatic hypertrophy)   . CAFL (chronic airflow limitation) ([place]) 05/03/2015  . CCF (congestive cardiac failure) ([place]) 01/03/2014   Overview:  HX OF   . Clinical depression 05/03/2015  . COPD (chronic obstructive pulmonary disease) ([place])   . DDD (degenerative disc disease), lumbar 06/29/2014  . 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 Gastric ulcer 03/09/2014  . 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 History of urinary self-catheterization 2017  . HLD (hyperlipidemia) 01/03/2014  . Incomplete bladder emptying 11/18/2012  . Leg weakness 11/18/2012  . Lumbar radiculitis   . Neuritis or radiculitis due to rupture of lumbar intervertebral disc 06/29/2014  . Overactive bladder   . PONV (postoperative nausea and vomiting)    years ago with Ether, no problem with Nausea or vomiting with the last few surgerys  . Pre-diabetes   . Subacute transverse myelitis ([place]) 11/21/2012  . Teeth problem    pt reports "bad teeth", "need to be pulled"     Past Surgical History:  Procedure Laterality Date  . ARTERY BIOPSY Right 12/16/2015   Procedure: BIOPSY TEMPORAL ARTERY;  Surgeon: Algernon Huxley, MD;  Location: [facility];  Service: Vascular;  Laterality: Right;  . CARDIAC CATHETERIZATION    . CATARACT EXTRACTION W/PHACO Left 01/26/2016   Procedure: CATARACT EXTRACTION PHACO AND INTRAOCULAR LENS PLACEMENT (IOC) LEFT EYE;  Surgeon: Leandrew Koyanagi, MD;  Location: [place];  Service: Ophthalmology;  Laterality: Left;  . CORONARY ARTERY BYPASS GRAFT  04/07/2002   [facility]  . Cysto Bladder Botox Injection  07/02/2014  . CYSTOSCOPY W/ RETROGRADES Right 03/16/2017   Procedure: CYSTOSCOPY WITH RETROGRADE PYELOGRAM;  Surgeon: [name], MD;  Location: [facility];  Service: Urology;  Laterality: Right;  . CYSTOSCOPY WITH STENT PLACEMENT Right 03/16/2017   Procedure: CYSTOSCOPY WITH  STENT PLACEMENT;  Surgeon: [name], MD;  Location: [facility];  Service: Urology;  Laterality: Right;  . EYE SURGERY Left    blood clot behind left eye  . HAND SURGERY Bilateral   . HERNIA REPAIR Right    inguinal hernia repair  . LEFT HEART CATH AND CORS/GRAFTS ANGIOGRAPHY N/A 04/25/2017   Procedure: Left Heart Cath and Cors/Grafts Angiography;  Surgeon: Isaias Cowman, MD;  Location: [place] [facility];  Service: Cardiovascular;  Laterality: N/A;  . PORTA CATH INSERTION N/A 05/30/2017   Procedure: Glori Luis Cath Insertion;  Surgeon: Katha Cabal, MD;  Location: [place] [facility];  Service: Cardiovascular;  Laterality: N/A;  . ROBOT ASSITED LAPAROSCOPIC NEPHROURETERECTOMY Right 05/02/2017   Procedure: ROBOT ASSITED LAPAROSCOPIC NEPHROURETERECTOMY ATTEMPTED;  Surgeon: [name], MD;  Location: [facility];  Service: Urology;  Laterality: Right;  . URETEROSCOPY Right 03/16/2017   Procedure: URETEROSCOPY BIOPSY RENAL MASS;  Surgeon: [name], MD;  Location: [facility];  Service: Urology;  Laterality: Right;    Social History   Social History  . Marital status: Married    Spouse name: N/A  . Number of children: N/A  . Years of education: N/A   Occupational History  . Not on file.   Social History Main Topics  . Smoking status: Former Smoker    Types: Cigarettes    Quit  date: 04/07/2002  . Smokeless tobacco: Former Systems developer    Types: Snuff, Chew  . Alcohol use No  . Drug use: No  . Sexual activity: Not on file   Other Topics Concern  . Not on file   Social History Narrative  . No narrative on file    Family History  Problem Relation Age of Onset  . Heart attack Mother   . Heart disease Father   . Prostate cancer Neg Hx   . Bladder Cancer Neg Hx   . Kidney cancer Neg Hx      Current Outpatient Prescriptions:  .  acetaminophen (TYLENOL) 500 MG tablet, Take 500 mg by mouth 2 (two) times daily as needed (for pain.). , Disp: , Rfl:  .  aspirin  EC 81 MG tablet, Take 81 mg by mouth daily after breakfast. , Disp: , Rfl:  .  buPROPion (WELLBUTRIN SR) 100 MG 12 hr tablet, TAKE ONE TABLET BY MOUTH EVERY DAY, Disp: , Rfl:  .  cyclobenzaprine (FLEXERIL) 10 MG tablet, Take 10 mg by mouth at bedtime as needed for muscle spasms., Disp: , Rfl:  .  ezetimibe-simvastatin (VYTORIN) 10-40 MG tablet, Take 1 tablet by mouth at bedtime., Disp: , Rfl:  .  HYDROcodone-acetaminophen (NORCO) 7.5-325 MG tablet, Take 1 tablet by mouth every 4 (four) hours as needed for moderate pain., Disp: 180 tablet, Rfl: 0 .  hyoscyamine (ANASPAZ) 0.125 MG TBDP disintergrating tablet, Place 0.125 mg under the tongue every 6 (six) hours as needed (for abdominal cramping due to IBS)., Disp: , Rfl:  .  ketoconazole (NIZORAL) 2 % shampoo, APPLY SHAMPOO TO HAIR AS NEEDED FOR FLAKY/ITCHY SCALP (TYPICALLY EVERY 2-3 DAYS), Disp: , Rfl:  .  levofloxacin (LEVAQUIN) 500 MG tablet, Take 1 tablet (500 mg total) by mouth daily., Disp: 10 tablet, Rfl: 0 .  levothyroxine (SYNTHROID, LEVOTHROID) 100 MCG tablet, Take 100 mcg by mouth daily before breakfast., Disp: , Rfl:  .  lidocaine (LIDODERM) 5 %, Place 1 patch onto the skin every 12 (twelve) hours. Remove & Discard patch within 12 hours or as directed by MD, Disp: 10 patch, Rfl: 0 .  lidocaine-prilocaine (EMLA) cream, Apply to affected area once, Disp: 30 g, Rfl: 3 .  LORazepam (ATIVAN) 0.5 MG tablet, Take 1 tablet (0.5 mg total) by mouth every 8 (eight) hours as needed for anxiety (nausea)., Disp: 30 tablet, Rfl: 0 .  Methylcellulose, Laxative, (CITRUCEL PO), Take 1 tablet by mouth daily after breakfast., Disp: , Rfl:  .  morphine (MS CONTIN) 30 MG 12 hr tablet, Take 30 mg by mouth every 12 (twelve) hours., Disp: , Rfl:  .  Multiple Vitamin (MULTIVITAMIN WITH MINERALS) TABS tablet, Take 1 tablet by mouth daily after breakfast. CENTRUM SILVER, Disp: , Rfl:  .  ondansetron (ZOFRAN) 8 MG tablet, Take 1 tablet (8 mg total) by mouth 2 (two) times  daily as needed for refractory nausea / vomiting. Start on day 3 after carboplatin chemo., Disp: 30 tablet, Rfl: 1 .  PEPPERMINT OIL PO, Take 1 capsule by mouth 3 (three) times daily as needed (for IBS). IBgard (active ingredient: 90 mg ultrapurified peppermint oil), Disp: , Rfl:  .  polyethylene glycol powder (GLYCOLAX/MIRALAX) powder, Take 17 g by mouth daily after breakfast. Mix with glass of water, Disp: 255 g, Rfl: 1 .  predniSONE (DELTASONE) 10 MG tablet, Take 1 tablet (10 mg total) by mouth daily with breakfast., Disp: 30 tablet, Rfl: 0 .  Probiotic Product (PROBIOTIC PO), Take 1  capsule by mouth daily as needed (for digestive health (see instructions)). AFTER BREAKFAST EITHER TAKE A PROBIOTIC OR EAT YOGURT, Disp: , Rfl:  .  prochlorperazine (COMPAZINE) 10 MG tablet, Take 1 tablet (10 mg total) by mouth every 6 (six) hours as needed (Nausea or vomiting)., Disp: 30 tablet, Rfl: 1 .  RABEprazole (ACIPHEX) 20 MG tablet, Take 20 mg by mouth daily before breakfast. , Disp: , Rfl:  .  ramipril (ALTACE) 10 MG capsule, Take 10 mg by mouth daily after breakfast., Disp: , Rfl:  .  senna (SENOKOT) 8.6 MG TABS tablet, Take 2 tablets (17.2 mg total) by mouth daily., Disp: 120 each, Rfl: 0 .  sertraline (ZOLOFT) 100 MG tablet, Take 100 mg by mouth daily after breakfast., Disp: , Rfl:  .  tamsulosin (FLOMAX) 0.4 MG CAPS capsule, Take 0.8 mg by mouth daily after supper. , Disp: , Rfl:  .  Oxycodone HCl 20 MG TABS, Take 2 tablets by mouth every 12 (twelve) hours., Disp: , Rfl:  .  promethazine (PHENERGAN) 25 MG tablet, Take 1 tablet (25 mg total) by mouth every 6 (six) hours as needed for nausea or vomiting., Disp: 30 tablet, Rfl: 0 No current facility-administered medications for this visit.   Facility-Administered Medications Ordered in Other Visits:  .  0.9 %  sodium chloride infusion, , Intravenous, Continuous, Pancho Rushing, Wandra Feinstein, NP, Stopped at 06/14/17 1310  Physical exam:  Vitals:   06/14/17 1125    BP: 135/67  Pulse: 76  Resp: 18  Temp: (!) 97 F (36.1 C)  TempSrc: Tympanic   Physical Exam  Constitutional: He is oriented to person, place, and time. He appears not dehydrated. He has a sickly appearance.  HENT:  Head: Normocephalic and atraumatic.  Eyes: Pupils are equal, round, and reactive to light.  Neck: Normal range of motion. Neck supple.  Cardiovascular: Normal rate and regular rhythm.   Pulmonary/Chest: Effort normal and breath sounds normal.  Abdominal: Soft. Normal appearance and bowel sounds are normal.  Musculoskeletal: He exhibits tenderness.       Lumbar back: He exhibits pain.  Neurological: He is alert and oriented to person, place, and time.  Skin: Skin is warm, dry and intact.  Psychiatric: Mood, memory, affect and judgment normal.     CMP Latest Ref Rng & Units 06/14/2017  Glucose 65 - 99 mg/dL 118(H)  BUN 6 - 20 mg/dL 21(H)  Creatinine 0.61 - 1.24 mg/dL 1.04  Sodium 135 - 145 mmol/L 134(L)  Potassium 3.5 - 5.1 mmol/L 4.0  Chloride 101 - 111 mmol/L 95(L)  CO2 22 - 32 mmol/L 28  Calcium 8.9 - 10.3 mg/dL 9.4  Total Protein 6.5 - 8.1 g/dL 7.1  Total Bilirubin 0.3 - 1.2 mg/dL 0.8  Alkaline Phos 38 - 126 U/L 133(H)  AST 15 - 41 U/L 50(H)  ALT 17 - 63 U/L 70(H)   CBC Latest Ref Rng & Units 06/14/2017  WBC 3.8 - 10.6 K/uL 3.6(L)  Hemoglobin 13.0 - 18.0 g/dL 11.5(L)  Hematocrit 40.0 - 52.0 % 33.5(L)  Platelets 150 - 440 K/uL 81(L)    No images are attached to the encounter.  No results found.   Assessment and plan- Patient is a 76 y.o. male he presents with nausea/vomiting and weakness. Labs were drawn. Patient is mildly hyponatremic (134). His kidney functions mildly elevated (21).  His liver function is elevated (50, 70). He is mildly anemic (11.5) and thrombocytopenic (81). Vital signs are stable. He is afebrile. Lungs are clear.  His pain is 8 out of 10 bilateral lower back. He is nauseated and has vomited twice.  1. Nausea/Vomiting/weakness: 1  liter Normal Saline and IV 10 mg Decadron and IV 8 mg Zofran. Continue antiemetics as prescribed.  2. RTC as scheduled on 06/18/17 to see Dr. Tasia Catchings with labs and chemo.     Visit Diagnosis 1. Intractable vomiting with nausea, unspecified vomiting type     Patient expressed understanding and was in agreement with this plan. He also understands that He can call clinic at any time with any questions, concerns, or complaints.    Marisue Humble [facility] Pager- [phone] 06/14/2017 3:37 PM

## 2017-06-15 ENCOUNTER — Ambulatory Visit: Payer: Medicare Other

## 2017-06-16 ENCOUNTER — Ambulatory Visit
Admission: RE | Admit: 2017-06-16 | Discharge: 2017-06-16 | Disposition: A | Discharge status: Home / Self Care | Payer: Medicare Other | Source: Ambulatory Visit | Attending: Oncology | Admitting: Oncology

## 2017-06-16 ENCOUNTER — Ambulatory Visit: Payer: Medicare Other

## 2017-06-16 DIAGNOSIS — G319 Degenerative disease of nervous system, unspecified: Secondary | ICD-10-CM | POA: Insufficient documentation

## 2017-06-16 DIAGNOSIS — R112 Nausea with vomiting, unspecified: Secondary | ICD-10-CM | POA: Diagnosis not present

## 2017-06-16 MED ORDER — GADOBENATE DIMEGLUMINE 529 MG/ML IV SOLN
14.0000 mL | Freq: Once | INTRAVENOUS | Status: AC | PRN
  Administered 2017-06-16: 14 mL via INTRAVENOUS

## 2017-06-18 ENCOUNTER — Inpatient Hospital Stay: Payer: Medicare Other | Admitting: Oncology

## 2017-06-18 ENCOUNTER — Ambulatory Visit: Payer: Medicare Other

## 2017-06-18 ENCOUNTER — Inpatient Hospital Stay: Payer: Medicare Other

## 2017-06-19 ENCOUNTER — Ambulatory Visit: Payer: Medicare Other

## 2017-06-20 ENCOUNTER — Ambulatory Visit: Payer: Medicare Other

## 2017-06-21 ENCOUNTER — Ambulatory Visit: Payer: Medicare Other

## 2017-06-22 ENCOUNTER — Ambulatory Visit: Payer: Medicare Other

## 2017-06-25 ENCOUNTER — Inpatient Hospital Stay: Payer: Medicare Other | Attending: Oncology | Admitting: Oncology

## 2017-06-25 ENCOUNTER — Encounter: Payer: Self-pay | Admitting: Oncology

## 2017-06-25 ENCOUNTER — Inpatient Hospital Stay: Payer: Medicare Other

## 2017-06-25 ENCOUNTER — Ambulatory Visit
Admission: RE | Admit: 2017-06-25 | Discharge: 2017-06-25 | Disposition: A | Discharge status: Home / Self Care | Payer: Medicare Other | Source: Ambulatory Visit | Attending: Radiation Oncology | Admitting: Radiation Oncology

## 2017-06-25 ENCOUNTER — Ambulatory Visit: Payer: Medicare Other

## 2017-06-25 VITALS — BP 137/79 | HR 77 | Temp 96.3°F | Wt 142.0 lb

## 2017-06-25 DIAGNOSIS — G893 Neoplasm related pain (acute) (chronic): Secondary | ICD-10-CM | POA: Insufficient documentation

## 2017-06-25 DIAGNOSIS — C679 Malignant neoplasm of bladder, unspecified: Secondary | ICD-10-CM | POA: Insufficient documentation

## 2017-06-25 DIAGNOSIS — Z5111 Encounter for antineoplastic chemotherapy: Secondary | ICD-10-CM

## 2017-06-25 DIAGNOSIS — I2510 Atherosclerotic heart disease of native coronary artery without angina pectoris: Secondary | ICD-10-CM | POA: Insufficient documentation

## 2017-06-25 DIAGNOSIS — R42 Dizziness and giddiness: Secondary | ICD-10-CM | POA: Insufficient documentation

## 2017-06-25 DIAGNOSIS — M549 Dorsalgia, unspecified: Secondary | ICD-10-CM | POA: Diagnosis not present

## 2017-06-25 DIAGNOSIS — Z951 Presence of aortocoronary bypass graft: Secondary | ICD-10-CM | POA: Insufficient documentation

## 2017-06-25 DIAGNOSIS — E871 Hypo-osmolality and hyponatremia: Secondary | ICD-10-CM | POA: Diagnosis not present

## 2017-06-25 DIAGNOSIS — Z905 Acquired absence of kidney: Secondary | ICD-10-CM | POA: Diagnosis not present

## 2017-06-25 DIAGNOSIS — R530 Neoplastic (malignant) related fatigue: Secondary | ICD-10-CM | POA: Diagnosis not present

## 2017-06-25 DIAGNOSIS — C641 Malignant neoplasm of right kidney, except renal pelvis: Secondary | ICD-10-CM

## 2017-06-25 DIAGNOSIS — Z7982 Long term (current) use of aspirin: Secondary | ICD-10-CM | POA: Insufficient documentation

## 2017-06-25 DIAGNOSIS — C7951 Secondary malignant neoplasm of bone: Secondary | ICD-10-CM | POA: Diagnosis not present

## 2017-06-25 DIAGNOSIS — R630 Anorexia: Secondary | ICD-10-CM | POA: Diagnosis not present

## 2017-06-25 DIAGNOSIS — K5903 Drug induced constipation: Secondary | ICD-10-CM

## 2017-06-25 DIAGNOSIS — Z906 Acquired absence of other parts of urinary tract: Secondary | ICD-10-CM | POA: Insufficient documentation

## 2017-06-25 DIAGNOSIS — T451X5A Adverse effect of antineoplastic and immunosuppressive drugs, initial encounter: Secondary | ICD-10-CM

## 2017-06-25 DIAGNOSIS — J449 Chronic obstructive pulmonary disease, unspecified: Secondary | ICD-10-CM | POA: Insufficient documentation

## 2017-06-25 DIAGNOSIS — T451X5S Adverse effect of antineoplastic and immunosuppressive drugs, sequela: Secondary | ICD-10-CM | POA: Diagnosis not present

## 2017-06-25 DIAGNOSIS — R112 Nausea with vomiting, unspecified: Secondary | ICD-10-CM | POA: Insufficient documentation

## 2017-06-25 DIAGNOSIS — N319 Neuromuscular dysfunction of bladder, unspecified: Secondary | ICD-10-CM

## 2017-06-25 DIAGNOSIS — R140 Abdominal distension (gaseous): Secondary | ICD-10-CM | POA: Diagnosis not present

## 2017-06-25 DIAGNOSIS — R001 Bradycardia, unspecified: Secondary | ICD-10-CM | POA: Insufficient documentation

## 2017-06-25 DIAGNOSIS — N400 Enlarged prostate without lower urinary tract symptoms: Secondary | ICD-10-CM | POA: Insufficient documentation

## 2017-06-25 DIAGNOSIS — R531 Weakness: Secondary | ICD-10-CM | POA: Insufficient documentation

## 2017-06-25 DIAGNOSIS — D6959 Other secondary thrombocytopenia: Secondary | ICD-10-CM | POA: Insufficient documentation

## 2017-06-25 DIAGNOSIS — Z79899 Other long term (current) drug therapy: Secondary | ICD-10-CM | POA: Insufficient documentation

## 2017-06-25 DIAGNOSIS — Z87891 Personal history of nicotine dependence: Secondary | ICD-10-CM | POA: Insufficient documentation

## 2017-06-25 DIAGNOSIS — C651 Malignant neoplasm of right renal pelvis: Secondary | ICD-10-CM | POA: Diagnosis not present

## 2017-06-25 DIAGNOSIS — T402X5A Adverse effect of other opioids, initial encounter: Secondary | ICD-10-CM

## 2017-06-25 DIAGNOSIS — N183 Chronic kidney disease, stage 3 (moderate): Secondary | ICD-10-CM | POA: Insufficient documentation

## 2017-06-25 DIAGNOSIS — E039 Hypothyroidism, unspecified: Secondary | ICD-10-CM | POA: Insufficient documentation

## 2017-06-25 DIAGNOSIS — K5900 Constipation, unspecified: Secondary | ICD-10-CM | POA: Diagnosis not present

## 2017-06-25 DIAGNOSIS — M5136 Other intervertebral disc degeneration, lumbar region: Secondary | ICD-10-CM | POA: Insufficient documentation

## 2017-06-25 DIAGNOSIS — E785 Hyperlipidemia, unspecified: Secondary | ICD-10-CM | POA: Insufficient documentation

## 2017-06-25 DIAGNOSIS — R634 Abnormal weight loss: Secondary | ICD-10-CM

## 2017-06-25 LAB — CBC WITH DIFFERENTIAL/PLATELET
BASOS ABS: 0 10*3/uL (ref 0–0.1)
BASOS PCT: 0 %
EOS ABS: 0 10*3/uL (ref 0–0.7)
EOS PCT: 0 %
HEMATOCRIT: 36 % — AB (ref 40.0–52.0)
Hemoglobin: 12.3 g/dL — ABNORMAL LOW (ref 13.0–18.0)
Lymphocytes Relative: 11 %
Lymphs Abs: 0.8 10*3/uL — ABNORMAL LOW (ref 1.0–3.6)
MCH: 28.6 pg (ref 26.0–34.0)
MCHC: 34.1 g/dL (ref 32.0–36.0)
MCV: 83.7 fL (ref 80.0–100.0)
MONO ABS: 0.4 10*3/uL (ref 0.2–1.0)
Monocytes Relative: 7 %
NEUTROS ABS: 5.5 10*3/uL (ref 1.4–6.5)
Neutrophils Relative %: 82 %
PLATELETS: 246 10*3/uL (ref 150–440)
RBC: 4.29 MIL/uL — ABNORMAL LOW (ref 4.40–5.90)
RDW: 15.5 % — ABNORMAL HIGH (ref 11.5–14.5)
WBC: 6.8 10*3/uL (ref 3.8–10.6)

## 2017-06-25 LAB — COMPREHENSIVE METABOLIC PANEL
ALBUMIN: 3.5 g/dL (ref 3.5–5.0)
ALK PHOS: 169 U/L — AB (ref 38–126)
ALT: 79 U/L — AB (ref 17–63)
AST: 35 U/L (ref 15–41)
Anion gap: 10 (ref 5–15)
BUN: 27 mg/dL — ABNORMAL HIGH (ref 6–20)
CALCIUM: 9 mg/dL (ref 8.9–10.3)
CO2: 25 mmol/L (ref 22–32)
CREATININE: 0.91 mg/dL (ref 0.61–1.24)
Chloride: 96 mmol/L — ABNORMAL LOW (ref 101–111)
GFR calc non Af Amer: >60 mL/min (ref >60)
GLUCOSE: 127 mg/dL — AB (ref 65–99)
Potassium: 4.6 mmol/L (ref 3.5–5.1)
Sodium: 131 mmol/L — ABNORMAL LOW (ref 135–145)
Total Bilirubin: 0.4 mg/dL (ref 0.3–1.2)
Total Protein: 7 g/dL (ref 6.5–8.1)

## 2017-06-25 MED ORDER — SODIUM CHLORIDE 0.9 % IV SOLN
INTRAVENOUS | Status: DC | PRN
  Administered 2017-06-25: 12:00:00 via INTRAVENOUS
  Filled 2017-06-25: qty 1000

## 2017-06-25 MED ORDER — SODIUM CHLORIDE 0.9 % IV SOLN
1800.0000 mg | Freq: Once | INTRAVENOUS | Status: AC
  Administered 2017-06-25: 1800 mg via INTRAVENOUS
  Filled 2017-06-25: qty 26.3

## 2017-06-25 MED ORDER — SODIUM CHLORIDE 0.9 % IV SOLN: 10.0000 mg | Freq: Once | INTRAVENOUS | Status: DC

## 2017-06-25 MED ORDER — PALONOSETRON HCL INJECTION 0.25 MG/5ML
0.2500 mg | Freq: Once | INTRAVENOUS | Status: AC
  Administered 2017-06-25: 0.25 mg via INTRAVENOUS
  Filled 2017-06-25: qty 5

## 2017-06-25 MED ORDER — DEXAMETHASONE SODIUM PHOSPHATE 10 MG/ML IJ SOLN
10.0000 mg | Freq: Once | INTRAMUSCULAR | Status: AC
  Administered 2017-06-25: 10 mg via INTRAVENOUS
  Filled 2017-06-25: qty 1

## 2017-06-25 MED ORDER — SODIUM CHLORIDE 0.9 % IV SOLN
Freq: Once | INTRAVENOUS | Status: AC
  Administered 2017-06-25: 12:00:00 via INTRAVENOUS
  Filled 2017-06-25: qty 1000

## 2017-06-25 MED ORDER — HEPARIN SOD (PORK) LOCK FLUSH 100 UNIT/ML IV SOLN
500.0000 [IU] | Freq: Once | INTRAVENOUS | Status: AC | PRN
  Administered 2017-06-25: 500 [IU]
  Filled 2017-06-25: qty 5

## 2017-06-25 MED ORDER — SODIUM CHLORIDE 0.9 % IV SOLN
150.0000 mg | Freq: Once | INTRAVENOUS | Status: AC
  Administered 2017-06-25: 150 mg via INTRAVENOUS
  Filled 2017-06-25: qty 5

## 2017-06-25 MED ORDER — SODIUM CHLORIDE 0.9 % IV SOLN
420.0000 mg | Freq: Once | INTRAVENOUS | Status: AC
  Administered 2017-06-25: 420 mg via INTRAVENOUS
  Filled 2017-06-25: qty 42

## 2017-06-25 MED ORDER — LIDOCAINE 5 % EX PTCH: 1.0000 | MEDICATED_PATCH | Freq: Two times a day (BID) | CUTANEOUS | 11 refills | Status: AC

## 2017-06-25 NOTE — Progress Notes (Signed)
Gemzar dose at 1000mg /m2 verified with MD

## 2017-06-25 NOTE — Progress Notes (Signed)
[facility] Follow Up visit  Patient Care Team: [name], MD as PCP - General (Physician Assistant)  REASON FOR VISIT Treatment of Locally advanced kidney urothelial cancer  ONCOLOGY HISTORY Joseph Ritter 76 y.o. male with PMH listed as below  is referred to [place] for evaluation of locally advanced kidney urothelia cancer. Patient developed gross hematuria at the end of 2017. CT 08/2016 showed endophytic hypodense mass. Repeat image 01/2017 showed enlarged  endophytic hypodense mass right kidney. 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 Right robotic nephroureterectomy was attempted but aborted due to unresectable newly metastatic disease diagnosed intraoperatively. Postop he underwent a CT scan of the abdomen and pelvis which showed extension of an infiltrative mass from the right kidney along the right renal vein toward the IVC with encasement of the IVC.  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 He self catheterize a few time a day.  He reports having back pain which limits her ability to walk. Also constipated and abdominal bloating. His appetite is fair. He takes Norco for abdominal and back pain.  He can walk with walker for very short distance, mostly limited by back pain and fatigue./weakness.  # Images: CT chest and abdomen 05/03/2017 1. Pneumoperitoneum with a small amount of ascites, gas tracking along the thoracoabdominal wall into the scrotum, and a small amount of gas in the right perirenal space-these findings are likely related to the patient's attempted nephroureterectomy yesterday. Surveillance to ensure expected resolution of the pneumoperitoneum might be appropriate. 2. The right kidney lower pole infarct with lack of patency of the more inferior of the 3 right renal arteries. There is delayed nephrogram on the right with increase an infiltrative process in theright  kidney and extending into the right renal hilar vascular structures, including the right renal vein and the adjacent IVC, compatible with tumor thrombus. There is also soft tissue density surrounding the renal hilar vascular structures and extending into the retroperitoneum and periaortic region, some of which may be tumor and some of which may be hematoma.3. Demineralization of the anterior inferior L2 vertebral body,concerning for tumor invasion. This is near the level of the retroaortic left renal vein. 4. No findings of metastatic disease to the chest. There are new trace bilateral pleural effusions with mild passive atelectasis.  #Pathology: 05/02/2017 SPECIMEN SUBMITTED: A. Hilar tissue, right kidney B. Hilar tissue, right kidney DIAGNOSIS:  A and B. SOFT TISSUE, RIGHT RENAL HILUM  PD-L1 - Urothelial (TECENTRIQ) Immunohistochemistry Analysis  Result: 0% Interpretation: No expression  # Current Treatment: 1 Palliative RT to spine 2 Chemotherapy:     06/01/2017 Cycle 1 Gemcitabine and Carboplain.  Day 8 Gemcitabine was delayed due to intractable nausea and vomiting.     Chemotherapy related thrombocytopenia, no active bleeding. 50% dose of Gemcitabine was given on day 11.     06/25/2017 Cycle 2 Gemcitabine and Carboplain.   INTERVAL HISTORY 76 yo male who has above oncology history reviewed by me today presents for evaluation prior to cycle 2 Carboplatin and Gemcitabine.Today is Day 1. His complaints/symtoms listed as below: 1 Chemotherapy induced nausea: he takes Zofran, compazine, phenergan PRN. Also takes Ativan PRN. He still have intermittent nausea and vomit episodes which are improved with antiemetics.  2 Neoplasm related fatigue: still feel very tried, lack of energy   3 Neoplasm related pain, mostly on his back, persistent, sharp in nature, he rate his pain right now is  8 out of 10, he took 2 tablets of Norco this morning 8:30AM. He is due MS contin. Last night before go to bed, he  reports his pain was about 5/10. Takes MS contin 30m BID, prednisone 118mdaily, 2 tabs of Norco 7.5/325 mg Q6 hours PRN.  4 Opioid associated constipation: takes senokot 8.1m12m tabs daily and stable.  5 chemotherapy induced poor appetite: has not improved. Does not eat well.   Review of Systems  Constitutional: Positive for appetite change and fatigue.  HENT:  Negative.   Eyes: Negative.   Respiratory: Negative.   Cardiovascular: Negative.   Gastrointestinal: Positive for abdominal distention, constipation and diarrhea.       Nausea vomiting is better  Endocrine: Negative.   Genitourinary: Positive for difficulty urinating.   Musculoskeletal: Positive for back pain.  Skin: Negative.     MEDICAL HISTORY: Past Medical History:  Diagnosis Date  . Acid indigestion 03/09/2014  . Adult hypothyroidism 11/18/2012  . Anxiety   . Arteriosclerosis of coronary artery 11/18/2012   Overview:  PATENT LIMA TO LAD, PATENT SVG TO PDA AND OCCULUDED SVG TO OM2 BY CATHERIZATION 02/09/2009   . 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 Borderline diabetes 05/25/2014  . BP (high blood pressure) 11/18/2012  . BPH (benign prostatic hypertrophy)   . CAFL (chronic airflow limitation) ([facility]/04/2015  . CCF (congestive cardiac failure) ([facility]07/2014   Overview:  HX OF   . Clinical depression 05/03/2015  . COPD (chronic obstructive pulmonary disease) ([facility] DDD (degenerative disc disease), lumbar 06/29/2014  . 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 Gastric ulcer 03/09/2014  . 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 History of urinary self-catheterization 2017  . HLD (hyperlipidemia) 01/03/2014  . Incomplete bladder emptying 11/18/2012  . Leg weakness 11/18/2012  . Lumbar radiculitis   . Neuritis or radiculitis due to rupture of lumbar intervertebral disc 06/29/2014  .  Overactive bladder   . PONV (postoperative nausea and vomiting)    years ago with Ether, no problem with Nausea or vomiting with the last few surgerys  . Pre-diabetes   . Subacute transverse myelitis ([facility]/27/2014  . Teeth problem    pt reports "bad teeth", "need to be pulled"    SURGICAL HISTORY: Past Surgical History:  Procedure Laterality Date  . ARTERY BIOPSY Right 12/16/2015   Procedure: BIOPSY TEMPORAL ARTERY;  Surgeon: JasAlgernon HuxleyD;  Location: [facility];  Service: Vascular;  Laterality: Right;  . CARDIAC CATHETERIZATION    . CATARACT EXTRACTION W/PHACO Left 01/26/2016   Procedure: CATARACT EXTRACTION PHACO AND INTRAOCULAR LENS PLACEMENT (IOC) LEFT EYE;  Surgeon: ChaLeandrew KoyanagiD;  [place] [facility];  Laterality: Left;  . CORONARY ARTERY BYPASS GRAFT  04/07/2002   [facility]  . Cysto Bladder Botox Injection  07/02/2014  . CYSTOSCOPY W/ RETROGRADES Right 03/16/2017   Procedure: CYSTOSCOPY WITH RETROGRADE PYELOGRAM;  Surgeon: BudNickie RetortD;  Location: [facility];  Service: Urology;  Laterality: Right;  . CYSTOSCOPY WITH STENT PLACEMENT Right 03/16/2017   Procedure: CYSTOSCOPY WITH STENT PLACEMENT;  Surgeon: BudNickie RetortD;  Location: [facility];  Service: Urology;  Laterality: Right;  . EYE SURGERY Left    blood clot behind left eye  . HAND SURGERY Bilateral   . HERNIA REPAIR Right  inguinal hernia repair  . LEFT HEART CATH AND CORS/GRAFTS ANGIOGRAPHY N/A 04/25/2017   Procedure: Left Heart Cath and Cors/Grafts Angiography;  Surgeon: Isaias Cowman, MD;  Location: [facility];  Service: Cardiovascular;  Laterality: N/A;  . PORTA CATH INSERTION N/A 05/30/2017   Procedure: Glori Luis Cath Insertion;  Surgeon: Katha Cabal, MD;  Location: [facility];  Service: Cardiovascular;  Laterality: N/A;  . ROBOT ASSITED LAPAROSCOPIC NEPHROURETERECTOMY Right 05/02/2017   Procedure: ROBOT ASSITED LAPAROSCOPIC  NEPHROURETERECTOMY ATTEMPTED;  Surgeon: [name], MD;  Location: [facility];  Service: Urology;  Laterality: Right;  . URETEROSCOPY Right 03/16/2017   Procedure: URETEROSCOPY BIOPSY RENAL MASS;  Surgeon: [name], MD;  Location: [facility];  Service: Urology;  Laterality: Right;    SOCIAL HISTORY: Social History   Social History  . Marital status: Married    Spouse name: N/A  . Number of children: N/A  . Years of education: N/A   Occupational History  . Not on file.   Social History Main Topics  . Smoking status: Former Smoker    Types: Cigarettes    Quit date: 04/07/2002  . Smokeless tobacco: Former Systems developer    Types: Snuff, Chew  . Alcohol use No  . Drug use: No  . Sexual activity: Not on file   Other Topics Concern  . Not on file   Social History Narrative  . No narrative on file    FAMILY HISTORY Family History  Problem Relation Age of Onset  . Heart attack Mother   . Heart disease Father   . Prostate cancer Neg Hx   . Bladder Cancer Neg Hx   . Kidney cancer Neg Hx     ALLERGIES:  has No Known Allergies.  MEDICATIONS:  Current Outpatient Prescriptions  Medication Sig Dispense Refill  . acetaminophen (TYLENOL) 500 MG tablet Take 500 mg by mouth 2 (two) times daily as needed (for pain.).     Marland Kitchen aspirin EC 81 MG tablet Take 81 mg by mouth daily after breakfast.     . buPROPion (WELLBUTRIN SR) 100 MG 12 hr tablet TAKE ONE TABLET BY MOUTH EVERY DAY    . cyclobenzaprine (FLEXERIL) 10 MG tablet Take 10 mg by mouth at bedtime as needed for muscle spasms.    Marland Kitchen ezetimibe-simvastatin (VYTORIN) 10-40 MG tablet Take 1 tablet by mouth at bedtime.    Marland Kitchen HYDROcodone-acetaminophen (NORCO) 7.5-325 MG tablet Take 1 tablet by mouth every 4 (four) hours as needed for moderate pain. 180 tablet 0  . 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    Marland Kitchen  ketoconazole (NIZORAL) 2 % shampoo APPLY SHAMPOO TO HAIR AS NEEDED FOR FLAKY/ITCHY SCALP (TYPICALLY EVERY 2-3 DAYS)    . levofloxacin (LEVAQUIN) 500 MG tablet Take 1 tablet (500 mg total) by mouth daily. 10 tablet 0  . levothyroxine (SYNTHROID, LEVOTHROID) 100 MCG tablet Take 100 mcg by mouth daily before breakfast.    . lidocaine (LIDODERM) 5 % Place 1 patch onto the skin every 12 (twelve) hours. Remove & Discard patch within 12 hours or as directed by MD 10 patch 0  . lidocaine-prilocaine (EMLA) cream Apply to affected area once 30 g 3  . LORazepam (ATIVAN) 0.5 MG tablet Take 1 tablet (0.5 mg total) by mouth every 8 (eight) hours as needed for anxiety (nausea). 30 tablet 0  . Methylcellulose, Laxative, (CITRUCEL PO)  Take 1 tablet by mouth daily after breakfast.    . morphine (MS CONTIN) 30 MG 12 hr tablet Take 30 mg by mouth every 12 (twelve) hours.    . Multiple Vitamin (MULTIVITAMIN WITH MINERALS) TABS tablet Take 1 tablet by mouth daily after breakfast. CENTRUM SILVER    . ondansetron (ZOFRAN) 8 MG tablet Take 1 tablet (8 mg total) by mouth 2 (two) times daily as needed for refractory nausea / vomiting. Start on day 3 after carboplatin chemo. 30 tablet 1  . Oxycodone HCl 20 MG TABS Take 2 tablets by mouth every 12 (twelve) hours.    Marland Kitchen PEPPERMINT OIL PO Take 1 capsule by mouth 3 (three) times daily as needed (for IBS). IBgard (active ingredient: 90 mg ultrapurified peppermint oil)    . polyethylene glycol powder (GLYCOLAX/MIRALAX) powder Take 17 g by mouth daily after breakfast. Mix with glass of water 255 g 1  . predniSONE (DELTASONE) 10 MG tablet Take 1 tablet (10 mg total) by mouth daily with breakfast. 30 tablet 0  . Probiotic Product (PROBIOTIC PO) Take 1 capsule by mouth daily as needed (for digestive health (see instructions)). AFTER BREAKFAST EITHER TAKE A PROBIOTIC OR EAT YOGURT    . prochlorperazine (COMPAZINE) 10 MG tablet Take 1 tablet (10 mg total) by mouth every 6 (six) hours as  needed (Nausea or vomiting). 30 tablet 1  . promethazine (PHENERGAN) 25 MG tablet Take 1 tablet (25 mg total) by mouth every 6 (six) hours as needed for nausea or vomiting. 30 tablet 0  . RABEprazole (ACIPHEX) 20 MG tablet Take 20 mg by mouth daily before breakfast.     . ramipril (ALTACE) 10 MG capsule Take 10 mg by mouth daily after breakfast.    . senna (SENOKOT) 8.6 MG TABS tablet Take 2 tablets (17.2 mg total) by mouth daily. 120 each 0  . sertraline (ZOLOFT) 100 MG tablet Take 100 mg by mouth daily after breakfast.    . tamsulosin (FLOMAX) 0.4 MG CAPS capsule Take 0.8 mg by mouth daily after supper.      No current facility-administered medications for this visit.     PHYSICAL EXAMINATION:  ECOG PERFORMANCE STATUS: 2 - Symptomatic, <50% confined to bed  Vitals:   06/25/17 1055  BP: 137/79  Pulse: 77  Temp: (!) 96.3 F (35.7 C)    Filed Weights   06/25/17 1055  Weight: 142 lb (64.4 kg)     Physical Exam GENERAL:not in distress,  Sitting in wheelchair. SKIN:  No rashes or significant lesions  HEAD: Normocephalic, No masses, lesions, tenderness or abnormalities  EYES: Conjunctiva are pale,  non icteric ENT: External ears normal ,lips , buccal mucosa, and tongue normal and mucous membranes are moist  LYMPH: No palpable cervical and axillary lymphadenopathy  LUNGS: Clear to auscultation, no crackles or wheezes HEART: Regular rate & rhythm, no murmurs, no gallops, S1 normal and S2 normal  ABDOMEN: Abdomen is distended, with normal bowel sounds MUSCULOSKELETAL: lumbar spine tenderness.  EXTREMITIES: No edema, no skin discoloration or tenderness NEURO: Alert & oriented, lower extremity strength 3/5 bilaterllay. Normal upper extremity strength.    LABORATORY DATA: I have personally reviewed the data as listed: CBC    Component Value Date/Time   WBC 6.8 06/25/2017 1000   RBC 4.29 (L) 06/25/2017 1000   HGB 12.3 (L) 06/25/2017 1000   HGB 15.3 11/15/2012 1641   HCT 36.0  (L) 06/25/2017 1000   HCT 43.7 11/15/2012 1641   PLT 246 06/25/2017 1000  PLT 166 11/15/2012 1641   MCV 83.7 06/25/2017 1000   MCV 89 11/15/2012 1641   MCH 28.6 06/25/2017 1000   MCHC 34.1 06/25/2017 1000   RDW 15.5 (H) 06/25/2017 1000   RDW 13.3 11/15/2012 1641   LYMPHSABS 0.8 (L) 06/25/2017 1000   LYMPHSABS 1.7 11/15/2012 1641   MONOABS 0.4 06/25/2017 1000   MONOABS 0.8 11/15/2012 1641   EOSABS 0.0 06/25/2017 1000   EOSABS 0.1 11/15/2012 1641   BASOSABS 0.0 06/25/2017 1000   BASOSABS 0.0 11/15/2012 1641   CMP Latest Ref Rng & Units 06/25/2017 06/14/2017 06/11/2017  Glucose 65 - 99 mg/dL 127(H) 118(H) 87  BUN 6 - 20 mg/dL 27(H) 21(H) 14  Creatinine 0.61 - 1.24 mg/dL 0.91 1.04 1.00  Sodium 135 - 145 mmol/L 131(L) 134(L) 134(L)  Potassium 3.5 - 5.1 mmol/L 4.6 4.0 3.8  Chloride 101 - 111 mmol/L 96(L) 95(L) 98(L)  CO2 22 - 32 mmol/L 25 28 26   Calcium 8.9 - 10.3 mg/dL 9.0 9.4 9.2  Total Protein 6.5 - 8.1 g/dL 7.0 7.1 6.9  Total Bilirubin 0.3 - 1.2 mg/dL 0.4 0.8 0.7  Alkaline Phos 38 - 126 U/L 169(H) 133(H) 157(H)  AST 15 - 41 U/L 35 50(H) 27  ALT 17 - 63 U/L 79(H) 70(H) 47     RADIOGRAPHIC STUDIES: I have personally reviewed the radiological images as listed and agree with the findings in the report 5.  Aortic Atherosclerosis (ICD10-I70.0).  Coronary atherosclerosis. 6. Inflammatory stranding from the right perirenal space extends down into the pelvis and crosses the midline. There is presacral edema. 7. 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 Recommend followup by ultrasound in 3 years. This recommendation follows ACR consensus guidelines: White Paper of the ACR Incidental Findings Committee II on Vascular Findings. J Am Coll Radiol 2013; 48:016-553 8. Wall thickening along the left side of the urinary bladder may be incidental, but synchronous sessile transitional cell carcinoma is not readily excluded.  CT abdomen wo contrast 02/09/2017  Increased  size of 4.6 cm ill-defined hypoenhancing mass ininterpolar region of right kidney, with obstruction of upper pole collecting system. This does not have typical appearance for pyelonephritis or renal cell carcinoma, and raises suspicion for urothelial carcinoma. Consider ureteroscopy for further evaluation. No evidence of metastatic disease. Stable mildly enlarged prostate and mild diffuse bladder wall thickening. Stable 3.0 cm infrarenal abdominal aortic aneurysm. Recommend followup by ultrasound in 3 years. This recommendation follows ACR consensus guidelines: White Paper of the ACR Incidental Findings Committee II on Vascular Findings. [place] [phone]. Colonic diverticulosis. No radiographic evidence of diverticulitis. Stable small fat-containing left inguinal hernia. MRI thoracic and lumbar w/wo contrast.  1. Malignant invasion of the L2 vertebral body by the adjacent retroperitoneal mass. No pathologic fracture or osseous metastatic disease elsewhere in the thoracolumbar spine. 2. Areas of epidural contrast enhancement of the lumbar spine are most consistent with a dilated epidural venous plexus secondary to invasion of the inferior vena cava by the patient's urothelial tumor. 3. Moderate L4-L5 multifactorial spinal canal stenosis with severe right and mild-to-moderate left neural foraminal stenosis. 4. Moderate right and severe left multifactorial L5-S1 neural foraminal stenosis. There is also severe attenuation of the thecal sac at this level but no bony spinal canal stenosis. 5. No spinal canal or neural foraminal stenosis in the thoracic spine.   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  Cancer Staging Transitional cell carcinoma of kidney, right [facility]) Staging  form: Kidney, AJCC 8th Edition - Clinical stage from 05/08/2017: Stage IV (cT3, cNX, cM1) - Signed by Earlie Server, MD on 05/08/2017  1. Encounter for antineoplastic chemotherapy   2. 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 Transitional cell carcinoma of kidney, right ([place])   4. Chemotherapy induced nausea and vomiting   5. Neoplasm related pain   6. Constipation due to opioid therapy   7. Neoplastic malignant related fatigue   8. Abnormal weight loss   9. Hyponatremia    1, 2, 3, S/P Cycle 1 Day 1 Gemcitabine and Carboplain. OK to proceed cycle 2 day 1 treatment today.   4 Chemotherapy induced nausea and vomiting: improved. Outpatient antiemetics discussed with patient and his wife. She has phenergan PRN, zofran PRN, compazine PRN and ativan PRN.  MRI brain negative for CNS involvement. Will give Aloxi and Emend during chemo today.   5 Neoplasm related pain: Continue MS contin 19m BID and  use 2 tablets of Norco 7.5/325mg Q6h PRN and pain seems to be better controlled. Continue low dose prednisone 180mdaily to palliate the bone pain. follow up with RadOnc to finish RT to L2 lesion for symptom relief.   6  Constipation: continue current bowel regimen.  7  Fatigue is likely due to chemotherapy.  8, 9 Weight loss likely due to poor PO intake and dehydration. Will give 1 L Normal saline for hydration today.  # hold Xgeva Q28 days for metastatic bone disease. Patient has poor dentition. Ask him to obtain dental clearance.   # Discussed with patient and his wife that treatment is with palliative intent and discussed about health power of attorney/liver will. Referred to Palliative care.    No orders of the defined types were placed in this encounter.  All questions were answered. The patient knows to call the clinic with any problems, questions or concerns.   ZhEarlie ServerMD  06/25/2017 10:30 AM

## 2017-06-25 NOTE — Progress Notes (Signed)
Patient here today for follow up.  Patient c/o lower back pain

## 2017-06-26 ENCOUNTER — Ambulatory Visit: Payer: Medicare Other

## 2017-06-26 ENCOUNTER — Ambulatory Visit
Admission: RE | Admit: 2017-06-26 | Discharge: 2017-06-26 | Disposition: A | Discharge status: Home / Self Care | Payer: Medicare Other | Source: Ambulatory Visit | Attending: Radiation Oncology | Admitting: Radiation Oncology

## 2017-06-26 DIAGNOSIS — C7951 Secondary malignant neoplasm of bone: Secondary | ICD-10-CM | POA: Diagnosis not present

## 2017-06-27 ENCOUNTER — Ambulatory Visit
Admission: RE | Admit: 2017-06-27 | Discharge: 2017-06-27 | Disposition: A | Discharge status: Home / Self Care | Payer: Medicare Other | Source: Ambulatory Visit | Attending: Radiation Oncology | Admitting: Radiation Oncology

## 2017-06-27 DIAGNOSIS — C7951 Secondary malignant neoplasm of bone: Secondary | ICD-10-CM | POA: Diagnosis not present

## 2017-07-03 ENCOUNTER — Inpatient Hospital Stay: Payer: Medicare Other

## 2017-07-03 ENCOUNTER — Inpatient Hospital Stay (HOSPITAL_BASED_OUTPATIENT_CLINIC_OR_DEPARTMENT_OTHER): Payer: Medicare Other | Admitting: Oncology

## 2017-07-03 ENCOUNTER — Other Ambulatory Visit: Payer: Self-pay

## 2017-07-03 ENCOUNTER — Emergency Department
Admission: EM | Admit: 2017-07-03 | Discharge: 2017-07-03 | Disposition: A | Discharge status: Home / Self Care | Payer: Medicare Other | Attending: Student in an Organized Health Care Education/Training Program | Admitting: Student in an Organized Health Care Education/Training Program

## 2017-07-03 ENCOUNTER — Encounter: Payer: Self-pay | Admitting: Oncology

## 2017-07-03 VITALS — BP 120/56 | HR 41 | Temp 96.6°F | Wt 142.6 lb

## 2017-07-03 DIAGNOSIS — N183 Chronic kidney disease, stage 3 unspecified: Secondary | ICD-10-CM

## 2017-07-03 DIAGNOSIS — I129 Hypertensive chronic kidney disease with stage 1 through stage 4 chronic kidney disease, or unspecified chronic kidney disease: Secondary | ICD-10-CM | POA: Diagnosis not present

## 2017-07-03 DIAGNOSIS — C641 Malignant neoplasm of right kidney, except renal pelvis: Secondary | ICD-10-CM

## 2017-07-03 DIAGNOSIS — I2510 Atherosclerotic heart disease of native coronary artery without angina pectoris: Secondary | ICD-10-CM

## 2017-07-03 DIAGNOSIS — E871 Hypo-osmolality and hyponatremia: Secondary | ICD-10-CM

## 2017-07-03 DIAGNOSIS — R630 Anorexia: Secondary | ICD-10-CM

## 2017-07-03 DIAGNOSIS — E860 Dehydration: Secondary | ICD-10-CM | POA: Diagnosis not present

## 2017-07-03 DIAGNOSIS — R7303 Prediabetes: Secondary | ICD-10-CM | POA: Insufficient documentation

## 2017-07-03 DIAGNOSIS — E039 Hypothyroidism, unspecified: Secondary | ICD-10-CM

## 2017-07-03 DIAGNOSIS — Z79899 Other long term (current) drug therapy: Secondary | ICD-10-CM | POA: Insufficient documentation

## 2017-07-03 DIAGNOSIS — M5136 Other intervertebral disc degeneration, lumbar region: Secondary | ICD-10-CM

## 2017-07-03 DIAGNOSIS — Z87891 Personal history of nicotine dependence: Secondary | ICD-10-CM | POA: Diagnosis not present

## 2017-07-03 DIAGNOSIS — F419 Anxiety disorder, unspecified: Secondary | ICD-10-CM | POA: Diagnosis not present

## 2017-07-03 DIAGNOSIS — R42 Dizziness and giddiness: Secondary | ICD-10-CM | POA: Diagnosis not present

## 2017-07-03 DIAGNOSIS — T451X5S Adverse effect of antineoplastic and immunosuppressive drugs, sequela: Secondary | ICD-10-CM | POA: Diagnosis not present

## 2017-07-03 DIAGNOSIS — C7951 Secondary malignant neoplasm of bone: Secondary | ICD-10-CM

## 2017-07-03 DIAGNOSIS — G893 Neoplasm related pain (acute) (chronic): Secondary | ICD-10-CM | POA: Diagnosis not present

## 2017-07-03 DIAGNOSIS — Z951 Presence of aortocoronary bypass graft: Secondary | ICD-10-CM | POA: Diagnosis not present

## 2017-07-03 DIAGNOSIS — Z905 Acquired absence of kidney: Secondary | ICD-10-CM

## 2017-07-03 DIAGNOSIS — D6959 Other secondary thrombocytopenia: Secondary | ICD-10-CM

## 2017-07-03 DIAGNOSIS — Z906 Acquired absence of other parts of urinary tract: Secondary | ICD-10-CM | POA: Diagnosis not present

## 2017-07-03 DIAGNOSIS — N400 Enlarged prostate without lower urinary tract symptoms: Secondary | ICD-10-CM

## 2017-07-03 DIAGNOSIS — T451X5A Adverse effect of antineoplastic and immunosuppressive drugs, initial encounter: Secondary | ICD-10-CM

## 2017-07-03 DIAGNOSIS — Z7189 Other specified counseling: Secondary | ICD-10-CM

## 2017-07-03 DIAGNOSIS — C679 Malignant neoplasm of bladder, unspecified: Secondary | ICD-10-CM

## 2017-07-03 DIAGNOSIS — R112 Nausea with vomiting, unspecified: Secondary | ICD-10-CM

## 2017-07-03 DIAGNOSIS — J449 Chronic obstructive pulmonary disease, unspecified: Secondary | ICD-10-CM | POA: Insufficient documentation

## 2017-07-03 DIAGNOSIS — Z5111 Encounter for antineoplastic chemotherapy: Secondary | ICD-10-CM

## 2017-07-03 DIAGNOSIS — N319 Neuromuscular dysfunction of bladder, unspecified: Secondary | ICD-10-CM | POA: Diagnosis not present

## 2017-07-03 DIAGNOSIS — C651 Malignant neoplasm of right renal pelvis: Secondary | ICD-10-CM | POA: Diagnosis not present

## 2017-07-03 DIAGNOSIS — K5900 Constipation, unspecified: Secondary | ICD-10-CM

## 2017-07-03 DIAGNOSIS — M549 Dorsalgia, unspecified: Secondary | ICD-10-CM

## 2017-07-03 DIAGNOSIS — R001 Bradycardia, unspecified: Secondary | ICD-10-CM | POA: Diagnosis present

## 2017-07-03 DIAGNOSIS — R531 Weakness: Secondary | ICD-10-CM

## 2017-07-03 DIAGNOSIS — Z7982 Long term (current) use of aspirin: Secondary | ICD-10-CM

## 2017-07-03 DIAGNOSIS — R530 Neoplastic (malignant) related fatigue: Secondary | ICD-10-CM

## 2017-07-03 DIAGNOSIS — R140 Abdominal distension (gaseous): Secondary | ICD-10-CM

## 2017-07-03 DIAGNOSIS — E785 Hyperlipidemia, unspecified: Secondary | ICD-10-CM

## 2017-07-03 LAB — CBC WITH DIFFERENTIAL/PLATELET
BASOS ABS: 0 10*3/uL (ref 0–0.1)
BASOS PCT: 0 %
Eosinophils Absolute: 0.1 10*3/uL (ref 0–0.7)
Eosinophils Relative: 3 %
HEMATOCRIT: 31.6 % — AB (ref 40.0–52.0)
Hemoglobin: 10.6 g/dL — ABNORMAL LOW (ref 13.0–18.0)
Lymphocytes Relative: 19 %
Lymphs Abs: 0.6 10*3/uL — ABNORMAL LOW (ref 1.0–3.6)
MCH: 28.4 pg (ref 26.0–34.0)
MCHC: 33.4 g/dL (ref 32.0–36.0)
MCV: 85.1 fL (ref 80.0–100.0)
MONO ABS: 0.3 10*3/uL (ref 0.2–1.0)
Monocytes Relative: 9 %
NEUTROS ABS: 2.1 10*3/uL (ref 1.4–6.5)
NEUTROS PCT: 69 %
PLATELETS: 123 10*3/uL — AB (ref 150–440)
RBC: 3.71 MIL/uL — ABNORMAL LOW (ref 4.40–5.90)
RDW: 16 % — AB (ref 11.5–14.5)
WBC: 3.1 10*3/uL — ABNORMAL LOW (ref 3.8–10.6)

## 2017-07-03 LAB — BASIC METABOLIC PANEL
ANION GAP: 13 (ref 5–15)
BUN: 20 mg/dL (ref 6–20)
CALCIUM: 9.1 mg/dL (ref 8.9–10.3)
CO2: 25 mmol/L (ref 22–32)
Chloride: 97 mmol/L — ABNORMAL LOW (ref 101–111)
Creatinine, Ser: 1.29 mg/dL — ABNORMAL HIGH (ref 0.61–1.24)
GFR, EST NON AFRICAN AMERICAN: 53 mL/min — AB (ref >60)
Glucose, Bld: 148 mg/dL — ABNORMAL HIGH (ref 65–99)
POTASSIUM: 3.7 mmol/L (ref 3.5–5.1)
SODIUM: 135 mmol/L (ref 135–145)

## 2017-07-03 LAB — CBC
HEMATOCRIT: 34 % — AB (ref 40.0–52.0)
HEMOGLOBIN: 11.5 g/dL — AB (ref 13.0–18.0)
MCH: 28.8 pg (ref 26.0–34.0)
MCHC: 33.9 g/dL (ref 32.0–36.0)
MCV: 84.9 fL (ref 80.0–100.0)
Platelets: 119 10*3/uL — ABNORMAL LOW (ref 150–440)
RBC: 4 MIL/uL — ABNORMAL LOW (ref 4.40–5.90)
RDW: 16.8 % — AB (ref 11.5–14.5)
WBC: 3.9 10*3/uL (ref 3.8–10.6)

## 2017-07-03 LAB — TROPONIN I: TROPONIN I: 0.03 ng/mL — AB (ref <0.03)

## 2017-07-03 LAB — COMPREHENSIVE METABOLIC PANEL
ALBUMIN: 3.3 g/dL — AB (ref 3.5–5.0)
ALT: 44 U/L (ref 17–63)
AST: 31 U/L (ref 15–41)
Alkaline Phosphatase: 110 U/L (ref 38–126)
Anion gap: 12 (ref 5–15)
BUN: 18 mg/dL (ref 6–20)
CALCIUM: 8.9 mg/dL (ref 8.9–10.3)
CO2: 26 mmol/L (ref 22–32)
Chloride: 96 mmol/L — ABNORMAL LOW (ref 101–111)
Creatinine, Ser: 1.14 mg/dL (ref 0.61–1.24)
GFR calc Af Amer: >60 mL/min (ref >60)
GLUCOSE: 136 mg/dL — AB (ref 65–99)
POTASSIUM: 3.7 mmol/L (ref 3.5–5.1)
SODIUM: 134 mmol/L — AB (ref 135–145)
TOTAL PROTEIN: 6.7 g/dL (ref 6.5–8.1)
Total Bilirubin: 0.4 mg/dL (ref 0.3–1.2)

## 2017-07-03 MED ORDER — SODIUM CHLORIDE 0.9% FLUSH
10.0000 mL | INTRAVENOUS | Status: AC | PRN
  Administered 2017-07-03: 10 mL via INTRAVENOUS
  Filled 2017-07-03: qty 10

## 2017-07-03 MED ORDER — PREDNISONE 10 MG PO TABS: 10.0000 mg | ORAL_TABLET | Freq: Every day | ORAL | 0 refills | Status: DC

## 2017-07-03 MED ORDER — SODIUM CHLORIDE 0.9 % IV SOLN
Freq: Once | INTRAVENOUS | Status: AC
  Administered 2017-07-03: 12:00:00 via INTRAVENOUS

## 2017-07-03 MED ORDER — HEPARIN SOD (PORK) LOCK FLUSH 10 UNIT/ML IV SOLN
INTRAVENOUS | Status: AC
  Administered 2017-07-03: 50 [IU]
  Filled 2017-07-03: qty 1

## 2017-07-03 MED ORDER — HEPARIN SOD (PORK) LOCK FLUSH 100 UNIT/ML IV SOLN: 500.0000 [IU] | Freq: Once | INTRAVENOUS | Status: AC

## 2017-07-03 MED ORDER — PROMETHAZINE HCL 25 MG PO TABS: 25.0000 mg | ORAL_TABLET | Freq: Four times a day (QID) | ORAL | 1 refills | Status: DC | PRN

## 2017-07-03 NOTE — ED Provider Notes (Signed)
[facility] Emergency Department Provider Note    First MD Initiated Contact with Patient 07/03/17 1127     (approximate)  I have reviewed the triage vital signs and the nursing notes.   HISTORY  Chief Complaint Bradycardia and Dizziness    HPI Joseph Ritter is a 76 y.o. male sent from the cancer center due to concern for low heart rate. Patient states that yesterday he had been feeling weak and intermittently dizzy. Just completed his initial treatment with radiation therapy last week and was noticed to go in for chemotherapy infusion this morning. States is otherwise been feeling well. His appetite has improved. Denies any fevers. No abdominal pain. No chest pain or shortness of breath. The patient's heart rate in clinic was reportedly in the 140s. Patient did take his blood pressure medications this morning but no other changes to any medications.   Past Medical History:  Diagnosis Date  . Acid indigestion 03/09/2014  . Adult hypothyroidism 11/18/2012  . Anxiety   . Arteriosclerosis of coronary artery 11/18/2012   Overview:  PATENT LIMA TO LAD, PATENT SVG TO PDA AND OCCULUDED SVG TO OM2 BY CATHERIZATION 02/09/2009   . 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 Borderline diabetes 05/25/2014  . BP (high blood pressure) 11/18/2012  . BPH (benign prostatic hypertrophy)   . CAFL (chronic airflow limitation) ([place]) 05/03/2015  . CCF (congestive cardiac failure) ([place]) 01/03/2014   Overview:  HX OF   . Clinical depression 05/03/2015  . COPD (chronic obstructive pulmonary disease) ([place])   . DDD (degenerative disc disease), lumbar 06/29/2014  . 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 Gastric ulcer 03/09/2014  . 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 History of urinary self-catheterization 2017  . HLD (hyperlipidemia) 01/03/2014  .  Incomplete bladder emptying 11/18/2012  . Leg weakness 11/18/2012  . Lumbar radiculitis   . Neuritis or radiculitis due to rupture of lumbar intervertebral disc 06/29/2014  . Overactive bladder   . PONV (postoperative nausea and vomiting)    years ago with Ether, no problem with Nausea or vomiting with the last few surgerys  . Pre-diabetes   . 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 Teeth problem    pt reports "bad teeth", "need to be pulled"   Family History  Problem Relation Age of Onset  . Heart attack Mother   . Heart disease Father   . Prostate cancer Neg Hx   . Bladder Cancer Neg Hx   . Kidney cancer Neg Hx    Past Surgical History:  Procedure Laterality Date  . ARTERY BIOPSY Right 12/16/2015   Procedure: BIOPSY TEMPORAL ARTERY;  Surgeon: Algernon Huxley, MD;  Location: [facility];  Service: Vascular;  Laterality: Right;  . CARDIAC CATHETERIZATION    . CATARACT EXTRACTION W/PHACO Left 01/26/2016   Procedure: CATARACT EXTRACTION PHACO AND INTRAOCULAR LENS PLACEMENT (IOC) LEFT EYE;  Surgeon: Leandrew Koyanagi, MD;  Location: [place];  Service: Ophthalmology;  Laterality: Left;  . CORONARY ARTERY BYPASS GRAFT  04/07/2002   [facility]  . Cysto Bladder Botox Injection  07/02/2014  . CYSTOSCOPY W/ RETROGRADES Right 03/16/2017   Procedure: CYSTOSCOPY WITH RETROGRADE PYELOGRAM;  Surgeon: [name], MD;  Location: [facility];  Service: Urology;  Laterality: Right;  . CYSTOSCOPY WITH STENT PLACEMENT Right 03/16/2017   Procedure: CYSTOSCOPY WITH  STENT PLACEMENT;  Surgeon: [name], MD;  Location: [facility];  Service: Urology;  Laterality: Right;  . EYE SURGERY Left    blood clot behind left eye  . HAND SURGERY Bilateral   . HERNIA REPAIR Right    inguinal hernia repair  . LEFT HEART CATH AND CORS/GRAFTS ANGIOGRAPHY N/A 04/25/2017   Procedure: Left Heart Cath and Cors/Grafts Angiography;  Surgeon: Isaias Cowman, MD;  Location: [place] [facility];  Service:  Cardiovascular;  Laterality: N/A;  . PORTA CATH INSERTION N/A 05/30/2017   Procedure: Glori Luis Cath Insertion;  Surgeon: Katha Cabal, MD;  Location: [place] [facility];  Service: Cardiovascular;  Laterality: N/A;  . ROBOT ASSITED LAPAROSCOPIC NEPHROURETERECTOMY Right 05/02/2017   Procedure: ROBOT ASSITED LAPAROSCOPIC NEPHROURETERECTOMY ATTEMPTED;  Surgeon: [name], MD;  Location: [facility];  Service: Urology;  Laterality: Right;  . URETEROSCOPY Right 03/16/2017   Procedure: URETEROSCOPY BIOPSY RENAL MASS;  Surgeon: [name], MD;  Location: [facility];  Service: Urology;  Laterality: Right;   Patient Active Problem List   Diagnosis Date Noted  . Goals of care, counseling/discussion 05/25/2017  . 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 CKD (chronic kidney disease) stage 3, GFR 30-59 ml/min (H[facility]) 05/08/2017  . 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 Right renal mass 02/27/2017  . Gross hematuria 08/15/2016  . Dysuria 08/15/2016  . Acute cystitis with hematuria 08/15/2016  . CAFL (chronic airflow limitation) ([place]) 05/03/2015  . Clinical depression 05/03/2015  . 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 DDD (degenerative disc disease), lumbar 06/29/2014  . Neuritis or radiculitis due to rupture of lumbar intervertebral disc 06/29/2014  . 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 Borderline diabetes 05/25/2014  . Blood glucose elevated 05/25/2014  . Acid indigestion 03/09/2014  . Gastric ulcer 03/09/2014  . CCF (congestive cardiac failure) ([place]) 01/03/2014  . HLD (hyperlipidemia) 01/03/2014  . 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 Arteriosclerosis of coronary artery 11/18/2012  . BP (high blood pressure) 11/18/2012  . Adult hypothyroidism 11/18/2012  . Incomplete bladder emptying 11/18/2012  . Leg weakness 11/18/2012  . H/O coronary  artery bypass surgery 04/07/2002      Prior to Admission medications   Medication Sig Start Date End Date Taking? Authorizing Provider  acetaminophen (TYLENOL) 500 MG tablet Take 500 mg by mouth 2 (two) times daily as needed (for pain.).     [provider]  buPROPion (WELLBUTRIN SR) 100 MG 12 hr tablet TAKE ONE TABLET BY MOUTH EVERY DAY 05/29/17   [provider]  cyclobenzaprine (FLEXERIL) 10 MG tablet Take 10 mg by mouth at bedtime as needed for muscle spasms.    [provider]  ezetimibe-simvastatin (VYTORIN) 10-40 MG tablet Take 1 tablet by mouth at bedtime.    [provider]  HYDROcodone-acetaminophen (NORCO) 7.5-325 MG tablet Take 1 tablet by mouth every 4 (four) hours as needed for moderate pain. Patient taking differently: Take 2 tablets by mouth every 6 (six) hours as needed for moderate pain.  06/01/17   Earlie Server, MD  hyoscyamine (ANASPAZ) 0.125 MG TBDP disintergrating tablet Place 0.125 mg under the tongue every 6 (six) hours as needed (for abdominal cramping due to IBS).    [provider]  ketoconazole (NIZORAL) 2 % shampoo APPLY SHAMPOO TO HAIR AS NEEDED FOR FLAKY/ITCHY SCALP (TYPICALLY EVERY 2-3 DAYS) 03/01/15   [provider]  levothyroxine (  SYNTHROID, LEVOTHROID) 100 MCG tablet Take 100 mcg by mouth daily before breakfast.    [provider]  lidocaine (LIDODERM) 5 % Place 1 patch onto the skin every 12 (twelve) hours. Remove & Discard patch within 12 hours or as directed by MD 06/25/17 06/25/18  Earlie Server, MD  lidocaine-prilocaine (EMLA) cream Apply to affected area once 05/22/17   Earlie Server, MD  LORazepam (ATIVAN) 0.5 MG tablet Take 1 tablet (0.5 mg total) by mouth every 8 (eight) hours as needed for anxiety (nausea). 06/01/17   Earlie Server, MD  Methylcellulose, Laxative, (CITRUCEL PO) Take 1 tablet by mouth daily after breakfast.    [provider]  morphine (MS CONTIN) 30 MG 12 hr tablet Take 60 mg by mouth every 12  (twelve) hours.     [provider]  Multiple Vitamin (MULTIVITAMIN WITH MINERALS) TABS tablet Take 1 tablet by mouth daily after breakfast. CENTRUM SILVER    [provider]  ondansetron (ZOFRAN) 8 MG tablet Take 1 tablet (8 mg total) by mouth 2 (two) times daily as needed for refractory nausea / vomiting. Start on day 3 after carboplatin chemo. 05/22/17   Earlie Server, MD  PEPPERMINT OIL PO Take 1 capsule by mouth 3 (three) times daily as needed (for IBS). IBgard (active ingredient: 90 mg ultrapurified peppermint oil)    [provider]  polyethylene glycol powder (GLYCOLAX/MIRALAX) powder Take 17 g by mouth daily after breakfast. Mix with glass of water 05/08/17   Earlie Server, MD  predniSONE (DELTASONE) 10 MG tablet Take 1 tablet (10 mg total) by mouth daily with breakfast. 06/11/17   Earlie Server, MD  Probiotic Product (PROBIOTIC PO) Take 1 capsule by mouth daily as needed (for digestive health (see instructions)). AFTER BREAKFAST EITHER TAKE A PROBIOTIC OR EAT YOGURT    [provider]  prochlorperazine (COMPAZINE) 10 MG tablet Take 1 tablet (10 mg total) by mouth every 6 (six) hours as needed (Nausea or vomiting). 05/22/17   Earlie Server, MD  promethazine (PHENERGAN) 25 MG tablet Take 1 tablet (25 mg total) by mouth every 6 (six) hours as needed for nausea or vomiting. 06/14/17   Jacquelin Hawking, NP  RABEprazole (ACIPHEX) 20 MG tablet Take 20 mg by mouth daily before breakfast.  03/08/15   [provider]  ramipril (ALTACE) 10 MG capsule Take 10 mg by mouth daily after breakfast.    [provider]  senna (SENOKOT) 8.6 MG TABS tablet Take 2 tablets (17.2 mg total) by mouth daily. 05/08/17   Earlie Server, MD  sertraline (ZOLOFT) 100 MG tablet Take 100 mg by mouth daily after breakfast.    [provider]  tamsulosin (FLOMAX) 0.4 MG CAPS capsule Take 0.8 mg by mouth daily after supper.  02/04/14   [provider]    Allergies Patient has no known  allergies.    Social History Social History  Substance Use Topics  . Smoking status: Former Smoker    Types: Cigarettes    Quit date: 04/07/2002  . Smokeless tobacco: Former Systems developer    Types: Snuff, Chew  . Alcohol use No    Review of Systems Patient denies headaches, rhinorrhea, blurry vision, numbness, shortness of breath, chest pain, edema, cough, abdominal pain, nausea, vomiting, diarrhea, dysuria, fevers, rashes or hallucinations unless otherwise stated above in HPI. ____________________________________________   PHYSICAL EXAM:  VITAL SIGNS: Vitals:   07/03/17 1400 07/03/17 1455  BP: (!) 160/92 140/70  Pulse:  82  Resp:  16  Temp:    SpO2:  97%    Constitutional: Alert and oriented.  in no acute distress. Eyes: Conjunctivae are normal.  Head: Atraumatic. Nose: No congestion/rhinnorhea. Mouth/Throat: Mucous membranes are moist.   Neck: No stridor. Painless ROM.  Cardiovascular: Normal rate, regular rhythm. Grossly normal heart sounds.  Good peripheral circulation. Respiratory: Normal respiratory effort.  No retractions. Lungs CTAB. Gastrointestinal: Soft and nontender. No distention. No abdominal bruits. No CVA tenderness. Genitourinary:  Musculoskeletal: No lower extremity tenderness nor edema.  No joint effusions. Neurologic:  Normal speech and language. No gross focal neurologic deficits are appreciated. No facial droop Skin:  Skin is warm, dry and intact. No rash noted. Psychiatric: Mood and affect are normal. Speech and behavior are normal.  ____________________________________________   LABS (all labs ordered are listed, but only abnormal results are displayed)  Results for orders placed or performed during the hospital encounter of 07/03/17 (from the past 24 hour(s))  Basic metabolic panel     Status: Abnormal   Collection Time: 07/03/17 11:16 AM  Result Value Ref Range   Sodium 135 135 - 145 mmol/L   Potassium 3.7 3.5 - 5.1 mmol/L   Chloride 97 (L)  101 - 111 mmol/L   CO2 25 22 - 32 mmol/L   Glucose, Bld 148 (H) 65 - 99 mg/dL   BUN 20 6 - 20 mg/dL   Creatinine, Ser 1.29 (H) 0.61 - 1.24 mg/dL   Calcium 9.1 8.9 - 10.3 mg/dL   GFR calc non Af Amer 53 (L) >60 mL/min   GFR calc Af Amer >60 >60 mL/min   Anion gap 13 5 - 15  CBC     Status: Abnormal   Collection Time: 07/03/17 11:16 AM  Result Value Ref Range   WBC 3.9 3.8 - 10.6 K/uL   RBC 4.00 (L) 4.40 - 5.90 MIL/uL   Hemoglobin 11.5 (L) 13.0 - 18.0 g/dL   HCT 34.0 (L) 40.0 - 52.0 %   MCV 84.9 80.0 - 100.0 fL   MCH 28.8 26.0 - 34.0 pg   MCHC 33.9 32.0 - 36.0 g/dL   RDW 16.8 (H) 11.5 - 14.5 %   Platelets 119 (L) 150 - 440 K/uL  Troponin I     Status: Abnormal   Collection Time: 07/03/17 11:16 AM  Result Value Ref Range   Troponin I 0.03 (HH) <0.03 ng/mL  Troponin I     Status: None   Collection Time: 07/03/17  1:57 PM  Result Value Ref Range   Troponin I <0.03 <0.03 ng/mL   ____________________________________________  EKG My review and personal interpretation at Time: 11:29   Indication: bradycardia  Rate: 80  Rhythm: sinus Axis: normal Other: normal intervals, no stemi, non specific st changes ____________________________________________  RADIOLOGY  I personally reviewed all radiographic images ordered to evaluate for the above acute complaints and reviewed radiology reports and findings.  These findings were personally discussed with the patient.  Please see medical record for radiology report.  ____________________________________________   PROCEDURES  Procedure(s) performed:  Procedures    Critical Care performed: no ____________________________________________   INITIAL IMPRESSION / ASSESSMENT AND PLAN / ED COURSE  Pertinent labs & imaging results that were available during my care of the patient were reviewed by me and considered in my medical decision making (see chart for details).  DDX: bradycardia, electrolyte abn, acs, chemo effect  Joseph Ritter is a 76 y.o. who presents to the ED with Chief complaint of feeling weak as well  as reported bradycardia in clinic today. Patient arrives well perfused and in no acute distress. Blood work sent for the above differential. We'll provide IV fluids. EKG shows no evidence of preexcitation syndrome. There are occasional PVCs but there is a normal rate. Denies any chest pain or shortness of breath.  The patient will be placed on continuous pulse oximetry and telemetry for monitoring.  Laboratory evaluation will be sent to evaluate for the above complaints.     Clinical Course as of Jul 04 1523  Tue Jul 03, 2017  1218 Blood work does show a mild ache AI. Troponin is borderline elevated. I do not believe this is consistent with ACS as he denies any chest pain or shortness of breath. We'll continue IV fluids for component of dehydration and reassess.  [PR]  1441 Patient maintains a symptomatic. He's been on the monitor for 3-1/2 hours here without any bradycardia or evidence of preexcitation syndrome. This coagulated patient is stable for follow-up with outpatient clinic. Patient encouraged to drink plenty of fluids. Denies any discomfort. It demonstrates understanding of signs and symptoms for which she should return immediately to the hospital.  [PR]    Clinical Course User Index [PR] Merlyn Lot, MD     ____________________________________________   FINAL CLINICAL IMPRESSION(S) / ED DIAGNOSES  Final diagnoses:  Dehydration  Lightheadedness      NEW MEDICATIONS STARTED DURING THIS VISIT:  New Prescriptions   No medications on file     Note:  This document was prepared using Dragon voice recognition software and may include unintentional dictation errors.    Merlyn Lot, MD 07/03/17 1525

## 2017-07-03 NOTE — Progress Notes (Signed)
[facility] Follow Up visit  Patient Care Team: [name], MD as PCP - General (Physician Assistant)  REASON FOR VISIT Treatment of Locally advanced kidney urothelial cancer  ONCOLOGY HISTORY Ana Liaw Malta 76 y.o. male with PMH listed as below  is referred to [place] for evaluation of locally advanced kidney urothelia cancer. Patient developed gross hematuria at the end of 2017. CT 08/2016 showed endophytic hypodense mass. Repeat image 01/2017 showed enlarged  endophytic hypodense mass right kidney. 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 Right robotic nephroureterectomy was attempted but aborted due to unresectable newly metastatic disease diagnosed intraoperatively. Postop he underwent a CT scan of the abdomen and pelvis which showed extension of an infiltrative mass from the right kidney along the right renal vein toward the IVC with encasement of the IVC.  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 He self catheterize a few time a day.  He reports having back pain which limits her ability to walk. Also constipated and abdominal bloating. His appetite is fair. He takes Norco for abdominal and back pain.  He can walk with walker for very short distance, mostly limited by back pain and fatigue./weakness.  # Images: CT chest and abdomen 05/03/2017 1. Pneumoperitoneum with a small amount of ascites, gas tracking along the thoracoabdominal wall into the scrotum, and a small amount of gas in the right perirenal space-these findings are likely related to the patient's attempted nephroureterectomy yesterday. Surveillance to ensure expected resolution of the pneumoperitoneum might be appropriate. 2. The right kidney lower pole infarct with lack of patency of the more inferior of the 3 right renal arteries. There is delayed nephrogram on the right with increase an infiltrative process in theright  kidney and extending into the right renal hilar vascular structures, including the right renal vein and the adjacent IVC, compatible with tumor thrombus. There is also soft tissue density surrounding the renal hilar vascular structures and extending into the retroperitoneum and periaortic region, some of which may be tumor and some of which may be hematoma.3. Demineralization of the anterior inferior L2 vertebral body,concerning for tumor invasion. This is near the level of the retroaortic left renal vein. 4. No findings of metastatic disease to the chest. There are new trace bilateral pleural effusions with mild passive atelectasis.  #Pathology: 05/02/2017 SPECIMEN SUBMITTED: A. Hilar tissue, right kidney B. Hilar tissue, right kidney DIAGNOSIS:  A and B. SOFT TISSUE, RIGHT RENAL HILUM  PD-L1 - Urothelial (TECENTRIQ) Immunohistochemistry Analysis  Result: 0% Interpretation: No expression  # Current Treatment: 1 Palliative RT to spine 2 Chemotherapy:     06/01/2017 Cycle 1 Gemcitabine and Carboplain.  Day 8 Gemcitabine was delayed due to intractable nausea and vomiting.     Chemotherapy related thrombocytopenia, no active bleeding. 50% dose of Gemcitabine was given on day 11.     06/25/2017 Cycle 2 Gemcitabine and Carboplain.    07/03/2017 Cycle 2 Day 8 gemcitabine planned   INTERVAL HISTORY 76 yo male who has above oncology history reviewed by me today presents for evaluation prior to cycle 2 Carboplatin and Gemcitabine.Today is for Day 8 gemcitabine. His complaints/symtoms listed as below: 1 Chemotherapy induced nausea: he takes Zofran, compazine, phenergan PRN. Also takes Ativan PRN. He still have intermittent nausea and vomit episodes which are improved with antiemetics.  2 Neoplasm related fatigue: still feel very tried, lack of energy   3 Neoplasm related pain, mostly on his  back, persistent, sharp in nature, he rate his pain right now is 5 out of 10, improved comparing to last visit. Marland Kitchen  His  current regimen is MS contin 5m BID, prednisone 135mdaily, 2 tabs of Norco 7.5/325 mg Q6 hours PRN.  4 Opioid associated constipation: takes senokot 8.77m77m tabs daily , his constipation is well controlled. 5 Dizziness: feels dizziness, lightheaded and weakness.   Review of Systems  Review of Systems  Constitutional: Negative for fever, night sweats,unintentional weight loss, Poor appetite. Weakness HENT: Negative for ear pain, hearing loss, nasal bleeding Eyes: Negative for eye pain, double vision   Respiratory: Negative for wheezing, shortness of breath, cough Cardiovascular: Negative for chest pain, palpitation.   Gastrointestinal: Negative abdominal pain, diarrhea, nausea vomiting Endocrine: Negative  Genitourinary: Negative for dysuria, hematuria, frequency. He self catheterizes.  Skin: Negative for rash, iching, bruising Neurological: Negative for headache, seizure. He feels lightheaded today and weak.  Hematological: Negative for easy bruising/bleeding, lymph node enlargement Psychiatric/Behavioral: Negative for depression, anxiety, suicidality  MEDICAL HISTORY: Past Medical History:  Diagnosis Date  . Acid indigestion 03/09/2014  . Adult hypothyroidism 11/18/2012  . Anxiety   . Arteriosclerosis of coronary artery 11/18/2012   Overview:  PATENT LIMA TO LAD, PATENT SVG TO PDA AND OCCULUDED SVG TO OM2 BY CATHERIZATION 02/09/2009   . 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 Borderline diabetes 05/25/2014  . BP (high blood pressure) 11/18/2012  . BPH (benign prostatic hypertrophy)   . CAFL (chronic airflow limitation) ([place]/04/2015  . CCF (congestive cardiac failure) ([facility]/07/2014   Overview:  HX OF   . Clinical depression 05/03/2015  . COPD (chronic obstructive pulmonary disease) ([place] . DDD (degenerative disc disease), lumbar 06/29/2014  . 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 Gastric ulcer 03/09/2014   . 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 History of urinary self-catheterization 2017  . HLD (hyperlipidemia) 01/03/2014  . Incomplete bladder emptying 11/18/2012  . Leg weakness 11/18/2012  . Lumbar radiculitis   . Neuritis or radiculitis due to rupture of lumbar intervertebral disc 06/29/2014  . Overactive bladder   . PONV (postoperative nausea and vomiting)    years ago with Ether, no problem with Nausea or vomiting with the last few surgerys  . Pre-diabetes   . Subacute transverse myelitis ([place]/27/2014  . Teeth problem    pt reports "bad teeth", "need to be pulled"    SURGICAL HISTORY: Past Surgical History:  Procedure Laterality Date  . ARTERY BIOPSY Right 12/16/2015   Procedure: BIOPSY TEMPORAL ARTERY;  Surgeon: JasAlgernon HuxleyD;  Location: [facility];  Service: Vascular;  Laterality: Right;  . CARDIAC CATHETERIZATION    . CATARACT EXTRACTION W/PHACO Left 01/26/2016   Procedure: CATARACT EXTRACTION PHACO AND INTRAOCULAR LENS PLACEMENT (IOC) LEFT EYE;  Surgeon: ChaLeandrew KoyanagiD;  [facility];  Laterality: Left;  . CORONARY ARTERY BYPASS GRAFT  04/07/2002   [facility]  . Cysto Bladder Botox Injection  07/02/2014  . CYSTOSCOPY W/ RETROGRADES Right 03/16/2017   Procedure: CYSTOSCOPY WITH RETROGRADE PYELOGRAM;  Surgeon: BudNickie RetortD;  Location: [facility];  Service: Urology;  Laterality: Right;  . CYSTOSCOPY WITH STENT PLACEMENT Right 03/16/2017   Procedure: CYSTOSCOPY WITH STENT PLACEMENT;  Surgeon: BudNickie RetortD;  Location: [facility];  Service: Urology;  Laterality:  Right;  Marland Kitchen EYE SURGERY Left    blood clot behind left eye  . HAND SURGERY Bilateral   . HERNIA REPAIR Right    inguinal hernia repair  . LEFT HEART CATH AND CORS/GRAFTS ANGIOGRAPHY N/A 04/25/2017   Procedure: Left Heart Cath and Cors/Grafts Angiography;  Surgeon: Isaias Cowman, MD;  Location:  [place] [facility];  Service: Cardiovascular;  Laterality: N/A;  . PORTA CATH INSERTION N/A 05/30/2017   Procedure: Glori Luis Cath Insertion;  Surgeon: Katha Cabal, MD;  Location: [place] [facility];  Service: Cardiovascular;  Laterality: N/A;  . ROBOT ASSITED LAPAROSCOPIC NEPHROURETERECTOMY Right 05/02/2017   Procedure: ROBOT ASSITED LAPAROSCOPIC NEPHROURETERECTOMY ATTEMPTED;  Surgeon: [name], MD;  Location: [facility];  Service: Urology;  Laterality: Right;  . URETEROSCOPY Right 03/16/2017   Procedure: URETEROSCOPY BIOPSY RENAL MASS;  Surgeon: [name], MD;  Location: [facility];  Service: Urology;  Laterality: Right;    SOCIAL HISTORY: Social History   Social History  . Marital status: Married    Spouse name: N/A  . Number of children: N/A  . Years of education: N/A   Occupational History  . Not on file.   Social History Main Topics  . Smoking status: Former Smoker    Types: Cigarettes    Quit date: 04/07/2002  . Smokeless tobacco: Former Systems developer    Types: Snuff, Chew  . Alcohol use No  . Drug use: No  . Sexual activity: Not on file   Other Topics Concern  . Not on file   Social History Narrative  . No narrative on file    FAMILY HISTORY Family History  Problem Relation Age of Onset  . Heart attack Mother   . Heart disease Father   . Prostate cancer Neg Hx   . Bladder Cancer Neg Hx   . Kidney cancer Neg Hx     ALLERGIES:  has No Known Allergies.  MEDICATIONS:  Current Outpatient Prescriptions  Medication Sig Dispense Refill  . acetaminophen (TYLENOL) 500 MG tablet Take 500 mg by mouth 2 (two) times daily as needed (for pain.).     Marland Kitchen aspirin EC 81 MG tablet Take 81 mg by mouth daily after breakfast.     . buPROPion (WELLBUTRIN SR) 100 MG 12 hr tablet TAKE ONE TABLET BY MOUTH EVERY DAY    . cyclobenzaprine (FLEXERIL) 10 MG tablet Take 10 mg by mouth at bedtime as needed for muscle spasms.    Marland Kitchen ezetimibe-simvastatin (VYTORIN) 10-40 MG tablet  Take 1 tablet by mouth at bedtime.    Marland Kitchen HYDROcodone-acetaminophen (NORCO) 7.5-325 MG tablet Take 1 tablet by mouth every 4 (four) hours as needed for moderate pain. 180 tablet 0  . 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    Marland Kitchen ketoconazole (NIZORAL) 2 % shampoo APPLY SHAMPOO TO HAIR AS NEEDED FOR FLAKY/ITCHY SCALP (TYPICALLY EVERY 2-3 DAYS)    . levothyroxine (SYNTHROID, LEVOTHROID) 100 MCG tablet Take 100 mcg by mouth daily before breakfast.    . lidocaine (LIDODERM) 5 % Place 1 patch onto the skin every 12 (twelve) hours. Remove & Discard patch within 12 hours or as directed by MD 60 patch 11  . lidocaine-prilocaine (EMLA) cream Apply to affected area once 30 g 3  . LORazepam (ATIVAN) 0.5 MG tablet Take 1 tablet (0.5 mg total) by mouth every 8 (eight) hours as needed for anxiety (nausea).  30 tablet 0  . Methylcellulose, Laxative, (CITRUCEL PO) Take 1 tablet by mouth daily after breakfast.    . morphine (MS CONTIN) 30 MG 12 hr tablet Take 30 mg by mouth every 12 (twelve) hours.    . Multiple Vitamin (MULTIVITAMIN WITH MINERALS) TABS tablet Take 1 tablet by mouth daily after breakfast. CENTRUM SILVER    . ondansetron (ZOFRAN) 8 MG tablet Take 1 tablet (8 mg total) by mouth 2 (two) times daily as needed for refractory nausea / vomiting. Start on day 3 after carboplatin chemo. 30 tablet 1  . PEPPERMINT OIL PO Take 1 capsule by mouth 3 (three) times daily as needed (for IBS). IBgard (active ingredient: 90 mg ultrapurified peppermint oil)    . polyethylene glycol powder (GLYCOLAX/MIRALAX) powder Take 17 g by mouth daily after breakfast. Mix with glass of water 255 g 1  . predniSONE (DELTASONE) 10 MG tablet Take 1 tablet (10 mg total) by mouth daily with breakfast. 30 tablet 0  . Probiotic Product (PROBIOTIC PO) Take 1 capsule by mouth daily as needed (for digestive health (see  instructions)). AFTER BREAKFAST EITHER TAKE A PROBIOTIC OR EAT YOGURT    . prochlorperazine (COMPAZINE) 10 MG tablet Take 1 tablet (10 mg total) by mouth every 6 (six) hours as needed (Nausea or vomiting). 30 tablet 1  . promethazine (PHENERGAN) 25 MG tablet Take 1 tablet (25 mg total) by mouth every 6 (six) hours as needed for nausea or vomiting. 30 tablet 0  . RABEprazole (ACIPHEX) 20 MG tablet Take 20 mg by mouth daily before breakfast.     . ramipril (ALTACE) 10 MG capsule Take 10 mg by mouth daily after breakfast.    . senna (SENOKOT) 8.6 MG TABS tablet Take 2 tablets (17.2 mg total) by mouth daily. 120 each 0  . sertraline (ZOLOFT) 100 MG tablet Take 100 mg by mouth daily after breakfast.    . tamsulosin (FLOMAX) 0.4 MG CAPS capsule Take 0.8 mg by mouth daily after supper.      No current facility-administered medications for this visit.    Facility-Administered Medications Ordered in Other Visits  Medication Dose Route Frequency Provider Last Rate Last Dose  . heparin lock flush 100 unit/mL  500 Units Intravenous Once Earlie Server, MD      . sodium chloride flush (NS) 0.9 % injection 10 mL  10 mL Intravenous PRN Earlie Server, MD   10 mL at 07/03/17 0930    PHYSICAL EXAMINATION:  ECOG PERFORMANCE STATUS: 2 - Symptomatic, <50% confined to bed  Vitals:   07/03/17 1015  BP: (!) 120/56  Pulse: (!) 41  Temp: (!) 96.6 F (35.9 C)    Filed Weights   07/03/17 1015  Weight: 142 lb 9 oz (64.7 kg)     Physical Exam GENERAL: No distress, well nourished.  SKIN:  No rashes or significant lesions  HEAD: Normocephalic, No masses, lesions, tenderness or abnormalities  EYES: Conjunctiva are pink, non icteric ENT: External ears normal ,lips , buccal mucosa, and tongue normal and mucous membranes are moist  LYMPH: No palpable cervical and axillary lymphadenopathy  LUNGS: Clear to auscultation, no crackles or wheezes HEART: bradycardia, and irregular rhythm, no murmurs, no gallops, S1 normal and  S2 normal  ABDOMEN: Abdomen soft, non-tender, normal bowel sounds, I did not appreciate any  masses or organomegaly  MUSCULOSKELETAL: No CVA tenderness and no tenderness on percussion of the back or rib cage.  EXTREMITIES: No edema, no skin discoloration or tenderness NEURO: Alert &  oriented,  Alert & oriented, lower extremity strength 3/5 bilaterllay. Normal upper extremity strength.    LABORATORY DATA: I have personally reviewed the data as listed: CBC    Component Value Date/Time   WBC 3.1 (L) 07/03/2017 0932   RBC 3.71 (L) 07/03/2017 0932   HGB 10.6 (L) 07/03/2017 0932   HGB 15.3 11/15/2012 1641   HCT 31.6 (L) 07/03/2017 0932   HCT 43.7 11/15/2012 1641   PLT 123 (L) 07/03/2017 0932   PLT 166 11/15/2012 1641   MCV 85.1 07/03/2017 0932   MCV 89 11/15/2012 1641   MCH 28.4 07/03/2017 0932   MCHC 33.4 07/03/2017 0932   RDW 16.0 (H) 07/03/2017 0932   RDW 13.3 11/15/2012 1641   LYMPHSABS 0.6 (L) 07/03/2017 0932   LYMPHSABS 1.7 11/15/2012 1641   MONOABS 0.3 07/03/2017 0932   MONOABS 0.8 11/15/2012 1641   EOSABS 0.1 07/03/2017 0932   EOSABS 0.1 11/15/2012 1641   BASOSABS 0.0 07/03/2017 0932   BASOSABS 0.0 11/15/2012 1641   CMP Latest Ref Rng & Units 07/03/2017 06/25/2017 06/14/2017  Glucose 65 - 99 mg/dL 136(H) 127(H) 118(H)  BUN 6 - 20 mg/dL 18 27(H) 21(H)  Creatinine 0.61 - 1.24 mg/dL 1.14 0.91 1.04  Sodium 135 - 145 mmol/L 134(L) 131(L) 134(L)  Potassium 3.5 - 5.1 mmol/L 3.7 4.6 4.0  Chloride 101 - 111 mmol/L 96(L) 96(L) 95(L)  CO2 22 - 32 mmol/L 26 25 28   Calcium 8.9 - 10.3 mg/dL 8.9 9.0 9.4  Total Protein 6.5 - 8.1 g/dL 6.7 7.0 7.1  Total Bilirubin 0.3 - 1.2 mg/dL 0.4 0.4 0.8  Alkaline Phos 38 - 126 U/L 110 169(H) 133(H)  AST 15 - 41 U/L 31 35 50(H)  ALT 17 - 63 U/L 44 79(H) 70(H)     RADIOGRAPHIC STUDIES: I have personally reviewed the radiological images as listed and agree with the findings in the report 5.  Aortic Atherosclerosis (ICD10-I70.0).  Coronary  atherosclerosis. 6. Inflammatory stranding from the right perirenal space extends down into the pelvis and crosses the midline. There is presacral edema. 7. 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 Recommend followup by ultrasound in 3 years. This recommendation follows ACR consensus guidelines: White Paper of the ACR Incidental Findings Committee II on Vascular Findings. J Am Coll Radiol 2013; 67:341-937 8. Wall thickening along the left side of the urinary bladder may be incidental, but synchronous sessile transitional cell carcinoma is not readily excluded.  CT abdomen wo contrast 02/09/2017  Increased size of 4.6 cm ill-defined hypoenhancing mass ininterpolar region of right kidney, with obstruction of upper pole collecting system. This does not have typical appearance for pyelonephritis or renal cell carcinoma, and raises suspicion for urothelial carcinoma. Consider ureteroscopy for further evaluation. No evidence of metastatic disease. Stable mildly enlarged prostate and mild diffuse bladder wall thickening. Stable 3.0 cm infrarenal abdominal aortic aneurysm. Recommend followup by ultrasound in 3 years. This recommendation follows ACR consensus guidelines: White Paper of the ACR Incidental Findings Committee II on Vascular Findings. [place] [id number]. Colonic diverticulosis. No radiographic evidence of diverticulitis. Stable small fat-containing left inguinal hernia. MRI thoracic and lumbar w/wo contrast.  1. Malignant invasion of the L2 vertebral body by the adjacent retroperitoneal mass. No pathologic fracture or osseous metastatic disease elsewhere in the thoracolumbar spine. 2. Areas of epidural contrast enhancement of the lumbar spine are most consistent with a dilated epidural venous plexus secondary to invasion of the inferior vena cava by the patient's urothelial tumor.  3. Moderate L4-L5 multifactorial spinal canal stenosis with  severe right and mild-to-moderate left neural foraminal stenosis. 4. Moderate right and severe left multifactorial L5-S1 neural foraminal stenosis. There is also severe attenuation of the thecal sac at this level but no bony spinal canal stenosis. 5. No spinal canal or neural foraminal stenosis in the thoracic spine.   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  Cancer Staging Transitional cell carcinoma of kidney, right [facility]) Staging form: Kidney, AJCC 8th Edition - Clinical stage from 05/08/2017: Stage IV (cT3, cNX, cM1) - Signed by Earlie Server, MD on 05/08/2017  1. Bradycardia   2. Transitional cell carcinoma of kidney, right ([place])   3. 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 CKD (chronic kidney disease) stage 3, GFR 30-59 ml/min (HCC)   5. Chemotherapy induced nausea and vomiting   6. Goals of care, counseling/discussion   7. Encounter for antineoplastic chemotherapy   8. Chemotherapy-induced thrombocytopenia    1, Symptomatic bradycardia, lightheaded: Hold today's treatment. Given his significant cardiac history (CABG in 2003), will send him to ER for further evaluation. Called triage.  2, 3, S/P Cycle 1 Gemcitabine and Carboplain. Hold today's chemotherapy due to symptomatic bradycardia, irregular heartbeats. Tentatively resume treatment in 1 week.   4 Chemotherapy induced nausea and vomiting: improved.I discussed with antiemetics with patient and his wife. phenergan PRN (refilled), zofran PRN, compazine PRN and ativan PRN.  MRI brain negative for CNS involvement.   5 Neoplasm related pain: Continue MS contin 14m BID and  use 2 tablets of Norco 7.5/325mg Q6h PRN and pain seems to be better controlled. Continue low dose prednisone 159mdaily (refilled) to palliate the bone  pain. 6  Constipation: continue current bowel regimen   7  Fatigue is likely due to chemotherapy.  # hold Xgeva Q28 days for metastatic bone disease. Patient has poor dentition. Ask him to obtain dental clearance.    All questions were answered. The patient knows to call the clinic with any problems, questions or concerns. Follow up in 1 week.   ZhEarlie ServerMD  07/03/2017 10:15 AM

## 2017-07-03 NOTE — ED Triage Notes (Signed)
Pt states that yesterday he started feeling weak and dizzy - today when he went for chemo that stated he was bradycardic and refused to give treatment until after he was cleared by ED - at this time pt only reports dizziness but pulse is 85-90

## 2017-07-03 NOTE — ED Notes (Signed)
Dr Quentin Cornwall notified of elevated troponin of 0.03 - no new orders at this time

## 2017-07-03 NOTE — ED Notes (Signed)
Pt port accessed in right chest wall by cancer center

## 2017-07-03 NOTE — ED Notes (Signed)
Pt states he self caths self when he has to void - advised pt that sample was needed the next time he self cath'd

## 2017-07-06 ENCOUNTER — Emergency Department: Payer: Medicare Other

## 2017-07-06 ENCOUNTER — Encounter: Payer: Self-pay | Admitting: Emergency Medicine

## 2017-07-06 ENCOUNTER — Other Ambulatory Visit: Payer: Self-pay

## 2017-07-06 ENCOUNTER — Inpatient Hospital Stay
Admission: EM | Admit: 2017-07-06 | Discharge: 2017-07-10 | DRG: 687 | Disposition: A | Discharge status: Home Health | Payer: Medicare Other | Attending: Internal Medicine | Admitting: Internal Medicine

## 2017-07-06 DIAGNOSIS — N179 Acute kidney failure, unspecified: Secondary | ICD-10-CM | POA: Diagnosis present

## 2017-07-06 DIAGNOSIS — Z7982 Long term (current) use of aspirin: Secondary | ICD-10-CM

## 2017-07-06 DIAGNOSIS — Z8711 Personal history of peptic ulcer disease: Secondary | ICD-10-CM

## 2017-07-06 DIAGNOSIS — R339 Retention of urine, unspecified: Secondary | ICD-10-CM | POA: Diagnosis present

## 2017-07-06 DIAGNOSIS — E860 Dehydration: Secondary | ICD-10-CM | POA: Diagnosis not present

## 2017-07-06 DIAGNOSIS — Z66 Do not resuscitate: Secondary | ICD-10-CM | POA: Diagnosis present

## 2017-07-06 DIAGNOSIS — Z79891 Long term (current) use of opiate analgesic: Secondary | ICD-10-CM

## 2017-07-06 DIAGNOSIS — Z79899 Other long term (current) drug therapy: Secondary | ICD-10-CM

## 2017-07-06 DIAGNOSIS — Z87891 Personal history of nicotine dependence: Secondary | ICD-10-CM

## 2017-07-06 DIAGNOSIS — Z960 Presence of urogenital implants: Secondary | ICD-10-CM | POA: Diagnosis present

## 2017-07-06 DIAGNOSIS — N390 Urinary tract infection, site not specified: Secondary | ICD-10-CM | POA: Diagnosis present

## 2017-07-06 DIAGNOSIS — Z951 Presence of aortocoronary bypass graft: Secondary | ICD-10-CM

## 2017-07-06 DIAGNOSIS — R109 Unspecified abdominal pain: Secondary | ICD-10-CM | POA: Diagnosis present

## 2017-07-06 DIAGNOSIS — D61818 Other pancytopenia: Secondary | ICD-10-CM | POA: Diagnosis present

## 2017-07-06 DIAGNOSIS — G373 Acute transverse myelitis in demyelinating disease of central nervous system: Secondary | ICD-10-CM | POA: Diagnosis present

## 2017-07-06 DIAGNOSIS — F329 Major depressive disorder, single episode, unspecified: Secondary | ICD-10-CM | POA: Diagnosis present

## 2017-07-06 DIAGNOSIS — C641 Malignant neoplasm of right kidney, except renal pelvis: Secondary | ICD-10-CM | POA: Diagnosis not present

## 2017-07-06 DIAGNOSIS — N3281 Overactive bladder: Secondary | ICD-10-CM | POA: Diagnosis present

## 2017-07-06 DIAGNOSIS — I248 Other forms of acute ischemic heart disease: Secondary | ICD-10-CM | POA: Diagnosis present

## 2017-07-06 DIAGNOSIS — I509 Heart failure, unspecified: Secondary | ICD-10-CM | POA: Diagnosis present

## 2017-07-06 DIAGNOSIS — A419 Sepsis, unspecified organism: Secondary | ICD-10-CM | POA: Diagnosis present

## 2017-07-06 DIAGNOSIS — T451X5A Adverse effect of antineoplastic and immunosuppressive drugs, initial encounter: Secondary | ICD-10-CM | POA: Diagnosis present

## 2017-07-06 DIAGNOSIS — N280 Ischemia and infarction of kidney: Secondary | ICD-10-CM | POA: Diagnosis present

## 2017-07-06 DIAGNOSIS — Z515 Encounter for palliative care: Secondary | ICD-10-CM | POA: Diagnosis not present

## 2017-07-06 DIAGNOSIS — E039 Hypothyroidism, unspecified: Secondary | ICD-10-CM | POA: Diagnosis present

## 2017-07-06 DIAGNOSIS — N183 Chronic kidney disease, stage 3 (moderate): Secondary | ICD-10-CM | POA: Diagnosis present

## 2017-07-06 DIAGNOSIS — Z7952 Long term (current) use of systemic steroids: Secondary | ICD-10-CM

## 2017-07-06 DIAGNOSIS — I130 Hypertensive heart and chronic kidney disease with heart failure and stage 1 through stage 4 chronic kidney disease, or unspecified chronic kidney disease: Secondary | ICD-10-CM | POA: Diagnosis present

## 2017-07-06 DIAGNOSIS — E785 Hyperlipidemia, unspecified: Secondary | ICD-10-CM | POA: Diagnosis present

## 2017-07-06 DIAGNOSIS — B401 Chronic pulmonary blastomycosis: Secondary | ICD-10-CM | POA: Diagnosis present

## 2017-07-06 DIAGNOSIS — K279 Peptic ulcer, site unspecified, unspecified as acute or chronic, without hemorrhage or perforation: Secondary | ICD-10-CM | POA: Diagnosis present

## 2017-07-06 DIAGNOSIS — I2510 Atherosclerotic heart disease of native coronary artery without angina pectoris: Secondary | ICD-10-CM | POA: Diagnosis present

## 2017-07-06 DIAGNOSIS — R112 Nausea with vomiting, unspecified: Secondary | ICD-10-CM

## 2017-07-06 DIAGNOSIS — J449 Chronic obstructive pulmonary disease, unspecified: Secondary | ICD-10-CM | POA: Diagnosis present

## 2017-07-06 DIAGNOSIS — Z5329 Procedure and treatment not carried out because of patient's decision for other reasons: Secondary | ICD-10-CM | POA: Diagnosis not present

## 2017-07-06 DIAGNOSIS — E878 Other disorders of electrolyte and fluid balance, not elsewhere classified: Secondary | ICD-10-CM | POA: Diagnosis present

## 2017-07-06 DIAGNOSIS — Z923 Personal history of irradiation: Secondary | ICD-10-CM

## 2017-07-06 DIAGNOSIS — N319 Neuromuscular dysfunction of bladder, unspecified: Secondary | ICD-10-CM | POA: Diagnosis present

## 2017-07-06 DIAGNOSIS — R1030 Lower abdominal pain, unspecified: Secondary | ICD-10-CM

## 2017-07-06 DIAGNOSIS — I82220 Acute embolism and thrombosis of inferior vena cava: Secondary | ICD-10-CM

## 2017-07-06 DIAGNOSIS — K5900 Constipation, unspecified: Secondary | ICD-10-CM | POA: Diagnosis present

## 2017-07-06 DIAGNOSIS — F419 Anxiety disorder, unspecified: Secondary | ICD-10-CM | POA: Diagnosis present

## 2017-07-06 DIAGNOSIS — D49511 Neoplasm of unspecified behavior of right kidney: Secondary | ICD-10-CM

## 2017-07-06 DIAGNOSIS — M5116 Intervertebral disc disorders with radiculopathy, lumbar region: Secondary | ICD-10-CM | POA: Diagnosis present

## 2017-07-06 DIAGNOSIS — I472 Ventricular tachycardia: Secondary | ICD-10-CM | POA: Diagnosis not present

## 2017-07-06 DIAGNOSIS — R197 Diarrhea, unspecified: Secondary | ICD-10-CM

## 2017-07-06 HISTORY — DX: Malignant (primary) neoplasm, unspecified: C80.1

## 2017-07-06 LAB — COMPREHENSIVE METABOLIC PANEL
ALBUMIN: 3.7 g/dL (ref 3.5–5.0)
ALT: 43 U/L (ref 17–63)
AST: 45 U/L — AB (ref 15–41)
Alkaline Phosphatase: 111 U/L (ref 38–126)
Anion gap: 16 — ABNORMAL HIGH (ref 5–15)
BILIRUBIN TOTAL: 0.6 mg/dL (ref 0.3–1.2)
BUN: 14 mg/dL (ref 6–20)
CO2: 25 mmol/L (ref 22–32)
CREATININE: 1.64 mg/dL — AB (ref 0.61–1.24)
Calcium: 9.6 mg/dL (ref 8.9–10.3)
Chloride: 94 mmol/L — ABNORMAL LOW (ref 101–111)
GFR calc Af Amer: 46 mL/min — ABNORMAL LOW (ref >60)
GFR, EST NON AFRICAN AMERICAN: 40 mL/min — AB (ref >60)
GLUCOSE: 202 mg/dL — AB (ref 65–99)
POTASSIUM: 4.6 mmol/L (ref 3.5–5.1)
Sodium: 135 mmol/L (ref 135–145)
TOTAL PROTEIN: 7.5 g/dL (ref 6.5–8.1)

## 2017-07-06 LAB — CBC WITH DIFFERENTIAL/PLATELET
BASOS ABS: 0 10*3/uL (ref 0–0.1)
BASOS PCT: 0 %
EOS ABS: 0 10*3/uL (ref 0–0.7)
Eosinophils Relative: 0 %
HCT: 36.9 % — ABNORMAL LOW (ref 40.0–52.0)
HEMOGLOBIN: 12.5 g/dL — AB (ref 13.0–18.0)
LYMPHS ABS: 0.7 10*3/uL — AB (ref 1.0–3.6)
Lymphocytes Relative: 7 %
MCH: 29 pg (ref 26.0–34.0)
MCHC: 33.7 g/dL (ref 32.0–36.0)
MCV: 86 fL (ref 80.0–100.0)
Monocytes Absolute: 0.6 10*3/uL (ref 0.2–1.0)
Monocytes Relative: 7 %
NEUTROS ABS: 7.8 10*3/uL — AB (ref 1.4–6.5)
NEUTROS PCT: 86 %
Platelets: 92 10*3/uL — ABNORMAL LOW (ref 150–440)
RBC: 4.29 MIL/uL — AB (ref 4.40–5.90)
RDW: 16.9 % — ABNORMAL HIGH (ref 11.5–14.5)
WBC: 9 10*3/uL (ref 3.8–10.6)

## 2017-07-06 LAB — APTT: APTT: 29 s (ref 24–36)

## 2017-07-06 LAB — URINALYSIS, COMPLETE (UACMP) WITH MICROSCOPIC
BILIRUBIN URINE: NEGATIVE
Glucose, UA: NEGATIVE mg/dL
Ketones, ur: NEGATIVE mg/dL
Nitrite: NEGATIVE
PROTEIN: 30 mg/dL — AB
SPECIFIC GRAVITY, URINE: 1.012 (ref 1.005–1.030)
Squamous Epithelial / LPF: NONE SEEN
pH: 6 (ref 5.0–8.0)

## 2017-07-06 LAB — LACTIC ACID, PLASMA
LACTIC ACID, VENOUS: 4.3 mmol/L — AB (ref 0.5–1.9)
Lactic Acid, Venous: 1.7 mmol/L (ref 0.5–1.9)

## 2017-07-06 LAB — TROPONIN I: Troponin I: 0.11 ng/mL (ref <0.03)

## 2017-07-06 LAB — LIPASE, BLOOD: Lipase: 15 U/L (ref 11–51)

## 2017-07-06 LAB — PROCALCITONIN: PROCALCITONIN: 0.1 ng/mL

## 2017-07-06 LAB — PROTIME-INR
INR: 1.02
Prothrombin Time: 13.3 seconds (ref 11.4–15.2)

## 2017-07-06 MED ORDER — VANCOMYCIN HCL 10 G IV SOLR
1250.0000 mg | INTRAVENOUS | Status: DC
  Administered 2017-07-07: 1250 mg via INTRAVENOUS
  Filled 2017-07-06 (×2): qty 1250

## 2017-07-06 MED ORDER — IOPAMIDOL (ISOVUE-300) INJECTION 61%
75.0000 mL | Freq: Once | INTRAVENOUS | Status: AC | PRN
  Administered 2017-07-06: 75 mL via INTRAVENOUS

## 2017-07-06 MED ORDER — PIPERACILLIN-TAZOBACTAM 3.375 G IVPB
3.3750 g | Freq: Three times a day (TID) | INTRAVENOUS | Status: DC
  Administered 2017-07-07 – 2017-07-08 (×5): 3.375 g via INTRAVENOUS
  Filled 2017-07-06 (×5): qty 50

## 2017-07-06 MED ORDER — HYDROMORPHONE HCL 1 MG/ML IJ SOLN
1.0000 mg | Freq: Once | INTRAMUSCULAR | Status: AC
  Administered 2017-07-06: 1 mg via INTRAVENOUS
  Filled 2017-07-06: qty 1

## 2017-07-06 MED ORDER — SODIUM CHLORIDE 0.9 % IV BOLUS (SEPSIS)
1000.0000 mL | Freq: Once | INTRAVENOUS | Status: AC
  Administered 2017-07-06: 1000 mL via INTRAVENOUS

## 2017-07-06 MED ORDER — HYDROMORPHONE HCL 1 MG/ML IJ SOLN
1.0000 mg | Freq: Once | INTRAMUSCULAR | Status: AC
  Administered 2017-07-06: 1 mg via INTRAVENOUS

## 2017-07-06 MED ORDER — ONDANSETRON HCL 4 MG/2ML IJ SOLN
4.0000 mg | Freq: Once | INTRAMUSCULAR | Status: AC
  Administered 2017-07-06: 4 mg via INTRAVENOUS
  Filled 2017-07-06: qty 2

## 2017-07-06 MED ORDER — PIPERACILLIN-TAZOBACTAM 3.375 G IVPB 30 MIN
3.3750 g | Freq: Once | INTRAVENOUS | Status: AC
  Administered 2017-07-06: 3.375 g via INTRAVENOUS

## 2017-07-06 MED ORDER — VANCOMYCIN HCL IN DEXTROSE 1-5 GM/200ML-% IV SOLN
1000.0000 mg | Freq: Once | INTRAVENOUS | Status: AC
  Administered 2017-07-06: 1000 mg via INTRAVENOUS
  Filled 2017-07-06: qty 200

## 2017-07-06 MED ORDER — HYDROMORPHONE HCL 1 MG/ML IJ SOLN
INTRAMUSCULAR | Status: AC
Start: 2017-07-06 — End: 2017-07-06
  Administered 2017-07-06: 1 mg via INTRAVENOUS
  Filled 2017-07-06: qty 1

## 2017-07-06 MED ORDER — PIPERACILLIN-TAZOBACTAM 3.375 G IVPB 30 MIN
INTRAVENOUS | Status: AC
  Filled 2017-07-06: qty 50

## 2017-07-06 NOTE — ED Provider Notes (Signed)
[facility]: [facility] Emergency Department Provider Note  ____________________________________________  Time seen: Approximately 10:32 PM  I have reviewed the triage vital signs and the nursing notes.   HISTORY  Chief Complaint Emesis    HPI Joseph Ritter is a 76 y.o. male comes to the ED complaining of nausea vomiting diarrhea. Also dizziness but no syncope. No chest pain or shortness of breath. Has a history of kidney cancer, on chemotherapy and radiation. Has diffuse lower abdominal pain, constant, waxing and waning, worse with movement, no alleviating factors, nonradiating.     Past Medical History:  Diagnosis Date  . Acid indigestion 03/09/2014  . Adult hypothyroidism 11/18/2012  . Anxiety   . Arteriosclerosis of coronary artery 11/18/2012   Overview:  PATENT LIMA TO LAD, PATENT SVG TO PDA AND OCCULUDED SVG TO OM2 BY CATHERIZATION 02/09/2009   . 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 Borderline diabetes 05/25/2014  . BP (high blood pressure) 11/18/2012  . BPH (benign prostatic hypertrophy)   . CAFL (chronic airflow limitation) ([place]) 05/03/2015  . Cancer ([place])    renal cancer with mets  . CCF (congestive cardiac failure) ([place]) 01/03/2014   Overview:  HX OF   . Clinical depression 05/03/2015  . COPD (chronic obstructive pulmonary disease) ([place])   . DDD (degenerative disc disease), lumbar 06/29/2014  . 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 Gastric ulcer 03/09/2014  . 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 History of urinary self-catheterization 2017  . HLD (hyperlipidemia) 01/03/2014  . Incomplete bladder emptying 11/18/2012  . Leg weakness 11/18/2012  . Lumbar radiculitis   . Neuritis or radiculitis due to rupture of lumbar intervertebral disc 06/29/2014  . Overactive bladder   . PONV (postoperative nausea and vomiting)     years ago with Ether, no problem with Nausea or vomiting with the last few surgerys  . Pre-diabetes   . 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 Teeth problem    pt reports "bad teeth", "need to be pulled"     Patient Active Problem List   Diagnosis Date Noted  . Goals of care, counseling/discussion 05/25/2017  . 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 CKD (chronic kidney disease) stage 3, GFR 30-59 ml/min (HCC) 05/08/2017  . 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 Right renal mass 02/27/2017  . Gross hematuria 08/15/2016  . Dysuria 08/15/2016  . Acute cystitis with hematuria 08/15/2016  . CAFL (chronic airflow limitation) ([place]) 05/03/2015  . Clinical depression 05/03/2015  . 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 DDD (degenerative disc disease), lumbar 06/29/2014  . Neuritis or radiculitis due to rupture of lumbar intervertebral disc 06/29/2014  . 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 Borderline diabetes 05/25/2014  . Blood glucose elevated 05/25/2014  . Acid indigestion 03/09/2014  . Gastric ulcer 03/09/2014  . CCF (congestive cardiac failure) ([place]) 01/03/2014  . HLD (hyperlipidemia) 01/03/2014  . 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 Arteriosclerosis of coronary artery 11/18/2012  . BP (high blood pressure) 11/18/2012  . Adult hypothyroidism 11/18/2012  . Incomplete bladder emptying 11/18/2012  . Leg weakness 11/18/2012  . H/O coronary artery bypass surgery 04/07/2002     Past Surgical History:  Procedure Laterality Date  . ARTERY BIOPSY Right 12/16/2015  Procedure: BIOPSY TEMPORAL ARTERY;  Surgeon: Algernon Huxley, MD;  Location: [facility];  Service: Vascular;  Laterality: Right;  . CARDIAC CATHETERIZATION    . CATARACT EXTRACTION W/PHACO Left 01/26/2016   Procedure: CATARACT EXTRACTION PHACO AND INTRAOCULAR LENS PLACEMENT  (IOC) LEFT EYE;  Surgeon: Leandrew Koyanagi, MD;  Location: [place];  Service: Ophthalmology;  Laterality: Left;  . CORONARY ARTERY BYPASS GRAFT  04/07/2002   [facility]  . Cysto Bladder Botox Injection  07/02/2014  . CYSTOSCOPY W/ RETROGRADES Right 03/16/2017   Procedure: CYSTOSCOPY WITH RETROGRADE PYELOGRAM;  Surgeon: [name], MD;  Location: [facility];  Service: Urology;  Laterality: Right;  . CYSTOSCOPY WITH STENT PLACEMENT Right 03/16/2017   Procedure: CYSTOSCOPY WITH STENT PLACEMENT;  Surgeon: [name], MD;  Location: [facility];  Service: Urology;  Laterality: Right;  . EYE SURGERY Left    blood clot behind left eye  . HAND SURGERY Bilateral   . HERNIA REPAIR Right    inguinal hernia repair  . LEFT HEART CATH AND CORS/GRAFTS ANGIOGRAPHY N/A 04/25/2017   Procedure: Left Heart Cath and Cors/Grafts Angiography;  Surgeon: Isaias Cowman, MD;  Location: [place] [facility];  Service: Cardiovascular;  Laterality: N/A;  . PORTA CATH INSERTION N/A 05/30/2017   Procedure: Glori Luis Cath Insertion;  Surgeon: Katha Cabal, MD;  Location: [place] [facility];  Service: Cardiovascular;  Laterality: N/A;  . ROBOT ASSITED LAPAROSCOPIC NEPHROURETERECTOMY Right 05/02/2017   Procedure: ROBOT ASSITED LAPAROSCOPIC NEPHROURETERECTOMY ATTEMPTED;  Surgeon: [name], MD;  Location: [facility];  Service: Urology;  Laterality: Right;  . URETEROSCOPY Right 03/16/2017   Procedure: URETEROSCOPY BIOPSY RENAL MASS;  Surgeon: [name], MD;  Location: [facility];  Service: Urology;  Laterality: Right;     Prior to Admission medications   Medication Sig Start Date End Date Taking? Authorizing Provider  acetaminophen (TYLENOL) 500 MG tablet Take 500 mg by mouth 2 (two) times daily as needed (for pain.).    Yes [provider]  aspirin EC 81 MG tablet Take 81 mg by mouth daily.   Yes [provider]  buPROPion (WELLBUTRIN SR) 100 MG 12 hr tablet TAKE ONE  TABLET BY MOUTH EVERY DAY 05/29/17  Yes [provider]  cyclobenzaprine (FLEXERIL) 10 MG tablet Take 10 mg by mouth at bedtime as needed for muscle spasms.   Yes [provider]  ezetimibe-simvastatin (VYTORIN) 10-40 MG tablet Take 1 tablet by mouth at bedtime.   Yes [provider]  HYDROcodone-acetaminophen (NORCO) 7.5-325 MG tablet Take 1 tablet by mouth every 4 (four) hours as needed for moderate pain. Patient taking differently: Take 2 tablets by mouth every 6 (six) hours as needed for moderate pain.  06/01/17  Yes Earlie Server, MD  hyoscyamine (ANASPAZ) 0.125 MG TBDP disintergrating tablet Place 0.125 mg under the tongue every 6 (six) hours as needed (for abdominal cramping due to IBS).   Yes [provider]  ketoconazole (NIZORAL) 2 % shampoo APPLY SHAMPOO TO HAIR AS NEEDED FOR FLAKY/ITCHY SCALP (TYPICALLY EVERY 2-3 DAYS) 03/01/15  Yes [provider]  levothyroxine (SYNTHROID, LEVOTHROID) 100 MCG tablet Take 100 mcg by mouth daily before breakfast.   Yes [provider]  lidocaine (LIDODERM) 5 % Place 1 patch onto the skin every 12 (twelve) hours. Remove & Discard patch within 12 hours or as directed by MD 06/25/17 06/25/18 Yes Earlie Server, MD  lidocaine-prilocaine (EMLA) cream Apply to affected area once 05/22/17  Yes Earlie Server, MD  LORazepam (ATIVAN) 0.5 MG tablet Take 1 tablet (0.5 mg total) by mouth every 8 (eight) hours as needed for anxiety (nausea). 06/01/17  Yes Earlie Server, MD  Methylcellulose, Laxative, (CITRUCEL PO) Take 1 tablet by mouth daily after breakfast.   Yes [provider]  morphine (MS CONTIN) 30 MG 12 hr tablet Take 60 mg by mouth every 12 (twelve) hours.    Yes [provider]  Multiple Vitamin (MULTIVITAMIN WITH MINERALS) TABS tablet Take 1 tablet by mouth daily after breakfast. CENTRUM SILVER   Yes [provider]  ondansetron (ZOFRAN) 8 MG tablet Take 1 tablet (8 mg total) by mouth 2 (two) times daily as  needed for refractory nausea / vomiting. Start on day 3 after carboplatin chemo. 05/22/17  Yes Earlie Server, MD  PEPPERMINT OIL PO Take 1 capsule by mouth 3 (three) times daily as needed (for IBS). IBgard (active ingredient: 90 mg ultrapurified peppermint oil)   Yes [provider]  predniSONE (DELTASONE) 10 MG tablet Take 1 tablet (10 mg total) by mouth daily with breakfast. 07/03/17  Yes Earlie Server, MD  Probiotic Product (PROBIOTIC PO) Take 1 capsule by mouth daily as needed (for digestive health (see instructions)). AFTER BREAKFAST EITHER TAKE A PROBIOTIC OR EAT YOGURT   Yes [provider]  prochlorperazine (COMPAZINE) 10 MG tablet Take 1 tablet (10 mg total) by mouth every 6 (six) hours as needed (Nausea or vomiting). 05/22/17  Yes Earlie Server, MD  promethazine (PHENERGAN) 25 MG tablet Take 1 tablet (25 mg total) by mouth every 6 (six) hours as needed for nausea or vomiting. 07/03/17  Yes Earlie Server, MD  RABEprazole (ACIPHEX) 20 MG tablet Take 20 mg by mouth daily before breakfast.  03/08/15  Yes [provider]  ramipril (ALTACE) 10 MG capsule Take 10 mg by mouth daily after breakfast.   Yes [provider]  senna (SENOKOT) 8.6 MG TABS tablet Take 2 tablets (17.2 mg total) by mouth daily. 05/08/17  Yes Earlie Server, MD  sertraline (ZOLOFT) 100 MG tablet Take 100 mg by mouth daily after breakfast.   Yes [provider]  tamsulosin (FLOMAX) 0.4 MG CAPS capsule Take 0.8 mg by mouth daily after supper.  02/04/14  Yes [provider]  polyethylene glycol powder (GLYCOLAX/MIRALAX) powder Take 17 g by mouth daily after breakfast. Mix with glass of water 05/08/17   Earlie Server, MD     Allergies Patient has no known allergies.   Family History  Problem Relation Age of Onset  . Heart attack Mother   . Heart disease Father   . Prostate cancer Neg Hx   . Bladder Cancer Neg Hx   . Kidney cancer Neg Hx     Social History Social History  Substance Use Topics  .  Smoking status: Former Smoker    Types: Cigarettes    Quit date: 04/07/2002  . Smokeless tobacco: Former Systems developer    Types: Snuff, Chew  . Alcohol use No    Review of Systems  Constitutional:   No fever or chills.  ENT:   No sore throat. No rhinorrhea. Cardiovascular:   No chest pain or syncope. Respiratory:   No dyspnea or cough. Gastrointestinal:   positive as above for abdominal pain vomiting and diarrhea  Musculoskeletal:   Negative for focal pain or swelling All other systems reviewed and are negative except as documented above in ROS and HPI.  ____________________________________________   PHYSICAL EXAM:  VITAL SIGNS: ED Triage Vitals  Enc Vitals  Group     BP 07/06/17 1918 (!) 75/50     Pulse Rate 07/06/17 1918 (!) 113     Resp 07/06/17 1918 18     Temp 07/06/17 1941 (!) 97.5 F (36.4 C)     Temp Source 07/06/17 1941 Oral     SpO2 07/06/17 1918 96 %     Weight --      Height --      Head Circumference --      Peak Flow --      Pain Score 07/06/17 1918 9     Pain Loc --      Pain Edu? --      Excl. in [place]? --     Vital signs reviewed, nursing assessments reviewed.   Constitutional:   Alert and oriented. ill appearing, not in distress Eyes:   No scleral icterus.  EOMI. No nystagmus. No conjunctival pallor. PERRL. ENT   Head:   Normocephalic and atraumatic.   Nose:   No congestion/rhinnorhea.    Mouth/Throat:   dry mucous membranes, no pharyngeal erythema. No peritonsillar mass.    Neck:   No meningismus. Full ROM. Hematological/Lymphatic/Immunilogical:   No cervical lymphadenopathy. Cardiovascular:   tachycardia heart rate 120. Symmetric bilateral radial and DP pulses.  No murmurs.  Respiratory:   Normal respiratory effort without tachypnea/retractions. Breath sounds are clear and equal bilaterally. No wheezes/rales/rhonchi. Gastrointestinal:   Soft with diffuse lower abdominal tenderness. Non distended. There is no CVA tenderness.  No rebound,  rigidity, or guarding. Genitourinary:   deferred Musculoskeletal:   Normal range of motion in all extremities. No joint effusions.  No lower extremity tenderness.  No edema. Neurologic:   Normal speech and language.  Motor grossly intact. No gross focal neurologic deficits are appreciated.  Skin:    Skin is warm, dry and intact. No rash noted.  No petechiae, purpura, or bullae.  ____________________________________________    LABS (pertinent positives/negatives) (all labs ordered are listed, but only abnormal results are displayed) Labs Reviewed  LACTIC ACID, PLASMA - Abnormal; Notable for the following:       Result Value   Lactic Acid, Venous 4.3 (*)    All other components within normal limits  COMPREHENSIVE METABOLIC PANEL - Abnormal; Notable for the following:    Chloride 94 (*)    Glucose, Bld 202 (*)    Creatinine, Ser 1.64 (*)    AST 45 (*)    GFR calc non Af Amer 40 (*)    GFR calc Af Amer 46 (*)    Anion gap 16 (*)    All other components within normal limits  TROPONIN I - Abnormal; Notable for the following:    Troponin I 0.11 (*)    All other components within normal limits  CBC WITH DIFFERENTIAL/PLATELET - Abnormal; Notable for the following:    RBC 4.29 (*)    Hemoglobin 12.5 (*)    HCT 36.9 (*)    RDW 16.9 (*)    Platelets 92 (*)    Neutro Abs 7.8 (*)    Lymphs Abs 0.7 (*)    All other components within normal limits  URINALYSIS, COMPLETE (UACMP) WITH MICROSCOPIC - Abnormal; Notable for the following:    Color, Urine YELLOW (*)    APPearance CLEAR (*)    Hgb urine dipstick SMALL (*)    Protein, ur 30 (*)    Leukocytes, UA SMALL (*)    Bacteria, UA RARE (*)    All other components within  normal limits  CULTURE, BLOOD (ROUTINE X 2)  CULTURE, BLOOD (ROUTINE X 2)  URINE CULTURE  LACTIC ACID, PLASMA  LIPASE, BLOOD  PROCALCITONIN  APTT  PROTIME-INR   ____________________________________________   EKG  interpreted by me Sinus tachycardia rate  105, normal axis intervals QRS ST segments and T waves.  ____________________________________________    RADIOLOGY  Ct Abdomen Pelvis W Contrast  Result Date: 07/06/2017 CLINICAL DATA:  Vomiting. 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 EXAM: CT ABDOMEN AND PELVIS WITH CONTRAST TECHNIQUE: Multidetector CT imaging of the abdomen and pelvis was performed using the standard protocol following bolus administration of intravenous contrast. CONTRAST:  44mL ISOVUE-300 IOPAMIDOL (ISOVUE-300) INJECTION 61% COMPARISON:  CT abdomen pelvis 05/03/2017 FINDINGS: Lower chest: No pulmonary nodules or pleural effusion. No visible pericardial effusion. Hepatobiliary: Normal hepatic contours and density. No visible biliary dilatation. Normal gallbladder. Pancreas: Normal contours without ductal dilatation. No peripancreatic fluid collection. Spleen: Normal. Adrenals/Urinary Tract: --Adrenal glands: Normal. --Right kidney/ureter: There is diffuse abnormality of the renal hilum with loss of the normal enhancement pattern of most of the renal parenchyma. This extends to the right-sided renal vessels and encases approximately 270 degrees of the aorta. There is also invasion of the inferior vena cava. Invasion into the adjacent vertebral body anterior wall has worsened. 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 There is periureteral stranding. --Left kidney/ureter: Mild left hydroureteronephrosis without a clear obstructing lesion. The left kidney itself is normal. The left renal vein's inflow into the IVC is likely occluded. It may be draining via lumbar veins. --Urinary bladder: Distended but otherwise unremarkable. Stomach/Bowel: --Stomach/Duodenum: No hiatal hernia or other gastric abnormality. Normal duodenal course and caliber. --Small bowel: No dilatation or inflammation. --Colon: Moderate colonic stool volume. Mild sigmoid diverticulosis.  --Appendix: Not visualized. No right lower quadrant inflammation or free fluid. Vascular/Lymphatic: There is invasion of the inferior vena cava, encasement of the aorta and obliteration of the right renal vessels, as described above. There is atherosclerotic calcification of the non aneurysmal abdominal aorta. No abdominal or pelvic lymphadenopathy. Reproductive: Normal prostate and seminal vesicles. Musculoskeletal. There is invasion of the L2 vertebral body by the above-described mass of the renal hilum. Prominent soft tissue along the ventral aspect of the lumbar spinal canal may indicate dilated venous plexus, as described on the prior MRI. Other: Small amount of fluid in the left inguinal canal. IMPRESSION: 1. Progression of mass at the right renal hilum, which now invades and occludes the right renal artery and vein and the inferior vena cava. The mass also encases approximately 270 degrees of the aorta and invades the anterior aspect of the L2 vertebral body. 2. No focal gastrointestinal abnormality. 3. Aortic atherosclerosis (ICD10-I70.0). Electronically Signed   By: Ulyses Jarred M.D.   On: 07/06/2017 22:24   Dg Chest Port 1 View  Result Date: 07/06/2017 CLINICAL DATA:  Vomiting today. The patient is undergoing chemotherapy for urothelial carcinoma. EXAM: PORTABLE CHEST 1 VIEW COMPARISON:  Single-view of the chest 03/23/2017. CT chest, abdomen and pelvis 05/03/2017. FINDINGS: Port-A-Cath is in place. The patient is status post CABG. Lungs are clear. Heart size is normal. No pneumothorax or pleural effusion. No acute bony abnormality. IMPRESSION: No acute disease. Electronically Signed   By: Inge Rise M.D.   On: 07/06/2017 20:18    ____________________________________________   PROCEDURES Procedures CRITICAL CARE Performed by: Joni Fears, Linnet Bottari   Total critical care time: 35 minutes  Critical care time was  exclusive of separately billable procedures and treating other  patients.  Critical care was necessary to treat or prevent imminent or life-threatening deterioration.  Critical care was time spent personally by me on the following activities: development of treatment plan with patient and/or surrogate as well as nursing, discussions with consultants, evaluation of patient's response to treatment, examination of patient, obtaining history from patient or surrogate, ordering and performing treatments and interventions, ordering and review of laboratory studies, ordering and review of radiographic studies, pulse oximetry and re-evaluation of patient's condition.  ____________________________________________   DIFFERENTIAL DIAGNOSIS  appendicitis , diverticulitis, bowel perforation, obstruction, renal abscess, pyelonephritis, dehydration, sepsis  CLINICAL IMPRESSION / ASSESSMENT AND PLAN / ED COURSE  Pertinent labs & imaging results that were available during my care of the patient were reviewed by me and considered in my medical decision making (see chart for details).   patient presents with abdominal pain in the setting of cancer patient on chemotherapy and radiation. Concern for immunosuppression and sepsis, so sepsis protocol was initiated with empiric vancomycin and Zosyn. Patient given IV fluid boluses. He was found to be orthostatic with hypotension initially. This was fluid responsive improving his heart rate and blood pressure. He was not febrile. Labs unremarkable, CT scan does not show any acute findings but does show complications related to progression of his cancer. Due to the symptoms and dehydration with elevated lactate, case discussed with the hospitalist for further management.      ____________________________________________   FINAL CLINICAL IMPRESSION(S) / ED DIAGNOSES    Final diagnoses:  Dehydration  Malignant neoplasm of right kidney (HCC)  Nausea vomiting and diarrhea  Lower abdominal pain      New Prescriptions    No medications on file     Portions of this note were generated with dragon dictation software. Dictation errors may occur despite best attempts at proofreading.    Carrie Mew, MD 07/06/17 2352

## 2017-07-06 NOTE — ED Triage Notes (Signed)
Patient arrives from home with c/o vomiting.  He just finished chemo and is taking radiation.  He takes zofran, phenergan and ativan for nausea.  BP 75/50 during triage

## 2017-07-06 NOTE — ED Notes (Addendum)
Pt is difficult stick, neither PIV able to draw back.  Lab notified at this time to draw repeat Lactic Acid.

## 2017-07-06 NOTE — H&P (Addendum)
History and Physical   [facility] - [place] @ [facility] Admission History and Physical [employer], D.O.    Patient Name: Joseph Ritter MR#: [id number] Date of Birth: [birth year] Date of Admission: 07/06/2017  Referring MD/NP/PA: Dr. Joni Fears Primary Care Physician: [name], MD  Chief Complaint:  Chief Complaint  Patient presents with  . Emesis    HPI: Franklin Baumbach is a 76 y.o. 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  Patient has been undergoing chemo, finished radiation for a right sided transitional cell tumor that was encasing vasculature and was therefore not resected.  He presents tonight complaining of severe bilateral lower quadrant abdominal pain as well as midback pain.  He has had decreased PO intake, worsening weakness and gait instability.  Patient denies fevers/chills, chest pain, shortness of breath, leg pain dysuria/frequency, changes in mental status.   .    EMS/ED Course: Patient received Zosyn, vancomycin, normal saline, Dilaudid and Zofran. Medical admission has been requested for further management of acute kidney injury, sepsis secondary to urinary tract infection, intractable nausea and vomiting and elevated troponin.  Review of Systems:  CONSTITUTIONAL: Positive fatigue and weakness. No fever/chills,weight gain/loss, headache. EYES: No blurry or double vision. ENT: No tinnitus, postnasal drip, redness or soreness of the oropharynx. RESPIRATORY: No cough, dyspnea, wheeze.  No hemoptysis.  CARDIOVASCULAR: No chest pain, palpitations, syncope, orthopnea. No lower extremity edema.  GASTROINTESTINAL: Positive nausea, vomiting, abdominal pain, diarrhea, negative constipation.  No hematemesis, melena or hematochezia. GENITOURINARY: No dysuria, frequency, hematuria. Positive  mid back pain.  ENDOCRINE: No polyuria or nocturia. No heat or cold intolerance. HEMATOLOGY: No anemia, bruising, bleeding. INTEGUMENTARY: No rashes, ulcers, lesions. MUSCULOSKELETAL: No arthritis, gout, dyspnea.  NEUROLOGIC: No numbness, tingling, ataxia, seizure-type activity, weakness. PSYCHIATRIC: No anxiety, depression, insomnia.   Past Medical History:  Diagnosis Date  . Acid indigestion 03/09/2014  . Adult hypothyroidism 11/18/2012  . Anxiety   . Arteriosclerosis of coronary artery 11/18/2012   Overview:  PATENT LIMA TO LAD, PATENT SVG TO PDA AND OCCULUDED SVG TO OM2 BY CATHERIZATION 02/09/2009   . 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 Borderline diabetes 05/25/2014  . BP (high blood pressure) 11/18/2012  . BPH (benign prostatic hypertrophy)   . CAFL (chronic airflow limitation) ([place]) 05/03/2015  . Cancer ([place])    renal cancer with mets  . CCF (congestive cardiac failure) ([place]) 01/03/2014   Overview:  HX OF   . Clinical depression 05/03/2015  . COPD (chronic obstructive pulmonary disease) ([place])   . DDD (degenerative disc disease), lumbar 06/29/2014  . 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 Gastric ulcer 03/09/2014  . 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 History of urinary self-catheterization 2017  . HLD (hyperlipidemia) 01/03/2014  . Incomplete bladder emptying 11/18/2012  . Leg weakness 11/18/2012  . Lumbar radiculitis   . Neuritis or radiculitis due to rupture of lumbar intervertebral disc 06/29/2014  . Overactive bladder   . PONV (postoperative nausea and vomiting)    years ago with Ether, no problem with Nausea or vomiting with the last few surgerys  . Pre-diabetes   . 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 Teeth problem    pt reports "bad teeth", "  need to be pulled"    Past Surgical History:  Procedure Laterality Date  .  ARTERY BIOPSY Right 12/16/2015   Procedure: BIOPSY TEMPORAL ARTERY;  Surgeon: Algernon Huxley, MD;  Location: [facility];  Service: Vascular;  Laterality: Right;  . CARDIAC CATHETERIZATION    . CATARACT EXTRACTION W/PHACO Left 01/26/2016   Procedure: CATARACT EXTRACTION PHACO AND INTRAOCULAR LENS PLACEMENT (IOC) LEFT EYE;  Surgeon: Leandrew Koyanagi, MD;  Location: [facility];  Service: Ophthalmology;  Laterality: Left;  . CORONARY ARTERY BYPASS GRAFT  04/07/2002   [facility]  . Cysto Bladder Botox Injection  07/02/2014  . CYSTOSCOPY W/ RETROGRADES Right 03/16/2017   Procedure: CYSTOSCOPY WITH RETROGRADE PYELOGRAM;  Surgeon: [name], MD;  Location: [facility];  Service: Urology;  Laterality: Right;  . CYSTOSCOPY WITH STENT PLACEMENT Right 03/16/2017   Procedure: CYSTOSCOPY WITH STENT PLACEMENT;  Surgeon: [name], MD;  Location: [facility];  Service: Urology;  Laterality: Right;  . EYE SURGERY Left    blood clot behind left eye  . HAND SURGERY Bilateral   . HERNIA REPAIR Right    inguinal hernia repair  . LEFT HEART CATH AND CORS/GRAFTS ANGIOGRAPHY N/A 04/25/2017   Procedure: Left Heart Cath and Cors/Grafts Angiography;  Surgeon: Isaias Cowman, MD;  Location: [place] [facility];  Service: Cardiovascular;  Laterality: N/A;  . PORTA CATH INSERTION N/A 05/30/2017   Procedure: Glori Luis Cath Insertion;  Surgeon: Katha Cabal, MD;  Location: [place] [facility];  Service: Cardiovascular;  Laterality: N/A;  . ROBOT ASSITED LAPAROSCOPIC NEPHROURETERECTOMY Right 05/02/2017   Procedure: ROBOT ASSITED LAPAROSCOPIC NEPHROURETERECTOMY ATTEMPTED;  Surgeon: [name], MD;  Location: [facility];  Service: Urology;  Laterality: Right;  . URETEROSCOPY Right 03/16/2017   Procedure: URETEROSCOPY BIOPSY RENAL MASS;  Surgeon: [name], MD;  Location: [facility];  Service: Urology;  Laterality: Right;     reports that he quit smoking about 15 years ago. His smoking use included  Cigarettes. He has quit using smokeless tobacco. His smokeless tobacco use included Snuff and Chew. He reports that he does not drink alcohol or use drugs.  No Known Allergies  Family History  Problem Relation Age of Onset  . Heart attack Mother   . Heart disease Father   . Prostate cancer Neg Hx   . Bladder Cancer Neg Hx   . Kidney cancer Neg Hx     Prior to Admission medications   Medication Sig Start Date End Date Taking? Authorizing Provider  acetaminophen (TYLENOL) 500 MG tablet Take 500 mg by mouth 2 (two) times daily as needed (for pain.).    Yes [provider]  aspirin EC 81 MG tablet Take 81 mg by mouth daily.   Yes [provider]  buPROPion (WELLBUTRIN SR) 100 MG 12 hr tablet TAKE ONE TABLET BY MOUTH EVERY DAY 05/29/17  Yes [provider]  cyclobenzaprine (FLEXERIL) 10 MG tablet Take 10 mg by mouth at bedtime as needed for muscle spasms.   Yes [provider]  ezetimibe-simvastatin (VYTORIN) 10-40 MG tablet Take 1 tablet by mouth at bedtime.   Yes [provider]  HYDROcodone-acetaminophen (NORCO) 7.5-325 MG tablet Take 1 tablet by mouth every 4 (four) hours as needed for moderate pain. Patient taking differently: Take 2 tablets by mouth every 6 (six) hours as needed for moderate pain.  06/01/17  Yes Earlie Server, MD  hyoscyamine (ANASPAZ) 0.125 MG TBDP disintergrating tablet Place 0.125 mg under the tongue every 6 (six)  hours as needed (for abdominal cramping due to IBS).   Yes [provider]  ketoconazole (NIZORAL) 2 % shampoo APPLY SHAMPOO TO HAIR AS NEEDED FOR FLAKY/ITCHY SCALP (TYPICALLY EVERY 2-3 DAYS) 03/01/15  Yes [provider]  levothyroxine (SYNTHROID, LEVOTHROID) 100 MCG tablet Take 100 mcg by mouth daily before breakfast.   Yes [provider]  lidocaine (LIDODERM) 5 % Place 1 patch onto the skin every 12 (twelve) hours. Remove & Discard patch within 12 hours or as directed by MD 06/25/17 06/25/18 Yes Earlie Server, MD  lidocaine-prilocaine (EMLA) cream Apply to affected area once 05/22/17  Yes Earlie Server, MD  LORazepam (ATIVAN) 0.5 MG tablet Take 1 tablet (0.5 mg total) by mouth every 8 (eight) hours as needed for anxiety (nausea). 06/01/17  Yes Earlie Server, MD  Methylcellulose, Laxative, (CITRUCEL PO) Take 1 tablet by mouth daily after breakfast.   Yes [provider]  morphine (MS CONTIN) 30 MG 12 hr tablet Take 60 mg by mouth every 12 (twelve) hours.    Yes [provider]  Multiple Vitamin (MULTIVITAMIN WITH MINERALS) TABS tablet Take 1 tablet by mouth daily after breakfast. CENTRUM SILVER   Yes [provider]  ondansetron (ZOFRAN) 8 MG tablet Take 1 tablet (8 mg total) by mouth 2 (two) times daily as needed for refractory nausea / vomiting. Start on day 3 after carboplatin chemo. 05/22/17  Yes Earlie Server, MD  PEPPERMINT OIL PO Take 1 capsule by mouth 3 (three) times daily as needed (for IBS). IBgard (active ingredient: 90 mg ultrapurified peppermint oil)   Yes [provider]  predniSONE (DELTASONE) 10 MG tablet Take 1 tablet (10 mg total) by mouth daily with breakfast. 07/03/17  Yes Earlie Server, MD  Probiotic Product (PROBIOTIC PO) Take 1 capsule by mouth daily as needed (for digestive health (see instructions)). AFTER BREAKFAST EITHER TAKE A PROBIOTIC OR EAT YOGURT   Yes [provider]  prochlorperazine (COMPAZINE) 10 MG tablet Take 1 tablet (10 mg total) by mouth every 6 (six) hours as needed (Nausea or vomiting). 05/22/17  Yes Earlie Server, MD  promethazine (PHENERGAN) 25 MG tablet Take 1 tablet (25 mg total) by mouth every 6 (six) hours as needed for nausea or vomiting. 07/03/17  Yes Earlie Server, MD  RABEprazole (ACIPHEX) 20 MG tablet Take 20 mg by mouth daily before breakfast.  03/08/15  Yes [provider]  ramipril (ALTACE) 10 MG capsule Take 10 mg by mouth daily after breakfast.   Yes [provider]  senna (SENOKOT) 8.6 MG TABS tablet Take 2 tablets  (17.2 mg total) by mouth daily. 05/08/17  Yes Earlie Server, MD  sertraline (ZOLOFT) 100 MG tablet Take 100 mg by mouth daily after breakfast.   Yes [provider]  tamsulosin (FLOMAX) 0.4 MG CAPS capsule Take 0.8 mg by mouth daily after supper.  02/04/14  Yes [provider]  polyethylene glycol powder (GLYCOLAX/MIRALAX) powder Take 17 g by mouth daily after breakfast. Mix with glass of water 05/08/17   Earlie Server, MD    Physical Exam: Vitals:   07/06/17 2100 07/06/17 2130 07/06/17 2251 07/06/17 2335  BP: (!) 143/58 (!) 151/72 114/62 107/62  Pulse: 83 81 85 82  Resp: (!) 24 (!) 23 14 14   Temp:      TempSrc:      SpO2: 98% 98% 100% 99%    GENERAL: 76 y.o.-year-old Chronically ill-appearing male patient, well-developed, well-nourished lying in the bed in no acute distress.  Pleasant  and cooperative.   HEENT: Head atraumatic, normocephalic. Pupils equal. Mucus membranes dry NECK: Supple, full range of motion. CHEST: Normal breath sounds bilaterally. No wheezing, rales, rhonchi or crackles. No use of accessory muscles of respiration.  No reproducible chest wall tenderness.  CARDIOVASCULAR: S1, S2 normal. No murmurs, rubs, or gallops. Cap refill <2 seconds. Pulses intact distally.  ABDOMEN: Soft, nondistended, bilateral lower quadrant tenderness palpation. No rebound, guarding, rigidity. Normoactive bowel sounds present in all four quadrants.  EXTREMITIES: No pedal edema, cyanosis, or clubbing. No calf tenderness or Homan's sign.  NEUROLOGIC: The patient is alert and oriented x 3. Cranial nerves II through XII are grossly intact with no focal sensorimotor deficit. PSYCHIATRIC:  Normal affect, mood, thought content. SKIN: Warm, dry, and intact without obvious rash, lesion, or ulcer.    Labs on Admission:  CBC:  Recent Labs Lab 07/03/17 0932 07/03/17 1116 07/06/17 1938  WBC 3.1* 3.9 9.0  NEUTROABS 2.1  --  7.8*  HGB 10.6* 11.5* 12.5*  HCT 31.6* 34.0* 36.9*  MCV 85.1 84.9  86.0  PLT 123* 119* 92*   Basic Metabolic Panel:  Recent Labs Lab 07/03/17 0932 07/03/17 1116 07/06/17 1938  NA 134* 135 135  K 3.7 3.7 4.6  CL 96* 97* 94*  CO2 26 25 25   GLUCOSE 136* 148* 202*  BUN 18 20 14   CREATININE 1.14 1.29* 1.64*  CALCIUM 8.9 9.1 9.6   GFR: Estimated Creatinine Clearance: 35.5 mL/min (A) (by C-G formula based on SCr of 1.64 mg/dL (H)). Liver Function Tests:  Recent Labs Lab 07/03/17 0932 07/06/17 1938  AST 31 45*  ALT 44 43  ALKPHOS 110 111  BILITOT 0.4 0.6  PROT 6.7 7.5  ALBUMIN 3.3* 3.7    Recent Labs Lab 07/06/17 1938  LIPASE 15   No results for input(s): AMMONIA in the last 168 hours. Coagulation Profile:  Recent Labs Lab 07/06/17 1938  INR 1.02   Cardiac Enzymes:  Recent Labs Lab 07/03/17 1116 07/03/17 1357 07/06/17 1938  TROPONINI 0.03* <0.03 0.11*   BNP (last 3 results) No results for input(s): PROBNP in the last 8760 hours. HbA1C: No results for input(s): HGBA1C in the last 72 hours. CBG: No results for input(s): GLUCAP in the last 168 hours. Lipid Profile: No results for input(s): CHOL, HDL, LDLCALC, TRIG, CHOLHDL, LDLDIRECT in the last 72 hours. Thyroid Function Tests: No results for input(s): TSH, T4TOTAL, FREET4, T3FREE, THYROIDAB in the last 72 hours. Anemia Panel: No results for input(s): VITAMINB12, FOLATE, FERRITIN, TIBC, IRON, RETICCTPCT in the last 72 hours. Urine analysis:    Component Value Date/Time   COLORURINE YELLOW (A) 07/06/2017 2300   APPEARANCEUR CLEAR (A) 07/06/2017 2300   APPEARANCEUR Cloudy (A) 03/23/2017 1338   LABSPEC 1.012 07/06/2017 2300   PHURINE 6.0 07/06/2017 2300   GLUCOSEU NEGATIVE 07/06/2017 2300   HGBUR SMALL (A) 07/06/2017 2300   BILIRUBINUR NEGATIVE 07/06/2017 2300   BILIRUBINUR Negative 03/23/2017 1338   KETONESUR NEGATIVE 07/06/2017 2300   PROTEINUR 30 (A) 07/06/2017 2300   NITRITE NEGATIVE 07/06/2017 2300   LEUKOCYTESUR SMALL (A) 07/06/2017 2300   LEUKOCYTESUR 1+  (A) 03/23/2017 1338   Sepsis Labs: @LABRCNTIP (procalcitonin:4,lacticidven:4) )No results found for this or any previous visit (from the past 240 hour(s)).   Radiological Exams on Admission: Ct Abdomen Pelvis W Contrast  Result Date: 07/06/2017 CLINICAL DATA:  Vomiting. 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 EXAM: CT ABDOMEN AND PELVIS WITH CONTRAST TECHNIQUE: Multidetector CT  imaging of the abdomen and pelvis was performed using the standard protocol following bolus administration of intravenous contrast. CONTRAST:  65mL ISOVUE-300 IOPAMIDOL (ISOVUE-300) INJECTION 61% COMPARISON:  CT abdomen pelvis 05/03/2017 FINDINGS: Lower chest: No pulmonary nodules or pleural effusion. No visible pericardial effusion. Hepatobiliary: Normal hepatic contours and density. No visible biliary dilatation. Normal gallbladder. Pancreas: Normal contours without ductal dilatation. No peripancreatic fluid collection. Spleen: Normal. Adrenals/Urinary Tract: --Adrenal glands: Normal. --Right kidney/ureter: There is diffuse abnormality of the renal hilum with loss of the normal enhancement pattern of most of the renal parenchyma. This extends to the right-sided renal vessels and encases approximately 270 degrees of the aorta. There is also invasion of the inferior vena cava. Invasion into the adjacent vertebral body anterior wall has worsened. 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 There is periureteral stranding. --Left kidney/ureter: Mild left hydroureteronephrosis without a clear obstructing lesion. The left kidney itself is normal. The left renal vein's inflow into the IVC is likely occluded. It may be draining via lumbar veins. --Urinary bladder: Distended but otherwise unremarkable. Stomach/Bowel: --Stomach/Duodenum: No hiatal hernia or other gastric abnormality. Normal duodenal course and caliber. --Small bowel: No dilatation or inflammation.  --Colon: Moderate colonic stool volume. Mild sigmoid diverticulosis. --Appendix: Not visualized. No right lower quadrant inflammation or free fluid. Vascular/Lymphatic: There is invasion of the inferior vena cava, encasement of the aorta and obliteration of the right renal vessels, as described above. There is atherosclerotic calcification of the non aneurysmal abdominal aorta. No abdominal or pelvic lymphadenopathy. Reproductive: Normal prostate and seminal vesicles. Musculoskeletal. There is invasion of the L2 vertebral body by the above-described mass of the renal hilum. Prominent soft tissue along the ventral aspect of the lumbar spinal canal may indicate dilated venous plexus, as described on the prior MRI. Other: Small amount of fluid in the left inguinal canal. IMPRESSION: 1. Progression of mass at the right renal hilum, which now invades and occludes the right renal artery and vein and the inferior vena cava. The mass also encases approximately 270 degrees of the aorta and invades the anterior aspect of the L2 vertebral body. 2. No focal gastrointestinal abnormality. 3. Aortic atherosclerosis (ICD10-I70.0). Electronically Signed   By: Ulyses Jarred M.D.   On: 07/06/2017 22:24   Dg Chest Port 1 View  Result Date: 07/06/2017 CLINICAL DATA:  Vomiting today. The patient is undergoing chemotherapy for urothelial carcinoma. EXAM: PORTABLE CHEST 1 VIEW COMPARISON:  Single-view of the chest 03/23/2017. CT chest, abdomen and pelvis 05/03/2017. FINDINGS: Port-A-Cath is in place. The patient is status post CABG. Lungs are clear. Heart size is normal. No pneumothorax or pleural effusion. No acute bony abnormality. IMPRESSION: No acute disease. Electronically Signed   By: Inge Rise M.D.   On: 07/06/2017 20:18    EKG: Sinus tachycardia at 105 bpm with normal axis and nonspecific ST-T wave changes.   Assessment/Plan  This is a 76 y.o. male now being admitted with:  #. Sepsis secondary to UTI - Admit  to inpatient with telemetry monitoring - IV antibiotics: Zosyn, Vanco - IV fluid hydration - Follow up  cultures - Repeat CBC in am.   #. Acute kidney injury  - IV fluids and repeat BMP in AM.  - Avoid nephrotoxic medications, hold Altace - Bladder scan and place foley catheter if evidence of urinary retention  #. Intractable N/V - Antiemetics PRN  #. Elevated troponin, likely demand ischemia given above - Continue to monitor on tele - Trend trops  #.  History of renal cell carcinoma with progression of disease to include vascular occlusion of right renal artery - Discussed with vascular surgery, urology recommend consulting with oncology and if no further medical treatments are available, then palliative would be next step. - Palliative care consultation has been requested   #. History of hyperlipidemia - Continue Vytorin  #. History of depression - Continue Wellbutrin and Zoloft  #. History of GERD - Continue AcipHex  #. History of hypertension Hold  Altace  #. History of BPH Continue Flomax  #. History of hypothyroidism Continue Synthroid  Admission status: Inpatient IV Fluids: Normal saline Diet/Nutrition: Nothing by mouth Consults called: Vascular, urology, oncology, palliative DVT Px: Heparin SCDs and early ambulation. Code Status: Full Code  Disposition Plan: To be determined  All the records are reviewed and case discussed with ED provider. Management plans discussed with the patient and/or family who express understanding and agree with plan of care.  Willia Lampert D.O. on 07/06/2017 at 11:48 PM Between 7am to 6pm - Pager - [phone] After 6pm go to [email or web address] - [employer] [facility] [place] [facility] Office [phone] CC: Primary care physician; [name], MD   07/06/2017, 11:48 PM

## 2017-07-06 NOTE — Progress Notes (Signed)
Pharmacy Antibiotic Note  Joseph Ritter is a 76 y.o. male admitted on 07/06/2017 with sepsis.  Pharmacy has been consulted for vanc/zosyn dosing.  Plan: Patient received vanc 1g and zosyn 3.375g IV x 1 in ED  Will f/u w/ vanc 1.25g IV q24h w/ 8 hr stack dose. Will continue zosyn 3.375g IV q8h extended infusion. Will draw a vanc trough 10/16 @ 0400 prior to 4th dose.  Ke 0.0338 T1/2 20 hrs ~ 24 hrs and increase dose to 1.25g Goal trough 15 - 20 mcg/mL     Temp (24hrs), Avg:97.5 F (36.4 C), Min:97.5 F (36.4 C), Max:97.5 F (36.4 C)   Recent Labs Lab 07/03/17 0932 07/03/17 1116 07/06/17 1938  WBC 3.1* 3.9 9.0  CREATININE 1.14 1.29* 1.64*  LATICACIDVEN  --   --  4.3*    Estimated Creatinine Clearance: 35.5 mL/min (A) (by C-G formula based on SCr of 1.64 mg/dL (H)).    No Known Allergies   Thank you for allowing pharmacy to be a part of this patient's care.  Tobie Lords, PharmD, BCPS Clinical Pharmacist 07/06/2017

## 2017-07-06 NOTE — ED Notes (Signed)
Pt denies any needs at this time. Family at bedside.

## 2017-07-06 NOTE — ED Notes (Signed)
Lab to bedside at this time. 

## 2017-07-06 NOTE — ED Notes (Signed)
Patient transported to CT 

## 2017-07-06 NOTE — ED Notes (Signed)
Date and time results received: 07/06/17 2045 (use smartphrase ".now" to insert current time)  Test: troponin Critical Value: 0.11  Name of Provider Notified: Dr. Joni Fears  Orders Received? Or Actions Taken?: Acknowledged

## 2017-07-07 DIAGNOSIS — C651 Malignant neoplasm of right renal pelvis: Secondary | ICD-10-CM

## 2017-07-07 DIAGNOSIS — I823 Embolism and thrombosis of renal vein: Secondary | ICD-10-CM | POA: Diagnosis not present

## 2017-07-07 DIAGNOSIS — N390 Urinary tract infection, site not specified: Secondary | ICD-10-CM | POA: Diagnosis present

## 2017-07-07 DIAGNOSIS — R338 Other retention of urine: Secondary | ICD-10-CM | POA: Diagnosis not present

## 2017-07-07 DIAGNOSIS — Z903 Acquired absence of stomach [part of]: Secondary | ICD-10-CM | POA: Diagnosis not present

## 2017-07-07 DIAGNOSIS — N179 Acute kidney failure, unspecified: Secondary | ICD-10-CM | POA: Diagnosis present

## 2017-07-07 DIAGNOSIS — R1084 Generalized abdominal pain: Secondary | ICD-10-CM

## 2017-07-07 DIAGNOSIS — I509 Heart failure, unspecified: Secondary | ICD-10-CM | POA: Diagnosis present

## 2017-07-07 DIAGNOSIS — R339 Retention of urine, unspecified: Secondary | ICD-10-CM | POA: Diagnosis present

## 2017-07-07 DIAGNOSIS — I248 Other forms of acute ischemic heart disease: Secondary | ICD-10-CM | POA: Diagnosis present

## 2017-07-07 DIAGNOSIS — T451X5A Adverse effect of antineoplastic and immunosuppressive drugs, initial encounter: Secondary | ICD-10-CM | POA: Diagnosis present

## 2017-07-07 DIAGNOSIS — A419 Sepsis, unspecified organism: Secondary | ICD-10-CM

## 2017-07-07 DIAGNOSIS — Z792 Long term (current) use of antibiotics: Secondary | ICD-10-CM | POA: Diagnosis not present

## 2017-07-07 DIAGNOSIS — N319 Neuromuscular dysfunction of bladder, unspecified: Secondary | ICD-10-CM | POA: Diagnosis present

## 2017-07-07 DIAGNOSIS — C7951 Secondary malignant neoplasm of bone: Secondary | ICD-10-CM

## 2017-07-07 DIAGNOSIS — E860 Dehydration: Secondary | ICD-10-CM | POA: Diagnosis present

## 2017-07-07 DIAGNOSIS — C679 Malignant neoplasm of bladder, unspecified: Secondary | ICD-10-CM

## 2017-07-07 DIAGNOSIS — Z905 Acquired absence of kidney: Secondary | ICD-10-CM | POA: Diagnosis not present

## 2017-07-07 DIAGNOSIS — G893 Neoplasm related pain (acute) (chronic): Secondary | ICD-10-CM

## 2017-07-07 DIAGNOSIS — D649 Anemia, unspecified: Secondary | ICD-10-CM

## 2017-07-07 DIAGNOSIS — N280 Ischemia and infarction of kidney: Secondary | ICD-10-CM | POA: Diagnosis present

## 2017-07-07 DIAGNOSIS — B401 Chronic pulmonary blastomycosis: Secondary | ICD-10-CM | POA: Diagnosis present

## 2017-07-07 DIAGNOSIS — D696 Thrombocytopenia, unspecified: Secondary | ICD-10-CM

## 2017-07-07 DIAGNOSIS — E039 Hypothyroidism, unspecified: Secondary | ICD-10-CM | POA: Diagnosis present

## 2017-07-07 DIAGNOSIS — K5900 Constipation, unspecified: Secondary | ICD-10-CM | POA: Diagnosis present

## 2017-07-07 DIAGNOSIS — I130 Hypertensive heart and chronic kidney disease with heart failure and stage 1 through stage 4 chronic kidney disease, or unspecified chronic kidney disease: Secondary | ICD-10-CM | POA: Diagnosis present

## 2017-07-07 DIAGNOSIS — R112 Nausea with vomiting, unspecified: Secondary | ICD-10-CM | POA: Diagnosis not present

## 2017-07-07 DIAGNOSIS — C641 Malignant neoplasm of right kidney, except renal pelvis: Secondary | ICD-10-CM

## 2017-07-07 DIAGNOSIS — E871 Hypo-osmolality and hyponatremia: Secondary | ICD-10-CM

## 2017-07-07 DIAGNOSIS — J449 Chronic obstructive pulmonary disease, unspecified: Secondary | ICD-10-CM | POA: Diagnosis present

## 2017-07-07 DIAGNOSIS — Z515 Encounter for palliative care: Secondary | ICD-10-CM | POA: Diagnosis not present

## 2017-07-07 DIAGNOSIS — Z66 Do not resuscitate: Secondary | ICD-10-CM | POA: Diagnosis present

## 2017-07-07 DIAGNOSIS — N183 Chronic kidney disease, stage 3 (moderate): Secondary | ICD-10-CM

## 2017-07-07 DIAGNOSIS — I472 Ventricular tachycardia: Secondary | ICD-10-CM | POA: Diagnosis not present

## 2017-07-07 DIAGNOSIS — E785 Hyperlipidemia, unspecified: Secondary | ICD-10-CM | POA: Diagnosis present

## 2017-07-07 DIAGNOSIS — I2510 Atherosclerotic heart disease of native coronary artery without angina pectoris: Secondary | ICD-10-CM | POA: Diagnosis present

## 2017-07-07 DIAGNOSIS — G373 Acute transverse myelitis in demyelinating disease of central nervous system: Secondary | ICD-10-CM | POA: Diagnosis present

## 2017-07-07 DIAGNOSIS — R109 Unspecified abdominal pain: Secondary | ICD-10-CM | POA: Diagnosis present

## 2017-07-07 DIAGNOSIS — Z9221 Personal history of antineoplastic chemotherapy: Secondary | ICD-10-CM | POA: Diagnosis not present

## 2017-07-07 DIAGNOSIS — R443 Hallucinations, unspecified: Secondary | ICD-10-CM | POA: Diagnosis not present

## 2017-07-07 DIAGNOSIS — I82220 Acute embolism and thrombosis of inferior vena cava: Secondary | ICD-10-CM | POA: Diagnosis not present

## 2017-07-07 DIAGNOSIS — D61818 Other pancytopenia: Secondary | ICD-10-CM | POA: Diagnosis present

## 2017-07-07 LAB — MAGNESIUM: MAGNESIUM: 1.9 mg/dL (ref 1.7–2.4)

## 2017-07-07 LAB — COMPREHENSIVE METABOLIC PANEL
ALK PHOS: 88 U/L (ref 38–126)
ALT: 34 U/L (ref 17–63)
ANION GAP: 10 (ref 5–15)
AST: 27 U/L (ref 15–41)
Albumin: 3.1 g/dL — ABNORMAL LOW (ref 3.5–5.0)
BILIRUBIN TOTAL: 0.9 mg/dL (ref 0.3–1.2)
BUN: 15 mg/dL (ref 6–20)
CALCIUM: 8.8 mg/dL — AB (ref 8.9–10.3)
CO2: 29 mmol/L (ref 22–32)
CREATININE: 1.27 mg/dL — AB (ref 0.61–1.24)
Chloride: 100 mmol/L — ABNORMAL LOW (ref 101–111)
GFR calc non Af Amer: 54 mL/min — ABNORMAL LOW (ref >60)
Glucose, Bld: 123 mg/dL — ABNORMAL HIGH (ref 65–99)
Potassium: 3.9 mmol/L (ref 3.5–5.1)
Sodium: 139 mmol/L (ref 135–145)
TOTAL PROTEIN: 6.3 g/dL — AB (ref 6.5–8.1)

## 2017-07-07 LAB — TROPONIN I
TROPONIN I: 0.07 ng/mL — AB (ref <0.03)
Troponin I: 0.07 ng/mL (ref <0.03)
Troponin I: 0.05 ng/mL (ref <0.03)

## 2017-07-07 LAB — TSH: TSH: 20.303 u[IU]/mL — AB (ref 0.350–4.500)

## 2017-07-07 LAB — PHOSPHORUS: PHOSPHORUS: 4 mg/dL (ref 2.5–4.6)

## 2017-07-07 MED ORDER — RAMIPRIL 10 MG PO CAPS
10.0000 mg | ORAL_CAPSULE | Freq: Every day | ORAL | Status: DC
  Administered 2017-07-07 – 2017-07-10 (×4): 10 mg via ORAL
  Filled 2017-07-07 (×4): qty 1

## 2017-07-07 MED ORDER — POLYETHYLENE GLYCOL 3350 17 GM/SCOOP PO POWD
17.0000 g | Freq: Every day | ORAL | Status: DC
  Filled 2017-07-07: qty 255

## 2017-07-07 MED ORDER — SENNA 8.6 MG PO TABS
2.0000 | ORAL_TABLET | Freq: Every day | ORAL | Status: DC
  Administered 2017-07-07 – 2017-07-09 (×3): 17.2 mg via ORAL
  Filled 2017-07-07 (×3): qty 2

## 2017-07-07 MED ORDER — SODIUM CHLORIDE 0.9% FLUSH
3.0000 mL | Freq: Two times a day (BID) | INTRAVENOUS | Status: DC
  Administered 2017-07-07 – 2017-07-10 (×8): 3 mL via INTRAVENOUS

## 2017-07-07 MED ORDER — HYDROCODONE-ACETAMINOPHEN 7.5-325 MG PO TABS
2.0000 | ORAL_TABLET | Freq: Four times a day (QID) | ORAL | Status: DC | PRN
  Administered 2017-07-07: 2 via ORAL
  Filled 2017-07-07: qty 2

## 2017-07-07 MED ORDER — LORAZEPAM 0.5 MG PO TABS: 0.5000 mg | ORAL_TABLET | Freq: Three times a day (TID) | ORAL | Status: DC | PRN

## 2017-07-07 MED ORDER — SODIUM CHLORIDE 0.9 % IV SOLN
INTRAVENOUS | Status: DC
  Administered 2017-07-07 – 2017-07-09 (×4): via INTRAVENOUS

## 2017-07-07 MED ORDER — FENTANYL 12 MCG/HR TD PT72: 12.5000 ug | MEDICATED_PATCH | TRANSDERMAL | Status: DC

## 2017-07-07 MED ORDER — PANTOPRAZOLE SODIUM 40 MG PO TBEC
40.0000 mg | DELAYED_RELEASE_TABLET | Freq: Every day | ORAL | Status: DC
  Administered 2017-07-07 – 2017-07-10 (×4): 40 mg via ORAL
  Filled 2017-07-07 (×4): qty 1

## 2017-07-07 MED ORDER — HYOSCYAMINE SULFATE 0.125 MG PO TBDP
0.1250 mg | ORAL_TABLET | Freq: Four times a day (QID) | ORAL | Status: DC | PRN
  Filled 2017-07-07: qty 1

## 2017-07-07 MED ORDER — EZETIMIBE 10 MG PO TABS
10.0000 mg | ORAL_TABLET | Freq: Every day | ORAL | Status: DC
  Administered 2017-07-07 – 2017-07-09 (×3): 10 mg via ORAL
  Filled 2017-07-07 (×4): qty 1

## 2017-07-07 MED ORDER — INFLUENZA VAC SPLIT HIGH-DOSE 0.5 ML IM SUSY
0.5000 mL | PREFILLED_SYRINGE | INTRAMUSCULAR | Status: DC
  Filled 2017-07-07: qty 0.5

## 2017-07-07 MED ORDER — CYCLOBENZAPRINE HCL 10 MG PO TABS: 10.0000 mg | ORAL_TABLET | Freq: Every evening | ORAL | Status: DC | PRN

## 2017-07-07 MED ORDER — SERTRALINE HCL 50 MG PO TABS
100.0000 mg | ORAL_TABLET | Freq: Every day | ORAL | Status: DC
  Administered 2017-07-07 – 2017-07-10 (×4): 100 mg via ORAL
  Filled 2017-07-07 (×4): qty 2

## 2017-07-07 MED ORDER — APIXABAN 2.5 MG PO TABS: 2.5000 mg | ORAL_TABLET | Freq: Two times a day (BID) | ORAL | Status: DC

## 2017-07-07 MED ORDER — PROCHLORPERAZINE MALEATE 10 MG PO TABS
10.0000 mg | ORAL_TABLET | Freq: Four times a day (QID) | ORAL | Status: DC | PRN
  Filled 2017-07-07: qty 1

## 2017-07-07 MED ORDER — LEVOTHYROXINE SODIUM 100 MCG PO TABS
100.0000 ug | ORAL_TABLET | Freq: Every day | ORAL | Status: DC
  Filled 2017-07-07: qty 1

## 2017-07-07 MED ORDER — FLEET ENEMA 7-19 GM/118ML RE ENEM
1.0000 | ENEMA | Freq: Every day | RECTAL | Status: DC | PRN
  Administered 2017-07-07: 1 via RECTAL
  Filled 2017-07-07: qty 1

## 2017-07-07 MED ORDER — ACETAMINOPHEN 500 MG PO TABS: 500.0000 mg | ORAL_TABLET | Freq: Two times a day (BID) | ORAL | Status: DC | PRN

## 2017-07-07 MED ORDER — TAMSULOSIN HCL 0.4 MG PO CAPS
0.8000 mg | ORAL_CAPSULE | Freq: Every day | ORAL | Status: DC
  Administered 2017-07-07 – 2017-07-09 (×3): 0.8 mg via ORAL
  Filled 2017-07-07 (×3): qty 2

## 2017-07-07 MED ORDER — MORPHINE SULFATE (PF) 2 MG/ML IV SOLN
2.0000 mg | INTRAVENOUS | Status: DC | PRN
  Administered 2017-07-07 (×3): 2 mg via INTRAVENOUS
  Filled 2017-07-07 (×3): qty 1

## 2017-07-07 MED ORDER — EZETIMIBE-SIMVASTATIN 10-40 MG PO TABS: 1.0000 | ORAL_TABLET | Freq: Every day | ORAL | Status: DC

## 2017-07-07 MED ORDER — ONDANSETRON HCL 4 MG/2ML IJ SOLN
4.0000 mg | Freq: Four times a day (QID) | INTRAMUSCULAR | Status: DC | PRN
  Administered 2017-07-07 – 2017-07-09 (×7): 4 mg via INTRAVENOUS
  Filled 2017-07-07 (×6): qty 2

## 2017-07-07 MED ORDER — BUPROPION HCL ER (SR) 100 MG PO TB12
100.0000 mg | ORAL_TABLET | Freq: Every day | ORAL | Status: DC
  Administered 2017-07-07 – 2017-07-10 (×4): 100 mg via ORAL
  Filled 2017-07-07 (×6): qty 1

## 2017-07-07 MED ORDER — HEPARIN SODIUM (PORCINE) 5000 UNIT/ML IJ SOLN
5000.0000 [IU] | Freq: Three times a day (TID) | INTRAMUSCULAR | Status: AC
  Administered 2017-07-08: 5000 [IU] via SUBCUTANEOUS
  Filled 2017-07-07: qty 1

## 2017-07-07 MED ORDER — ALBUTEROL SULFATE (2.5 MG/3ML) 0.083% IN NEBU: 2.5000 mg | INHALATION_SOLUTION | Freq: Four times a day (QID) | RESPIRATORY_TRACT | Status: DC | PRN

## 2017-07-07 MED ORDER — LEVOTHYROXINE SODIUM 25 MCG PO TABS
125.0000 ug | ORAL_TABLET | Freq: Every day | ORAL | Status: DC
  Administered 2017-07-08 – 2017-07-10 (×3): 125 ug via ORAL
  Filled 2017-07-07 (×3): qty 1

## 2017-07-07 MED ORDER — IPRATROPIUM BROMIDE 0.02 % IN SOLN: 0.5000 mg | Freq: Four times a day (QID) | RESPIRATORY_TRACT | Status: DC | PRN

## 2017-07-07 MED ORDER — ADULT MULTIVITAMIN W/MINERALS CH
1.0000 | ORAL_TABLET | Freq: Every day | ORAL | Status: DC
  Administered 2017-07-07 – 2017-07-10 (×4): 1 via ORAL
  Filled 2017-07-07 (×4): qty 1

## 2017-07-07 MED ORDER — SIMVASTATIN 20 MG PO TABS
40.0000 mg | ORAL_TABLET | Freq: Every day | ORAL | Status: DC
  Administered 2017-07-07 – 2017-07-09 (×3): 40 mg via ORAL
  Filled 2017-07-07 (×3): qty 2

## 2017-07-07 MED ORDER — MORPHINE SULFATE ER 30 MG PO TBCR
60.0000 mg | EXTENDED_RELEASE_TABLET | Freq: Two times a day (BID) | ORAL | Status: DC
  Administered 2017-07-07 – 2017-07-10 (×6): 60 mg via ORAL
  Filled 2017-07-07 (×6): qty 2

## 2017-07-07 MED ORDER — PROMETHAZINE HCL 25 MG/ML IJ SOLN
12.5000 mg | Freq: Once | INTRAMUSCULAR | Status: AC
  Administered 2017-07-07: 12.5 mg via INTRAVENOUS
  Filled 2017-07-07: qty 1

## 2017-07-07 MED ORDER — PREDNISONE 10 MG PO TABS
10.0000 mg | ORAL_TABLET | Freq: Every day | ORAL | Status: DC
  Administered 2017-07-07 – 2017-07-10 (×4): 10 mg via ORAL
  Filled 2017-07-07 (×4): qty 1

## 2017-07-07 MED ORDER — ONDANSETRON HCL 4 MG/2ML IJ SOLN
INTRAMUSCULAR | Status: AC
  Administered 2017-07-07: 07:00:00 4 mg via INTRAVENOUS
  Filled 2017-07-07: qty 2

## 2017-07-07 MED ORDER — GUAIFENESIN ER 600 MG PO TB12: 600.0000 mg | ORAL_TABLET | Freq: Two times a day (BID) | ORAL | Status: DC | PRN

## 2017-07-07 MED ORDER — FENTANYL 25 MCG/HR TD PT72
25.0000 ug | MEDICATED_PATCH | TRANSDERMAL | Status: DC
  Administered 2017-07-07 – 2017-07-10 (×2): 25 ug via TRANSDERMAL
  Filled 2017-07-07 (×2): qty 1

## 2017-07-07 MED ORDER — ONDANSETRON HCL 4 MG PO TABS: 4.0000 mg | ORAL_TABLET | Freq: Four times a day (QID) | ORAL | Status: DC | PRN

## 2017-07-07 MED ORDER — HEPARIN SODIUM (PORCINE) 5000 UNIT/ML IJ SOLN
5000.0000 [IU] | Freq: Three times a day (TID) | INTRAMUSCULAR | Status: DC
  Administered 2017-07-07 (×3): 5000 [IU] via SUBCUTANEOUS
  Filled 2017-07-07 (×3): qty 1

## 2017-07-07 MED ORDER — ASPIRIN EC 81 MG PO TBEC
81.0000 mg | DELAYED_RELEASE_TABLET | Freq: Every day | ORAL | Status: DC
  Administered 2017-07-07 – 2017-07-10 (×4): 81 mg via ORAL
  Filled 2017-07-07 (×4): qty 1

## 2017-07-07 NOTE — Progress Notes (Signed)
Pt complaining of pain. Primary nurse paged and spoke to Dr. Leslye Peer. Dr. Leslye Peer to place orders for pain relief. Report given to oncoming nurse.

## 2017-07-07 NOTE — Evaluation (Signed)
Physical Therapy Evaluation Patient Details Name: Joseph Ritter MRN: [id number] DOB: April 30, 1941 Today's Date: 07/07/2017   History of Present Illness   76 y.o. 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  Patient has been undergoing chemo, finished radiation for a right sided transitional cell tumor that was encasing vasculature and was therefore not resected.  He presents tonight complaining of severe bilateral lower quadrant abdominal pain as well as midback pain.  Pt here with weakness, gait instability and sepsis/UTI.  Clinical Impression  Pt did well with mobility and transfers and likely would have been able to safely go on a significant bout of ambulation but continued to have BM issues each time we stood up.  He was relatively safe in standing at EOB and transfer to the [facility] with and w/o ADs.  He was able to stand (w/o AD) and use urinal at one point and despite feeling poorly and having abdominal pain did well.  Wife was very involved and helpful and agreed that once he is medically clear that they well be able to safely go home with HHPT.     Follow Up Recommendations Home health PT    Equipment Recommendations       Recommendations for Other Services       Precautions / Restrictions Precautions Precautions: Fall Restrictions Weight Bearing Restrictions: No      Mobility  Bed Mobility Overal bed mobility: Modified Independent             General bed mobility comments: Pt able to get himself up to sitting w/o assist, some abdominal and general pain  Transfers Overall transfer level: Modified independent Equipment used: None             General transfer comment: Pt able to rise and maintain balance w/o AD, he did not have any unsteadiness, but was somewhat  impulsive  Ambulation/Gait Ambulation/Gait assistance: Supervision Ambulation Distance (Feet): 10 Feet Assistive device: Rolling walker (2 wheeled)       General Gait Details: Pt was able to do some limited ambulation in the room but with 3 efforts to try and walk each time we got up he had a BM and needed to sit on the [facility].  Pt was safe and confident with the minimal ambulation we did.  Appeared he could have walked in home distances safely had bowels not been an Paediatric nurse [place] (Stroke Patients Only)       Balance Overall balance assessment: Modified Independent                                           Pertinent Vitals/Pain Pain Assessment: 0-10 Pain Score: 6  Pain Location: abdominal    Home Living Family/patient expects to be discharged to:: Private residence Living Arrangements: Spouse/significant other Available Help at Discharge: Family Type of Home: House Home Access: Stairs to enter Entrance Stairs-Rails: None Entrance Stairs-Number of Steps: 2   Home Equipment: Environmental consultant - 4 wheels;Cane - single point      Prior Function Level of Independence: Independent with assistive device(s)         Comments: Pt rarely out of the  house, but is able to do all he needs     Hand Dominance        Extremity/Trunk Assessment   Upper Extremity Assessment Upper Extremity Assessment: Overall WFL for tasks assessed;Generalized weakness    Lower Extremity Assessment Lower Extremity Assessment: Overall WFL for tasks assessed;Generalized weakness       Communication   Communication: No difficulties  Cognition Arousal/Alertness: Awake/alert Behavior During Therapy: WFL for tasks assessed/performed Overall Cognitive Status: Within Functional Limits for tasks assessed                                        General Comments      Exercises     Assessment/Plan    PT  Assessment Patient needs continued PT services  PT Problem List Decreased strength;Decreased activity tolerance;Decreased range of motion;Decreased balance;Decreased mobility;Decreased knowledge of use of DME;Decreased safety awareness;Pain       PT Treatment Interventions DME instruction;Gait training;Stair training;Functional mobility training;Therapeutic exercise;Therapeutic activities;Balance training;Cognitive remediation;Patient/family education    PT Goals (Current goals can be found in the Care Plan section)  Acute Rehab PT Goals Patient Stated Goal: get feeling better and go home PT Goal Formulation: With patient Time For Goal Achievement: 07/21/17 Potential to Achieve Goals: Good    Frequency Min 2X/week   Barriers to discharge        Co-evaluation               AM-PAC PT "6 Clicks" Daily Activity  Outcome Measure Difficulty turning over in bed (including adjusting bedclothes, sheets and blankets)?: None Difficulty moving from lying on back to sitting on the side of the bed? : A Little Difficulty sitting down on and standing up from a chair with arms (e.g., wheelchair, bedside commode, etc,.)?: A Little Help needed moving to and from a bed to chair (including a wheelchair)?: A Little Help needed walking in hospital room?: A Little Help needed climbing 3-5 steps with a railing? : A Lot 6 Click Score: 18    End of Session Equipment Utilized During Treatment: Gait belt Activity Tolerance: Patient tolerated treatment well (limited by needing to have multiple BMs) Patient left: in chair;with call bell/phone within reach;with nursing/sitter in room;with family/visitor present Nurse Communication: Mobility status PT Visit Diagnosis: Muscle weakness (generalized) (M62.81);Difficulty in walking, not elsewhere classified (R26.2)    Time: 0623-7628 PT Time Calculation (min) (ACUTE ONLY): 21 min   Charges:   PT Evaluation $PT Eval Low Complexity: 1 Low     PT G  Codes:   PT G-Codes **NOT FOR INPATIENT CLASS** Functional Assessment Tool Used: AM-PAC 6 Clicks Basic Mobility Functional Limitation: Mobility: Walking and moving around Mobility: Walking and Moving Around Current Status (B1517): At least 40 percent but less than 60 percent impaired, limited or restricted Mobility: Walking and Moving Around Goal Status [phone]): At least 1 percent but less than 20 percent impaired, limited or restricted    Kreg Shropshire, DPT 07/07/2017, 1:49 PM

## 2017-07-07 NOTE — Consult Note (Addendum)
Hematology/Oncology Consult note [facility] [phone] [phone]  Patient Care Team: [name], MD as PCP - General (Physician Assistant)   Name of the patient: Joseph Ritter  [id number]  30-Jun-1941   Date of visit: 07/07/17 REASON FOR COSULTATION:  Evaluation of cancer progression.  History of presenting illness-  Pertinent ONCOLOGY HISTORY Joseph Ritter 76 y.o. male who developed gross hematuria at the end of 2017. CT 08/2016 showed endophytic hypodense mass. Repeat image 01/2017 showed enlarged  endophytic hypodense mass right kidney. 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 Right robotic nephroureterectomy was attempted but aborted due to unresectable newly metastatic disease diagnosed intraoperatively. Postop he underwent a CT scan of the abdomen and pelvis which showed extension of an infiltrative mass from the right kidney along the right renal vein toward the IVC with encasement of the IVC.  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 He self catheterize a few time a day.  He can walk with walker for very short distance, mostly limited by back pain and fatigue./weakness. #Pathology: 05/02/2017 SPECIMEN SUBMITTED: A. Hilar tissue, right kidney B. Hilar tissue, right kidney DIAGNOSIS:  A and B. SOFT TISSUE, RIGHT RENAL HILUM  PD-L1 - Urothelial (TECENTRIQ) Immunohistochemistry Analysis  Result: 0% Interpretation: No expression  # Current Treatment: 1 S/P Palliative RT to spine 2 Chemotherapy:     06/01/2017 Cycle 1 Gemcitabine and Carboplain.  Day 8 Gemcitabine was delayed due to intractable nausea and vomiting.     Chemotherapy related thrombocytopenia, no active bleeding. 50% dose of Gemcitabine was given on day 11.     06/25/2017 Cycle 2 Gemcitabine and Carboplain.    07/03/2017 Cycle 2 Day 8 gemcitabine held due to symptomatic bradycardia.    Patient was seen by me this Monday and chemotherapy was held due to symptomatic bradycardia.  He presented to ER due to abdominal pain, nausea and vomiting. He also feels that his back pain is worse. He had decreased PO intake, worsened weakness. No fever or chills, chest pain. He was found to have acute kidney injury, UTI sepsis currently admitted for IV antibitocis and symptom palliation.  Patient reports that since admission, he feels better. CT abdomen Pelvis showed progression of disease despite being on active chemotherapy treatment.    Review of systems- Review of Systems  Constitutional: Positive for malaise/fatigue. Negative for chills and fever.  HENT: Negative.   Eyes: Negative.   Respiratory: Negative.   Cardiovascular: Negative.   Gastrointestinal: Positive for abdominal pain, nausea and vomiting.  Genitourinary: Positive for flank pain.  Skin: Negative.   Neurological: Positive for tingling and weakness. Negative for dizziness and headaches.  Endo/Heme/Allergies: Negative.   Psychiatric/Behavioral: The patient is nervous/anxious.     No Known Allergies  Patient Active Problem List   Diagnosis Date Noted  . Sepsis secondary to UTI ([place]) 07/07/2017  . Goals of care, counseling/discussion 05/25/2017  . 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 CKD (chronic kidney disease) stage 3, GFR 30-59 ml/min (HCC) 05/08/2017  . Transitional cell carcinoma of kidney, right ([place]) 05/02/2017  . Right renal mass 02/27/2017  . Gross hematuria 08/15/2016  . Dysuria 08/15/2016  . Acute cystitis with hematuria 08/15/2016  . CAFL (chronic airflow limitation) ([place]) 05/03/2015  . Clinical depression 05/03/2015  . 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 DDD (degenerative disc disease), lumbar 06/29/2014  . Neuritis or radiculitis due to rupture of lumbar intervertebral disc  06/29/2014  . 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 Borderline diabetes 05/25/2014  .  Blood glucose elevated 05/25/2014  . Acid indigestion 03/09/2014  . Gastric ulcer 03/09/2014  . CCF (congestive cardiac failure) ([place]) 01/03/2014  . HLD (hyperlipidemia) 01/03/2014  . 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 Subacute transverse myelitis ([place]) 11/21/2012  . 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 Arteriosclerosis of coronary artery 11/18/2012  . BP (high blood pressure) 11/18/2012  . Adult hypothyroidism 11/18/2012  . Incomplete bladder emptying 11/18/2012  . Leg weakness 11/18/2012  . H/O coronary artery bypass surgery 04/07/2002     Past Medical History:  Diagnosis Date  . Acid indigestion 03/09/2014  . Adult hypothyroidism 11/18/2012  . Anxiety   . Arteriosclerosis of coronary artery 11/18/2012   Overview:  PATENT LIMA TO LAD, PATENT SVG TO PDA AND OCCULUDED SVG TO OM2 BY CATHERIZATION 02/09/2009   . 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 Borderline diabetes 05/25/2014  . BP (high blood pressure) 11/18/2012  . BPH (benign prostatic hypertrophy)   . CAFL (chronic airflow limitation) ([place]) 05/03/2015  . Cancer ([place])    renal cancer with mets  . CCF (congestive cardiac failure) ([place]) 01/03/2014   Overview:  HX OF   . Clinical depression 05/03/2015  . COPD (chronic obstructive pulmonary disease) ([place])   . DDD (degenerative disc disease), lumbar 06/29/2014  . 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 Gastric ulcer 03/09/2014  . 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 History of urinary self-catheterization 2017  . HLD (hyperlipidemia) 01/03/2014  . Incomplete bladder emptying 11/18/2012  . Leg weakness 11/18/2012  . Lumbar radiculitis   . Neuritis or radiculitis due to rupture of lumbar intervertebral disc 06/29/2014  . Overactive bladder   . PONV (postoperative nausea and vomiting)    years ago with Ether, no problem with Nausea or vomiting  with the last few surgerys  . Pre-diabetes   . Subacute transverse myelitis ([place]) 11/21/2012  . Teeth problem    pt reports "bad teeth", "need to be pulled"     Past Surgical History:  Procedure Laterality Date  . ARTERY BIOPSY Right 12/16/2015   Procedure: BIOPSY TEMPORAL ARTERY;  Surgeon: Algernon Huxley, MD;  Location: [facility];  Service: Vascular;  Laterality: Right;  . CARDIAC CATHETERIZATION    . CATARACT EXTRACTION W/PHACO Left 01/26/2016   Procedure: CATARACT EXTRACTION PHACO AND INTRAOCULAR LENS PLACEMENT (IOC) LEFT EYE;  Surgeon: Leandrew Koyanagi, MD;  Location: [place];  Service: Ophthalmology;  Laterality: Left;  . CORONARY ARTERY BYPASS GRAFT  04/07/2002   [facility]  . Cysto Bladder Botox Injection  07/02/2014  . CYSTOSCOPY W/ RETROGRADES Right 03/16/2017   Procedure: CYSTOSCOPY WITH RETROGRADE PYELOGRAM;  Surgeon: [name], MD;  Location: [facility];  Service: Urology;  Laterality: Right;  . CYSTOSCOPY WITH STENT PLACEMENT Right 03/16/2017   Procedure: CYSTOSCOPY WITH STENT PLACEMENT;  Surgeon: [name], MD;  Location: [facility];  Service: Urology;  Laterality: Right;  . EYE SURGERY Left    blood clot behind left eye  . HAND SURGERY Bilateral   . HERNIA REPAIR Right    inguinal hernia repair  . LEFT HEART CATH AND CORS/GRAFTS ANGIOGRAPHY N/A 04/25/2017   Procedure: Left Heart Cath and Cors/Grafts Angiography;  Surgeon: Isaias Cowman, MD;  Location: [place] [facility];  Service: Cardiovascular;  Laterality: N/A;  . PORTA CATH INSERTION N/A 05/30/2017   Procedure: Glori Luis Cath Insertion;  Surgeon: Katha Cabal, MD;  Location: [place] [facility];  Service: Cardiovascular;  Laterality: N/A;  . ROBOT ASSITED LAPAROSCOPIC NEPHROURETERECTOMY Right 05/02/2017   Procedure: ROBOT ASSITED LAPAROSCOPIC NEPHROURETERECTOMY ATTEMPTED;  Surgeon: [name], MD;  Location: [facility];  Service: Urology;  Laterality: Right;  . URETEROSCOPY Right 03/16/2017    Procedure: URETEROSCOPY BIOPSY RENAL MASS;  Surgeon: [name], MD;  Location: [facility];  Service: Urology;  Laterality: Right;    Social History   Social History  . Marital status: Married    Spouse name: N/A  . Number of children: N/A  . Years of education: N/A   Occupational History  . Not on file.   Social History Main Topics  . Smoking status: Former Smoker    Types: Cigarettes    Quit date: 04/07/2002  . Smokeless tobacco: Former Systems developer    Types: Snuff, Chew  . Alcohol use No  . Drug use: No  . Sexual activity: Not on file   Other Topics Concern  . Not on file   Social History Narrative  . No narrative on file     Family History  Problem Relation Age of Onset  . Heart attack Mother   . Heart disease Father   . Prostate cancer Neg Hx   . Bladder Cancer Neg Hx   . Kidney cancer Neg Hx      Current Facility-Administered Medications:  .  0.9 %  sodium chloride infusion, , Intravenous, Continuous, Hugelmeyer, Alexis, DO, Last Rate: 75 mL/hr at 07/07/17 1622 .  acetaminophen (TYLENOL) tablet 500 mg, 500 mg, Oral, BID PRN, Hugelmeyer, Alexis, DO .  albuterol (PROVENTIL) (2.5 MG/3ML) 0.083% nebulizer solution 2.5 mg, 2.5 mg, Nebulization, Q6H PRN, Hugelmeyer, Alexis, DO .  aspirin EC tablet 81 mg, 81 mg, Oral, Daily, Hugelmeyer, Alexis, DO, 81 mg at 07/07/17 1354 .  buPROPion [facility] SR) 12 hr tablet 100 mg, 100 mg, Oral, Daily, Hugelmeyer, Alexis, DO, 100 mg at 07/07/17 1615 .  cyclobenzaprine (FLEXERIL) tablet 10 mg, 10 mg, Oral, QHS PRN, Hugelmeyer, Alexis, DO .  simvastatin (ZOCOR) tablet 40 mg, 40 mg, Oral, q1800, 40 mg at 07/07/17 1613 **AND** ezetimibe (ZETIA) tablet 10 mg, 10 mg, Oral, Daily, Hugelmeyer, Alexis, DO, 10 mg at 07/07/17 1615 .  fentaNYL (DURAGESIC - dosed mcg/hr) patch 25 mcg, 25 mcg, Transdermal, Q72H, Loletha Grayer, MD, 25 mcg at 07/07/17 1353 .  guaiFENesin (MUCINEX) 12 hr tablet 600 mg, 600 mg, Oral, BID PRN, Hugelmeyer,  Alexis, DO .  heparin injection 5,000 Units, 5,000 Units, Subcutaneous, Q8H, Hugelmeyer, Alexis, DO, 5,000 Units at 07/07/17 1614 .  HYDROcodone-acetaminophen (NORCO) 7.5-325 MG per tablet 2 tablet, 2 tablet, Oral, Q6H PRN, Hugelmeyer, Alexis, DO, 2 tablet at 07/07/17 1613 .  hyoscyamine (ANASPAZ) disintergrating tablet 0.125 mg, 0.125 mg, Sublingual, Q6H PRN, Hugelmeyer, Alexis, DO .  [START ON 07/08/2017] Influenza vac split quadrivalent PF (FLUZONE HIGH-DOSE) injection 0.5 mL, 0.5 mL, Intramuscular, Tomorrow-1000, Hugelmeyer, Alexis, DO .  ipratropium (ATROVENT) nebulizer solution 0.5 mg, 0.5 mg, Nebulization, Q6H PRN, Hugelmeyer, Alexis, DO .  [START ON 07/08/2017] levothyroxine (SYNTHROID, LEVOTHROID) tablet 125 mcg, 125 mcg, Oral, QAC breakfast, Wieting, Richard, MD .  LORazepam (ATIVAN) tablet 0.5 mg, 0.5 mg, Oral, Q8H PRN, Hugelmeyer, Alexis, DO .  morphine (MS CONTIN) 12 hr tablet 60 mg, 60 mg, Oral, Q12H, Hugelmeyer, Alexis, DO .  morphine 2 MG/ML injection 2 mg,  2 mg, Intravenous, Q3H PRN, Loletha Grayer, MD, 2 mg at 07/07/17 1401 .  multivitamin with minerals tablet 1 tablet, 1 tablet, Oral, QPC breakfast, Hugelmeyer, Alexis, DO, 1 tablet at 07/07/17 1354 .  ondansetron (ZOFRAN) tablet 4 mg, 4 mg, Oral, Q6H PRN **OR** ondansetron (ZOFRAN) injection 4 mg, 4 mg, Intravenous, Q6H PRN, Hugelmeyer, Alexis, DO, 4 mg at 07/07/17 1353 .  pantoprazole (PROTONIX) EC tablet 40 mg, 40 mg, Oral, Daily, Hugelmeyer, Alexis, DO, 40 mg at 07/07/17 1354 .  piperacillin-tazobactam (ZOSYN) IVPB 3.375 g, 3.375 g, Intravenous, Q8H, Carrie Mew, MD, Stopped at 07/07/17 1421 .  polyethylene glycol powder (GLYCOLAX/MIRALAX) container 17 g, 17 g, Oral, QPC breakfast, Hugelmeyer, Alexis, DO .  predniSONE (DELTASONE) tablet 10 mg, 10 mg, Oral, Q breakfast, Hugelmeyer, Alexis, DO, 10 mg at 07/07/17 1354 .  prochlorperazine (COMPAZINE) tablet 10 mg, 10 mg, Oral, Q6H PRN, Hugelmeyer, Alexis, DO .  ramipril  (ALTACE) capsule 10 mg, 10 mg, Oral, QPC breakfast, Hugelmeyer, Alexis, DO, 10 mg at 07/07/17 1353 .  senna (SENOKOT) tablet 17.2 mg, 2 tablet, Oral, Daily, Hugelmeyer, Alexis, DO, 17.2 mg at 07/07/17 1354 .  sertraline (ZOLOFT) tablet 100 mg, 100 mg, Oral, QPC breakfast, Hugelmeyer, Alexis, DO, 100 mg at 07/07/17 1354 .  sodium chloride flush (NS) 0.9 % injection 3 mL, 3 mL, Intravenous, Q12H, Hugelmeyer, Alexis, DO, 3 mL at 07/07/17 0819 .  sodium phosphate (FLEET) 7-19 GM/118ML enema 1 enema, 1 enema, Rectal, Daily PRN, Loletha Grayer, MD, 1 enema at 07/07/17 0941 .  tamsulosin (FLOMAX) capsule 0.8 mg, 0.8 mg, Oral, QPC supper, Hugelmeyer, Alexis, DO, 0.8 mg at 07/07/17 1613  Facility-Administered Medications Ordered in Other Encounters:  .  heparin lock flush 100 unit/mL, 500 Units, Intravenous, Once, Earlie Server, MD .  sodium chloride flush (NS) 0.9 % injection 10 mL, 10 mL, Intravenous, PRN, Earlie Server, MD, 10 mL at 07/03/17 0930   Physical exam:  Vitals:   07/07/17 0100 07/07/17 0153 07/07/17 0200 07/07/17 0528  BP: (!) 132/103 (!) 91/57  (!) 150/54  Pulse: 90 (!) 110  86  Resp:  18  20  Temp:  98.2 F (36.8 C)  98.4 F (36.9 C)  TempSrc:    Oral  SpO2: 98% 99%  98%  Weight:   145 lb (65.8 kg)   Height:   5' 7"  (1.702 m)     Physical Exam GENERAL:not in distress,  Sitting in wheelchair. SKIN:  No rashes or significant lesions  HEAD: Normocephalic, No masses, lesions, tenderness or abnormalities  EYES: Conjunctiva are pale,  non icteric ENT: External ears normal ,lips , buccal mucosa, and tongue normal and mucous membranes are moist  LYMPH: No palpable cervical and axillary lymphadenopathy  LUNGS: Clear to auscultation, no crackles or wheezes HEART: Regular rate & rhythm, no murmurs, no gallops, S1 normal and S2 normal  ABDOMEN: Abdomen is distended, with normal bowel sounds MUSCULOSKELETAL: lumbar spine tenderness.  EXTREMITIES: No edema, no skin discoloration or  tenderness NEURO: Alert & oriented, lower extremity strength 3/5 bilaterllay. Normal upper extremity strength.     CMP Latest Ref Rng & Units 07/07/2017  Glucose 65 - 99 mg/dL 123(H)  BUN 6 - 20 mg/dL 15  Creatinine 0.61 - 1.24 mg/dL 1.27(H)  Sodium 135 - 145 mmol/L 139  Potassium 3.5 - 5.1 mmol/L 3.9  Chloride 101 - 111 mmol/L 100(L)  CO2 22 - 32 mmol/L 29  Calcium 8.9 - 10.3 mg/dL 8.8(L)  Total Protein 6.5 - 8.1 g/dL 6.3(L)  Total Bilirubin 0.3 - 1.2 mg/dL 0.9  Alkaline Phos 38 - 126 U/L 88  AST 15 - 41 U/L 27  ALT 17 - 63 U/L 34   CBC Latest Ref Rng & Units 07/06/2017  WBC 3.8 - 10.6 K/uL 9.0  Hemoglobin 13.0 - 18.0 g/dL 12.5(L)  Hematocrit 40.0 - 52.0 % 36.9(L)  Platelets 150 - 440 K/uL 92(L)    @IMAGES @  Mr Jeri Cos Wo Contrast  Result Date: 06/16/2017 CLINICAL DATA:  Intractable vomiting and nausea.  Bladder cancer EXAM: MRI HEAD WITHOUT AND WITH CONTRAST TECHNIQUE: Multiplanar, multiecho pulse sequences of the brain and surrounding structures were obtained without and with intravenous contrast. CONTRAST:  7m MULTIHANCE GADOBENATE DIMEGLUMINE 529 MG/ML IV SOLN COMPARISON:  None. FINDINGS: Brain: Mild generalized atrophy. Negative for acute infarct. Mild chronic microvascular ischemic change in the white matter. Brainstem and cerebellum normal. Negative for hemorrhage or mass. Normal enhancement postcontrast infusion. Vascular: Normal arterial flow voids Skull and upper cervical spine: Negative Sinuses/Orbits: Negative Other: None IMPRESSION: Atrophy and mild chronic microvascular ischemia. No acute abnormality. Negative for metastatic disease. Electronically Signed   By: CFranchot GalloM.D.   On: 06/16/2017 16:00   Ct Abdomen Pelvis W Contrast  Result Date: 07/06/2017 CLINICAL DATA:  Vomiting. 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 EXAM: CT ABDOMEN AND PELVIS WITH CONTRAST TECHNIQUE: Multidetector CT imaging  of the abdomen and pelvis was performed using the standard protocol following bolus administration of intravenous contrast. CONTRAST:  744mISOVUE-300 IOPAMIDOL (ISOVUE-300) INJECTION 61% COMPARISON:  CT abdomen pelvis 05/03/2017 FINDINGS: Lower chest: No pulmonary nodules or pleural effusion. No visible pericardial effusion. Hepatobiliary: Normal hepatic contours and density. No visible biliary dilatation. Normal gallbladder. Pancreas: Normal contours without ductal dilatation. No peripancreatic fluid collection. Spleen: Normal. Adrenals/Urinary Tract: --Adrenal glands: Normal. --Right kidney/ureter: There is diffuse abnormality of the renal hilum with loss of the normal enhancement pattern of most of the renal parenchyma. This extends to the right-sided renal vessels and encases approximately 270 degrees of the aorta. There is also invasion of the inferior vena cava. Invasion into the adjacent vertebral body anterior wall has worsened. 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 There is periureteral stranding. --Left kidney/ureter: Mild left hydroureteronephrosis without a clear obstructing lesion. The left kidney itself is normal. The left renal vein's inflow into the IVC is likely occluded. It may be draining via lumbar veins. --Urinary bladder: Distended but otherwise unremarkable. Stomach/Bowel: --Stomach/Duodenum: No hiatal hernia or other gastric abnormality. Normal duodenal course and caliber. --Small bowel: No dilatation or inflammation. --Colon: Moderate colonic stool volume. Mild sigmoid diverticulosis. --Appendix: Not visualized. No right lower quadrant inflammation or free fluid. Vascular/Lymphatic: There is invasion of the inferior vena cava, encasement of the aorta and obliteration of the right renal vessels, as described above. There is atherosclerotic calcification of the non aneurysmal abdominal aorta. No abdominal or pelvic lymphadenopathy. Reproductive: Normal prostate and seminal  vesicles. Musculoskeletal. There is invasion of the L2 vertebral body by the above-described mass of the renal hilum. Prominent soft tissue along the ventral aspect of the lumbar spinal canal may indicate dilated venous plexus, as described on the prior MRI. Other: Small amount of fluid in the left inguinal canal. IMPRESSION: 1. Progression of mass at the right renal hilum, which now invades and occludes the right renal artery and vein and the inferior vena cava. The mass also encases approximately 270 degrees of the aorta and invades the  anterior aspect of the L2 vertebral body. 2. No focal gastrointestinal abnormality. 3. Aortic atherosclerosis (ICD10-I70.0). Electronically Signed   By: Ulyses Jarred M.D.   On: 07/06/2017 22:24   Dg Chest Port 1 View  Result Date: 07/06/2017 CLINICAL DATA:  Vomiting today. The patient is undergoing chemotherapy for urothelial carcinoma. EXAM: PORTABLE CHEST 1 VIEW COMPARISON:  Single-view of the chest 03/23/2017. CT chest, abdomen and pelvis 05/03/2017. FINDINGS: Port-A-Cath is in place. The patient is status post CABG. Lungs are clear. Heart size is normal. No pneumothorax or pleural effusion. No acute bony abnormality. IMPRESSION: No acute disease. Electronically Signed   By: Inge Rise M.D.   On: 07/06/2017 20:18    Assessment and plan- Patient is a 76 y.o. male who is currently being treated for locally advance transitional cell carcinoma of right kidney with Gemcitabine and Carboplatin presented with abdominal pain, nausea/vomiting, urinary retention due to inability to self catheterize.  1 Locally advanced right renal urothelial carcinoma, inoperable, currently on systemic chemotherapy, now with CT evidence of disease progression, and functional declining. Tumor is progressively encasing renal vein and aorta and Inferior vena cava,  Discussed with patient and his wife regarding the disease progression and the next line treatment is immunotherapy  (Pembrolizumab). Overall patient's declined functional status and rapid progression indicate a poor prognosis.  Although Immunotherapy is approved in thi setting regardless of PD-L1 expression, patient who has low PD-L1 expression (His PD-L1 is 0%) may not respond well comparing to patient population with PD-L1 expression >10%. Patient asked about side effects profile and he wants to know if he will feel the same way as he goes through chemotherapy treatment. Discussed with patient that immunotherapy has a different spectrum of side effects and generally is more tolerable. Patient would like to think it over and let me know. For now he will concentrate on fighting infection and get pain better controlled. I recommend to have Palliative care medicine involved and initiate hospice conversation and patient is in agreement. Palliative care Medicine consult ordered for symptom management and initiation of hospice.   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 Vascular surgery has evaluated patient and there is no plan for surgical intervention. Recommend starting anticoagulation with Eliquis 2.47m BID. When his chemotherapy induced thrombocytopenia improves, I will further titrate it to Eliquis 54mBID outpatient. case manager to check insurance coverage.   Total face to face encounter time for this patient visit was 70 min. >50% of the time was  spent in counseling and coordination of care.  Thank you for this kind referral and the opportunity to participate in the care of this patient. I will continue to follow along his inpatient course.  Dr. ZhEarlie ServerMD, PhD CHMount Carmel Behavior[facility] LLCt AlSt [facility]spt[facility]ager- [phone]/13/2018

## 2017-07-07 NOTE — Progress Notes (Signed)
Pt complained of nausea with some emesis. Primary nurse paged and spoke to Dr. Estanislado Pandy. Orders received for Phenergan 12.5 mg IV once. Primary nurse to continue to monitor.

## 2017-07-07 NOTE — ED Notes (Signed)
Per Dr. Ara Kussmaul pt is to stay in ED until she hears from vascular MD.

## 2017-07-07 NOTE — Progress Notes (Signed)
Patient ID: Joseph Ritter, male   DOB: [birth year], 76 y.o.   MRN: [id number]  ACP note.  Patient, wife and family member at the bedside.  Diagnosis:  Right renal cancer with occlusion of the right renal artery vein, nausea vomiting. Patient with severe pain.  Case discussed with patient and family and he wishes to be a DO NOT RESSCITATE at this time. I think this is a wise decision considering that his overall prognosis is poor.  Pain control with fentanyl patch, MS Contin, oral oxycodone. IV morphine while here. Patient also had severe constipation relieved with enema. Patient also had urinary retention retaining with Foley catheter placement.  Patient would be a good candidate for hospice. Family will have to discuss further treatment options with oncology before making this decision.  Time spent on ACP discussion 30 minutes  Dr. Loletha Grayer

## 2017-07-07 NOTE — ED Notes (Signed)
Dr. Hugelmeyer at bedside. 

## 2017-07-07 NOTE — Progress Notes (Signed)
[facility]  Patient name: Joseph Ritter MRN: [id number] DOB: 1941-03-03 Sex: male   REQUESTING PROVIDER:    hospital service   REASON FOR CONSULT:    Progression of right renal mass causing right renal artery and vein occlus and IVCocclusion  HISTORY OF PRESENT ILLNESS:   Joseph Ritter is a 76 y.o. male, who Was admittedfrom the emergency department with abdominal pain nausea vomiting and diarrhea.  He has inoperable transitional cell right renal carcinoma.  He is undergoing chemotherapy and radiation.a Foley catheter was placed which improved his pain.  The patient has a history of stage III renal disease, congestive heart failure, coronary artery diseas, status post CABG  PAST MEDICAL HISTORY    Past Medical History:  Diagnosis Date  . Acid indigestion 03/09/2014  . Adult hypothyroidism 11/18/2012  . Anxiety   . Arteriosclerosis of coronary artery 11/18/2012   Overview:  PATENT LIMA TO LAD, PATENT SVG TO PDA AND OCCULUDED SVG TO OM2 BY CATHERIZATION 02/09/2009   . 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 Borderline diabetes 05/25/2014  . BP (high blood pressure) 11/18/2012  . BPH (benign prostatic hypertrophy)   . CAFL (chronic airflow limitation) ([place]) 05/03/2015  . Cancer ([place])    renal cancer with mets  . CCF (congestive cardiac failure) ([place]) 01/03/2014   Overview:  HX OF   . Clinical depression 05/03/2015  . COPD (chronic obstructive pulmonary disease) ([place])   . DDD (degenerative disc disease), lumbar 06/29/2014  . 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 Gastric ulcer 03/09/2014  . 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 History of urinary self-catheterization 2017  . HLD (hyperlipidemia) 01/03/2014  . Incomplete bladder emptying 11/18/2012  . Leg weakness 11/18/2012  .  Lumbar radiculitis   . Neuritis or radiculitis due to rupture of lumbar intervertebral disc 06/29/2014  . Overactive bladder   . PONV (postoperative nausea and vomiting)    years ago with Ether, no problem with Nausea or vomiting with the last few surgerys  . Pre-diabetes   . 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 Teeth problem    pt reports "bad teeth", "need to be pulled"     FAMILY HISTORY   Family History  Problem Relation Age of Onset  . Heart attack Mother   . Heart disease Father   . Prostate cancer Neg Hx   . Bladder Cancer Neg Hx   . Kidney cancer Neg Hx     SOCIAL HISTORY:   Social History   Social History  . Marital status: Married    Spouse name: N/A  . Number of children: N/A  . Years of education: N/A   Occupational History  . Not on file.   Social History Main Topics  . Smoking status: Former Smoker    Types: Cigarettes    Quit date: 04/07/2002  . Smokeless tobacco: Former Systems developer    Types: Snuff, Chew  . Alcohol use No  . Drug use: No  . Sexual activity: Not on file   Other Topics Concern  . Not on file   Social History Narrative  . No narrative on file    ALLERGIES:    No Known Allergies  CURRENT MEDICATIONS:    Current Facility-Administered Medications  Medication Dose Route Frequency Provider Last Rate Last Dose  . 0.9 %  sodium chloride infusion   Intravenous Continuous Hugelmeyer, Alexis, DO 75 mL/hr at 07/07/17 0241    . acetaminophen (TYLENOL) tablet 500 mg  500 mg Oral BID PRN Hugelmeyer, Alexis, DO      . albuterol (PROVENTIL) (2.5 MG/3ML) 0.083% nebulizer solution 2.5 mg  2.5 mg Nebulization Q6H PRN Hugelmeyer, Alexis, DO      . aspirin EC tablet 81 mg  81 mg Oral Daily Hugelmeyer, Alexis, DO   81 mg at 07/07/17 1354  . buPROPion (WELLBUTRIN SR) 12 hr tablet 100 mg  100 mg Oral Daily Hugelmeyer, Alexis, DO      . cyclobenzaprine (FLEXERIL) tablet 10 mg  10 mg Oral QHS PRN Hugelmeyer, Alexis, DO      . simvastatin  (ZOCOR) tablet 40 mg  40 mg Oral q1800 Hugelmeyer, Alexis, DO       And  . ezetimibe (ZETIA) tablet 10 mg  10 mg Oral Daily Hugelmeyer, Alexis, DO      . fentaNYL (Waller - dosed mcg/hr) patch 25 mcg  25 mcg Transdermal Q72H Loletha Grayer, MD   25 mcg at 07/07/17 1353  . guaiFENesin (MUCINEX) 12 hr tablet 600 mg  600 mg Oral BID PRN Hugelmeyer, Alexis, DO      . heparin injection 5,000 Units  5,000 Units Subcutaneous Q8H Hugelmeyer, Alexis, DO   5,000 Units at 07/07/17 0817  . HYDROcodone-acetaminophen (NORCO) 7.5-325 MG per tablet 2 tablet  2 tablet Oral Q6H PRN Hugelmeyer, Alexis, DO      . hyoscyamine (ANASPAZ) disintergrating tablet 0.125 mg  0.125 mg Sublingual Q6H PRN Hugelmeyer, Alexis, DO      . [START ON 07/08/2017] Influenza vac split quadrivalent PF (FLUZONE HIGH-DOSE) injection 0.5 mL  0.5 mL Intramuscular Tomorrow-1000 Hugelmeyer, Alexis, DO      . ipratropium (ATROVENT) nebulizer solution 0.5 mg  0.5 mg Nebulization Q6H PRN Hugelmeyer, Alexis, DO      . [START ON 07/08/2017] levothyroxine (SYNTHROID, LEVOTHROID) tablet 125 mcg  125 mcg Oral QAC breakfast Wieting, Richard, MD      . LORazepam (ATIVAN) tablet 0.5 mg  0.5 mg Oral Q8H PRN Hugelmeyer, Alexis, DO      . morphine (MS CONTIN) 12 hr tablet 60 mg  60 mg Oral Q12H Hugelmeyer, Alexis, DO      . morphine 2 MG/ML injection 2 mg  2 mg Intravenous Q3H PRN Loletha Grayer, MD   2 mg at 07/07/17 1401  . multivitamin with minerals tablet 1 tablet  1 tablet Oral QPC breakfast Hugelmeyer, Alexis, DO   1 tablet at 07/07/17 1354  . ondansetron (ZOFRAN) tablet 4 mg  4 mg Oral Q6H PRN Hugelmeyer, Alexis, DO       Or  . ondansetron (ZOFRAN) injection 4 mg  4 mg Intravenous Q6H PRN Hugelmeyer, Alexis, DO   4 mg at 07/07/17 1353  . pantoprazole (PROTONIX) EC tablet 40 mg  40 mg Oral Daily Hugelmeyer, Alexis, DO   40 mg at 07/07/17 1354  . piperacillin-tazobactam (ZOSYN) IVPB 3.375 g  3.375 g Intravenous Q8H Carrie Mew, MD    Stopped at 07/07/17 1421  . polyethylene glycol powder (GLYCOLAX/MIRALAX) container 17 g  17 g Oral QPC breakfast Hugelmeyer, Alexis, DO      . predniSONE (DELTASONE) tablet 10 mg  10 mg Oral Q breakfast Hugelmeyer, Alexis, DO   10 mg at 07/07/17 1354  . prochlorperazine (COMPAZINE) tablet 10 mg  10 mg Oral Q6H PRN Hugelmeyer, Alexis, DO      . ramipril (  ALTACE) capsule 10 mg  10 mg Oral QPC breakfast Hugelmeyer, Alexis, DO   10 mg at 07/07/17 1353  . senna (SENOKOT) tablet 17.2 mg  2 tablet Oral Daily Hugelmeyer, Alexis, DO   17.2 mg at 07/07/17 1354  . sertraline (ZOLOFT) tablet 100 mg  100 mg Oral QPC breakfast Hugelmeyer, Alexis, DO   100 mg at 07/07/17 1354  . sodium chloride flush (NS) 0.9 % injection 3 mL  3 mL Intravenous Q12H Hugelmeyer, Alexis, DO   3 mL at 07/07/17 0819  . sodium phosphate (FLEET) 7-19 GM/118ML enema 1 enema  1 enema Rectal Daily PRN Loletha Grayer, MD   1 enema at 07/07/17 0941  . tamsulosin (FLOMAX) capsule 0.8 mg  0.8 mg Oral QPC supper Hugelmeyer, Alexis, DO      . vancomycin (VANCOCIN) 1,250 mg in sodium chloride 0.9 % 250 mL IVPB  1,250 mg Intravenous Q24H Carrie Mew, MD   Stopped at 07/07/17 0607   Facility-Administered Medications Ordered in Other Encounters  Medication Dose Route Frequency Provider Last Rate Last Dose  . heparin lock flush 100 unit/mL  500 Units Intravenous Once Earlie Server, MD      . sodium chloride flush (NS) 0.9 % injection 10 mL  10 mL Intravenous PRN Earlie Server, MD   10 mL at 07/03/17 0930    REVIEW OF SYSTEMS:   [X]  denotes positive finding, [ ]  denotes negative finding Cardiac  Comments:  Chest pain or chest pressure:    Shortness of breath upon exertion:    Short of breath when lying flat:    Irregular heart rhythm:        Vascular    Pain in calf, thigh, or hip brought on by ambulation:    Pain in feet at night that wakes you up from your sleep:     Blood clot in your veins:    Leg swelling:         Pulmonary      Oxygen at home:    Productive cough:     Wheezing:         Neurologic    Sudden weakness in arms or legs:   Generalized weakness  Sudden numbness in arms or legs:     Sudden onset of difficulty speaking or slurred speech:    Temporary loss of vision in one eye:     Problems with dizziness:         Gastrointestinal    Blood in stool:   abd pain   Vomited blood:         Genitourinary    Burning when urinating:     Blood in urine:        Psychiatric    Major depression:         Hematologic    Bleeding problems:    Problems with blood clotting too easily:        Skin    Rashes or ulcers:        Constitutional    Fever or chills:     PHYSICAL EXAM:   Vitals:   07/07/17 0100 07/07/17 0153 07/07/17 0200 07/07/17 0528  BP: (!) 132/103 (!) 91/57  (!) 150/54  Pulse: 90 (!) 110  86  Resp:  18  20  Temp:  98.2 F (36.8 C)  98.4 F (36.9 C)  TempSrc:    Oral  SpO2: 98% 99%  98%  Weight:   145 lb (65.8 kg)   Height:   5'  7" (1.702 m)     GENERAL: The patient is a well-nourished male, in no acute distress. The vital signs are documented above. CARDIAC: There is a regular rate and rhythm.  PULMONARY: Nonlabored respirations ABDOMEN: Soft and non-tender with normal pitched bowel sounds.  MUSCULOSKELETAL: There are no major deformities or cyanosis. NEUROLOGIC: No focal weakness or paresthesias are detected. SKIN: There are no ulcers or rashes noted. PSYCHIATRIC: The patient has a normal affect.  STUDIES:   I have reviewed the CT scan with the following findings: 1. Progression of mass at the right renal hilum, which now invades and occludes the right renal artery and vein and the inferior vena cava. The mass also encases approximately 270 degrees of the aorta and invades the anterior aspect of the L2 vertebral body. 2. No focal gastrointestinal abnormality. 3. Aortic atherosclerosis   ASSESSMENT and PLAN   Progression of non-operable right renal cancer with  subsequent occlusion of the renal artery and vein and inferior vena cava tumor thrombus encroachment.  No acute vascular surgical intervention is needed.  The only question would be whether or not he needs to be anticoagulated because of the thrombus extension into the vena cava.  I would defer this question to oncology.   Annamarie Major, MD [facility] of [facility] [phone] Pager [phone]

## 2017-07-07 NOTE — Consult Note (Signed)
Subjective: CC: Need for catheter.  Hx:  Joseph Ritter is a 76 yo [facility] patient of Dr. 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   I was asked to see him in consultation by Dr. Loletha Grayer following and admission for increased abdominal pain and possible urinary sepsis.   The pain is severe and involves the bilateral lower quadrants and mid back.  He has also had worsening weakness.    The tumor is now progressively encasing the aorta and invades L2.   The family had requested a foley because of his declining condition and that was to be arranged as an outpatient but he was admitted before it could be done.   He has had some recent difficulty with CIC with pain and inability to pass the catheter.    ROS:  Review of Systems  Constitutional: Negative for chills and fever.  Respiratory: Negative for shortness of breath.   Cardiovascular: Negative for chest pain.  Gastrointestinal: Positive for abdominal pain. Negative for nausea and vomiting.  Neurological: Positive for focal weakness (legs).  All other systems reviewed and are negative.   No Known Allergies  Past Medical History:  Diagnosis Date  . Acid indigestion 03/09/2014  . Adult hypothyroidism 11/18/2012  . Anxiety   . Arteriosclerosis of coronary artery 11/18/2012   Overview:  PATENT LIMA TO LAD, PATENT SVG TO PDA AND OCCULUDED SVG TO OM2 BY CATHERIZATION 02/09/2009   . 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 Borderline diabetes 05/25/2014  . BP (high blood pressure) 11/18/2012  . BPH (benign prostatic hypertrophy)   . CAFL (chronic airflow limitation) ([place]) 05/03/2015  . Cancer ([place])    renal cancer with mets  . CCF (congestive cardiac failure) ([place]) 01/03/2014   Overview:  HX OF   . Clinical depression  05/03/2015  . COPD (chronic obstructive pulmonary disease) ([place])   . DDD (degenerative disc disease), lumbar 06/29/2014  . 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 Gastric ulcer 03/09/2014  . 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 History of urinary self-catheterization 2017  . HLD (hyperlipidemia) 01/03/2014  . Incomplete bladder emptying 11/18/2012  . Leg weakness 11/18/2012  . Lumbar radiculitis   . Neuritis or radiculitis due to rupture of lumbar intervertebral disc 06/29/2014  . Overactive bladder   . PONV (postoperative nausea and vomiting)    years ago with Ether, no problem with Nausea or vomiting with the last few surgerys  . Pre-diabetes   . Subacute transverse myelitis ([place]) 11/21/2012  . Teeth problem    pt reports "bad teeth", "need to be pulled"    Past Surgical History:  Procedure Laterality Date  . ARTERY BIOPSY Right 12/16/2015   Procedure: BIOPSY TEMPORAL ARTERY;  Surgeon: Algernon Huxley, MD;  Location: [facility];  Service: Vascular;  Laterality: Right;  . CARDIAC CATHETERIZATION    . CATARACT EXTRACTION W/PHACO Left 01/26/2016   Procedure: CATARACT EXTRACTION PHACO AND INTRAOCULAR LENS PLACEMENT (IOC) LEFT EYE;  Surgeon: Leandrew Koyanagi, MD;  Location: [facility];  Service: Ophthalmology;  Laterality: Left;  . CORONARY ARTERY BYPASS GRAFT  04/07/2002   [facility]  . Cysto Bladder Botox Injection  07/02/2014  .  CYSTOSCOPY W/ RETROGRADES Right 03/16/2017   Procedure: CYSTOSCOPY WITH RETROGRADE PYELOGRAM;  Surgeon: [name], MD;  Location: [facility];  Service: Urology;  Laterality: Right;  . CYSTOSCOPY WITH STENT PLACEMENT Right 03/16/2017   Procedure: CYSTOSCOPY WITH STENT PLACEMENT;  Surgeon: [name], MD;  Location: [facility];  Service: Urology;  Laterality: Right;  . EYE SURGERY Left    blood clot behind left eye  . HAND SURGERY Bilateral   . HERNIA  REPAIR Right    inguinal hernia repair  . LEFT HEART CATH AND CORS/GRAFTS ANGIOGRAPHY N/A 04/25/2017   Procedure: Left Heart Cath and Cors/Grafts Angiography;  Surgeon: Isaias Cowman, MD;  Location: [place] [facility];  Service: Cardiovascular;  Laterality: N/A;  . PORTA CATH INSERTION N/A 05/30/2017   Procedure: Glori Luis Cath Insertion;  Surgeon: Katha Cabal, MD;  Location: [place] [facility];  Service: Cardiovascular;  Laterality: N/A;  . ROBOT ASSITED LAPAROSCOPIC NEPHROURETERECTOMY Right 05/02/2017   Procedure: ROBOT ASSITED LAPAROSCOPIC NEPHROURETERECTOMY ATTEMPTED;  Surgeon: [name], MD;  Location: [facility];  Service: Urology;  Laterality: Right;  . URETEROSCOPY Right 03/16/2017   Procedure: URETEROSCOPY BIOPSY RENAL MASS;  Surgeon: [name], MD;  Location: [facility];  Service: Urology;  Laterality: Right;    Social History   Social History  . Marital status: Married    Spouse name: N/A  . Number of children: N/A  . Years of education: N/A   Occupational History  . Not on file.   Social History Main Topics  . Smoking status: Former Smoker    Types: Cigarettes    Quit date: 04/07/2002  . Smokeless tobacco: Former Systems developer    Types: Snuff, Chew  . Alcohol use No  . Drug use: No  . Sexual activity: Not on file   Other Topics Concern  . Not on file   Social History Narrative  . No narrative on file    Family History  Problem Relation Age of Onset  . Heart attack Mother   . Heart disease Father   . Prostate cancer Neg Hx   . Bladder Cancer Neg Hx   . Kidney cancer Neg Hx     Anti-infectives: Anti-infectives    Start     Dose/Rate Route Frequency Ordered Stop   07/07/17 0500  vancomycin (VANCOCIN) 1,250 mg in sodium chloride 0.9 % 250 mL IVPB     1,250 mg 166.7 mL/hr over 90 Minutes Intravenous Every 24 hours 07/06/17 2254     07/07/17 0400  piperacillin-tazobactam (ZOSYN) IVPB 3.375 g     3.375 g 12.5 mL/hr over 240 Minutes  Intravenous Every 8 hours 07/06/17 2254     07/06/17 1945  piperacillin-tazobactam (ZOSYN) IVPB 3.375 g     3.375 g 100 mL/hr over 30 Minutes Intravenous  Once 07/06/17 1938 07/06/17 2044   07/06/17 1945  vancomycin (VANCOCIN) IVPB 1000 mg/200 mL premix     1,000 mg 200 mL/hr over 60 Minutes Intravenous  Once 07/06/17 1938 07/06/17 2214      Current Facility-Administered Medications  Medication Dose Route Frequency Provider Last Rate Last Dose  . 0.9 %  sodium chloride infusion   Intravenous Continuous Hugelmeyer, Alexis, DO 75 mL/hr at 07/07/17 0241    . acetaminophen (TYLENOL) tablet 500 mg  500 mg Oral BID PRN Hugelmeyer, Alexis, DO      . albuterol (PROVENTIL) (2.5 MG/3ML) 0.083% nebulizer solution 2.5 mg  2.5 mg Nebulization Q6H PRN Hugelmeyer, Alexis, DO      .  aspirin EC tablet 81 mg  81 mg Oral Daily Hugelmeyer, Alexis, DO      . buPROPion (WELLBUTRIN SR) 12 hr tablet 100 mg  100 mg Oral Daily Hugelmeyer, Alexis, DO      . cyclobenzaprine (FLEXERIL) tablet 10 mg  10 mg Oral QHS PRN Hugelmeyer, Alexis, DO      . simvastatin (ZOCOR) tablet 40 mg  40 mg Oral q1800 Hugelmeyer, Alexis, DO       And  . ezetimibe (ZETIA) tablet 10 mg  10 mg Oral Daily Hugelmeyer, Alexis, DO      . guaiFENesin (MUCINEX) 12 hr tablet 600 mg  600 mg Oral BID PRN Hugelmeyer, Alexis, DO      . heparin injection 5,000 Units  5,000 Units Subcutaneous Q8H Hugelmeyer, Alexis, DO   5,000 Units at 07/07/17 0817  . HYDROcodone-acetaminophen (NORCO) 7.5-325 MG per tablet 2 tablet  2 tablet Oral Q6H PRN Hugelmeyer, Alexis, DO      . hyoscyamine (ANASPAZ) disintergrating tablet 0.125 mg  0.125 mg Sublingual Q6H PRN Hugelmeyer, Alexis, DO      . [START ON 07/08/2017] Influenza vac split quadrivalent PF (FLUZONE HIGH-DOSE) injection 0.5 mL  0.5 mL Intramuscular Tomorrow-1000 Hugelmeyer, Alexis, DO      . ipratropium (ATROVENT) nebulizer solution 0.5 mg  0.5 mg Nebulization Q6H PRN Hugelmeyer, Alexis, DO      .  levothyroxine (SYNTHROID, LEVOTHROID) tablet 100 mcg  100 mcg Oral QAC breakfast Hugelmeyer, Alexis, DO      . LORazepam (ATIVAN) tablet 0.5 mg  0.5 mg Oral Q8H PRN Hugelmeyer, Alexis, DO      . morphine (MS CONTIN) 12 hr tablet 60 mg  60 mg Oral Q12H Hugelmeyer, Alexis, DO      . morphine 2 MG/ML injection 2 mg  2 mg Intravenous Q3H PRN Loletha Grayer, MD   2 mg at 07/07/17 1120  . multivitamin with minerals tablet 1 tablet  1 tablet Oral QPC breakfast Hugelmeyer, Alexis, DO      . ondansetron (ZOFRAN) tablet 4 mg  4 mg Oral Q6H PRN Hugelmeyer, Alexis, DO       Or  . ondansetron (ZOFRAN) injection 4 mg  4 mg Intravenous Q6H PRN Hugelmeyer, Alexis, DO   4 mg at 07/07/17 0644  . pantoprazole (PROTONIX) EC tablet 40 mg  40 mg Oral Daily Hugelmeyer, Alexis, DO      . piperacillin-tazobactam (ZOSYN) IVPB 3.375 g  3.375 g Intravenous Vida Roller, MD 12.5 mL/hr at 07/07/17 1125 3.375 g at 07/07/17 1125  . polyethylene glycol powder (GLYCOLAX/MIRALAX) container 17 g  17 g Oral QPC breakfast Hugelmeyer, Alexis, DO      . predniSONE (DELTASONE) tablet 10 mg  10 mg Oral Q breakfast Hugelmeyer, Alexis, DO      . prochlorperazine (COMPAZINE) tablet 10 mg  10 mg Oral Q6H PRN Hugelmeyer, Alexis, DO      . ramipril (ALTACE) capsule 10 mg  10 mg Oral QPC breakfast Hugelmeyer, Alexis, DO      . senna (SENOKOT) tablet 17.2 mg  2 tablet Oral Daily Hugelmeyer, Alexis, DO      . sertraline (ZOLOFT) tablet 100 mg  100 mg Oral QPC breakfast Hugelmeyer, Alexis, DO      . sodium chloride flush (NS) 0.9 % injection 3 mL  3 mL Intravenous Q12H Hugelmeyer, Alexis, DO   3 mL at 07/07/17 0819  . sodium phosphate (FLEET) 7-19 GM/118ML enema 1 enema  1 enema Rectal Daily PRN Loletha Grayer,  MD   1 enema at 07/07/17 0941  . tamsulosin (FLOMAX) capsule 0.8 mg  0.8 mg Oral QPC supper Hugelmeyer, Alexis, DO      . vancomycin (VANCOCIN) 1,250 mg in sodium chloride 0.9 % 250 mL IVPB  1,250 mg Intravenous Q24H Carrie Mew, MD   Stopped at 07/07/17 0607   Facility-Administered Medications Ordered in Other Encounters  Medication Dose Route Frequency Provider Last Rate Last Dose  . heparin lock flush 100 unit/mL  500 Units Intravenous Once Earlie Server, MD      . sodium chloride flush (NS) 0.9 % injection 10 mL  10 mL Intravenous PRN Earlie Server, MD   10 mL at 07/03/17 0930   Past medical, surgical, social and family history reviewed.   Objective: Vital signs in last 24 hours: Temp:  [97.5 F (36.4 C)-98.4 F (36.9 C)] 98.4 F (36.9 C) (10/13 0528) Pulse Rate:  [81-113] 86 (10/13 0528) Resp:  [11-24] 20 (10/13 0528) BP: (75-151)/(50-103) 150/54 (10/13 0528) SpO2:  [96 %-100 %] 98 % (10/13 0528) Weight:  [65.8 kg (145 lb)] 65.8 kg (145 lb) (10/13 0200)  Intake/Output from previous day: 10/12 0701 - 10/13 0700 In: 2250 [IV Piggyback:2250] Out: 400 [Emesis/NG output:400] Intake/Output this shift: Total I/O In: -  Out: 250 [Urine:150; Emesis/NG output:100]   Physical Exam  Constitutional: He is oriented to person, place, and time and well-developed, well-nourished, and in no distress.  HENT:  Head: Normocephalic and atraumatic.  Cardiovascular: Normal rate and regular rhythm.   Pulmonary/Chest: Effort normal. No respiratory distress.  Abdominal: Soft. There is tenderness (in the suprapubic area.).  Musculoskeletal: He exhibits no edema or tenderness.  Neurological: He is alert and oriented to person, place, and time.  Skin: Skin is warm and dry.  Psychiatric: Mood and affect normal.  Vitals reviewed.   Lab Results:   Recent Labs  07/06/17 1938  WBC 9.0  HGB 12.5*  HCT 36.9*  PLT 92*   BMET  Recent Labs  07/06/17 1938 07/07/17 0739  NA 135 139  K 4.6 3.9  CL 94* 100*  CO2 25 29  GLUCOSE 202* 123*  BUN 14 15  CREATININE 1.64* 1.27*  CALCIUM 9.6 8.8*   PT/INR  Recent Labs  07/06/17 1938  LABPROT 13.3  INR 1.02   ABG No results for input(s): PHART, HCO3 in the last  72 hours.  Invalid input(s): PCO2, PO2  Studies/Results: Ct Abdomen Pelvis W Contrast  Result Date: 07/06/2017 CLINICAL DATA:  Vomiting. 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 EXAM: CT ABDOMEN AND PELVIS WITH CONTRAST TECHNIQUE: Multidetector CT imaging of the abdomen and pelvis was performed using the standard protocol following bolus administration of intravenous contrast. CONTRAST:  90mL ISOVUE-300 IOPAMIDOL (ISOVUE-300) INJECTION 61% COMPARISON:  CT abdomen pelvis 05/03/2017 FINDINGS: Lower chest: No pulmonary nodules or pleural effusion. No visible pericardial effusion. Hepatobiliary: Normal hepatic contours and density. No visible biliary dilatation. Normal gallbladder. Pancreas: Normal contours without ductal dilatation. No peripancreatic fluid collection. Spleen: Normal. Adrenals/Urinary Tract: --Adrenal glands: Normal. --Right kidney/ureter: There is diffuse abnormality of the renal hilum with loss of the normal enhancement pattern of most of the renal parenchyma. This extends to the right-sided renal vessels and encases approximately 270 degrees of the aorta. There is also invasion of the inferior vena cava. Invasion into the adjacent vertebral body anterior wall has worsened. 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 There is periureteral stranding. --Left kidney/ureter:  Mild left hydroureteronephrosis without a clear obstructing lesion. The left kidney itself is normal. The left renal vein's inflow into the IVC is likely occluded. It may be draining via lumbar veins. --Urinary bladder: Distended but otherwise unremarkable. Stomach/Bowel: --Stomach/Duodenum: No hiatal hernia or other gastric abnormality. Normal duodenal course and caliber. --Small bowel: No dilatation or inflammation. --Colon: Moderate colonic stool volume. Mild sigmoid diverticulosis. --Appendix: Not visualized. No right lower quadrant inflammation or  free fluid. Vascular/Lymphatic: There is invasion of the inferior vena cava, encasement of the aorta and obliteration of the right renal vessels, as described above. There is atherosclerotic calcification of the non aneurysmal abdominal aorta. No abdominal or pelvic lymphadenopathy. Reproductive: Normal prostate and seminal vesicles. Musculoskeletal. There is invasion of the L2 vertebral body by the above-described mass of the renal hilum. Prominent soft tissue along the ventral aspect of the lumbar spinal canal may indicate dilated venous plexus, as described on the prior MRI. Other: Small amount of fluid in the left inguinal canal. IMPRESSION: 1. Progression of mass at the right renal hilum, which now invades and occludes the right renal artery and vein and the inferior vena cava. The mass also encases approximately 270 degrees of the aorta and invades the anterior aspect of the L2 vertebral body. 2. No focal gastrointestinal abnormality. 3. Aortic atherosclerosis (ICD10-I70.0). Electronically Signed   By: Ulyses Jarred M.D.   On: 07/06/2017 22:24   Dg Chest Port 1 View  Result Date: 07/06/2017 CLINICAL DATA:  Vomiting today. The patient is undergoing chemotherapy for urothelial carcinoma. EXAM: PORTABLE CHEST 1 VIEW COMPARISON:  Single-view of the chest 03/23/2017. CT chest, abdomen and pelvis 05/03/2017. FINDINGS: Port-A-Cath is in place. The patient is status post CABG. Lungs are clear. Heart size is normal. No pneumothorax or pleural effusion. No acute bony abnormality. IMPRESSION: No acute disease. Electronically Signed   By: Inge Rise M.D.   On: 07/06/2017 20:18   I have reviewed his notes, labs and the CT films and report.   Assessment: Neurogenic bladder on CIC admitted with sepsis and retention.   I had a foley placed and it drained several hundred ml of concentrated urine and he had relief of his abdominal pain.    He will need to foley changed on a monthly basis.  I will request a f/u  at [facility] urology.   Right renal Urothelial carcinoma with local hilar invasion with aortic incasement and L2 invasion.   He is currently seen Dr. Tasia Catchings in Oncology for this issue and completed radiation therapy by Dr. Baruch Gouty.   There is nothing to add from a urologic standpoint.    CC: Dr. Loletha Grayer and Dr.Bryan Ferd Glassing 07/07/2017 [phone]

## 2017-07-08 DIAGNOSIS — N319 Neuromuscular dysfunction of bladder, unspecified: Secondary | ICD-10-CM

## 2017-07-08 DIAGNOSIS — R1084 Generalized abdominal pain: Secondary | ICD-10-CM

## 2017-07-08 DIAGNOSIS — A419 Sepsis, unspecified organism: Secondary | ICD-10-CM

## 2017-07-08 DIAGNOSIS — R338 Other retention of urine: Secondary | ICD-10-CM

## 2017-07-08 LAB — BASIC METABOLIC PANEL
Anion gap: 13 (ref 5–15)
BUN: 16 mg/dL (ref 6–20)
CALCIUM: 8.3 mg/dL — AB (ref 8.9–10.3)
CO2: 26 mmol/L (ref 22–32)
CREATININE: 1.27 mg/dL — AB (ref 0.61–1.24)
Chloride: 100 mmol/L — ABNORMAL LOW (ref 101–111)
GFR calc Af Amer: >60 mL/min (ref >60)
GFR, EST NON AFRICAN AMERICAN: 54 mL/min — AB (ref >60)
GLUCOSE: 80 mg/dL (ref 65–99)
POTASSIUM: 3.5 mmol/L (ref 3.5–5.1)
SODIUM: 139 mmol/L (ref 135–145)

## 2017-07-08 LAB — CBC
HCT: 26 % — ABNORMAL LOW (ref 40.0–52.0)
Hemoglobin: 8.7 g/dL — ABNORMAL LOW (ref 13.0–18.0)
MCH: 29 pg (ref 26.0–34.0)
MCHC: 33.6 g/dL (ref 32.0–36.0)
MCV: 86.2 fL (ref 80.0–100.0)
PLATELETS: 59 10*3/uL — AB (ref 150–440)
RBC: 3.02 MIL/uL — AB (ref 4.40–5.90)
RDW: 17.6 % — AB (ref 11.5–14.5)
WBC: 3.7 10*3/uL — ABNORMAL LOW (ref 3.8–10.6)

## 2017-07-08 MED ORDER — ENSURE ENLIVE PO LIQD
237.0000 mL | Freq: Two times a day (BID) | ORAL | Status: DC
  Administered 2017-07-09 – 2017-07-10 (×4): 237 mL via ORAL

## 2017-07-08 MED ORDER — POLYETHYLENE GLYCOL 3350 17 G PO PACK: 17.0000 g | PACK | Freq: Every day | ORAL | Status: DC

## 2017-07-08 MED ORDER — MAGNESIUM SULFATE 2 GM/50ML IV SOLN
2.0000 g | Freq: Once | INTRAVENOUS | Status: AC
Start: 2017-07-08 — End: 2017-07-08
  Administered 2017-07-08: 10:00:00 2 g via INTRAVENOUS
  Filled 2017-07-08: qty 50

## 2017-07-08 MED ORDER — POTASSIUM CHLORIDE CRYS ER 20 MEQ PO TBCR
40.0000 meq | EXTENDED_RELEASE_TABLET | Freq: Once | ORAL | Status: AC
  Administered 2017-07-08: 40 meq via ORAL
  Filled 2017-07-08: qty 2

## 2017-07-08 MED ORDER — POLYETHYLENE GLYCOL 3350 17 G PO PACK
17.0000 g | PACK | Freq: Every day | ORAL | Status: DC
  Administered 2017-07-08 – 2017-07-10 (×3): 17 g via ORAL
  Filled 2017-07-08 (×3): qty 1

## 2017-07-08 NOTE — Progress Notes (Signed)
Dr Leslye Peer present and made aware that CCMD reports pt had a 6 beat run of vtach, MD acknowledged, no new orders

## 2017-07-08 NOTE — Progress Notes (Signed)
Patient ID: Joseph Ritter, male   DOB: 1940/10/22, 76 y.o.   MRN: [id number]  [facility] PROGRESS NOTE  YAHIA BOTTGER RXV:[id number] DOB: 1940/11/30 DOA: 07/06/2017 PCP: [name], MD  HPI/Subjective: Patient feeling much better with regards to his pain.  Patient very concerned about being prescribed Eliquis. He does not want to take Eliquis.  Objective: Vitals:   07/08/17 0435 07/08/17 1516  BP: (!) 154/61 (!) 118/52  Pulse: 77 63  Resp: 18   Temp: 98.2 F (36.8 C) 98.2 F (36.8 C)  SpO2: 97% 97%    Filed Weights   07/07/17 0200  Weight: 65.8 kg (145 lb)    ROS: Review of Systems  Constitutional: Negative for chills and fever.  Eyes: Negative for blurred vision.  Respiratory: Negative for cough and shortness of breath.   Cardiovascular: Negative for chest pain.  Gastrointestinal: Negative for abdominal pain, constipation, diarrhea, nausea and vomiting.  Genitourinary: Negative for dysuria.  Musculoskeletal: Negative for joint pain.  Neurological: Negative for dizziness and headaches.   Exam: Physical Exam  Constitutional: He is oriented to person, place, and time.  HENT:  Nose: No mucosal edema.  Mouth/Throat: No oropharyngeal exudate or posterior oropharyngeal edema.  Eyes: Pupils are equal, round, and reactive to light. Conjunctivae, EOM and lids are normal.  Neck: No JVD present. Carotid bruit is not present. No edema present. No thyroid mass and no thyromegaly present.  Cardiovascular: S1 normal and S2 normal.  Exam reveals no gallop.   No murmur heard. Pulses:      Dorsalis pedis pulses are 2+ on the right side, and 2+ on the left side.  Respiratory: No respiratory distress. He has no wheezes. He has no rhonchi. He has no rales.  GI: Soft. Bowel sounds are normal. There is no tenderness.  Musculoskeletal:       Right ankle: He exhibits no swelling.       Left ankle: He exhibits no swelling.  Lymphadenopathy:    He has no cervical  adenopathy.  Neurological: He is alert and oriented to person, place, and time. No cranial nerve deficit.  Skin: Skin is warm. No rash noted. Nails show no clubbing.  Psychiatric: He has a normal mood and affect.      Data Reviewed: Basic Metabolic Panel:  Recent Labs Lab 07/03/17 0932 07/03/17 1116 07/06/17 1938 07/07/17 0204 07/07/17 0739 07/08/17 0542  NA 134* 135 135  --  139 139  K 3.7 3.7 4.6  --  3.9 3.5  CL 96* 97* 94*  --  100* 100*  CO2 26 25 25   --  29 26  GLUCOSE 136* 148* 202*  --  123* 80  BUN 18 20 14   --  15 16  CREATININE 1.14 1.29* 1.64*  --  1.27* 1.27*  CALCIUM 8.9 9.1 9.6  --  8.8* 8.3*  MG  --   --   --  1.9  --   --   PHOS  --   --   --  4.0  --   --    Liver Function Tests:  Recent Labs Lab 07/03/17 0932 07/06/17 1938 07/07/17 0739  AST 31 45* 27  ALT 44 43 34  ALKPHOS 110 111 88  BILITOT 0.4 0.6 0.9  PROT 6.7 7.5 6.3*  ALBUMIN 3.3* 3.7 3.1*    Recent Labs Lab 07/06/17 1938  LIPASE 15   No results for input(s): AMMONIA in the last 168 hours. CBC:  Recent Labs Lab  07/03/17 0932 07/03/17 1116 07/06/17 1938 07/08/17 0542  WBC 3.1* 3.9 9.0 3.7*  NEUTROABS 2.1  --  7.8*  --   HGB 10.6* 11.5* 12.5* 8.7*  HCT 31.6* 34.0* 36.9* 26.0*  MCV 85.1 84.9 86.0 86.2  PLT 123* 119* 92* 59*   Cardiac Enzymes:  Recent Labs Lab 07/03/17 1357 07/06/17 1938 07/07/17 0204 07/07/17 0739 07/07/17 1412  TROPONINI <0.03 0.11* 0.07* 0.07* 0.05*     Recent Results (from the past 240 hour(s))  Blood Culture (routine x 2)     Status: None (Preliminary result)   Collection Time: 07/06/17  7:39 PM  Result Value Ref Range Status   Specimen Description BLOOD RIGHT ANTECUBITAL  Final   Special Requests   Final    BOTTLES DRAWN AEROBIC AND ANAEROBIC Blood Culture adequate volume   Culture NO GROWTH 2 DAYS  Final   Report Status PENDING  Incomplete  Blood Culture (routine x 2)     Status: None (Preliminary result)   Collection Time: 07/06/17   7:39 PM  Result Value Ref Range Status   Specimen Description BLOOD BLOOD RIGHT WRIST  Final   Special Requests   Final    BOTTLES DRAWN AEROBIC AND ANAEROBIC Blood Culture adequate volume   Culture NO GROWTH 2 DAYS  Final   Report Status PENDING  Incomplete  Urine culture     Status: Abnormal (Preliminary result)   Collection Time: 07/06/17 11:00 PM  Result Value Ref Range Status   Specimen Description URINE, RANDOM  Final   Special Requests NONE  Final   Culture (A)  Final    30,000 COLONIES/mL STAPHYLOCOCCUS SPECIES (COAGULASE NEGATIVE)   Report Status PENDING  Incomplete     Studies: Ct Abdomen Pelvis W Contrast  Result Date: 07/06/2017 CLINICAL DATA:  Vomiting. 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 EXAM: CT ABDOMEN AND PELVIS WITH CONTRAST TECHNIQUE: Multidetector CT imaging of the abdomen and pelvis was performed using the standard protocol following bolus administration of intravenous contrast. CONTRAST:  31mL ISOVUE-300 IOPAMIDOL (ISOVUE-300) INJECTION 61% COMPARISON:  CT abdomen pelvis 05/03/2017 FINDINGS: Lower chest: No pulmonary nodules or pleural effusion. No visible pericardial effusion. Hepatobiliary: Normal hepatic contours and density. No visible biliary dilatation. Normal gallbladder. Pancreas: Normal contours without ductal dilatation. No peripancreatic fluid collection. Spleen: Normal. Adrenals/Urinary Tract: --Adrenal glands: Normal. --Right kidney/ureter: There is diffuse abnormality of the renal hilum with loss of the normal enhancement pattern of most of the renal parenchyma. This extends to the right-sided renal vessels and encases approximately 270 degrees of the aorta. There is also invasion of the inferior vena cava. Invasion into the adjacent vertebral body anterior wall has worsened. 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 There is periureteral stranding. --Left kidney/ureter: Mild left  hydroureteronephrosis without a clear obstructing lesion. The left kidney itself is normal. The left renal vein's inflow into the IVC is likely occluded. It may be draining via lumbar veins. --Urinary bladder: Distended but otherwise unremarkable. Stomach/Bowel: --Stomach/Duodenum: No hiatal hernia or other gastric abnormality. Normal duodenal course and caliber. --Small bowel: No dilatation or inflammation. --Colon: Moderate colonic stool volume. Mild sigmoid diverticulosis. --Appendix: Not visualized. No right lower quadrant inflammation or free fluid. Vascular/Lymphatic: There is invasion of the inferior vena cava, encasement of the aorta and obliteration of the right renal vessels, as described above. There is atherosclerotic calcification of the non aneurysmal abdominal aorta. No abdominal or pelvic lymphadenopathy. Reproductive: Normal prostate and  seminal vesicles. Musculoskeletal. There is invasion of the L2 vertebral body by the above-described mass of the renal hilum. Prominent soft tissue along the ventral aspect of the lumbar spinal canal may indicate dilated venous plexus, as described on the prior MRI. Other: Small amount of fluid in the left inguinal canal. IMPRESSION: 1. Progression of mass at the right renal hilum, which now invades and occludes the right renal artery and vein and the inferior vena cava. The mass also encases approximately 270 degrees of the aorta and invades the anterior aspect of the L2 vertebral body. 2. No focal gastrointestinal abnormality. 3. Aortic atherosclerosis (ICD10-I70.0). Electronically Signed   By: Ulyses Jarred M.D.   On: 07/06/2017 22:24   Dg Chest Port 1 View  Result Date: 07/06/2017 CLINICAL DATA:  Vomiting today. The patient is undergoing chemotherapy for urothelial carcinoma. EXAM: PORTABLE CHEST 1 VIEW COMPARISON:  Single-view of the chest 03/23/2017. CT chest, abdomen and pelvis 05/03/2017. FINDINGS: Port-A-Cath is in place. The patient is status post  CABG. Lungs are clear. Heart size is normal. No pneumothorax or pleural effusion. No acute bony abnormality. IMPRESSION: No acute disease. Electronically Signed   By: Inge Rise M.D.   On: 07/06/2017 20:18    Scheduled Meds: . aspirin EC  81 mg Oral Daily  . buPROPion  100 mg Oral Daily  . simvastatin  40 mg Oral q1800   And  . ezetimibe  10 mg Oral Daily  . fentaNYL  25 mcg Transdermal Q72H  . Influenza vac split quadrivalent PF  0.5 mL Intramuscular Tomorrow-1000  . levothyroxine  125 mcg Oral QAC breakfast  . morphine  60 mg Oral Q12H  . multivitamin with minerals  1 tablet Oral QPC breakfast  . pantoprazole  40 mg Oral Daily  . polyethylene glycol  17 g Oral QPC breakfast  . predniSONE  10 mg Oral Q breakfast  . ramipril  10 mg Oral QPC breakfast  . senna  2 tablet Oral Daily  . sertraline  100 mg Oral QPC breakfast  . sodium chloride flush  3 mL Intravenous Q12H  . tamsulosin  0.8 mg Oral QPC supper   Continuous Infusions: . sodium chloride 30 mL/hr at 07/08/17 1303  . piperacillin-tazobactam (ZOSYN)  IV 3.375 g (07/08/17 1158)    Assessment/Plan:  1.  Severe abdominal pain.  Likely secondary to constipation and urinary retention. Pain relieved after Foley catheter placement and Fleet enema. I did prescribe fentanyl patch. Continue oral pain medications also.  2. 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 This is encroaching on the renal vein and artery and inferior vena cava. The patient absolutely does not want to be on blood thinner and does not want to take Eliquis. Overall prognosis is poor. Patient made a DO NOT RESUSCITATE yesterday. 3. Nausea and vomiting could be secondary to abdominal process. Advance diet to soft diet. Also withdrawal from pain medication could have been contributing to this. 4. Sepsis ruled out. Antibiotics stopped. 5. Hypothyroidism unspecified on levothyroxine 6. Hyperlipidemia unspecified on Zocor and Zetia 7. BPH on  Flomax 8. GERD on Protonix 9. Depression on Zoloft and Wellbutrin 10. Nonsustained ventricular tachycardia likely secondary to electrolyte abnormalities. IV magnesium given an oral potassium given. 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  Code Status:     Code Status Orders        Start     Ordered   07/07/17 1345  Do not attempt resuscitation (  DNR)  Continuous    Question Answer Comment  In the event of cardiac or respiratory ARREST Do not call a "code blue"   In the event of cardiac or respiratory ARREST Do not perform Intubation, CPR, defibrillation or ACLS   In the event of cardiac or respiratory ARREST Use medication by any route, position, wound care, and other measures to relive pain and suffering. May use oxygen, suction and manual treatment of airway obstruction as needed for comfort.   Comments nurse may pronounce      07/07/17 1344    Code Status History    Date Active Date Inactive Code Status Order ID Comments User Context   07/07/2017  1:41 AM 07/07/2017  1:44 PM Full Code [id number]  Harvie Bridge, DO Inpatient   05/02/2017  5:04 PM 05/04/2017  6:51 PM Full Code [id number]  [name], MD Inpatient   04/25/2017  8:51 AM 04/25/2017  2:36 PM Full Code [id number]  Isaias Cowman, MD Inpatient    Advance Directive Documentation     Most Recent Value  Type of Advance Directive  Living will  Pre-existing out of facility DNR order (yellow form or pink MOST form)  -  "MOST" Form in Place?  -     Family Communication: Family and the bedside Disposition Plan: to be determined  Consultants:  Oncology  Urology  Vascular surgery  Antibiotics:  stopped  Time spent: 26 minutes  [place], [place]

## 2017-07-08 NOTE — Progress Notes (Signed)
Dr Leslye Peer made aware that CCMD called and reported 12 beat run of vtach, MD acknowledged, no new orders, MD to assess pt

## 2017-07-08 NOTE — Progress Notes (Signed)
Initial Nutrition Assessment  DOCUMENTATION CODES:   Severe malnutrition in context of acute illness/injury  INTERVENTION:  Provide Ensure Enlive po BID, each supplement provides 350 kcal and 20 grams of protein.  Encouraged adequate intake of calories and protein at meals.  NUTRITION DIAGNOSIS:   Malnutrition (Severe) related to acute illness (abdominal pain, N/V, locally advanced renal urothelial carcinoma) as evidenced by energy intake < or equal to 50% for > or equal to 5 days, 8.5 percent weight loss over 1 month, moderate depletion of body fat, mild depletion of muscle mass, moderate depletions of muscle mass.  GOAL:   Patient will meet greater than or equal to 90% of their needs  MONITOR:   PO intake, Supplement acceptance, Labs, Weight trends, I & O's  REASON FOR ASSESSMENT:   Malnutrition Screening Tool    ASSESSMENT:   76 year old male with PMHx of HTN, gastric ulcer, hypothyroidism, DDD, depression, HLD, hx CABG x 3 in 2003, anxiety, COPD, locally advanced right renal urothelial carcinoma (inoperable, s/p chemo/XRT) admitted with severe abdominal pain and nausea/vomiting.   Spoke with patient and his family members at bedside. Patient reports his appetite is improving now that his nausea and pain are under better control. He was able to have some bites of solid food today. Patient reports that for the 2-3 weeks prior to admission he was only having some applesauce and pudding each day at home. He was having intractable N/V with all foods and liquids. He typically eats 2-3 meals per day and enjoys drinking buttermilk.  UBW 184 lbs. No weight that high in chart. Per chart patient was 158.5 lbs on 06/01/2017. He has lost 13.5 lbs (8.5% body weight) over one month, which is significant for time frame.  Medications reviewed and include: levothyroxine, MVI daily, pantoprazole, Miralax, prednisone 10 mg daily, senna, NS @ 30 ml/hr.  Labs reviewed: Chloride 100, Creatinine  1.27.  Nutrition-Focused physical exam completed. Findings are moderate fat depletion (moderate depletion of orbital and upper arm region), mild-moderate muscle depletion (mild depletion of clavicle/acromion bone region and scapular bone region; moderate depletion of temple region and clavicle bone region), and no edema.   Discussed with RN.  Diet Order:  DIET SOFT Room service appropriate? Yes; Fluid consistency: Thin  Skin:  Reviewed, no issues  Last BM:  07/07/2017 - medium type 4  Height:   Ht Readings from Last 1 Encounters:  07/07/17 5\' 7"  (1.702 m)    Weight:   Wt Readings from Last 1 Encounters:  07/07/17 145 lb (65.8 kg)    Ideal Body Weight:  67.3 kg  BMI:  Body mass index is 22.71 kg/m.  Estimated Nutritional Needs:   Kcal:  9326-7124 (MSJ x 1.3-1.5)  Protein:  80-100 grams (1.2-1.5 grams)  Fluid:  1.6-1.9 L/day (25-30 ml/kg)  EDUCATION NEEDS:   No education needs identified at this time  Willey Blade, [place], [place], [place] Office: [phone] Pager: [phone] After Hours/Weekend Pager: [phone]

## 2017-07-08 NOTE — Discharge Instructions (Signed)
Indwelling Urinary Catheter Care, Adult  Take good care of your catheter to keep it working and to prevent problems.  How to wear your catheter  Attach your catheter to your leg with tape (adhesive tape) or a leg strap. Make sure it is not too tight. If you use tape, remove any bits of tape that are already on the catheter.  How to wear a drainage bag  You should have:   A large overnight bag.   A small leg bag.    Overnight Bag  You may wear the overnight bag at any time. Always keep the bag below the level of your bladder but off the floor. When you sleep, put a clean plastic bag in a wastebasket. Then hang the bag inside the wastebasket.  Leg Bag  Never wear the leg bag at night. Always wear the leg bag below your knee. Keep the leg bag secure with a leg strap or tape.  How to care for your skin   Clean the skin around the catheter at least once every day.   Shower every day. Do not take baths.   Put creams, lotions, or ointments on your genital area only as told by your doctor.   Do not use powders, sprays, or lotions on your genital area.  How to clean your catheter and your skin  1. Wash your hands with soap and water.  2. Wet a washcloth in warm water and gentle (mild) soap.  3. Use the washcloth to clean the skin where the catheter enters your body. Clean downward and wipe away from the catheter in small circles. Do not wipe toward the catheter.  4. Pat the area dry with a clean towel. Make sure to clean off all soap.  How to care for your drainage bags  Empty your drainage bag when it is ?- full or at least 2-3 times a day. Replace your drainage bag once a month or sooner if it starts to smell bad or look dirty. Do not clean your drainage bag unless told by your doctor.  Emptying a drainage bag    Supplies Needed   Rubbing alcohol.   Gauze pad or cotton ball.   Tape or a leg strap.    Steps  1. Wash your hands with soap and water.  2. Separate (detach) the bag from your leg.  3. Hold the bag over  the toilet or a clean container. Keep the bag below your hips and bladder. This stops pee (urine) from going back into the tube.  4. Open the pour spout at the bottom of the bag.  5. Empty the pee into the toilet or container. Do not let the pour spout touch any surface.  6. Put rubbing alcohol on a gauze pad or cotton ball.  7. Use the gauze pad or cotton ball to clean the pour spout.  8. Close the pour spout.  9. Attach the bag to your leg with tape or a leg strap.  10. Wash your hands.    Changing a drainage bag  Supplies Needed   Alcohol wipes.   A clean drainage bag.   Adhesive tape or a leg strap.    Steps  1. Wash your hands with soap and water.  2. Separate the dirty bag from your leg.  3. Pinch the rubber catheter with your fingers so that pee does not spill out.  4. Separate the catheter tube from the drainage tube where these tubes connect (at the   connection valve). Do not let the tubes touch any surface.  5. Clean the end of the catheter tube with an alcohol wipe. Use a different alcohol wipe to clean the end of the drainage tube.  6. Connect the catheter tube to the drainage tube of the clean bag.  7. Attach the new bag to the leg with adhesive tape or a leg strap.  8. Wash your hands.    How to prevent infection and other problems   Never pull on your catheter or try to remove it. Pulling can damage tissue in your body.   Always wash your hands before and after touching your catheter.   If a leg strap gets wet, replace it with a dry one.   Drink enough fluids to keep your pee clear or pale yellow, or as told by your doctor.   Do not let the drainage bag or tubing touch the floor.   Wear cotton underwear.   If you are male, wipe from front to back after you poop (have a bowel movement).   Check on the catheter often to make sure it works and the tubing is not twisted.  Get help if:   Your pee is cloudy.   Your pee smells unusually bad.   Your pee is not draining into the bag.   Your  tube gets clogged.   Your catheter starts to leak.   Your bladder feels full.  Get help right away if:   You have redness, swelling, or pain where the catheter enters your body.   You have fluid, pus, or a bad smell coming from the area where the catheter enters your body.   The area where the catheter enters your body feels warm.   You have a fever.   You have pain in your:  ? Stomach (abdomen).  ? Legs.  ? Lower back.  ? Bladder.   You see blood fill the catheter.   Your pee is pink or red.   You feel sick to your stomach (nauseous).   You throw up (vomit).   You have chills.   Your catheter gets pulled out.  This information is not intended to replace advice given to you by your health care provider. Make sure you discuss any questions you have with your health care provider.  Document Released: 01/06/2013 Document Revised: 08/09/2016 Document Reviewed: 02/24/2014  [employer] Interactive Patient Education  2018 [employer]

## 2017-07-08 NOTE — Progress Notes (Signed)
Subjective: Joseph Ritter is doing well post foley placement and had a 924ml residual.  His pain has largely resolved.  ROS:  Review of Systems  Constitutional: Negative for fever.  Gastrointestinal: Negative for abdominal pain.    Anti-infectives: Anti-infectives    Start     Dose/Rate Route Frequency Ordered Stop   07/07/17 0500  vancomycin (VANCOCIN) 1,250 mg in sodium chloride 0.9 % 250 mL IVPB  Status:  Discontinued     1,250 mg 166.7 mL/hr over 90 Minutes Intravenous Every 24 hours 07/06/17 2254 07/07/17 1650   07/07/17 0400  piperacillin-tazobactam (ZOSYN) IVPB 3.375 g     3.375 g 12.5 mL/hr over 240 Minutes Intravenous Every 8 hours 07/06/17 2254     07/06/17 1945  piperacillin-tazobactam (ZOSYN) IVPB 3.375 g     3.375 g 100 mL/hr over 30 Minutes Intravenous  Once 07/06/17 1938 07/06/17 2044   07/06/17 1945  vancomycin (VANCOCIN) IVPB 1000 mg/200 mL premix     1,000 mg 200 mL/hr over 60 Minutes Intravenous  Once 07/06/17 1938 07/06/17 2214      Current Facility-Administered Medications  Medication Dose Route Frequency Provider Last Rate Last Dose  . 0.9 %  sodium chloride infusion   Intravenous Continuous Hugelmeyer, Alexis, DO 75 mL/hr at 07/08/17 2130    . acetaminophen (TYLENOL) tablet 500 mg  500 mg Oral BID PRN Hugelmeyer, Alexis, DO      . albuterol (PROVENTIL) (2.5 MG/3ML) 0.083% nebulizer solution 2.5 mg  2.5 mg Nebulization Q6H PRN Hugelmeyer, Alexis, DO      . apixaban (ELIQUIS) tablet 2.5 mg  2.5 mg Oral BID Earlie Server, MD      . aspirin EC tablet 81 mg  81 mg Oral Daily Hugelmeyer, Alexis, DO   81 mg at 07/08/17 8657  . buPROPion [facility] SR) 12 hr tablet 100 mg  100 mg Oral Daily Hugelmeyer, Alexis, DO   100 mg at 07/08/17 0724  . cyclobenzaprine (FLEXERIL) tablet 10 mg  10 mg Oral QHS PRN Hugelmeyer, Alexis, DO      . simvastatin (ZOCOR) tablet 40 mg  40 mg Oral q1800 Hugelmeyer, Alexis, DO   40 mg at 07/07/17 1613   And  . ezetimibe (ZETIA) tablet 10 mg  10  mg Oral Daily Hugelmeyer, Alexis, DO   10 mg at 07/07/17 1615  . fentaNYL (DURAGESIC - dosed mcg/hr) patch 25 mcg  25 mcg Transdermal Q72H Loletha Grayer, MD   25 mcg at 07/07/17 1353  . guaiFENesin (MUCINEX) 12 hr tablet 600 mg  600 mg Oral BID PRN Hugelmeyer, Alexis, DO      . HYDROcodone-acetaminophen (NORCO) 7.5-325 MG per tablet 2 tablet  2 tablet Oral Q6H PRN Hugelmeyer, Alexis, DO   2 tablet at 07/07/17 1613  . hyoscyamine (ANASPAZ) disintergrating tablet 0.125 mg  0.125 mg Sublingual Q6H PRN Hugelmeyer, Alexis, DO      . Influenza vac split quadrivalent PF (FLUZONE HIGH-DOSE) injection 0.5 mL  0.5 mL Intramuscular Tomorrow-1000 Hugelmeyer, Alexis, DO      . ipratropium (ATROVENT) nebulizer solution 0.5 mg  0.5 mg Nebulization Q6H PRN Hugelmeyer, Alexis, DO      . levothyroxine (SYNTHROID, LEVOTHROID) tablet 125 mcg  125 mcg Oral QAC breakfast Loletha Grayer, MD   125 mcg at 07/08/17 8469  . LORazepam (ATIVAN) tablet 0.5 mg  0.5 mg Oral Q8H PRN Hugelmeyer, Alexis, DO      . morphine (MS CONTIN) 12 hr tablet 60 mg  60 mg Oral Q12H Hugelmeyer, Alexis, DO  60 mg at 07/08/17 0722  . morphine 2 MG/ML injection 2 mg  2 mg Intravenous Q3H PRN Loletha Grayer, MD   2 mg at 07/07/17 1401  . multivitamin with minerals tablet 1 tablet  1 tablet Oral QPC breakfast Hugelmeyer, Alexis, DO   1 tablet at 07/08/17 1191  . ondansetron (ZOFRAN) tablet 4 mg  4 mg Oral Q6H PRN Hugelmeyer, Alexis, DO       Or  . ondansetron (ZOFRAN) injection 4 mg  4 mg Intravenous Q6H PRN Hugelmeyer, Alexis, DO   4 mg at 07/08/17 0209  . pantoprazole (PROTONIX) EC tablet 40 mg  40 mg Oral Daily Hugelmeyer, Alexis, DO   40 mg at 07/08/17 4782  . piperacillin-tazobactam (ZOSYN) IVPB 3.375 g  3.375 g Intravenous Q8H Carrie Mew, MD   Stopped at 07/08/17 [phone]  . polyethylene glycol (MIRALAX / GLYCOLAX) packet 17 g  17 g Oral QPC breakfast Wieting, Richard, MD      . predniSONE (DELTASONE) tablet 10 mg  10 mg Oral Q  breakfast Hugelmeyer, Alexis, DO   10 mg at 07/08/17 1308  . prochlorperazine (COMPAZINE) tablet 10 mg  10 mg Oral Q6H PRN Hugelmeyer, Alexis, DO      . ramipril (ALTACE) capsule 10 mg  10 mg Oral QPC breakfast Hugelmeyer, Alexis, DO   10 mg at 07/08/17 0724  . senna (SENOKOT) tablet 17.2 mg  2 tablet Oral Daily Hugelmeyer, Alexis, DO   17.2 mg at 07/08/17 6578  . sertraline (ZOLOFT) tablet 100 mg  100 mg Oral QPC breakfast Hugelmeyer, Alexis, DO   100 mg at 07/08/17 4696  . sodium chloride flush (NS) 0.9 % injection 3 mL  3 mL Intravenous Q12H Hugelmeyer, Alexis, DO   3 mL at 07/08/17 0727  . sodium phosphate (FLEET) 7-19 GM/118ML enema 1 enema  1 enema Rectal Daily PRN Loletha Grayer, MD   1 enema at 07/07/17 0941  . tamsulosin (FLOMAX) capsule 0.8 mg  0.8 mg Oral QPC supper Hugelmeyer, Alexis, DO   0.8 mg at 07/07/17 1613   Facility-Administered Medications Ordered in Other Encounters  Medication Dose Route Frequency Provider Last Rate Last Dose  . heparin lock flush 100 unit/mL  500 Units Intravenous Once Earlie Server, MD      . sodium chloride flush (NS) 0.9 % injection 10 mL  10 mL Intravenous PRN Earlie Server, MD   10 mL at 07/03/17 0930     Objective: Vital signs in last 24 hours: Temp:  [97.8 F (36.6 C)-98.2 F (36.8 C)] 98.2 F (36.8 C) (10/14 0435) Pulse Rate:  [77-138] 77 (10/14 0435) Resp:  [18-21] 18 (10/14 0435) BP: (138-154)/(61-64) 154/61 (10/14 0435) SpO2:  [97 %] 97 % (10/14 0435)  Intake/Output from previous day: 10/13 0701 - 10/14 0700 In: 660 [P.O.:660] Out: 1150 [Urine:1050; Emesis/NG output:100] Intake/Output this shift: No intake/output data recorded.   Physical Exam  Constitutional: He is well-developed, well-nourished, and in no distress.  Vitals reviewed.   Lab Results:   Recent Labs  07/06/17 1938 07/08/17 0542  WBC 9.0 3.7*  HGB 12.5* 8.7*  HCT 36.9* 26.0*  PLT 92* 59*   BMET  Recent Labs  07/07/17 0739 07/08/17 0542  NA 139 139  K  3.9 3.5  CL 100* 100*  CO2 29 26  GLUCOSE 123* 80  BUN 15 16  CREATININE 1.27* 1.27*  CALCIUM 8.8* 8.3*   PT/INR  Recent Labs  07/06/17 1938  LABPROT 13.3  INR 1.02  ABG No results for input(s): PHART, HCO3 in the last 72 hours.  Invalid input(s): PCO2, PO2  Studies/Results: Ct Abdomen Pelvis W Contrast  Result Date: 07/06/2017 CLINICAL DATA:  Vomiting. 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 EXAM: CT ABDOMEN AND PELVIS WITH CONTRAST TECHNIQUE: Multidetector CT imaging of the abdomen and pelvis was performed using the standard protocol following bolus administration of intravenous contrast. CONTRAST:  57mL ISOVUE-300 IOPAMIDOL (ISOVUE-300) INJECTION 61% COMPARISON:  CT abdomen pelvis 05/03/2017 FINDINGS: Lower chest: No pulmonary nodules or pleural effusion. No visible pericardial effusion. Hepatobiliary: Normal hepatic contours and density. No visible biliary dilatation. Normal gallbladder. Pancreas: Normal contours without ductal dilatation. No peripancreatic fluid collection. Spleen: Normal. Adrenals/Urinary Tract: --Adrenal glands: Normal. --Right kidney/ureter: There is diffuse abnormality of the renal hilum with loss of the normal enhancement pattern of most of the renal parenchyma. This extends to the right-sided renal vessels and encases approximately 270 degrees of the aorta. There is also invasion of the inferior vena cava. Invasion into the adjacent vertebral body anterior wall has worsened. 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 There is periureteral stranding. --Left kidney/ureter: Mild left hydroureteronephrosis without a clear obstructing lesion. The left kidney itself is normal. The left renal vein's inflow into the IVC is likely occluded. It may be draining via lumbar veins. --Urinary bladder: Distended but otherwise unremarkable. Stomach/Bowel: --Stomach/Duodenum: No hiatal hernia or other  gastric abnormality. Normal duodenal course and caliber. --Small bowel: No dilatation or inflammation. --Colon: Moderate colonic stool volume. Mild sigmoid diverticulosis. --Appendix: Not visualized. No right lower quadrant inflammation or free fluid. Vascular/Lymphatic: There is invasion of the inferior vena cava, encasement of the aorta and obliteration of the right renal vessels, as described above. There is atherosclerotic calcification of the non aneurysmal abdominal aorta. No abdominal or pelvic lymphadenopathy. Reproductive: Normal prostate and seminal vesicles. Musculoskeletal. There is invasion of the L2 vertebral body by the above-described mass of the renal hilum. Prominent soft tissue along the ventral aspect of the lumbar spinal canal may indicate dilated venous plexus, as described on the prior MRI. Other: Small amount of fluid in the left inguinal canal. IMPRESSION: 1. Progression of mass at the right renal hilum, which now invades and occludes the right renal artery and vein and the inferior vena cava. The mass also encases approximately 270 degrees of the aorta and invades the anterior aspect of the L2 vertebral body. 2. No focal gastrointestinal abnormality. 3. Aortic atherosclerosis (ICD10-I70.0). Electronically Signed   By: Ulyses Jarred M.D.   On: 07/06/2017 22:24   Dg Chest Port 1 View  Result Date: 07/06/2017 CLINICAL DATA:  Vomiting today. The patient is undergoing chemotherapy for urothelial carcinoma. EXAM: PORTABLE CHEST 1 VIEW COMPARISON:  Single-view of the chest 03/23/2017. CT chest, abdomen and pelvis 05/03/2017. FINDINGS: Port-A-Cath is in place. The patient is status post CABG. Lungs are clear. Heart size is normal. No pneumothorax or pleural effusion. No acute bony abnormality. IMPRESSION: No acute disease. Electronically Signed   By: Inge Rise M.D.   On: 07/06/2017 20:18     Assessment and Plan: Urinary retention from neurogenic bladder and difficulty with CIC.    He is doing well with the foley and has had resolution of the abdominal pain.  Metastatic urothelial carcinoma.   Management per Oncology.   I will sign off.    He will need f/u in 1 month in the office for catheter exchange.   Message sent  to set up appt.       LOS: 1 day    Malka So 07/08/2017 [phone] ID: Joseph Ritter, male   DOB: January 01, 1941, 76 y.o.   MRN: [id number]

## 2017-07-09 ENCOUNTER — Inpatient Hospital Stay: Payer: Medicare Other

## 2017-07-09 ENCOUNTER — Ambulatory Visit: Payer: Medicare Other | Admitting: Radiation Oncology

## 2017-07-09 ENCOUNTER — Inpatient Hospital Stay: Payer: Medicare Other | Admitting: Oncology

## 2017-07-09 ENCOUNTER — Telehealth: Payer: Self-pay | Admitting: Urology

## 2017-07-09 LAB — URINE CULTURE

## 2017-07-09 MED ORDER — DOCUSATE SODIUM 100 MG PO CAPS
100.0000 mg | ORAL_CAPSULE | Freq: Two times a day (BID) | ORAL | Status: DC
  Administered 2017-07-09 – 2017-07-10 (×3): 100 mg via ORAL
  Filled 2017-07-09 (×3): qty 1

## 2017-07-09 MED ORDER — PROMETHAZINE HCL 25 MG/ML IJ SOLN
12.5000 mg | Freq: Once | INTRAMUSCULAR | Status: AC
  Administered 2017-07-09: 12.5 mg via INTRAVENOUS
  Filled 2017-07-09: qty 1

## 2017-07-09 NOTE — Progress Notes (Addendum)
Please note patient is currently being followed by out patient Palliative NP. [place] aware. Notes faxed to out patient Palliative team. Thank you. Flo Shanks RN, BSN, [place] [facility] liaison [phone]

## 2017-07-09 NOTE — Progress Notes (Signed)
Hematology/Oncology Progress Note [facility] [phone] [phone]  Patient Care Team: [name], MD as PCP - General (Physician Assistant)   Name of the patient: Joseph Ritter  [id number]  08/24/1941  Date of visit: 07/09/17  History of presenting illness-  Pertinent ONCOLOGY HISTORY Joseph Ritter y.o.malewho developed gross hematuria at the end of 2017. CT 08/2016 showed endophytic hypodense mass. Repeat image 01/2017 showed enlarged endophytic hypodense mass right kidney. 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 Right robotic nephroureterectomy was attempted but aborted due to unresectable newly metastatic disease diagnosed intraoperatively. Postop he underwent a CT scan of the abdomen and pelvis which showed extension of an infiltrative mass from the right kidney along the right renal vein toward the IVC with encasement of the IVC.  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 He self catheterize a few time a day.  He can walk with walker for very short distance, mostly limited by back pain and fatigue./weakness. #Pathology: 8/8/2018SPECIMEN SUBMITTED: A. Hilar tissue, right kidney B. Hilar tissue, right kidney DIAGNOSIS:  A and B. SOFT TISSUE, RIGHT RENAL HILUM  PD-L1 - Urothelial (TECENTRIQ) Immunohistochemistry Analysis  Result: 0% Interpretation: No expression  INTERVAL HISTORY- patient reports feeling much better today. Pain is better on fentanyl patch. Eliquis was recommended due to tumor thrombus and patient declined.    Review of systems- ROS Constitutional: Positive for malaise/fatigue. Negative for chills and fever.  HENT: Negative.   Eyes: Negative.   Respiratory: Negative.   Cardiovascular: Negative.   Gastrointestinal:abdominal pain is better Genitourinary: Positive for flank pain.  Skin: Negative.   Neurological:  Positive weakness. . Negative for dizziness and headaches.  Endo/Heme/Allergies: Negative.   Psychiatric/Behavioral: The patient is nervous/anxious.   No Known Allergies  Patient Active Problem List   Diagnosis Date Noted  . Sepsis secondary to UTI ([place]) 07/07/2017  . Malignant neoplasm of right kidney ([place])   . Goals of care, counseling/discussion 05/25/2017  . 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 CKD (chronic kidney disease) stage 3, GFR 30-59 ml/min (HCC) 05/08/2017  . Transitional cell carcinoma of kidney, right ([place]) 05/02/2017  . Right renal mass 02/27/2017  . Gross hematuria 08/15/2016  . Dysuria 08/15/2016  . Acute cystitis with hematuria 08/15/2016  . CAFL (chronic airflow limitation) ([place]) 05/03/2015  . Clinical depression 05/03/2015  . 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 DDD (degenerative disc disease), lumbar 06/29/2014  . Neuritis or radiculitis due to rupture of lumbar intervertebral disc 06/29/2014  . 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 Borderline diabetes 05/25/2014  . Blood glucose elevated 05/25/2014  . Acid indigestion 03/09/2014  . Gastric ulcer 03/09/2014  . CCF (congestive cardiac failure) ([place]) 01/03/2014  . HLD (hyperlipidemia) 01/03/2014  . 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 Subacute transverse myelitis ([place]) 11/21/2012  . 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 Arteriosclerosis of coronary artery 11/18/2012  . BP (high blood pressure) 11/18/2012  . Adult hypothyroidism 11/18/2012  . Incomplete bladder emptying 11/18/2012  . Leg weakness 11/18/2012  . H/O coronary artery bypass surgery 04/07/2002     Past Medical History:  Diagnosis Date  . Acid indigestion 03/09/2014  . Adult hypothyroidism 11/18/2012  . Anxiety   . Arteriosclerosis of coronary artery 11/18/2012   Overview:  PATENT LIMA TO LAD, PATENT SVG TO PDA AND OCCULUDED SVG TO OM2 BY CATHERIZATION 02/09/2009   . 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 Borderline diabetes 05/25/2014  . BP (high blood pressure) 11/18/2012  . BPH (benign prostatic hypertrophy)   . CAFL (chronic airflow limitation) ([place]) 05/03/2015  . Cancer ([place])    renal cancer with mets  . CCF (congestive cardiac failure) ([place]) 01/03/2014   Overview:  HX OF   . Clinical depression 05/03/2015  . COPD (chronic obstructive pulmonary disease) ([place])   . DDD (degenerative disc disease), lumbar 06/29/2014  . 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 Gastric ulcer 03/09/2014  . 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 History of urinary self-catheterization 2017  . HLD (hyperlipidemia) 01/03/2014  . Incomplete bladder emptying 11/18/2012  . Leg weakness 11/18/2012  . Lumbar radiculitis   . Neuritis or radiculitis due to rupture of lumbar intervertebral disc 06/29/2014  . Overactive bladder   . PONV (postoperative nausea and vomiting)    years ago with Ether, no problem with Nausea or vomiting with the last few surgerys  . Pre-diabetes   . Subacute transverse myelitis ([place]) 11/21/2012  . Teeth problem    pt reports "bad teeth", "need to be pulled"     Past Surgical History:  Procedure Laterality Date  . ARTERY BIOPSY Right 12/16/2015   Procedure: BIOPSY TEMPORAL ARTERY;  Surgeon: Algernon Huxley, MD;  Location: [facility];  Service: Vascular;  Laterality: Right;  . CARDIAC CATHETERIZATION    . CATARACT EXTRACTION W/PHACO Left 01/26/2016   Procedure: CATARACT EXTRACTION PHACO AND INTRAOCULAR LENS PLACEMENT (IOC) LEFT EYE;  Surgeon: Leandrew Koyanagi, MD;  Location: [place];  Service: Ophthalmology;  Laterality: Left;  . CORONARY ARTERY BYPASS GRAFT  04/07/2002   [facility]  . Cysto Bladder Botox Injection  07/02/2014  . CYSTOSCOPY W/ RETROGRADES Right 03/16/2017   Procedure: CYSTOSCOPY WITH RETROGRADE PYELOGRAM;  Surgeon: [name], MD;   Location: [facility];  Service: Urology;  Laterality: Right;  . CYSTOSCOPY WITH STENT PLACEMENT Right 03/16/2017   Procedure: CYSTOSCOPY WITH STENT PLACEMENT;  Surgeon: [name], MD;  Location: [facility];  Service: Urology;  Laterality: Right;  . EYE SURGERY Left    blood clot behind left eye  . HAND SURGERY Bilateral   . HERNIA REPAIR Right    inguinal hernia repair  . LEFT HEART CATH AND CORS/GRAFTS ANGIOGRAPHY N/A 04/25/2017   Procedure: Left Heart Cath and Cors/Grafts Angiography;  Surgeon: Isaias Cowman, MD;  Location: [place] [facility];  Service: Cardiovascular;  Laterality: N/A;  . PORTA CATH INSERTION N/A 05/30/2017   Procedure: Glori Luis Cath Insertion;  Surgeon: Katha Cabal, MD;  Location: [place] [facility];  Service: Cardiovascular;  Laterality: N/A;  . ROBOT ASSITED LAPAROSCOPIC NEPHROURETERECTOMY Right 05/02/2017   Procedure: ROBOT ASSITED LAPAROSCOPIC NEPHROURETERECTOMY ATTEMPTED;  Surgeon: [name], MD;  Location: [facility];  Service: Urology;  Laterality: Right;  . URETEROSCOPY Right 03/16/2017   Procedure: URETEROSCOPY BIOPSY RENAL MASS;  Surgeon: [name], MD;  Location: [facility];  Service: Urology;  Laterality: Right;    Social History   Social History  . Marital status: Married    Spouse name: N/A  . Number of children: N/A  . Years of education: N/A   Occupational History  . Not on file.   Social History Main Topics  . Smoking status: Former Smoker    Types: Cigarettes    Quit date: 04/07/2002  . Smokeless  tobacco: Former Systems developer    Types: Snuff, Chew  . Alcohol use No  . Drug use: No  . Sexual activity: Not on file   Other Topics Concern  . Not on file   Social History Narrative  . No narrative on file     Family History  Problem Relation Age of Onset  . Heart attack Mother   . Heart disease Father   . Prostate cancer Neg Hx   . Bladder Cancer Neg Hx   . Kidney cancer Neg Hx      Current  Facility-Administered Medications:  .  0.9 %  sodium chloride infusion, , Intravenous, Continuous, Wieting, Richard, MD, Last Rate: 30 mL/hr at 07/09/17 1007 .  acetaminophen (TYLENOL) tablet 500 mg, 500 mg, Oral, BID PRN, Hugelmeyer, Alexis, DO .  albuterol (PROVENTIL) (2.5 MG/3ML) 0.083% nebulizer solution 2.5 mg, 2.5 mg, Nebulization, Q6H PRN, Hugelmeyer, Alexis, DO .  aspirin EC tablet 81 mg, 81 mg, Oral, Daily, Hugelmeyer, Alexis, DO, 81 mg at 07/09/17 0959 .  buPROPion [facility] SR) 12 hr tablet 100 mg, 100 mg, Oral, Daily, Hugelmeyer, Alexis, DO, 100 mg at 07/09/17 0959 .  cyclobenzaprine (FLEXERIL) tablet 10 mg, 10 mg, Oral, QHS PRN, Hugelmeyer, Alexis, DO .  docusate sodium (COLACE) capsule 100 mg, 100 mg, Oral, BID, Wieting, Richard, MD .  simvastatin (ZOCOR) tablet 40 mg, 40 mg, Oral, q1800, 40 mg at 07/08/17 1707 **AND** ezetimibe (ZETIA) tablet 10 mg, 10 mg, Oral, Daily, Hugelmeyer, Alexis, DO, 10 mg at 07/08/17 1708 .  feeding supplement (ENSURE ENLIVE) (ENSURE ENLIVE) liquid 237 mL, 237 mL, Oral, BID BM, Wieting, Richard, MD, 237 mL at 07/09/17 1430 .  fentaNYL (DURAGESIC - dosed mcg/hr) patch 25 mcg, 25 mcg, Transdermal, Q72H, Loletha Grayer, MD, 25 mcg at 07/07/17 1353 .  guaiFENesin (MUCINEX) 12 hr tablet 600 mg, 600 mg, Oral, BID PRN, Hugelmeyer, Alexis, DO .  HYDROcodone-acetaminophen (NORCO) 7.5-325 MG per tablet 2 tablet, 2 tablet, Oral, Q6H PRN, Hugelmeyer, Alexis, DO, 2 tablet at 07/07/17 1613 .  hyoscyamine (ANASPAZ) disintergrating tablet 0.125 mg, 0.125 mg, Sublingual, Q6H PRN, Hugelmeyer, Alexis, DO .  Influenza vac split quadrivalent PF (FLUZONE HIGH-DOSE) injection 0.5 mL, 0.5 mL, Intramuscular, Tomorrow-1000, Hugelmeyer, Alexis, DO .  ipratropium (ATROVENT) nebulizer solution 0.5 mg, 0.5 mg, Nebulization, Q6H PRN, Hugelmeyer, Alexis, DO .  levothyroxine (SYNTHROID, LEVOTHROID) tablet 125 mcg, 125 mcg, Oral, QAC breakfast, Loletha Grayer, MD, 125 mcg at 07/09/17  0959 .  LORazepam (ATIVAN) tablet 0.5 mg, 0.5 mg, Oral, Q8H PRN, Hugelmeyer, Alexis, DO .  morphine (MS CONTIN) 12 hr tablet 60 mg, 60 mg, Oral, Q12H, Hugelmeyer, Alexis, DO, 60 mg at 07/09/17 0959 .  morphine 2 MG/ML injection 2 mg, 2 mg, Intravenous, Q3H PRN, Loletha Grayer, MD, 2 mg at 07/07/17 1401 .  multivitamin with minerals tablet 1 tablet, 1 tablet, Oral, QPC breakfast, Hugelmeyer, Alexis, DO, 1 tablet at 07/09/17 0959 .  ondansetron (ZOFRAN) tablet 4 mg, 4 mg, Oral, Q6H PRN **OR** ondansetron (ZOFRAN) injection 4 mg, 4 mg, Intravenous, Q6H PRN, Hugelmeyer, Alexis, DO, 4 mg at 07/09/17 1007 .  pantoprazole (PROTONIX) EC tablet 40 mg, 40 mg, Oral, Daily, Hugelmeyer, Alexis, DO, 40 mg at 07/09/17 0959 .  polyethylene glycol (MIRALAX / GLYCOLAX) packet 17 g, 17 g, Oral, QPC breakfast, Loletha Grayer, MD, 17 g at 07/09/17 0959 .  predniSONE (DELTASONE) tablet 10 mg, 10 mg, Oral, Q breakfast, Hugelmeyer, Alexis, DO, 10 mg at 07/09/17 0959 .  prochlorperazine (COMPAZINE) tablet 10 mg,  10 mg, Oral, Q6H PRN, Hugelmeyer, Alexis, DO .  ramipril (ALTACE) capsule 10 mg, 10 mg, Oral, QPC breakfast, Hugelmeyer, Alexis, DO, 10 mg at 07/09/17 0959 .  senna (SENOKOT) tablet 17.2 mg, 2 tablet, Oral, Daily, Hugelmeyer, Alexis, DO, 17.2 mg at 07/09/17 0959 .  sertraline (ZOLOFT) tablet 100 mg, 100 mg, Oral, QPC breakfast, Hugelmeyer, Alexis, DO, 100 mg at 07/09/17 0959 .  sodium chloride flush (NS) 0.9 % injection 3 mL, 3 mL, Intravenous, Q12H, Hugelmeyer, Alexis, DO, 3 mL at 07/09/17 1000 .  sodium phosphate (FLEET) 7-19 GM/118ML enema 1 enema, 1 enema, Rectal, Daily PRN, Loletha Grayer, MD, 1 enema at 07/07/17 0941 .  tamsulosin (FLOMAX) capsule 0.8 mg, 0.8 mg, Oral, QPC supper, Hugelmeyer, Alexis, DO, 0.8 mg at 07/08/17 1708  Facility-Administered Medications Ordered in Other Encounters:  .  heparin lock flush 100 unit/mL, 500 Units, Intravenous, Once, Earlie Server, MD .  sodium chloride flush (NS) 0.9  % injection 10 mL, 10 mL, Intravenous, PRN, Earlie Server, MD, 10 mL at 07/03/17 0930   Physical exam:  Vitals:   07/08/17 1951 07/09/17 0447 07/09/17 0700 07/09/17 1255  BP: 134/63 (!) 146/71 (!) 154/74 (!) 158/79  Pulse: (!) 57 (!) 52 (!) 55 67  Resp: 15 16 16    Temp: 97.9 F (36.6 C) 98.2 F (36.8 C) 98 F (36.7 C) 98.7 F (37.1 C)  TempSrc: Oral Oral Oral Oral  SpO2: 98% 99% 97% 96%  Weight:      Height:       Physical Exam GENERAL:not indistress, Sitting in wheelchair. SKIN: No rashes or significant lesions  HEAD: Normocephalic, No masses, lesions, tenderness or abnormalities  EYES: Conjunctiva are pale, non icteric ENT: External ears normal ,lips, buccal mucosa, and tongue normal and mucous membranes are moist  LYMPH: No palpable cervical and axillary lymphadenopathy  LUNGS: Clear to auscultation, no crackles or wheezes HEART: Regular rate &rhythm, no murmurs, no gallops, S1 normal and S2 normal  ABDOMEN: Abdomenis distended, withnormal bowel sounds MUSCULOSKELETAL: lumbar spine tenderness.  EXTREMITIES: No edema, no skin discoloration or tenderness NEURO: Alert &oriented, lower extremity strength 3/5 bilaterllay. Normal upper extremity     CMP Latest Ref Rng & Units 07/08/2017  Glucose 65 - 99 mg/dL 80  BUN 6 - 20 mg/dL 16  Creatinine 0.61 - 1.24 mg/dL 1.27(H)  Sodium 135 - 145 mmol/L 139  Potassium 3.5 - 5.1 mmol/L 3.5  Chloride 101 - 111 mmol/L 100(L)  CO2 22 - 32 mmol/L 26  Calcium 8.9 - 10.3 mg/dL 8.3(L)  Total Protein 6.5 - 8.1 g/dL -  Total Bilirubin 0.3 - 1.2 mg/dL -  Alkaline Phos 38 - 126 U/L -  AST 15 - 41 U/L -  ALT 17 - 63 U/L -   CBC Latest Ref Rng & Units 07/08/2017  WBC 3.8 - 10.6 K/uL 3.7(L)  Hemoglobin 13.0 - 18.0 g/dL 8.7(L)  Hematocrit 40.0 - 52.0 % 26.0(L)  Platelets 150 - 440 K/uL 59(L)    @IMAGES @  Mr Jeri Cos Wo Contrast  Result Date: 06/16/2017 CLINICAL DATA:  Intractable vomiting and nausea.  Bladder cancer EXAM: MRI HEAD  WITHOUT AND WITH CONTRAST TECHNIQUE: Multiplanar, multiecho pulse sequences of the brain and surrounding structures were obtained without and with intravenous contrast. CONTRAST:  44m MULTIHANCE GADOBENATE DIMEGLUMINE 529 MG/ML IV SOLN COMPARISON:  None. FINDINGS: Brain: Mild generalized atrophy. Negative for acute infarct. Mild chronic microvascular ischemic change in the white matter. Brainstem and cerebellum normal. Negative for hemorrhage or mass.  Normal enhancement postcontrast infusion. Vascular: Normal arterial flow voids Skull and upper cervical spine: Negative Sinuses/Orbits: Negative Other: None IMPRESSION: Atrophy and mild chronic microvascular ischemia. No acute abnormality. Negative for metastatic disease. Electronically Signed   By: Franchot Gallo M.D.   On: 06/16/2017 16:00   Ct Abdomen Pelvis W Contrast  Result Date: 07/06/2017 CLINICAL DATA:  Vomiting. 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 EXAM: CT ABDOMEN AND PELVIS WITH CONTRAST TECHNIQUE: Multidetector CT imaging of the abdomen and pelvis was performed using the standard protocol following bolus administration of intravenous contrast. CONTRAST:  47m ISOVUE-300 IOPAMIDOL (ISOVUE-300) INJECTION 61% COMPARISON:  CT abdomen pelvis 05/03/2017 FINDINGS: Lower chest: No pulmonary nodules or pleural effusion. No visible pericardial effusion. Hepatobiliary: Normal hepatic contours and density. No visible biliary dilatation. Normal gallbladder. Pancreas: Normal contours without ductal dilatation. No peripancreatic fluid collection. Spleen: Normal. Adrenals/Urinary Tract: --Adrenal glands: Normal. --Right kidney/ureter: There is diffuse abnormality of the renal hilum with loss of the normal enhancement pattern of most of the renal parenchyma. This extends to the right-sided renal vessels and encases approximately 270 degrees of the aorta. There is also invasion of the inferior vena  cava. Invasion into the adjacent vertebral body anterior wall has worsened. 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 There is periureteral stranding. --Left kidney/ureter: Mild left hydroureteronephrosis without a clear obstructing lesion. The left kidney itself is normal. The left renal vein's inflow into the IVC is likely occluded. It may be draining via lumbar veins. --Urinary bladder: Distended but otherwise unremarkable. Stomach/Bowel: --Stomach/Duodenum: No hiatal hernia or other gastric abnormality. Normal duodenal course and caliber. --Small bowel: No dilatation or inflammation. --Colon: Moderate colonic stool volume. Mild sigmoid diverticulosis. --Appendix: Not visualized. No right lower quadrant inflammation or free fluid. Vascular/Lymphatic: There is invasion of the inferior vena cava, encasement of the aorta and obliteration of the right renal vessels, as described above. There is atherosclerotic calcification of the non aneurysmal abdominal aorta. No abdominal or pelvic lymphadenopathy. Reproductive: Normal prostate and seminal vesicles. Musculoskeletal. There is invasion of the L2 vertebral body by the above-described mass of the renal hilum. Prominent soft tissue along the ventral aspect of the lumbar spinal canal may indicate dilated venous plexus, as described on the prior MRI. Other: Small amount of fluid in the left inguinal canal. IMPRESSION: 1. Progression of mass at the right renal hilum, which now invades and occludes the right renal artery and vein and the inferior vena cava. The mass also encases approximately 270 degrees of the aorta and invades the anterior aspect of the L2 vertebral body. 2. No focal gastrointestinal abnormality. 3. Aortic atherosclerosis (ICD10-I70.0). Electronically Signed   By: KUlyses JarredM.D.   On: 07/06/2017 22:24   Dg Chest Port 1 View  Result Date: 07/06/2017 CLINICAL DATA:  Vomiting today. The patient is undergoing chemotherapy for urothelial  carcinoma. EXAM: PORTABLE CHEST 1 VIEW COMPARISON:  Single-view of the chest 03/23/2017. CT chest, abdomen and pelvis 05/03/2017. FINDINGS: Port-A-Cath is in place. The patient is status post CABG. Lungs are clear. Heart size is normal. No pneumothorax or pleural effusion. No acute bony abnormality. IMPRESSION: No acute disease. Electronically Signed   By: TInge RiseM.D.   On: 07/06/2017 20:18    Assessment and plan-  Patient is a 76y.o. male who is currently being treated for locally advance transitional cell carcinoma of right kidney with Gemcitabine and Carboplatin presented with abdominal pain, nausea/vomiting, urinary retention  due to inability to self catheterize.  1 Locally advanced right renal urothelial carcinoma, inoperable, currently on systemic chemotherapy, now with CT evidence of disease progression, and functional declining. Tumor is progressively encasing renal vein and aorta and Inferior vena cava,  Again I have a lengthy discussion with patient and his wife regarding the disease progression, chemoresistance. The next line of treatment is immunotherapy. Pembrolizumab in approved in this setting regardless of PD-L1 expression.  however his PD- L1 expression is 0%, immunotherapy response rate may be low. Immunotherapy also takes a long time to work, before the treatment being effective his pain and other symptoms may get worse and his functional status may further deteriorate. If he wants to continue treatment, I will see him outpatient after he is discharged and discuss immunotherapy.  Patient understands and want to think it over.   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 Vascular surgery has evaluated patient and there is no plan for surgical intervention. Recommend starting anticoagulation with Eliquis 2.41m BID. He declined. After I explained to him about why he needs to be on anticoagulation, he now understands the rationale. If he decides hospice  route, he can be off anticoagulation. If he decides to continue treatment, I highly recommend he starts taking Eliquis as his thrombosis risk overweighs bleeding risk. Patient would like to think it over.    Dr. ZEarlie Server MD, PhD CBetsy Johnson Hospitalat AHighline Medical CenterPager- [phone]/15/2018

## 2017-07-09 NOTE — Care Management Important Message (Signed)
Important Message  Patient Details  Name: HAJIME ASFAW MRN: [id number] Date of Birth: Feb 04, 1941   Medicare Important Message Given:  Yes    Shelbie Ammons, RN 07/09/2017, 8:14 AM

## 2017-07-09 NOTE — Care Management Note (Signed)
Case Management Note  Patient Details  Name: Joseph Ritter MRN: [id number] Date of Birth: Sep 29, 1940  Subjective/Objective: Admitted to [facility] with the diagnosis of sepsis (UTI). Lives with wife, Pamala Hurry [phone]). Last seen Dr. Carrie Mew July 19 th. Prescriptions are filled at [facility]. Home Health many years ago per [place] No skilled facility. No home oxygen. Rolling walker, bedside commode, Rol[place], cane, hospital bed, and old wheelchair in the home. Self feed, needs some help with baths and dressing. No falls. Lost 30 pounds in the last 6 months. Finished radiation last week. Don't believe they have finished 1st round of chemo. Palliative Care is in the home. Family will transport.                   Action/Plan: Physical therapy evaluation completed. Recommending Home Health and Physical therapy in the home. Would like [facility] again.    Expected Discharge Date:  07/09/17               Expected Discharge Plan:     In-House Referral:     Discharge planning Services     Post Acute Care Choice:   yes Choice offered to:   wife  DME Arranged:    DME Agency:     HH Arranged:   yes HH Agency:   [place]  Status of Service:     If discussed at [place] of Stay Meetings, dates discussed:    Additional Comments:  Shelbie Ammons, [place] Management [phone] 07/09/2017, 1:39 PM

## 2017-07-09 NOTE — Progress Notes (Signed)
Patient ID: Joseph Ritter, male   DOB: December 23, 1940, 76 y.o.   MRN: [id number]   [facility] PROGRESS NOTE  Joseph Ritter CHE:[id number] DOB: Dec 09, 1940 DOA: 07/06/2017 PCP: [name], MD  HPI/Subjective: Patient feeling better with regards to his pain. He vomited today. Mood is not good at this point.   Objective: Vitals:   07/09/17 0700 07/09/17 1255  BP: (!) 154/74 (!) 158/79  Pulse: (!) 55 67  Resp: 16   Temp: 98 F (36.7 C) 98.7 F (37.1 C)  SpO2: 97% 96%    Filed Weights   07/07/17 0200  Weight: 65.8 kg (145 lb)    ROS: Review of Systems  Constitutional: Negative for chills and fever.  Eyes: Negative for blurred vision.  Respiratory: Negative for cough and shortness of breath.   Cardiovascular: Negative for chest pain.  Gastrointestinal: Positive for constipation. Negative for abdominal pain, diarrhea, nausea and vomiting.  Genitourinary: Negative for dysuria.  Musculoskeletal: Negative for joint pain.  Neurological: Negative for dizziness and headaches.   Exam: Physical Exam  Constitutional: He is oriented to person, place, and time.  HENT:  Nose: No mucosal edema.  Mouth/Throat: No oropharyngeal exudate or posterior oropharyngeal edema.  Eyes: Pupils are equal, round, and reactive to light. Conjunctivae, EOM and lids are normal.  Neck: No JVD present. Carotid bruit is not present. No edema present. No thyroid mass and no thyromegaly present.  Cardiovascular: S1 normal and S2 normal.  Exam reveals no gallop.   No murmur heard. Pulses:      Dorsalis pedis pulses are 2+ on the right side, and 2+ on the left side.  Respiratory: No respiratory distress. He has no wheezes. He has no rhonchi. He has no rales.  GI: Soft. Bowel sounds are normal. There is no tenderness.  Musculoskeletal:       Right ankle: He exhibits no swelling.       Left ankle: He exhibits no swelling.  Lymphadenopathy:    He has no cervical adenopathy.  Neurological: He  is alert and oriented to person, place, and time. No cranial nerve deficit.  Skin: Skin is warm. No rash noted. Nails show no clubbing.  Psychiatric: He has a normal mood and affect.      Data Reviewed: Basic Metabolic Panel:  Recent Labs Lab 07/03/17 0932 07/03/17 1116 07/06/17 1938 07/07/17 0204 07/07/17 0739 07/08/17 0542  NA 134* 135 135  --  139 139  K 3.7 3.7 4.6  --  3.9 3.5  CL 96* 97* 94*  --  100* 100*  CO2 26 25 25   --  29 26  GLUCOSE 136* 148* 202*  --  123* 80  BUN 18 20 14   --  15 16  CREATININE 1.14 1.29* 1.64*  --  1.27* 1.27*  CALCIUM 8.9 9.1 9.6  --  8.8* 8.3*  MG  --   --   --  1.9  --   --   PHOS  --   --   --  4.0  --   --    Liver Function Tests:  Recent Labs Lab 07/03/17 0932 07/06/17 1938 07/07/17 0739  AST 31 45* 27  ALT 44 43 34  ALKPHOS 110 111 88  BILITOT 0.4 0.6 0.9  PROT 6.7 7.5 6.3*  ALBUMIN 3.3* 3.7 3.1*    Recent Labs Lab 07/06/17 1938  LIPASE 15   CBC:  Recent Labs Lab 07/03/17 0932 07/03/17 1116 07/06/17 1938 07/08/17 0542  WBC 3.1*  3.9 9.0 3.7*  NEUTROABS 2.1  --  7.8*  --   HGB 10.6* 11.5* 12.5* 8.7*  HCT 31.6* 34.0* 36.9* 26.0*  MCV 85.1 84.9 86.0 86.2  PLT 123* 119* 92* 59*   Cardiac Enzymes:  Recent Labs Lab 07/03/17 1357 07/06/17 1938 07/07/17 0204 07/07/17 0739 07/07/17 1412  TROPONINI <0.03 0.11* 0.07* 0.07* 0.05*     Recent Results (from the past 240 hour(s))  Blood Culture (routine x 2)     Status: None (Preliminary result)   Collection Time: 07/06/17  7:39 PM  Result Value Ref Range Status   Specimen Description BLOOD RIGHT ANTECUBITAL  Final   Special Requests   Final    BOTTLES DRAWN AEROBIC AND ANAEROBIC Blood Culture adequate volume   Culture NO GROWTH 3 DAYS  Final   Report Status PENDING  Incomplete  Blood Culture (routine x 2)     Status: None (Preliminary result)   Collection Time: 07/06/17  7:39 PM  Result Value Ref Range Status   Specimen Description BLOOD BLOOD RIGHT WRIST   Final   Special Requests   Final    BOTTLES DRAWN AEROBIC AND ANAEROBIC Blood Culture adequate volume   Culture NO GROWTH 3 DAYS  Final   Report Status PENDING  Incomplete  Urine culture     Status: Abnormal   Collection Time: 07/06/17 11:00 PM  Result Value Ref Range Status   Specimen Description URINE, RANDOM  Final   Special Requests NONE  Final   Culture (A)  Final    30,000 COLONIES/mL STAPHYLOCOCCUS SPECIES (COAGULASE NEGATIVE)   Report Status 07/09/2017 FINAL  Final   Organism ID, Bacteria STAPHYLOCOCCUS SPECIES (COAGULASE NEGATIVE) (A)  Final      Susceptibility   Staphylococcus species (coagulase negative) - MIC*    CIPROFLOXACIN >=8 RESISTANT Resistant     GENTAMICIN <=0.5 SENSITIVE Sensitive     NITROFURANTOIN <=16 SENSITIVE Sensitive     OXACILLIN >=4 RESISTANT Resistant     TETRACYCLINE >=16 RESISTANT Resistant     VANCOMYCIN 2 SENSITIVE Sensitive     TRIMETH/SULFA 160 RESISTANT Resistant     CLINDAMYCIN <=0.25 SENSITIVE Sensitive     RIFAMPIN <=0.5 SENSITIVE Sensitive     Inducible Clindamycin NEGATIVE Sensitive     * 30,000 COLONIES/mL STAPHYLOCOCCUS SPECIES (COAGULASE NEGATIVE)     Studies: No results found.  Scheduled Meds: . aspirin EC  81 mg Oral Daily  . buPROPion  100 mg Oral Daily  . docusate sodium  100 mg Oral BID  . simvastatin  40 mg Oral q1800   And  . ezetimibe  10 mg Oral Daily  . feeding supplement (ENSURE ENLIVE)  237 mL Oral BID BM  . fentaNYL  25 mcg Transdermal Q72H  . Influenza vac split quadrivalent PF  0.5 mL Intramuscular Tomorrow-1000  . levothyroxine  125 mcg Oral QAC breakfast  . morphine  60 mg Oral Q12H  . multivitamin with minerals  1 tablet Oral QPC breakfast  . pantoprazole  40 mg Oral Daily  . polyethylene glycol  17 g Oral QPC breakfast  . predniSONE  10 mg Oral Q breakfast  . ramipril  10 mg Oral QPC breakfast  . senna  2 tablet Oral Daily  . sertraline  100 mg Oral QPC breakfast  . sodium chloride flush  3 mL  Intravenous Q12H  . tamsulosin  0.8 mg Oral QPC supper   Continuous Infusions: . sodium chloride 30 mL/hr at 07/09/17 1007    Assessment/Plan:  1.  Severe abdominal pain.  Likely secondary to constipation and urinary retention. Pain relieved after Foley catheter placement and Fleet enema. I did prescribe fentanyl patch. Continue oral pain medications also.  2. 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 This is encroaching on the renal vein and artery and inferior vena cava. The patient absolutely does not want to be on blood thinner and does not want to take Eliquis. Overall prognosis is poor. Patient is a DO NOT RESUSCITATE. 3. Nausea and vomiting could be secondary to abdominal process. Advance diet to soft diet. Patient had vomiting this morning. Would like to see the patient eat a little more regularly prior to disposition 4. Sepsis ruled out. Antibiotics stopped. 5. Hypothyroidism unspecified on levothyroxine 6. Hyperlipidemia unspecified on Zocor and Zetia 7. BPH on Flomax 8. GERD on Protonix 9. Depression on Zoloft and Wellbutrin 10. Nonsustained ventricular tachycardia likely secondary to electrolyte abnormalities. 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 11. Drop in hemoglobin likely secondary to being dehydrated.  Code Status:     Code Status Orders        Start     Ordered   07/07/17 1345  Do not attempt resuscitation (DNR)  Continuous    Question Answer Comment  In the event of cardiac or respiratory ARREST Do not call a "code blue"   In the event of cardiac or respiratory ARREST Do not perform Intubation, CPR, defibrillation or ACLS   In the event of cardiac or respiratory ARREST Use medication by any route, position, wound care, and other measures to relive pain and suffering. May use oxygen, suction and manual treatment of airway obstruction as needed for comfort.   Comments nurse may  pronounce      07/07/17 1344    Code Status History    Date Active Date Inactive Code Status Order ID Comments User Context   07/07/2017  1:41 AM 07/07/2017  1:44 PM Full Code [id number]  Harvie Bridge, DO Inpatient   05/02/2017  5:04 PM 05/04/2017  6:51 PM Full Code [id number]  [name], MD Inpatient   04/25/2017  8:51 AM 04/25/2017  2:36 PM Full Code [id number]  Isaias Cowman, MD Inpatient    Advance Directive Documentation     Most Recent Value  Type of Advance Directive  Living will  Pre-existing out of facility DNR order (yellow form or pink MOST form)  -  "MOST" Form in Place?  -     Family Communication: Family and the bedside Disposition Plan: to be determined  Consultants:  Oncology  Urology  Vascular surgery  Antibiotics:  stopped  Time spent: 28 minutes  [place], [place]

## 2017-07-09 NOTE — Telephone Encounter (Signed)
-----   Message from Irine Seal, MD sent at 07/07/2017 12:11 PM EDT ----- This patient will need a f/u appt in about a month for a foley catheter exchange.

## 2017-07-09 NOTE — Telephone Encounter (Signed)
App made and mailed to patient ° °Joseph Ritter °

## 2017-07-10 DIAGNOSIS — R109 Unspecified abdominal pain: Secondary | ICD-10-CM

## 2017-07-10 DIAGNOSIS — J449 Chronic obstructive pulmonary disease, unspecified: Secondary | ICD-10-CM

## 2017-07-10 DIAGNOSIS — R531 Weakness: Secondary | ICD-10-CM

## 2017-07-10 DIAGNOSIS — R5383 Other fatigue: Secondary | ICD-10-CM

## 2017-07-10 DIAGNOSIS — Z9221 Personal history of antineoplastic chemotherapy: Secondary | ICD-10-CM

## 2017-07-10 DIAGNOSIS — N400 Enlarged prostate without lower urinary tract symptoms: Secondary | ICD-10-CM

## 2017-07-10 DIAGNOSIS — I2510 Atherosclerotic heart disease of native coronary artery without angina pectoris: Secondary | ICD-10-CM

## 2017-07-10 DIAGNOSIS — Z87891 Personal history of nicotine dependence: Secondary | ICD-10-CM

## 2017-07-10 DIAGNOSIS — M549 Dorsalgia, unspecified: Secondary | ICD-10-CM

## 2017-07-10 DIAGNOSIS — I823 Embolism and thrombosis of renal vein: Secondary | ICD-10-CM

## 2017-07-10 DIAGNOSIS — I82220 Acute embolism and thrombosis of inferior vena cava: Secondary | ICD-10-CM

## 2017-07-10 DIAGNOSIS — R5381 Other malaise: Secondary | ICD-10-CM

## 2017-07-10 DIAGNOSIS — E785 Hyperlipidemia, unspecified: Secondary | ICD-10-CM

## 2017-07-10 DIAGNOSIS — R338 Other retention of urine: Secondary | ICD-10-CM

## 2017-07-10 DIAGNOSIS — R443 Hallucinations, unspecified: Secondary | ICD-10-CM

## 2017-07-10 DIAGNOSIS — Z7952 Long term (current) use of systemic steroids: Secondary | ICD-10-CM

## 2017-07-10 DIAGNOSIS — Z79899 Other long term (current) drug therapy: Secondary | ICD-10-CM

## 2017-07-10 DIAGNOSIS — E039 Hypothyroidism, unspecified: Secondary | ICD-10-CM

## 2017-07-10 DIAGNOSIS — M5136 Other intervertebral disc degeneration, lumbar region: Secondary | ICD-10-CM

## 2017-07-10 DIAGNOSIS — F419 Anxiety disorder, unspecified: Secondary | ICD-10-CM

## 2017-07-10 LAB — BASIC METABOLIC PANEL
ANION GAP: 8 (ref 5–15)
BUN: 10 mg/dL (ref 6–20)
CHLORIDE: 98 mmol/L — AB (ref 101–111)
CO2: 27 mmol/L (ref 22–32)
Calcium: 8.7 mg/dL — ABNORMAL LOW (ref 8.9–10.3)
Creatinine, Ser: 0.96 mg/dL (ref 0.61–1.24)
GFR calc non Af Amer: >60 mL/min (ref >60)
Glucose, Bld: 80 mg/dL (ref 65–99)
POTASSIUM: 3.8 mmol/L (ref 3.5–5.1)
Sodium: 133 mmol/L — ABNORMAL LOW (ref 135–145)

## 2017-07-10 LAB — CBC
HEMATOCRIT: 27.1 % — AB (ref 40.0–52.0)
HEMOGLOBIN: 9 g/dL — AB (ref 13.0–18.0)
MCH: 28.4 pg (ref 26.0–34.0)
MCHC: 33.2 g/dL (ref 32.0–36.0)
MCV: 85.6 fL (ref 80.0–100.0)
Platelets: 76 10*3/uL — ABNORMAL LOW (ref 150–440)
RBC: 3.17 MIL/uL — ABNORMAL LOW (ref 4.40–5.90)
RDW: 17.5 % — ABNORMAL HIGH (ref 11.5–14.5)
WBC: 2.8 10*3/uL — ABNORMAL LOW (ref 3.8–10.6)

## 2017-07-10 LAB — MAGNESIUM: Magnesium: 1.6 mg/dL — ABNORMAL LOW (ref 1.7–2.4)

## 2017-07-10 MED ORDER — SENNA 8.6 MG PO TABS
1.0000 | ORAL_TABLET | Freq: Two times a day (BID) | ORAL | Status: DC
  Administered 2017-07-10: 8.6 mg via ORAL
  Filled 2017-07-10: qty 1

## 2017-07-10 MED ORDER — MAGNESIUM OXIDE 400 MG PO CAPS: 400.0000 mg | ORAL_CAPSULE | Freq: Every day | ORAL | 0 refills | Status: DC

## 2017-07-10 MED ORDER — LEVOTHYROXINE SODIUM 125 MCG PO TABS: 125.0000 ug | ORAL_TABLET | Freq: Every day | ORAL | 0 refills | Status: DC

## 2017-07-10 MED ORDER — PROMETHAZINE HCL 25 MG RE SUPP: 25.0000 mg | Freq: Four times a day (QID) | RECTAL | 0 refills | Status: DC | PRN

## 2017-07-10 MED ORDER — ENSURE ENLIVE PO LIQD: 237.0000 mL | Freq: Two times a day (BID) | ORAL | 0 refills | Status: AC

## 2017-07-10 MED ORDER — FLEET ENEMA 7-19 GM/118ML RE ENEM: 1.0000 | ENEMA | Freq: Every day | RECTAL | 0 refills | Status: DC | PRN

## 2017-07-10 MED ORDER — HEPARIN SOD (PORK) LOCK FLUSH 100 UNIT/ML IV SOLN
500.0000 [IU] | Freq: Once | INTRAVENOUS | Status: AC
  Administered 2017-07-10: 16:00:00 500 [IU] via INTRAVENOUS
  Filled 2017-07-10: qty 5

## 2017-07-10 MED ORDER — FENTANYL 25 MCG/HR TD PT72: 25.0000 ug | MEDICATED_PATCH | TRANSDERMAL | 0 refills | Status: DC

## 2017-07-10 MED ORDER — PROMETHAZINE HCL 25 MG PO TABS: 25.0000 mg | ORAL_TABLET | Freq: Four times a day (QID) | ORAL | 0 refills | Status: DC | PRN

## 2017-07-10 NOTE — Progress Notes (Signed)
Hematology/Oncology Progress Note [facility] [phone] [phone]  Patient Care Team: [name], MD as PCP - General (Physician Assistant)   Name of the patient: Joseph Kyllonen  [id number]  25-Oct-1940  Date of visit: 07/10/17  History of presenting illness-  Pertinent ONCOLOGY HISTORY CELVIN TANEY 76 y.o.malewho developed gross hematuria at the end of 2017. CT 08/2016 showed endophytic hypodense mass. Repeat image 01/2017 showed enlarged endophytic hypodense mass right kidney. 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 Right robotic nephroureterectomy was attempted but aborted due to unresectable newly metastatic disease diagnosed intraoperatively. Postop he underwent a CT scan of the abdomen and pelvis which showed extension of an infiltrative mass from the right kidney along the right renal vein toward the IVC with encasement of the IVC.  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 He self catheterize a few time a day.  He can walk with walker for very short distance, mostly limited by back pain and fatigue./weakness. #Pathology: 8/8/2018SPECIMEN SUBMITTED: A. Hilar tissue, right kidney B. Hilar tissue, right kidney DIAGNOSIS:  A and B. SOFT TISSUE, RIGHT RENAL HILUM  PD-L1 - Urothelial (TECENTRIQ) Immunohistochemistry Analysis  Result: 0% Interpretation: No expression  INTERVAL HISTORY- patient reports having nausea and vomiting episode last night. Today he is able to tolerate some oral liquids and food.. Pain is better on fentanyl patch. Eliquis was recommended due to tumor thrombus and patient declined.   Review of systems- Review of Systems  Constitutional: Positive for malaise/fatigue.  HENT: Negative.   Eyes: Negative for photophobia.  Respiratory: Negative for hemoptysis and shortness of breath.   Cardiovascular: Negative for chest  pain, palpitations and leg swelling.  Gastrointestinal: Positive for nausea and vomiting. Negative for diarrhea.  Genitourinary: Positive for flank pain.  Musculoskeletal: Positive for back pain.  Skin: Negative.   Neurological: Positive for weakness. Negative for dizziness and headaches.  Endo/Heme/Allergies: Does not bruise/bleed easily.  Psychiatric/Behavioral: Positive for hallucinations. Negative for suicidal ideas. The patient is nervous/anxious.    No Known Allergies  Patient Active Problem List   Diagnosis Date Noted  . Sepsis secondary to UTI ([place]) 07/07/2017  . Malignant neoplasm of right kidney ([place])   . Goals of care, counseling/discussion 05/25/2017  . 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 CKD (chronic kidney disease) stage 3, GFR 30-59 ml/min (HCC) 05/08/2017  . Transitional cell carcinoma of kidney, right ([place]) 05/02/2017  . Right renal mass 02/27/2017  . Gross hematuria 08/15/2016  . Dysuria 08/15/2016  . Acute cystitis with hematuria 08/15/2016  . CAFL (chronic airflow limitation) ([place]) 05/03/2015  . Clinical depression 05/03/2015  . 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 DDD (degenerative disc disease), lumbar 06/29/2014  . Neuritis or radiculitis due to rupture of lumbar intervertebral disc 06/29/2014  . 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 Borderline diabetes 05/25/2014  . Blood glucose elevated 05/25/2014  . Acid indigestion 03/09/2014  . Gastric ulcer 03/09/2014  . CCF (congestive cardiac failure) ([place]) 01/03/2014  . HLD (hyperlipidemia) 01/03/2014  . 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 Subacute transverse myelitis ([place]) 11/21/2012  . 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 Arteriosclerosis of coronary artery 11/18/2012  . BP (high blood pressure) 11/18/2012  . Adult hypothyroidism 11/18/2012  . Incomplete bladder emptying 11/18/2012  . Leg weakness 11/18/2012  . H/O coronary artery bypass surgery 04/07/2002     Past  Medical History:  Diagnosis Date  . Acid indigestion 03/09/2014  .  Adult hypothyroidism 11/18/2012  . Anxiety   . Arteriosclerosis of coronary artery 11/18/2012   Overview:  PATENT LIMA TO LAD, PATENT SVG TO PDA AND OCCULUDED SVG TO OM2 BY CATHERIZATION 02/09/2009   . 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 Borderline diabetes 05/25/2014  . BP (high blood pressure) 11/18/2012  . BPH (benign prostatic hypertrophy)   . CAFL (chronic airflow limitation) ([place]) 05/03/2015  . Cancer ([place])    renal cancer with mets  . CCF (congestive cardiac failure) ([place]) 01/03/2014   Overview:  HX OF   . Clinical depression 05/03/2015  . COPD (chronic obstructive pulmonary disease) ([place])   . DDD (degenerative disc disease), lumbar 06/29/2014  . 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 Gastric ulcer 03/09/2014  . 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 History of urinary self-catheterization 2017  . HLD (hyperlipidemia) 01/03/2014  . Incomplete bladder emptying 11/18/2012  . Leg weakness 11/18/2012  . Lumbar radiculitis   . Neuritis or radiculitis due to rupture of lumbar intervertebral disc 06/29/2014  . Overactive bladder   . PONV (postoperative nausea and vomiting)    years ago with Ether, no problem with Nausea or vomiting with the last few surgerys  . Pre-diabetes   . Subacute transverse myelitis ([place]) 11/21/2012  . Teeth problem    pt reports "bad teeth", "need to be pulled"     Past Surgical History:  Procedure Laterality Date  . ARTERY BIOPSY Right 12/16/2015   Procedure: BIOPSY TEMPORAL ARTERY;  Surgeon: Algernon Huxley, MD;  Location: [facility];  Service: Vascular;  Laterality: Right;  . CARDIAC CATHETERIZATION    . CATARACT EXTRACTION W/PHACO Left 01/26/2016   Procedure: CATARACT EXTRACTION PHACO AND INTRAOCULAR LENS PLACEMENT (IOC) LEFT EYE;  Surgeon: Leandrew Koyanagi, MD;   Location: [facility];  Service: Ophthalmology;  Laterality: Left;  . CORONARY ARTERY BYPASS GRAFT  04/07/2002   [facility]  . Cysto Bladder Botox Injection  07/02/2014  . CYSTOSCOPY W/ RETROGRADES Right 03/16/2017   Procedure: CYSTOSCOPY WITH RETROGRADE PYELOGRAM;  Surgeon: [name], MD;  Location: [facility];  Service: Urology;  Laterality: Right;  . CYSTOSCOPY WITH STENT PLACEMENT Right 03/16/2017   Procedure: CYSTOSCOPY WITH STENT PLACEMENT;  Surgeon: [name], MD;  Location: [facility];  Service: Urology;  Laterality: Right;  . EYE SURGERY Left    blood clot behind left eye  . HAND SURGERY Bilateral   . HERNIA REPAIR Right    inguinal hernia repair  . LEFT HEART CATH AND CORS/GRAFTS ANGIOGRAPHY N/A 04/25/2017   Procedure: Left Heart Cath and Cors/Grafts Angiography;  Surgeon: Isaias Cowman, MD;  Location: [facility];  Service: Cardiovascular;  Laterality: N/A;  . PORTA CATH INSERTION N/A 05/30/2017   Procedure: Glori Luis Cath Insertion;  Surgeon: Katha Cabal, MD;  Location: [facility] [facility];  Service: Cardiovascular;  Laterality: N/A;  . ROBOT ASSITED LAPAROSCOPIC NEPHROURETERECTOMY Right 05/02/2017   Procedure: ROBOT ASSITED LAPAROSCOPIC NEPHROURETERECTOMY ATTEMPTED;  Surgeon: [name], MD;  Location: [facility];  Service: Urology;  Laterality: Right;  . URETEROSCOPY Right 03/16/2017   Procedure: URETEROSCOPY BIOPSY RENAL MASS;  Surgeon: [name], MD;  Location: [facility];  Service: Urology;  Laterality: Right;    Social History   Social History  . Marital status: Married    Spouse name: N/A  . Number  of children: N/A  . Years of education: N/A   Occupational History  . Not on file.   Social History Main Topics  . Smoking status: Former Smoker    Types: Cigarettes    Quit date: 04/07/2002  . Smokeless tobacco: Former Systems developer    Types: Snuff, Chew  . Alcohol use No  . Drug use: No  . Sexual activity: Not on file   Other  Topics Concern  . Not on file   Social History Narrative  . No narrative on file     Family History  Problem Relation Age of Onset  . Heart attack Mother   . Heart disease Father   . Prostate cancer Neg Hx   . Bladder Cancer Neg Hx   . Kidney cancer Neg Hx      Current Facility-Administered Medications:  .  0.9 %  sodium chloride infusion, , Intravenous, Continuous, Wieting, Richard, MD, Last Rate: 30 mL/hr at 07/09/17 1007 .  acetaminophen (TYLENOL) tablet 500 mg, 500 mg, Oral, BID PRN, Hugelmeyer, Alexis, DO .  albuterol (PROVENTIL) (2.5 MG/3ML) 0.083% nebulizer solution 2.5 mg, 2.5 mg, Nebulization, Q6H PRN, Hugelmeyer, Alexis, DO .  aspirin EC tablet 81 mg, 81 mg, Oral, Daily, Hugelmeyer, Alexis, DO, 81 mg at 07/10/17 6967 .  buPROPion [facility] SR) 12 hr tablet 100 mg, 100 mg, Oral, Daily, Hugelmeyer, Alexis, DO, 100 mg at 07/10/17 1403 .  cyclobenzaprine (FLEXERIL) tablet 10 mg, 10 mg, Oral, QHS PRN, Hugelmeyer, Alexis, DO .  docusate sodium (COLACE) capsule 100 mg, 100 mg, Oral, BID, Leslye Peer, Richard, MD, 100 mg at 07/10/17 8938 .  simvastatin (ZOCOR) tablet 40 mg, 40 mg, Oral, q1800, 40 mg at 07/09/17 1740 **AND** ezetimibe (ZETIA) tablet 10 mg, 10 mg, Oral, Daily, Hugelmeyer, Alexis, DO, 10 mg at 07/09/17 1741 .  feeding supplement (ENSURE ENLIVE) (ENSURE ENLIVE) liquid 237 mL, 237 mL, Oral, BID BM, Leslye Peer, Richard, MD, 237 mL at 07/10/17 1403 .  fentaNYL (DURAGESIC - dosed mcg/hr) patch 25 mcg, 25 mcg, Transdermal, Q72H, Wieting, Richard, MD, 25 mcg at 07/10/17 1405 .  guaiFENesin (MUCINEX) 12 hr tablet 600 mg, 600 mg, Oral, BID PRN, Hugelmeyer, Alexis, DO .  HYDROcodone-acetaminophen (NORCO) 7.5-325 MG per tablet 2 tablet, 2 tablet, Oral, Q6H PRN, Hugelmeyer, Alexis, DO, 2 tablet at 07/07/17 1613 .  hyoscyamine (ANASPAZ) disintergrating tablet 0.125 mg, 0.125 mg, Sublingual, Q6H PRN, Hugelmeyer, Alexis, DO .  Influenza vac split quadrivalent PF (FLUZONE HIGH-DOSE)  injection 0.5 mL, 0.5 mL, Intramuscular, Tomorrow-1000, Hugelmeyer, Alexis, DO .  ipratropium (ATROVENT) nebulizer solution 0.5 mg, 0.5 mg, Nebulization, Q6H PRN, Hugelmeyer, Alexis, DO .  levothyroxine (SYNTHROID, LEVOTHROID) tablet 125 mcg, 125 mcg, Oral, QAC breakfast, Loletha Grayer, MD, 125 mcg at 07/10/17 0921 .  LORazepam (ATIVAN) tablet 0.5 mg, 0.5 mg, Oral, Q8H PRN, Hugelmeyer, Alexis, DO .  morphine (MS CONTIN) 12 hr tablet 60 mg, 60 mg, Oral, Q12H, Hugelmeyer, Alexis, DO, 60 mg at 07/10/17 1017 .  morphine 2 MG/ML injection 2 mg, 2 mg, Intravenous, Q3H PRN, Loletha Grayer, MD, 2 mg at 07/07/17 1401 .  multivitamin with minerals tablet 1 tablet, 1 tablet, Oral, QPC breakfast, Hugelmeyer, Alexis, DO, 1 tablet at 07/10/17 0923 .  ondansetron (ZOFRAN) tablet 4 mg, 4 mg, Oral, Q6H PRN **OR** ondansetron (ZOFRAN) injection 4 mg, 4 mg, Intravenous, Q6H PRN, Hugelmeyer, Alexis, DO, 4 mg at 07/09/17 2200 .  pantoprazole (PROTONIX) EC tablet 40 mg, 40 mg, Oral, Daily, Hugelmeyer, Alexis, DO, 40 mg at 07/10/17 5102 .  polyethylene glycol (MIRALAX / GLYCOLAX) packet 17 g, 17 g, Oral, QPC breakfast, Loletha Grayer, MD, 17 g at 07/10/17 0921 .  predniSONE (DELTASONE) tablet 10 mg, 10 mg, Oral, Q breakfast, Hugelmeyer, Alexis, DO, 10 mg at 07/10/17 4536 .  prochlorperazine (COMPAZINE) tablet 10 mg, 10 mg, Oral, Q6H PRN, Hugelmeyer, Alexis, DO .  ramipril (ALTACE) capsule 10 mg, 10 mg, Oral, QPC breakfast, Hugelmeyer, Alexis, DO, 10 mg at 07/10/17 4680 .  senna (SENOKOT) tablet 8.6 mg, 1 tablet, Oral, BID, Lenis Noon, RPH, 8.6 mg at 07/10/17 3212 .  sertraline (ZOLOFT) tablet 100 mg, 100 mg, Oral, QPC breakfast, Hugelmeyer, Alexis, DO, 100 mg at 07/10/17 2482 .  sodium chloride flush (NS) 0.9 % injection 3 mL, 3 mL, Intravenous, Q12H, Hugelmeyer, Alexis, DO, 3 mL at 07/10/17 0924 .  sodium phosphate (FLEET) 7-19 GM/118ML enema 1 enema, 1 enema, Rectal, Daily PRN, Loletha Grayer, MD, 1 enema at  07/07/17 0941 .  tamsulosin (FLOMAX) capsule 0.8 mg, 0.8 mg, Oral, QPC supper, Hugelmeyer, Alexis, DO, 0.8 mg at 07/09/17 1740  Facility-Administered Medications Ordered in Other Encounters:  .  heparin lock flush 100 unit/mL, 500 Units, Intravenous, Once, Earlie Server, MD .  sodium chloride flush (NS) 0.9 % injection 10 mL, 10 mL, Intravenous, PRN, Earlie Server, MD, 10 mL at 07/03/17 0930   Physical exam:  Vitals:   07/09/17 0700 07/09/17 1255 07/09/17 2008 07/10/17 0453  BP: (!) 154/74 (!) 158/79 (!) 148/72 (!) 154/77  Pulse: (!) 55 67 (!) 106 (!) 55  Resp: 16  13 15   Temp: 98 F (36.7 C) 98.7 F (37.1 C) 98.1 F (36.7 C) 98 F (36.7 C)  TempSrc: Oral Oral Oral Oral  SpO2: 97% 96% 99% 97%  Weight:      Height:       Physical Exam GENERAL: No distress, well nourished.  SKIN:  No rashes or significant lesions  HEAD: Normocephalic, No masses, lesions, tenderness or abnormalities  EYES: Conjunctiva are pink, non icteric ENT: External ears normal ,lips , buccal mucosa, and tongue normal and mucous membranes are moist  LYMPH: No palpable cervical and axillary lymphadenopathy  LUNGS: Clear to auscultation, no crackles or wheezes HEART: Regular rate & rhythm, no murmurs, no gallops, S1 normal and S2 normal  ABDOMEN: Abdomen soft, non-tender, normal bowel sounds, I did not appreciate any  masses or organomegaly  MUSCULOSKELETAL: lumbar spine tenderness.  EXTREMITIES: No edema, no skin discoloration or tenderness NEURO: Alert &oriented, lower extremity strength 3/5 bilaterllay. Normal upper extremity     CMP Latest Ref Rng & Units 07/10/2017  Glucose 65 - 99 mg/dL 80  BUN 6 - 20 mg/dL 10  Creatinine 0.61 - 1.24 mg/dL 0.96  Sodium 135 - 145 mmol/L 133(L)  Potassium 3.5 - 5.1 mmol/L 3.8  Chloride 101 - 111 mmol/L 98(L)  CO2 22 - 32 mmol/L 27  Calcium 8.9 - 10.3 mg/dL 8.7(L)  Total Protein 6.5 - 8.1 g/dL -  Total Bilirubin 0.3 - 1.2 mg/dL -  Alkaline Phos 38 - 126 U/L -  AST 15 - 41  U/L -  ALT 17 - 63 U/L -   CBC Latest Ref Rng & Units 07/10/2017  WBC 3.8 - 10.6 K/uL 2.8(L)  Hemoglobin 13.0 - 18.0 g/dL 9.0(L)  Hematocrit 40.0 - 52.0 % 27.1(L)  Platelets 150 - 440 K/uL 76(L)    @IMAGES @  Mr Jeri Cos Wo Contrast  Result Date: 06/16/2017 CLINICAL DATA:  Intractable vomiting and nausea.  Bladder cancer  EXAM: MRI HEAD WITHOUT AND WITH CONTRAST TECHNIQUE: Multiplanar, multiecho pulse sequences of the brain and surrounding structures were obtained without and with intravenous contrast. CONTRAST:  48m MULTIHANCE GADOBENATE DIMEGLUMINE 529 MG/ML IV SOLN COMPARISON:  None. FINDINGS: Brain: Mild generalized atrophy. Negative for acute infarct. Mild chronic microvascular ischemic change in the white matter. Brainstem and cerebellum normal. Negative for hemorrhage or mass. Normal enhancement postcontrast infusion. Vascular: Normal arterial flow voids Skull and upper cervical spine: Negative Sinuses/Orbits: Negative Other: None IMPRESSION: Atrophy and mild chronic microvascular ischemia. No acute abnormality. Negative for metastatic disease. Electronically Signed   By: CFranchot GalloM.D.   On: 06/16/2017 16:00   Ct Abdomen Pelvis W Contrast  Result Date: 07/06/2017 CLINICAL DATA:  Vomiting. 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 EXAM: CT ABDOMEN AND PELVIS WITH CONTRAST TECHNIQUE: Multidetector CT imaging of the abdomen and pelvis was performed using the standard protocol following bolus administration of intravenous contrast. CONTRAST:  755mISOVUE-300 IOPAMIDOL (ISOVUE-300) INJECTION 61% COMPARISON:  CT abdomen pelvis 05/03/2017 FINDINGS: Lower chest: No pulmonary nodules or pleural effusion. No visible pericardial effusion. Hepatobiliary: Normal hepatic contours and density. No visible biliary dilatation. Normal gallbladder. Pancreas: Normal contours without ductal dilatation. No peripancreatic fluid collection. Spleen:  Normal. Adrenals/Urinary Tract: --Adrenal glands: Normal. --Right kidney/ureter: There is diffuse abnormality of the renal hilum with loss of the normal enhancement pattern of most of the renal parenchyma. This extends to the right-sided renal vessels and encases approximately 270 degrees of the aorta. There is also invasion of the inferior vena cava. Invasion into the adjacent vertebral body anterior wall has worsened. 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 There is periureteral stranding. --Left kidney/ureter: Mild left hydroureteronephrosis without a clear obstructing lesion. The left kidney itself is normal. The left renal vein's inflow into the IVC is likely occluded. It may be draining via lumbar veins. --Urinary bladder: Distended but otherwise unremarkable. Stomach/Bowel: --Stomach/Duodenum: No hiatal hernia or other gastric abnormality. Normal duodenal course and caliber. --Small bowel: No dilatation or inflammation. --Colon: Moderate colonic stool volume. Mild sigmoid diverticulosis. --Appendix: Not visualized. No right lower quadrant inflammation or free fluid. Vascular/Lymphatic: There is invasion of the inferior vena cava, encasement of the aorta and obliteration of the right renal vessels, as described above. There is atherosclerotic calcification of the non aneurysmal abdominal aorta. No abdominal or pelvic lymphadenopathy. Reproductive: Normal prostate and seminal vesicles. Musculoskeletal. There is invasion of the L2 vertebral body by the above-described mass of the renal hilum. Prominent soft tissue along the ventral aspect of the lumbar spinal canal may indicate dilated venous plexus, as described on the prior MRI. Other: Small amount of fluid in the left inguinal canal. IMPRESSION: 1. Progression of mass at the right renal hilum, which now invades and occludes the right renal artery and vein and the inferior vena cava. The mass also encases approximately 270 degrees of the aorta  and invades the anterior aspect of the L2 vertebral body. 2. No focal gastrointestinal abnormality. 3. Aortic atherosclerosis (ICD10-I70.0). Electronically Signed   By: KeUlyses Jarred.D.   On: 07/06/2017 22:24   Dg Chest Port 1 View  Result Date: 07/06/2017 CLINICAL DATA:  Vomiting today. The patient is undergoing chemotherapy for urothelial carcinoma. EXAM: PORTABLE CHEST 1 VIEW COMPARISON:  Single-view of the chest 03/23/2017. CT chest, abdomen and pelvis 05/03/2017. FINDINGS: Port-A-Cath is in place. The patient is status post CABG. Lungs are clear. Heart size is normal.  No pneumothorax or pleural effusion. No acute bony abnormality. IMPRESSION: No acute disease. Electronically Signed   By: Inge Rise M.D.   On: 07/06/2017 20:18    Assessment and plan-  Patient is a 76 y.o. male who is currently being treated for locally advance transitional cell carcinoma of right kidney with Gemcitabine and Carboplatin presented with abdominal pain, nausea/vomiting, urinary retention due to inability to self catheterize.  1 Locally advanced right renal urothelial carcinoma, inoperable, currently on systemic chemotherapy, now with CT evidence of disease progression, and functional declining. Tumor is progressively encasing renal vein and aorta and Inferior vena cava,  I continued our discussion regarding goals of care. Patient has not made up his mind yet. 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 Currently he appears that he leaning towards to continue treatment as outpatient. I I will tentatively book an appointment for patient to follow up with me on 07/27/2017 for evaluation prior to tentative immunotherapy with Pembrolizumab. He understands that if he is too weak I would not be able to offer. And he understands that the expectoration for the immunotherapy to be effective is low due to his 0% expression of PD-L1. If he continued to  decline, at that point it will be obvious that he will need to go to hospice route.  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 Vascular surgery has evaluated patient and there is no plan for surgical intervention. Recommend starting anticoagulation with Eliquis 2.87m BID. He declined. Patient is hesitant for restarting Eliquis at this point and want to continue to think about it. I advised him to be restarted on subcutaneous heparin for DVT prophylaxis.  3 thrombocytopenia, likely due to chemotherapy. Nadir was 59,000 and today it has improved to 76,000. Anticipate platelets to recover in the next few days.  Dr. ZEarlie Server MD, PhD CClara Barton Hospitalat ABiospine OrlandoPager- [phone]/16/2018

## 2017-07-10 NOTE — Care Management (Signed)
Discharge to home today per Dr. Leslye Peer. Will be followed by [facility]. Floydene Flock, [facility] representative updated. Wife stated that she could transport home yesterday per car. Shelbie Ammons RN MSN C[facility] Care Management [phone]

## 2017-07-10 NOTE — Discharge Summary (Signed)
[facility] [place] NAME: Joseph Ritter    MR#:  [id number]  DATE OF BIRTH:  [birth year]  DATE OF ADMISSION:  07/06/2017 ADMITTING PHYSICIAN: Harvie Bridge, DO  DATE OF DISCHARGE: 07/10/2017  PRIMARY CARE PHYSICIAN: [name], MD    ADMISSION DIAGNOSIS:  Dehydration [E86.0] Lower abdominal pain [R10.30] Nausea vomiting and diarrhea [R11.2, R19.7] Malignant neoplasm of right kidney (HCC) [C64.1]  DISCHARGE DIAGNOSIS:  Malignant neoplasm of the right kidney  SECONDARY DIAGNOSIS:   Past Medical History:  Diagnosis Date  . Acid indigestion 03/09/2014  . Adult hypothyroidism 11/18/2012  . Anxiety   . Arteriosclerosis of coronary artery 11/18/2012   Overview:  PATENT LIMA TO LAD, PATENT SVG TO PDA AND OCCULUDED SVG TO OM2 BY CATHERIZATION 02/09/2009   . 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 Borderline diabetes 05/25/2014  . BP (high blood pressure) 11/18/2012  . BPH (benign prostatic hypertrophy)   . CAFL (chronic airflow limitation) ([place]) 05/03/2015  . Cancer ([place])    renal cancer with mets  . CCF (congestive cardiac failure) ([place]) 01/03/2014   Overview:  HX OF   . Clinical depression 05/03/2015  . COPD (chronic obstructive pulmonary disease) ([place])   . DDD (degenerative disc disease), lumbar 06/29/2014  . 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 Gastric ulcer 03/09/2014  . 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 History of urinary self-catheterization 2017  . HLD (hyperlipidemia) 01/03/2014  . Incomplete bladder emptying 11/18/2012  . Leg weakness 11/18/2012  . Lumbar radiculitis   . Neuritis or radiculitis due to rupture of lumbar intervertebral disc 06/29/2014  . Overactive bladder   . PONV (postoperative nausea and vomiting)    years ago with Ether, no problem with Nausea or vomiting with  the last few surgerys  . Pre-diabetes   . 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 Teeth problem    pt reports "bad teeth", "need to be pulled"    HOSPITAL COURSE:   1.  Severe abdominal pain. Likely a combination of factors with constipation and urinary retention. Patient relieved after Foley cath placement and Fleet enema.I did prescribe the fentanyl patch and patient takes oral pain medications also. 2. 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  This is encroaching on the renal vein and artery and inferior vena cava. The patient refused blood thinner. Overall prognosis is poor. The patient is a DO NOT RESUSCITATE. Followed with palliative care at home. Patient still undecided about immunotherapy versus no treatment at this point. 3. Nausea vomiting. Likely secondary to abdominal process. I prescribedPhenergan suppositories and Phenergan pills. 4. Sepsis ruled out and antibiotics were stopped. 5. Hypothyroidism unspecified I increased the dose of levothyroxine secondary t TSH 6. Hyperlipidemia unspecified on Zocor and Zetia 7. BPH on Flomax. Foley catheter placed for urinary retention. Nursing taught patient and family on draining urinary catheter. This will have to be changed every 3 weeks. 8. GERD on Protonix 9. Depression on Zoloft and Wellbutrin 10. Nonsustained ventricular tachycardia secondary to electrolyte  Abnormalities. The patient did have triple-vessel disease on prior cardiac catheterization 11. Pancytopenia secondary to treatment. Platelet count starting to recover. Patient still anemic with  Last hemoglobin of 9.0  DISCHARGE CONDITIONS:   guarded  CONSULTS OBTAINED:  Treatment Team:  Earlie Server, MD Schnier, Dolores Lory, MD  DRUG  ALLERGIES:  No Known Allergies  DISCHARGE MEDICATIONS:   Current Discharge Medication List    START taking these medications   Details  feeding supplement, ENSURE ENLIVE, (ENSURE ENLIVE) LIQD Take 237 mLs by mouth 2  (two) times daily between meals. Qty: 60 Bottle, Refills: 0    fentaNYL (DURAGESIC - DOSED MCG/HR) 25 MCG/HR patch Place 1 patch (25 mcg total) onto the skin every 3 (three) days. Qty: 10 patch, Refills: 0    Magnesium Oxide 400 MG CAPS Take 1 capsule (400 mg total) by mouth daily. Qty: 30 capsule, Refills: 0    promethazine (PHENERGAN) 25 MG suppository Place 1 suppository (25 mg total) rectally every 6 (six) hours as needed for nausea. Qty: 30 suppository, Refills: 0    sodium phosphate (FLEET) 7-19 GM/118ML ENEM Place 133 mLs (1 enema total) rectally daily as needed for severe constipation. Qty: 5 enema, Refills: 0      CONTINUE these medications which have CHANGED   Details  levothyroxine (SYNTHROID, LEVOTHROID) 125 MCG tablet Take 1 tablet (125 mcg total) by mouth daily before breakfast. Qty: 30 tablet, Refills: 0    promethazine (PHENERGAN) 25 MG tablet Take 1 tablet (25 mg total) by mouth every 6 (six) hours as needed for nausea or vomiting. Qty: 30 tablet, Refills: 0      CONTINUE these medications which have NOT CHANGED   Details  acetaminophen (TYLENOL) 500 MG tablet Take 500 mg by mouth 2 (two) times daily as needed (for pain.).    Associated Diagnoses: Bladder neurogenesis; Incomplete bladder emptying; Benign fibroma of prostate    aspirin EC 81 MG tablet Take 81 mg by mouth daily.    buPROPion (WELLBUTRIN SR) 100 MG 12 hr tablet TAKE ONE TABLET BY MOUTH EVERY DAY    cyclobenzaprine (FLEXERIL) 10 MG tablet Take 10 mg by mouth at bedtime as needed for muscle spasms.    ezetimibe-simvastatin (VYTORIN) 10-40 MG tablet Take 1 tablet by mouth at bedtime.    HYDROcodone-acetaminophen (NORCO) 7.5-325 MG tablet Take 1 tablet by mouth every 4 (four) hours as needed for moderate pain. Qty: 180 tablet, Refills: 0    hyoscyamine (ANASPAZ) 0.125 MG TBDP disintergrating tablet Place 0.125 mg under the tongue every 6 (six) hours as needed (for abdominal cramping due to IBS).     ketoconazole (NIZORAL) 2 % shampoo APPLY SHAMPOO TO HAIR AS NEEDED FOR FLAKY/ITCHY SCALP (TYPICALLY EVERY 2-3 DAYS)   Associated Diagnoses: Bladder neurogenesis; Incomplete bladder emptying; Benign fibroma of prostate    lidocaine (LIDODERM) 5 % Place 1 patch onto the skin every 12 (twelve) hours. 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 Qty: 30 tablet, Refills: 0    Methylcellulose, Laxative, (CITRUCEL PO) Take 1 tablet by mouth daily after breakfast.    morphine (MS CONTIN) 30 MG 12 hr tablet Take 60 mg by mouth every 12 (twelve) hours.    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 CENTRUM SILVER    ondansetron (ZOFRAN) 8 MG tablet Take 1 tablet (8 mg total) by mouth 2 (two) times daily as needed for refractory nausea / vomiting. Start on day 3 after carboplatin chemo. 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 IBgard (active ingredient: 90 mg ultrapurified peppermint oil)    predniSONE (DELTASONE) 10 MG tablet Take 1 tablet (10 mg total) by mouth daily with breakfast. 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 AFTER BREAKFAST EITHER TAKE A PROBIOTIC OR EAT YOGURT    RABEprazole (ACIPHEX) 20 MG tablet Take 20 mg by mouth daily before breakfast.    Associated Diagnoses: Bladder neurogenesis; Incomplete bladder emptying; Benign fibroma of prostate    ramipril (ALTACE) 10 MG capsule Take 10 mg by mouth daily after breakfast.    senna (SENOKOT) 8.6 MG TABS tablet Take 2 tablets (17.2 mg total) by mouth daily. Qty: 120 each, Refills: 0    sertraline (ZOLOFT) 100 MG tablet Take 100 mg by mouth daily after breakfast.    tamsulosin (FLOMAX) 0.4 MG CAPS capsule Take 0.8 mg by mouth daily after supper.    Associated Diagnoses: Bladder neurogenesis; Incomplete bladder emptying; Benign fibroma of prostate    polyethylene glycol powder (GLYCOLAX/MIRALAX) powder Take 17 g by mouth daily after breakfast. Mix with glass of water Qty: 255 g, Refills: 1      STOP taking these medications     prochlorperazine (COMPAZINE) 10 MG tablet          DISCHARGE INSTRUCTIONS:   Follow-up PMD one week Follow-up oncology 2 weeks  If you experience worsening of your admission symptoms, develop shortness of breath, life threatening emergency, suicidal or homicidal thoughts you must seek medical attention immediately by calling 911 or calling your MD immediately  if symptoms less severe.  You Must read complete instructions/literature along with all the possible adverse reactions/side effects for all the Medicines you take and that have  been prescribed to you. Take any new Medicines after you have completely understood and accept all the possible adverse reactions/side effects.   Please note  You were cared for by a hospitalist during your hospital stay. If you have any questions about your discharge medications or the care you received while you were in the hospital after you are discharged, you can call the unit and asked to speak with the hospitalist on call if the hospitalist that took care of you is not available. Once you are discharged, your primary care physician will handle any further medical issues. Please note that NO REFILLS for any discharge medications will be authorized once you are discharged, as it is imperative that you return to your primary care physician (or establish a relationship with a primary care physician if you do not have one) for your aftercare needs so that they can reassess your need for medications and monitor your lab values.    Today  CHIEF COMPLAINT:   Chief Complaint  Patient presents with  . Emesis    HISTORY OF PRESENT ILLNESS:  Joseph Ritter  is a 76 y.o. male with a known history of renal cancer presented with vomiting   VITAL SIGNS:  Blood pressure (!) 154/77, pulse (!) 55, temperature 98 F (36.7 C), temperature source Oral, resp. rate 15, height 5\' 7"  (1.702 m), weight 65.8 kg (145 lb), SpO2 97 %.    PHYSICAL EXAMINATION:  GENERAL:  76 y.o.-year-old patient lying in the bed with no acute distress.  EYES: Pupils equal, round, reactive to light and accommodation. No scleral icterus. Extraocular muscles intact.  HEENT: Head atraumatic, normocephalic. Oropharynx and nasopharynx clear.  NECK:  Supple, no jugular venous distention. No thyroid enlargement, no tenderness.  LUNGS: Normal breath sounds bilaterally, no wheezing, rales,rhonchi or crepitation. No use of accessory muscles of respiration.  CARDIOVASCULAR: S1, S2 normal. No murmurs, rubs, or gallops.  ABDOMEN: Soft,  non-tender, non-distended. Bowel sounds present. No organomegaly or mass.  EXTREMITIES: No pedal edema, cyanosis, or clubbing.  NEUROLOGIC: Cranial nerves II through XII are intact. Muscle strength 5/5 in all extremities. Sensation intact. Gait not checked.  PSYCHIATRIC: The patient is alert and oriented x 3.  SKIN: No obvious rash, lesion, or ulcer.   DATA REVIEW:   CBC  Recent Labs Lab 07/10/17 0546  WBC 2.8*  HGB 9.0*  HCT 27.1*  PLT 76*    Chemistries   Recent Labs Lab 07/07/17 0739  07/10/17 0750  NA 139  < > 133*  K 3.9  < > 3.8  CL 100*  < > 98*  CO2 29  < > 27  GLUCOSE 123*  < > 80  BUN 15  < > 10  CREATININE 1.27*  < > 0.96  CALCIUM 8.8*  < > 8.7*  MG  --   --  1.6*  AST 27  --   --   ALT 34  --   --   ALKPHOS 88  --   --   BILITOT 0.9  --   --   < > = values in this interval not displayed.  Cardiac Enzymes  Recent Labs Lab 07/07/17 1412  TROPONINI 0.05*    Microbiology Results  Results for orders placed or performed during the hospital encounter of 07/06/17  Blood Culture (routine x 2)     Status: None (Preliminary result)   Collection Time: 07/06/17  7:39 PM  Result Value Ref Range Status   Specimen Description BLOOD RIGHT ANTECUBITAL  Final   Special Requests   Final    BOTTLES DRAWN AEROBIC AND ANAEROBIC Blood Culture adequate volume   Culture NO GROWTH 4 DAYS  Final   Report Status PENDING  Incomplete  Blood Culture (routine x 2)     Status: None (Preliminary result)   Collection Time: 07/06/17  7:39 PM  Result Value Ref Range Status   Specimen Description BLOOD BLOOD RIGHT WRIST  Final   Special Requests   Final    BOTTLES DRAWN AEROBIC AND ANAEROBIC Blood Culture adequate volume   Culture NO GROWTH 4 DAYS  Final   Report Status PENDING  Incomplete  Urine culture     Status: Abnormal   Collection Time: 07/06/17 11:00 PM  Result Value Ref Range Status   Specimen Description URINE, RANDOM  Final   Special Requests NONE  Final    Culture (A)  Final    30,000 COLONIES/mL STAPHYLOCOCCUS SPECIES (COAGULASE NEGATIVE)   Report  Status 07/09/2017 FINAL  Final   Organism ID, Bacteria STAPHYLOCOCCUS SPECIES (COAGULASE NEGATIVE) (A)  Final      Susceptibility   Staphylococcus species (coagulase negative) - MIC*    CIPROFLOXACIN >=8 RESISTANT Resistant     GENTAMICIN <=0.5 SENSITIVE Sensitive     NITROFURANTOIN <=16 SENSITIVE Sensitive     OXACILLIN >=4 RESISTANT Resistant     TETRACYCLINE >=16 RESISTANT Resistant     VANCOMYCIN 2 SENSITIVE Sensitive     TRIMETH/SULFA 160 RESISTANT Resistant     CLINDAMYCIN <=0.25 SENSITIVE Sensitive     RIFAMPIN <=0.5 SENSITIVE Sensitive     Inducible Clindamycin NEGATIVE Sensitive     * 30,000 COLONIES/mL STAPHYLOCOCCUS SPECIES (COAGULASE NEGATIVE)      Management plans discussed with the patient, family and they are in agreement.  CODE STATUS:     Code Status Orders        Start     Ordered   07/07/17 1345  Do not attempt resuscitation (DNR)  Continuous    Question Answer Comment  In the event of cardiac or respiratory ARREST Do not call a "code blue"   In the event of cardiac or respiratory ARREST Do not perform Intubation, CPR, defibrillation or ACLS   In the event of cardiac or respiratory ARREST Use medication by any route, position, wound care, and other measures to relive pain and suffering. May use oxygen, suction and manual treatment of airway obstruction as needed for comfort.   Comments nurse may pronounce      07/07/17 1344    Code Status History    Date Active Date Inactive Code Status Order ID Comments User Context   07/07/2017  1:41 AM 07/07/2017  1:44 PM Full Code [id number]  Harvie Bridge, DO Inpatient   05/02/2017  5:04 PM 05/04/2017  6:51 PM Full Code [id number]  [name], MD Inpatient   04/25/2017  8:51 AM 04/25/2017  2:36 PM Full Code [id number]  Isaias Cowman, MD Inpatient    Advance Directive Documentation     Most Recent Value  Type  of Advance Directive  Living will  Pre-existing out of facility DNR order (yellow form or pink MOST form)  -  "MOST" Form in Place?  -      TOTAL TIME TAKING CARE OF THIS PATIENT: 35 minutes.    Loletha Grayer M.D on 07/10/2017 at 3:10 PM  Between 7am to 6pm - Pager - [phone]  After 6pm go to [email or web address] - password [email or web address]  [employer] Office  [phone]  CC: Primary care physician; [name], MD

## 2017-07-10 NOTE — Progress Notes (Signed)
Patient discharged home with home health. Prescriptions given to patient and wife. All discharge instructions given and all questions answered.

## 2017-07-11 ENCOUNTER — Other Ambulatory Visit: Payer: Self-pay | Admitting: Oncology

## 2017-07-11 LAB — CULTURE, BLOOD (ROUTINE X 2)
CULTURE: NO GROWTH
CULTURE: NO GROWTH
SPECIAL REQUESTS: ADEQUATE
Special Requests: ADEQUATE

## 2017-07-11 NOTE — Progress Notes (Signed)
DISCONTINUE ON PATHWAY REGIMEN - Bladder     A cycle is every 21 days:     Carboplatin      Gemcitabine   **Always confirm dose/schedule in your pharmacy ordering system**    REASON: Disease Progression PRIOR TREATMENT: [id number]: Carboplatin AUC=5 D1 + Gemcitabine 1,000 mg/m2 D1, 8 q21 Days for a Maximum of 6 Cycles TREATMENT RESPONSE: Progressive Disease (PD)  START ON PATHWAY REGIMEN - Bladder     A cycle is every 21 days:     Atezolizumab   **Always confirm dose/schedule in your pharmacy ordering system**    Patient Characteristics: Metastatic Disease, First Line, No Prior Neoadjuvant/Adjuvant Therapy AJCC M Category: M1 AJCC N Category: NX AJCC T Category: T3 Current evidence of distant metastases<= Yes AJCC 8 Stage Grouping: Unknown Line of therapy: First Line Would you be surprised if this patient died  in the next year<= I would be surprised if this patient died in the next year Prior Neoadjuvant/Adjuvant Therapy<= No Intent of Therapy: Non-Curative / Palliative Intent, Discussed with Patient

## 2017-07-12 ENCOUNTER — Other Ambulatory Visit: Payer: Self-pay | Admitting: Oncology

## 2017-07-16 ENCOUNTER — Telehealth: Payer: Self-pay

## 2017-07-16 NOTE — Telephone Encounter (Signed)
Nutrition Assessment   Reason for Assessment:   Patient identified on Malnutrition Screening Report for poor appetite and weight loss  ASSESSMENT:  76 year old male with transitional cell carcinoma of right kidney on chemotherapy.  Noted recent hospital admission.  Past medical history of CKD stage 3, borderline DM, GERD, HTN, CAD.  Called and spoke with patient wife this am as patient still in bed.  Wife reports that patient appetite has improved since hospital admission although still eating small amounts.  Reports only 2 episodes of vomiting since being home from hospital.  Wife thinks those were related to pain.  Patient reports patient is eating small meals of soups, pudding, jello, soft foods.  Reports he is drinking a variety of oral nutrition supplements (ensure orignial, ensure enlive, etc) about 2 per day.  Reports that he is taking stool softners to help with constipation.    Nutrition Focused Physical Exam: deferred   Medications: reviewed  Labs: reviewed  Anthropometrics:   Height: 67 inches Weight: 145 lb BMI: 22  Noted weight of 158 lb on 9/14  8% weight loss in the last month, significant   Estimated Energy Needs  Kcals: 1980-2300 calories/d Protein: 99-115 g/d Fluid: 2.3 L/d  NUTRITION DIAGNOSIS: Inadequate oral intake related to pain as evidenced by nausea, vomiting, 8% weight loss in 1 month and poor po intake   MALNUTRITION DIAGNOSIS: likely but unable to determine as assessment via phone   INTERVENTION:   Discussed with wife strategies to increase calories and protein. Will mail factsheet. Discussed oral nutrition supplements and encouraged patient to continue to drink them.  Coupons mailed Contact information mailed and wife encouraged to call me with questions or if appointment needed    MONITORING, EVALUATION, GOAL: weight trends, intake   NEXT VISIT: wife to contact me as needed  Sherriann Szuch [name], [place], [place] Registered Dietitian [phone]  (pager)

## 2017-07-23 ENCOUNTER — Telehealth: Payer: Self-pay | Admitting: *Deleted

## 2017-07-23 ENCOUNTER — Other Ambulatory Visit: Payer: Self-pay | Admitting: *Deleted

## 2017-07-23 MED ORDER — HYDROCODONE-ACETAMINOPHEN 7.5-325 MG PO TABS: 1.0000 | ORAL_TABLET | ORAL | 0 refills | Status: DC | PRN

## 2017-07-23 NOTE — Telephone Encounter (Signed)
Patien's wife called to request Norco refill.

## 2017-07-23 NOTE — Telephone Encounter (Signed)
Rx printed and signed by Lennon Alstrom

## 2017-07-24 ENCOUNTER — Telehealth: Payer: Self-pay | Admitting: *Deleted

## 2017-07-24 DIAGNOSIS — Z7189 Other specified counseling: Secondary | ICD-10-CM

## 2017-07-24 DIAGNOSIS — N183 Chronic kidney disease, stage 3 unspecified: Secondary | ICD-10-CM

## 2017-07-24 DIAGNOSIS — C7951 Secondary malignant neoplasm of bone: Secondary | ICD-10-CM

## 2017-07-24 DIAGNOSIS — R112 Nausea with vomiting, unspecified: Secondary | ICD-10-CM

## 2017-07-24 DIAGNOSIS — D6959 Other secondary thrombocytopenia: Secondary | ICD-10-CM

## 2017-07-24 DIAGNOSIS — T451X5A Adverse effect of antineoplastic and immunosuppressive drugs, initial encounter: Secondary | ICD-10-CM

## 2017-07-24 DIAGNOSIS — Z5111 Encounter for antineoplastic chemotherapy: Secondary | ICD-10-CM

## 2017-07-24 DIAGNOSIS — C641 Malignant neoplasm of right kidney, except renal pelvis: Secondary | ICD-10-CM

## 2017-07-24 MED ORDER — MORPHINE SULFATE ER 30 MG PO TBCR: 60.0000 mg | EXTENDED_RELEASE_TABLET | Freq: Two times a day (BID) | ORAL | 0 refills | Status: DC

## 2017-07-24 NOTE — Telephone Encounter (Signed)
K to refill per Lorretta Harp, NP. Wife informed and will send sister in law to pick up prescription

## 2017-07-24 NOTE — Telephone Encounter (Signed)
Call from s/o regarding not getting but prescription yesterday, States he should have gotten MS Contin refill also. Patient is on Fentanyl and Norco also, Fentanyl was started in hospital 10/16. Should he be on MS ER, Fentanyl and Norco? Please advise

## 2017-07-26 ENCOUNTER — Other Ambulatory Visit: Payer: Self-pay | Admitting: Oncology

## 2017-07-27 ENCOUNTER — Ambulatory Visit
Admission: RE | Admit: 2017-07-27 | Discharge: 2017-07-27 | Disposition: A | Discharge status: Home / Self Care | Payer: Medicare Other | Source: Ambulatory Visit | Attending: Radiation Oncology | Admitting: Radiation Oncology

## 2017-07-27 ENCOUNTER — Encounter: Payer: Self-pay | Admitting: Oncology

## 2017-07-27 ENCOUNTER — Inpatient Hospital Stay: Payer: Medicare Other

## 2017-07-27 ENCOUNTER — Encounter: Payer: Self-pay | Admitting: Radiation Oncology

## 2017-07-27 ENCOUNTER — Telehealth: Payer: Self-pay | Admitting: *Deleted

## 2017-07-27 ENCOUNTER — Inpatient Hospital Stay: Payer: Medicare Other | Attending: Oncology | Admitting: Oncology

## 2017-07-27 ENCOUNTER — Other Ambulatory Visit: Payer: Self-pay

## 2017-07-27 VITALS — BP 94/65 | HR 105 | Temp 95.6°F | Resp 18 | Ht 67.0 in | Wt 136.6 lb

## 2017-07-27 VITALS — BP 94/65 | HR 105 | Temp 95.6°F | Resp 18 | Wt 136.6 lb

## 2017-07-27 DIAGNOSIS — C649 Malignant neoplasm of unspecified kidney, except renal pelvis: Secondary | ICD-10-CM | POA: Diagnosis not present

## 2017-07-27 DIAGNOSIS — M549 Dorsalgia, unspecified: Secondary | ICD-10-CM | POA: Diagnosis not present

## 2017-07-27 DIAGNOSIS — Z5112 Encounter for antineoplastic immunotherapy: Secondary | ICD-10-CM | POA: Insufficient documentation

## 2017-07-27 DIAGNOSIS — Z87891 Personal history of nicotine dependence: Secondary | ICD-10-CM

## 2017-07-27 DIAGNOSIS — R530 Neoplastic (malignant) related fatigue: Secondary | ICD-10-CM

## 2017-07-27 DIAGNOSIS — Z7901 Long term (current) use of anticoagulants: Secondary | ICD-10-CM | POA: Insufficient documentation

## 2017-07-27 DIAGNOSIS — T451X5A Adverse effect of antineoplastic and immunosuppressive drugs, initial encounter: Secondary | ICD-10-CM

## 2017-07-27 DIAGNOSIS — T402X5S Adverse effect of other opioids, sequela: Secondary | ICD-10-CM

## 2017-07-27 DIAGNOSIS — C7951 Secondary malignant neoplasm of bone: Secondary | ICD-10-CM

## 2017-07-27 DIAGNOSIS — Z7952 Long term (current) use of systemic steroids: Secondary | ICD-10-CM

## 2017-07-27 DIAGNOSIS — C651 Malignant neoplasm of right renal pelvis: Secondary | ICD-10-CM

## 2017-07-27 DIAGNOSIS — Z923 Personal history of irradiation: Secondary | ICD-10-CM | POA: Diagnosis not present

## 2017-07-27 DIAGNOSIS — J449 Chronic obstructive pulmonary disease, unspecified: Secondary | ICD-10-CM

## 2017-07-27 DIAGNOSIS — Z7982 Long term (current) use of aspirin: Secondary | ICD-10-CM

## 2017-07-27 DIAGNOSIS — R7303 Prediabetes: Secondary | ICD-10-CM

## 2017-07-27 DIAGNOSIS — Z9221 Personal history of antineoplastic chemotherapy: Secondary | ICD-10-CM | POA: Diagnosis not present

## 2017-07-27 DIAGNOSIS — N319 Neuromuscular dysfunction of bladder, unspecified: Secondary | ICD-10-CM

## 2017-07-27 DIAGNOSIS — R531 Weakness: Secondary | ICD-10-CM

## 2017-07-27 DIAGNOSIS — G893 Neoplasm related pain (acute) (chronic): Secondary | ICD-10-CM | POA: Diagnosis not present

## 2017-07-27 DIAGNOSIS — N3281 Overactive bladder: Secondary | ICD-10-CM | POA: Diagnosis not present

## 2017-07-27 DIAGNOSIS — Z905 Acquired absence of kidney: Secondary | ICD-10-CM | POA: Diagnosis not present

## 2017-07-27 DIAGNOSIS — E860 Dehydration: Secondary | ICD-10-CM | POA: Diagnosis not present

## 2017-07-27 DIAGNOSIS — N183 Chronic kidney disease, stage 3 unspecified: Secondary | ICD-10-CM

## 2017-07-27 DIAGNOSIS — R140 Abdominal distension (gaseous): Secondary | ICD-10-CM | POA: Insufficient documentation

## 2017-07-27 DIAGNOSIS — E039 Hypothyroidism, unspecified: Secondary | ICD-10-CM

## 2017-07-27 DIAGNOSIS — M5136 Other intervertebral disc degeneration, lumbar region: Secondary | ICD-10-CM

## 2017-07-27 DIAGNOSIS — Z903 Acquired absence of stomach [part of]: Secondary | ICD-10-CM | POA: Diagnosis not present

## 2017-07-27 DIAGNOSIS — R109 Unspecified abdominal pain: Secondary | ICD-10-CM

## 2017-07-27 DIAGNOSIS — C641 Malignant neoplasm of right kidney, except renal pelvis: Secondary | ICD-10-CM

## 2017-07-27 DIAGNOSIS — C679 Malignant neoplasm of bladder, unspecified: Secondary | ICD-10-CM | POA: Diagnosis not present

## 2017-07-27 DIAGNOSIS — Z5111 Encounter for antineoplastic chemotherapy: Secondary | ICD-10-CM

## 2017-07-27 DIAGNOSIS — D6959 Other secondary thrombocytopenia: Secondary | ICD-10-CM | POA: Insufficient documentation

## 2017-07-27 DIAGNOSIS — D696 Thrombocytopenia, unspecified: Secondary | ICD-10-CM

## 2017-07-27 DIAGNOSIS — R112 Nausea with vomiting, unspecified: Secondary | ICD-10-CM | POA: Diagnosis not present

## 2017-07-27 DIAGNOSIS — K5903 Drug induced constipation: Secondary | ICD-10-CM | POA: Diagnosis not present

## 2017-07-27 DIAGNOSIS — E785 Hyperlipidemia, unspecified: Secondary | ICD-10-CM

## 2017-07-27 DIAGNOSIS — I2510 Atherosclerotic heart disease of native coronary artery without angina pectoris: Secondary | ICD-10-CM

## 2017-07-27 DIAGNOSIS — Z7189 Other specified counseling: Secondary | ICD-10-CM

## 2017-07-27 DIAGNOSIS — N400 Enlarged prostate without lower urinary tract symptoms: Secondary | ICD-10-CM

## 2017-07-27 DIAGNOSIS — R5383 Other fatigue: Secondary | ICD-10-CM | POA: Diagnosis not present

## 2017-07-27 DIAGNOSIS — I509 Heart failure, unspecified: Secondary | ICD-10-CM

## 2017-07-27 DIAGNOSIS — T451X5S Adverse effect of antineoplastic and immunosuppressive drugs, sequela: Secondary | ICD-10-CM | POA: Insufficient documentation

## 2017-07-27 DIAGNOSIS — Z79899 Other long term (current) drug therapy: Secondary | ICD-10-CM

## 2017-07-27 LAB — CBC WITH DIFFERENTIAL/PLATELET
BASOS ABS: 0 10*3/uL (ref 0–0.1)
Basophils Relative: 1 %
EOS ABS: 0.1 10*3/uL (ref 0–0.7)
EOS PCT: 1 %
HCT: 31.8 % — ABNORMAL LOW (ref 40.0–52.0)
Hemoglobin: 10.6 g/dL — ABNORMAL LOW (ref 13.0–18.0)
Lymphocytes Relative: 14 %
Lymphs Abs: 0.9 10*3/uL — ABNORMAL LOW (ref 1.0–3.6)
MCH: 29.3 pg (ref 26.0–34.0)
MCHC: 33.3 g/dL (ref 32.0–36.0)
MCV: 88.2 fL (ref 80.0–100.0)
Monocytes Absolute: 0.7 10*3/uL (ref 0.2–1.0)
Monocytes Relative: 11 %
Neutro Abs: 4.8 10*3/uL (ref 1.4–6.5)
Neutrophils Relative %: 73 %
PLATELETS: 217 10*3/uL (ref 150–440)
RBC: 3.61 MIL/uL — AB (ref 4.40–5.90)
RDW: 23.4 % — ABNORMAL HIGH (ref 11.5–14.5)
WBC: 6.5 10*3/uL (ref 3.8–10.6)

## 2017-07-27 LAB — COMPREHENSIVE METABOLIC PANEL
ALT: 31 U/L (ref 17–63)
AST: 27 U/L (ref 15–41)
Albumin: 3.5 g/dL (ref 3.5–5.0)
Alkaline Phosphatase: 104 U/L (ref 38–126)
Anion gap: 10 (ref 5–15)
BUN: 16 mg/dL (ref 6–20)
CHLORIDE: 96 mmol/L — AB (ref 101–111)
CO2: 26 mmol/L (ref 22–32)
CREATININE: 1.13 mg/dL (ref 0.61–1.24)
Calcium: 9.2 mg/dL (ref 8.9–10.3)
GFR calc non Af Amer: >60 mL/min (ref >60)
Glucose, Bld: 113 mg/dL — ABNORMAL HIGH (ref 65–99)
Potassium: 4.3 mmol/L (ref 3.5–5.1)
SODIUM: 132 mmol/L — AB (ref 135–145)
Total Bilirubin: 0.4 mg/dL (ref 0.3–1.2)
Total Protein: 6.9 g/dL (ref 6.5–8.1)

## 2017-07-27 LAB — TSH: TSH: 16.319 u[IU]/mL — ABNORMAL HIGH (ref 0.350–4.500)

## 2017-07-27 MED ORDER — SODIUM CHLORIDE 0.9 % IV SOLN
Freq: Once | INTRAVENOUS | Status: AC
  Administered 2017-07-27: 12:00:00 via INTRAVENOUS
  Filled 2017-07-27: qty 1000

## 2017-07-27 MED ORDER — HEPARIN SOD (PORK) LOCK FLUSH 100 UNIT/ML IV SOLN
500.0000 [IU] | Freq: Once | INTRAVENOUS | Status: AC | PRN
  Administered 2017-07-27: 500 [IU]
  Filled 2017-07-27: qty 5

## 2017-07-27 MED ORDER — PREDNISONE 10 MG PO TABS: 10.0000 mg | ORAL_TABLET | Freq: Every day | ORAL | 0 refills | Status: DC

## 2017-07-27 MED ORDER — APIXABAN 2.5 MG PO TABS: 2.5000 mg | ORAL_TABLET | Freq: Two times a day (BID) | ORAL | 0 refills | Status: DC

## 2017-07-27 MED ORDER — SODIUM CHLORIDE 0.9 % IV SOLN
1200.0000 mg | Freq: Once | INTRAVENOUS | Status: AC
  Administered 2017-07-27: 1200 mg via INTRAVENOUS
  Filled 2017-07-27: qty 20

## 2017-07-27 MED ORDER — APIXABAN 5 MG PO TABS: 5.0000 mg | ORAL_TABLET | Freq: Two times a day (BID) | ORAL | 0 refills | Status: DC

## 2017-07-27 NOTE — Telephone Encounter (Addendum)
MS ER approved for only #90 tabs in 30 days, if he needs more, will have to do a review, Call her back if needed  [phone] opt 5

## 2017-07-27 NOTE — Progress Notes (Addendum)
[facility] Follow Up visit  Patient Care Team: [name], MD as PCP - General (Physician Assistant)  REASON FOR VISIT Treatment of Locally advanced kidney urothelial cancer  ONCOLOGY HISTORY Joseph Ritter 76 y.o. male with PMH listed as below  is referred to [place] for evaluation of locally advanced kidney urothelia cancer. Patient developed gross hematuria at the end of 2017. CT 08/2016 showed endophytic hypodense mass. Repeat image 01/2017 showed enlarged  endophytic hypodense mass right kidney. 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 Right robotic nephroureterectomy was attempted but aborted due to unresectable newly metastatic disease diagnosed intraoperatively. Postop he underwent a CT scan of the abdomen and pelvis which showed extension of an infiltrative mass from the right kidney along the right renal vein toward the IVC with encasement of the IVC.  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 He self catheterize a few time a day.  He reports having back pain which limits her ability to walk. Also constipated and abdominal bloating. His appetite is fair. He takes Norco for abdominal and back pain.  He can walk with walker for very short distance, mostly limited by back pain and fatigue./weakness.  # Images: CT chest and abdomen 05/03/2017 1. Pneumoperitoneum with a small amount of ascites, gas tracking along the thoracoabdominal wall into the scrotum, and a small amount of gas in the right perirenal space-these findings are likely related to the patient's attempted nephroureterectomy yesterday. Surveillance to ensure expected resolution of the pneumoperitoneum might be appropriate. 2. The right kidney lower pole infarct with lack of patency of the more inferior of the 3 right renal arteries. There is delayed nephrogram on the right with increase an infiltrative process in theright  kidney and extending into the right renal hilar vascular structures, including the right renal vein and the adjacent IVC, compatible with tumor thrombus. There is also soft tissue density surrounding the renal hilar vascular structures and extending into the retroperitoneum and periaortic region, some of which may be tumor and some of which may be hematoma.3. Demineralization of the anterior inferior L2 vertebral body,concerning for tumor invasion. This is near the level of the retroaortic left renal vein. 4. No findings of metastatic disease to the chest. There are new trace bilateral pleural effusions with mild passive atelectasis.  #Pathology: 05/02/2017 SPECIMEN SUBMITTED: A. Hilar tissue, right kidney B. Hilar tissue, right kidney DIAGNOSIS:  A and B. SOFT TISSUE, RIGHT RENAL HILUM  PD-L1 - Urothelial (TECENTRIQ) Immunohistochemistry Analysis  Result: 0% Interpretation: No expression   # Current Treatment: 1 Palliative RT to spine 2 Chemotherapy:     06/01/2017 Cycle 1 Gemcitabine and Carboplain.  Day 8 Gemcitabine was delayed due to intractable nausea and vomiting.     Chemotherapy related thrombocytopenia, no active bleeding. 50% dose of Gemcitabine was given on day 11.     06/25/2017 Cycle 2 Day 1 Gemcitabine and Carboplain.    07/03/2017 Cycle 2 Day 8 chemotherapy discontinued.           INTERVAL HISTORY 76 yo male who has above oncology history reviewed by me today presents for evaluation for immunotherapy. He was recently hospitalized from 07/06/2017 to 07/10/2017 due to urosepsis. He was seen by palliative care and his pain medication was adjusted. Patient had CT abdomen pelvis done during that admission which revealed disease progression. Results was discussed with patient and his wife during admission. His prognosis is poor. We had  goal of care discussion and the patient would like to think it over with he wants to continue treatment or not. He feels that his not mentally ready for  hospice. Immunotherapy was discussed and he is interested. The decision was made to have him  recover from chemotherapy side effects and be reevaluated.   1 Chemotherapy induced nausea: Patient reports nausea vomiting has significantly improved since the discontinuation of chemotherapy. He is able to eat a little bit 2 Neoplasm related fatigue: he feels profoundly weak and fatigued.    3 Neoplasm related pain, mostly on his back, persistent, sharp in nature.  his pain is partially relieved with current pain medication.  4 Opioid associated constipation: Controlled with laxatives   Review of Systems  Constitutional: Positive for fatigue. Negative for chills and fever.  Eyes: Negative for eye problems.  Respiratory: Negative for cough and hemoptysis.   Cardiovascular: Negative for chest pain and leg swelling.  Gastrointestinal: Positive for abdominal pain. Negative for nausea and vomiting.  Genitourinary: Positive for difficulty urinating. Negative for hematuria.   Musculoskeletal: Positive for back pain.  Skin: Negative.   Neurological: Negative for headaches.  Hematological: Negative for adenopathy.  Psychiatric/Behavioral: Negative for confusion.     MEDICAL HISTORY: Past Medical History:  Diagnosis Date  . Acid indigestion 03/09/2014  . Adult hypothyroidism 11/18/2012  . Anxiety   . Arteriosclerosis of coronary artery 11/18/2012   Overview:  PATENT LIMA TO LAD, PATENT SVG TO PDA AND OCCULUDED SVG TO OM2 BY CATHERIZATION 02/09/2009   . 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 Borderline diabetes 05/25/2014  . BP (high blood pressure) 11/18/2012  . BPH (benign prostatic hypertrophy)   . CAFL (chronic airflow limitation) ([place]) 05/03/2015  . Cancer ([place])    renal cancer with mets  . CCF (congestive cardiac failure) ([place]) 01/03/2014   Overview:  HX OF   . Clinical depression 05/03/2015  . COPD (chronic obstructive pulmonary disease) ([place])   . DDD  (degenerative disc disease), lumbar 06/29/2014  . 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 Gastric ulcer 03/09/2014  . 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 History of urinary self-catheterization 2017  . HLD (hyperlipidemia) 01/03/2014  . Incomplete bladder emptying 11/18/2012  . Leg weakness 11/18/2012  . Lumbar radiculitis   . Neuritis or radiculitis due to rupture of lumbar intervertebral disc 06/29/2014  . Overactive bladder   . PONV (postoperative nausea and vomiting)    years ago with Ether, no problem with Nausea or vomiting with the last few surgerys  . Pre-diabetes   . Subacute transverse myelitis ([place]) 11/21/2012  . Teeth problem    pt reports "bad teeth", "need to be pulled"    SURGICAL HISTORY: Past Surgical History:  Procedure Laterality Date  . ARTERY BIOPSY Right 12/16/2015   Procedure: BIOPSY TEMPORAL ARTERY;  Surgeon: Algernon Huxley, MD;  Location: [facility];  Service: Vascular;  Laterality: Right;  . CARDIAC CATHETERIZATION    . CATARACT EXTRACTION W/PHACO Left 01/26/2016   Procedure: CATARACT EXTRACTION PHACO AND INTRAOCULAR LENS PLACEMENT (IOC) LEFT EYE;  Surgeon: Leandrew Koyanagi, MD;  Location: [place];  Service: Ophthalmology;  Laterality: Left;  . CORONARY ARTERY BYPASS GRAFT  04/07/2002   [facility]  . Cysto Bladder Botox Injection  07/02/2014  . CYSTOSCOPY W/ RETROGRADES Right 03/16/2017   Procedure: CYSTOSCOPY WITH RETROGRADE PYELOGRAM;  Surgeon: [name], MD;  Location: [facility];  Service: Urology;  Laterality: Right;  . CYSTOSCOPY WITH STENT PLACEMENT Right 03/16/2017   Procedure: CYSTOSCOPY WITH STENT PLACEMENT;  Surgeon: [name], MD;  Location: [facility];  Service: Urology;  Laterality: Right;  . EYE SURGERY Left    blood clot behind left eye  . HAND SURGERY Bilateral   . HERNIA REPAIR Right    inguinal hernia repair  . LEFT HEART  CATH AND CORS/GRAFTS ANGIOGRAPHY N/A 04/25/2017   Procedure: Left Heart Cath and Cors/Grafts Angiography;  Surgeon: Isaias Cowman, MD;  Location: [place] [facility];  Service: Cardiovascular;  Laterality: N/A;  . PORTA CATH INSERTION N/A 05/30/2017   Procedure: Glori Luis Cath Insertion;  Surgeon: Katha Cabal, MD;  Location: [place] [facility];  Service: Cardiovascular;  Laterality: N/A;  . ROBOT ASSITED LAPAROSCOPIC NEPHROURETERECTOMY Right 05/02/2017   Procedure: ROBOT ASSITED LAPAROSCOPIC NEPHROURETERECTOMY ATTEMPTED;  Surgeon: [name], MD;  Location: [facility];  Service: Urology;  Laterality: Right;  . URETEROSCOPY Right 03/16/2017   Procedure: URETEROSCOPY BIOPSY RENAL MASS;  Surgeon: [name], MD;  Location: [facility];  Service: Urology;  Laterality: Right;    SOCIAL HISTORY: Social History   Social History  . Marital status: Married    Spouse name: N/A  . Number of children: N/A  . Years of education: N/A   Occupational History  . Not on file.   Social History Main Topics  . Smoking status: Former Smoker    Types: Cigarettes    Quit date: 04/07/2002  . Smokeless tobacco: Former Systems developer    Types: Snuff, Chew  . Alcohol use No  . Drug use: No  . Sexual activity: Not on file   Other Topics Concern  . Not on file   Social History Narrative  . No narrative on file    FAMILY HISTORY Family History  Problem Relation Age of Onset  . Heart attack Mother   . Heart disease Father   . Prostate cancer Neg Hx   . Bladder Cancer Neg Hx   . Kidney cancer Neg Hx     ALLERGIES:  has No Known Allergies.  MEDICATIONS:  Current Outpatient Prescriptions  Medication Sig Dispense Refill  . aspirin EC 81 MG tablet Take 81 mg by mouth daily.    Marland Kitchen buPROPion (WELLBUTRIN SR) 100 MG 12 hr tablet TAKE ONE TABLET BY MOUTH EVERY DAY    . ezetimibe-simvastatin (VYTORIN) 10-40 MG tablet Take 1 tablet by mouth at bedtime.    . feeding supplement, ENSURE ENLIVE,  (ENSURE ENLIVE) LIQD Take 237 mLs by mouth 2 (two) times daily between meals. 60 Bottle 0  . fentaNYL (DURAGESIC - DOSED MCG/HR) 25 MCG/HR patch Place 1 patch (25 mcg total) onto the skin every 3 (three) days. 10 patch 0  . HYDROcodone-acetaminophen (NORCO) 7.5-325 MG tablet Take 1 tablet by mouth every 4 (four) hours as needed for moderate pain. 180 tablet 0  . 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    Marland Kitchen ketoconazole (NIZORAL) 2 % shampoo APPLY SHAMPOO TO HAIR AS NEEDED FOR FLAKY/ITCHY SCALP (TYPICALLY EVERY 2-3 DAYS)    . levothyroxine (SYNTHROID, LEVOTHROID) 125 MCG tablet Take 1 tablet (125 mcg total) by mouth daily before breakfast. 30 tablet 0  . lidocaine (LIDODERM) 5 % Place 1 patch onto the skin every 12 (twelve) hours. Remove &  Discard patch within 12 hours or as directed by MD 4 patch 11  . Magnesium Oxide 400 MG CAPS Take 1 capsule (400 mg total) by mouth daily. 30 capsule 0  . Methylcellulose, Laxative, (CITRUCEL PO) Take 1 tablet by mouth daily after breakfast.    . morphine (MS CONTIN) 30 MG 12 hr tablet Take 2 tablets (60 mg total) by mouth every 12 (twelve) hours. 120 tablet 0  . Multiple Vitamin (MULTIVITAMIN WITH MINERALS) TABS tablet Take 1 tablet by mouth daily after breakfast. CENTRUM SILVER    . polyethylene glycol powder (GLYCOLAX/MIRALAX) powder Take 17 g by mouth daily after breakfast. Mix with glass of water 255 g 1  . predniSONE (DELTASONE) 10 MG tablet Take 1 tablet (10 mg total) by mouth daily with breakfast. 30 tablet 0  . Probiotic Product (PROBIOTIC PO) Take 1 capsule by mouth daily as needed (for digestive health (see instructions)). AFTER BREAKFAST EITHER TAKE A PROBIOTIC OR EAT YOGURT    . promethazine (PHENERGAN) 25 MG suppository Place 1 suppository (25 mg total) rectally every 6 (six) hours as needed for nausea. 30 suppository 0  . promethazine  (PHENERGAN) 25 MG tablet Take 1 tablet (25 mg total) by mouth every 6 (six) hours as needed for nausea or vomiting. 30 tablet 0  . RABEprazole (ACIPHEX) 20 MG tablet Take 20 mg by mouth daily before breakfast.     . ramipril (ALTACE) 10 MG capsule Take 10 mg by mouth daily after breakfast.    . SENNA LAX 8.6 MG tablet TAKE TWO TABLETS BY MOUTH DAILY 120 tablet 3  . sertraline (ZOLOFT) 100 MG tablet Take 100 mg by mouth daily after breakfast.    . tamsulosin (FLOMAX) 0.4 MG CAPS capsule Take 0.8 mg by mouth daily after supper.     Marland Kitchen acetaminophen (TYLENOL) 500 MG tablet Take 500 mg by mouth 2 (two) times daily as needed (for pain.).     Marland Kitchen cyclobenzaprine (FLEXERIL) 10 MG tablet Take 10 mg by mouth at bedtime as needed for muscle spasms.    Marland Kitchen LORazepam (ATIVAN) 0.5 MG tablet Take 1 tablet (0.5 mg total) by mouth every 8 (eight) hours as needed for anxiety (nausea). (Patient not taking: Reported on 07/27/2017) 30 tablet 0  . PEPPERMINT OIL PO Take 1 capsule by mouth 3 (three) times daily as needed (for IBS). IBgard (active ingredient: 90 mg ultrapurified peppermint oil)    . sodium phosphate (FLEET) 7-19 GM/118ML ENEM Place 133 mLs (1 enema total) rectally daily as needed for severe constipation. (Patient not taking: Reported on 07/27/2017) 5 enema 0   No current facility-administered medications for this visit.    Facility-Administered Medications Ordered in Other Visits  Medication Dose Route Frequency Provider Last Rate Last Dose  . heparin lock flush 100 unit/mL  500 Units Intravenous Once Earlie Server, MD      . sodium chloride flush (NS) 0.9 % injection 10 mL  10 mL Intravenous PRN Earlie Server, MD   10 mL at 07/03/17 0930    PHYSICAL EXAMINATION:  ECOG PERFORMANCE STATUS: 2 - Symptomatic, <50% confined to bed  Vitals:   07/27/17 1053  BP: 94/65  Pulse: (!) 105  Resp: 18  Temp: (!) 95.6 F (35.3 C)    Filed Weights   07/27/17 1053  Weight: 136 lb 9.2 oz (62 kg)     Physical Exam   GENERAL: No distress, well nourished.  SKIN:  No rashes or significant lesions  HEAD: Normocephalic, No masses, lesions,  tenderness or abnormalities  EYES: Conjunctiva are pink, non icteric ENT: External ears normal ,lips , buccal mucosa, and tongue normal and mucous membranes are moist  LYMPH: No palpable cervical and axillary lymphadenopathy  LUNGS: Clear to auscultation, no crackles or wheezes HEART: Regular rate & rhythm, no murmurs, no gallops, S1 normal and S2 normal  ABDOMEN: Abdomen soft, non-tender, normal bowel sounds, I did not appreciate any  masses or organomegaly  MUSCULOSKELETAL: No CVA tenderness and no tenderness on percussion of the back or rib cage.  EXTREMITIES: No edema, no skin discoloration or tenderness NEURO: Alert & oriented, lower extremity strength 3/5 bilaterllay. Normal upper extremity strength.   LABORATORY DATA: I have personally reviewed the data as listed: CBC    Component Value Date/Time   WBC 6.5 07/27/2017 1013   RBC 3.61 (L) 07/27/2017 1013   HGB 10.6 (L) 07/27/2017 1013   HGB 15.3 11/15/2012 1641   HCT 31.8 (L) 07/27/2017 1013   HCT 43.7 11/15/2012 1641   PLT 217 07/27/2017 1013   PLT 166 11/15/2012 1641   MCV 88.2 07/27/2017 1013   MCV 89 11/15/2012 1641   MCH 29.3 07/27/2017 1013   MCHC 33.3 07/27/2017 1013   RDW 23.4 (H) 07/27/2017 1013   RDW 13.3 11/15/2012 1641   LYMPHSABS 0.9 (L) 07/27/2017 1013   LYMPHSABS 1.7 11/15/2012 1641   MONOABS 0.7 07/27/2017 1013   MONOABS 0.8 11/15/2012 1641   EOSABS 0.1 07/27/2017 1013   EOSABS 0.1 11/15/2012 1641   BASOSABS 0.0 07/27/2017 1013   BASOSABS 0.0 11/15/2012 1641   CMP Latest Ref Rng & Units 07/27/2017 07/10/2017 07/08/2017  Glucose 65 - 99 mg/dL 113(H) 80 80  BUN 6 - 20 mg/dL 16 10 16   Creatinine 0.61 - 1.24 mg/dL 1.13 0.96 1.27(H)  Sodium 135 - 145 mmol/L 132(L) 133(L) 139  Potassium 3.5 - 5.1 mmol/L 4.3 3.8 3.5  Chloride 101 - 111 mmol/L 96(L) 98(L) 100(L)  CO2 22 - 32 mmol/L 26  27 26   Calcium 8.9 - 10.3 mg/dL 9.2 8.7(L) 8.3(L)  Total Protein 6.5 - 8.1 g/dL 6.9 - -  Total Bilirubin 0.3 - 1.2 mg/dL 0.4 - -  Alkaline Phos 38 - 126 U/L 104 - -  AST 15 - 41 U/L 27 - -  ALT 17 - 63 U/L 31 - -     RADIOGRAPHIC STUDIES: I have personally reviewed the radiological images as listed and agree with the findings in the report 5.  Aortic Atherosclerosis (ICD10-I70.0).  Coronary atherosclerosis. 6. Inflammatory stranding from the right perirenal space extends down into the pelvis and crosses the midline. There is presacral edema. 7. 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 Recommend followup by ultrasound in 3 years. This recommendation follows ACR consensus guidelines: White Paper of the ACR Incidental Findings Committee II on Vascular Findings. J Am Coll Radiol 2013; 14:970-263 8. Wall thickening along the left side of the urinary bladder may be incidental, but synchronous sessile transitional cell carcinoma is not readily excluded.  CT abdomen wo contrast 02/09/2017  Increased size of 4.6 cm ill-defined hypoenhancing mass ininterpolar region of right kidney, with obstruction of upper pole collecting system. This does not have typical appearance for pyelonephritis or renal cell carcinoma, and raises suspicion for urothelial carcinoma. Consider ureteroscopy for further evaluation. No evidence of metastatic disease. Stable mildly enlarged prostate and mild diffuse bladder wall thickening. Stable 3.0 cm infrarenal abdominal aortic aneurysm. Recommend followup by ultrasound in 3 years. This recommendation follows ACR consensus  guidelines: White Paper of the ACR Incidental Findings Committee II on Vascular Findings. [place] [phone]. Colonic diverticulosis. No radiographic evidence of diverticulitis. Stable small fat-containing left inguinal hernia. MRI thoracic and lumbar w/wo contrast.  1. Malignant invasion of the L2 vertebral  body by the adjacent retroperitoneal mass. No pathologic fracture or osseous metastatic disease elsewhere in the thoracolumbar spine. 2. Areas of epidural contrast enhancement of the lumbar spine are most consistent with a dilated epidural venous plexus secondary to invasion of the inferior vena cava by the patient's urothelial tumor. 3. Moderate L4-L5 multifactorial spinal canal stenosis with severe right and mild-to-moderate left neural foraminal stenosis. 4. Moderate right and severe left multifactorial L5-S1 neural foraminal stenosis. There is also severe attenuation of the thecal sac at this level but no bony spinal canal stenosis. 5. No spinal canal or neural foraminal stenosis in the thoracic spine.   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  Cancer Staging Transitional cell carcinoma of kidney, right [facility]) Staging form: Kidney, AJCC 8th Edition - Clinical stage from 05/08/2017: Stage IV (cT3, cNX, cM1) - Signed by Earlie Server, MD on 05/08/2017  1. Encounter for antineoplastic chemotherapy   2. Hypothyroidism, unspecified type   3. Dehydration   4. 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 Transitional cell carcinoma of kidney, right ([place])   6. Goals of care, counseling/discussion    1 I had a lengthy discussion with patient today. He and his wife is interested are interested in immunotherapy. They understand that patient's prognosis is very poor. And they understand that the chance of immunotherapy be effective is low. Although his PDL 1 is 0% and chances of responding to immunotherapy is slim, FDA approved using of concentric as a second line setting regardless of PDL 1 level.  I discussed the mechanism of action; The goal of therapy is palliative; and length of  treatments are likely ongoing/based upon the results of the scans. Discussed the potential side effects of immunotherapy including but not limited to diarrhea; skin rash; elevated LFTs/endocrine abnormalities etc.Paient agrees with treatment  and want to proceed with immunotherapy.   Will proceed with Cycle 1 Tecentriq today.   #Neoplasm related pain: Reviewed his pain regimen with patient's wife in details. He has been on both Fentanyle patch and MS Contin with the dose being doubled recently. I advise patient to discontinue fentanyl patch and continue MS contin 95m BID with PRN Norco.   Continue low dose prednisone 164mdailyto palliate the bone pain, refilled.   Follow up with Dr.Chrystal to see if additional RT is feasible at L2.   #  Constipation: continue current bowel regimen   # I was planning to start Xgeva Q28 days for metastatic bone disease, however Patient has poor dentition. Patient reports that he hasn't see the dental evaluation yet and may not be able to afford it. His considering about taking bone strengthening treatment with risk of osteoporosis. He also requests that he start with one thing at a time. We'll hold Xgeva and will revisit this problem at next visit.  #He has hypothyroidism and her last TSH was in 2016sWe will recheck TSH today. He has been on chronic thyroid supplementation. It was considered that due to nausea and vomiting during the chemotherapy, he is not getting adequate supplementation.  Now that he is off chemotherapy would anticipate TSH to be repleted in the near future. Will continue to titrate.   Follow up on 11/12  Earlie Server, MD  07/27/2017 10:58 AM

## 2017-07-27 NOTE — Progress Notes (Signed)
Pt weak. He does not have a appetite.

## 2017-07-27 NOTE — Progress Notes (Signed)
Radiation Oncology Follow up Note  Name: Joseph Ritter   Date:   07/27/2017 MRN:  [id number] DOB: January 17, 1941    This 76 y.o. male presents to the clinic today for one-month follow-up status post palliative radiation therapy to lumbar spine for metastatic transitional cell carcinoma of the kidney  REFERRING PROVIDER: [name], MD  HPI: Patient is a 76 year old male now one month out having completed palliative radiation therapy to his lumbar spine for metastatic involvement of transitional cell carcinoma of the kidney. He is seen in routine follow-up and is doing well pain is certainly improved.Marland Kitchen He is also being seen by medical oncology today to start first cycle of gemcitabine and carboplatinum. His PDL 1 expression was 0% not eligible for frontline immunotherapy.  COMPLICATIONS OF TREATMENT: none  FOLLOW UP COMPLIANCE: keeps appointments   PHYSICAL EXAM:  BP 94/65   Pulse (!) 105   Temp (!) 95.6 F (35.3 C)   Resp 18   Wt 136 lb 9.2 oz (62 kg)   BMI 21.39 kg/m  Well-developed frail-appearing male wheelchair-bound in NAD. Range of motion of his lower tremors does not elicit pain motor and sensory levels of his lower extremities are equal and symmetric. Proprioception is intact. Well-developed well-nourished patient in NAD. HEENT reveals PERLA, EOMI, discs not visualized.  Oral cavity is clear. No oral mucosal lesions are identified. Neck is clear without evidence of cervical or supraclavicular adenopathy. Lungs are clear to A&P. Cardiac examination is essentially unremarkable with regular rate and rhythm without murmur rub or thrill. Abdomen is benign with no organomegaly or masses noted. Motor sensory and DTR levels are equal and symmetric in the upper and lower extremities. Cranial nerves II through XII are grossly intact. Proprioception is intact. No peripheral adenopathy or edema is identified. No motor or sensory levels are noted. Crude visual fields are within normal  range.  RADIOLOGY RESULTS: No current films for review  PLAN: At the present time I'll turn follow-up care over to medical oncology. He will start gemcitabine carboplatinum treatments under their direction. I would be happy to reevaluate him at any time should further palliative treatment be indicated.  I would like to take this opportunity to thank you for allowing me to participate in the care of your patient.Armstead Peaks., MD

## 2017-07-30 ENCOUNTER — Telehealth: Payer: Self-pay | Admitting: *Deleted

## 2017-07-30 NOTE — Telephone Encounter (Signed)
Asking if he needed Zofran refilled. He has Phenergan, The Zofran has been removed from his medicine list and he has Phenergan tabs and suppositories on hand. I told her that if the Phenergan does not control nausea to call back

## 2017-08-01 NOTE — Telephone Encounter (Signed)
Per Elane Fritz at [facility], Morphine 30mg , 2 tablets BID has been approved.

## 2017-08-06 ENCOUNTER — Telehealth: Payer: Self-pay | Admitting: Oncology

## 2017-08-06 ENCOUNTER — Other Ambulatory Visit: Payer: Self-pay

## 2017-08-06 ENCOUNTER — Other Ambulatory Visit: Payer: Self-pay | Admitting: Oncology

## 2017-08-06 ENCOUNTER — Inpatient Hospital Stay: Payer: Medicare Other

## 2017-08-06 ENCOUNTER — Encounter: Payer: Self-pay | Admitting: Oncology

## 2017-08-06 ENCOUNTER — Inpatient Hospital Stay (HOSPITAL_BASED_OUTPATIENT_CLINIC_OR_DEPARTMENT_OTHER): Payer: Medicare Other | Admitting: Oncology

## 2017-08-06 VITALS — BP 185/62 | HR 56 | Temp 95.1°F | Resp 18 | Wt 136.5 lb

## 2017-08-06 DIAGNOSIS — I2510 Atherosclerotic heart disease of native coronary artery without angina pectoris: Secondary | ICD-10-CM

## 2017-08-06 DIAGNOSIS — C641 Malignant neoplasm of right kidney, except renal pelvis: Secondary | ICD-10-CM | POA: Diagnosis not present

## 2017-08-06 DIAGNOSIS — Z905 Acquired absence of kidney: Secondary | ICD-10-CM

## 2017-08-06 DIAGNOSIS — Z7901 Long term (current) use of anticoagulants: Secondary | ICD-10-CM

## 2017-08-06 DIAGNOSIS — C679 Malignant neoplasm of bladder, unspecified: Secondary | ICD-10-CM | POA: Diagnosis not present

## 2017-08-06 DIAGNOSIS — J449 Chronic obstructive pulmonary disease, unspecified: Secondary | ICD-10-CM

## 2017-08-06 DIAGNOSIS — R140 Abdominal distension (gaseous): Secondary | ICD-10-CM

## 2017-08-06 DIAGNOSIS — R112 Nausea with vomiting, unspecified: Secondary | ICD-10-CM

## 2017-08-06 DIAGNOSIS — N400 Enlarged prostate without lower urinary tract symptoms: Secondary | ICD-10-CM

## 2017-08-06 DIAGNOSIS — C7951 Secondary malignant neoplasm of bone: Secondary | ICD-10-CM

## 2017-08-06 DIAGNOSIS — G893 Neoplasm related pain (acute) (chronic): Secondary | ICD-10-CM | POA: Diagnosis not present

## 2017-08-06 DIAGNOSIS — I509 Heart failure, unspecified: Secondary | ICD-10-CM

## 2017-08-06 DIAGNOSIS — E039 Hypothyroidism, unspecified: Secondary | ICD-10-CM

## 2017-08-06 DIAGNOSIS — R7303 Prediabetes: Secondary | ICD-10-CM

## 2017-08-06 DIAGNOSIS — D696 Thrombocytopenia, unspecified: Secondary | ICD-10-CM | POA: Diagnosis not present

## 2017-08-06 DIAGNOSIS — N3281 Overactive bladder: Secondary | ICD-10-CM

## 2017-08-06 DIAGNOSIS — Z903 Acquired absence of stomach [part of]: Secondary | ICD-10-CM

## 2017-08-06 DIAGNOSIS — M549 Dorsalgia, unspecified: Secondary | ICD-10-CM

## 2017-08-06 DIAGNOSIS — R5383 Other fatigue: Secondary | ICD-10-CM

## 2017-08-06 DIAGNOSIS — N183 Chronic kidney disease, stage 3 (moderate): Secondary | ICD-10-CM

## 2017-08-06 DIAGNOSIS — Z79899 Other long term (current) drug therapy: Secondary | ICD-10-CM

## 2017-08-06 DIAGNOSIS — E785 Hyperlipidemia, unspecified: Secondary | ICD-10-CM

## 2017-08-06 DIAGNOSIS — Z7982 Long term (current) use of aspirin: Secondary | ICD-10-CM

## 2017-08-06 DIAGNOSIS — N319 Neuromuscular dysfunction of bladder, unspecified: Secondary | ICD-10-CM | POA: Diagnosis not present

## 2017-08-06 DIAGNOSIS — C651 Malignant neoplasm of right renal pelvis: Secondary | ICD-10-CM | POA: Diagnosis not present

## 2017-08-06 DIAGNOSIS — Z87891 Personal history of nicotine dependence: Secondary | ICD-10-CM

## 2017-08-06 DIAGNOSIS — T402X5S Adverse effect of other opioids, sequela: Secondary | ICD-10-CM

## 2017-08-06 DIAGNOSIS — K5903 Drug induced constipation: Secondary | ICD-10-CM

## 2017-08-06 DIAGNOSIS — R530 Neoplastic (malignant) related fatigue: Secondary | ICD-10-CM | POA: Diagnosis not present

## 2017-08-06 DIAGNOSIS — R109 Unspecified abdominal pain: Secondary | ICD-10-CM

## 2017-08-06 DIAGNOSIS — Z5112 Encounter for antineoplastic immunotherapy: Secondary | ICD-10-CM | POA: Diagnosis not present

## 2017-08-06 DIAGNOSIS — Z7952 Long term (current) use of systemic steroids: Secondary | ICD-10-CM

## 2017-08-06 DIAGNOSIS — M5136 Other intervertebral disc degeneration, lumbar region: Secondary | ICD-10-CM

## 2017-08-06 DIAGNOSIS — Z9221 Personal history of antineoplastic chemotherapy: Secondary | ICD-10-CM

## 2017-08-06 LAB — CBC WITH DIFFERENTIAL/PLATELET
BASOS ABS: 0 10*3/uL (ref 0–0.1)
BASOS PCT: 1 %
EOS ABS: 0.1 10*3/uL (ref 0–0.7)
EOS PCT: 2 %
HCT: 34.8 % — ABNORMAL LOW (ref 40.0–52.0)
Hemoglobin: 11.5 g/dL — ABNORMAL LOW (ref 13.0–18.0)
Lymphocytes Relative: 13 %
Lymphs Abs: 0.9 10*3/uL — ABNORMAL LOW (ref 1.0–3.6)
MCH: 30.1 pg (ref 26.0–34.0)
MCHC: 33 g/dL (ref 32.0–36.0)
MCV: 91 fL (ref 80.0–100.0)
MONO ABS: 0.5 10*3/uL (ref 0.2–1.0)
Monocytes Relative: 8 %
Neutro Abs: 5.5 10*3/uL (ref 1.4–6.5)
Neutrophils Relative %: 76 %
PLATELETS: 203 10*3/uL (ref 150–440)
RBC: 3.83 MIL/uL — AB (ref 4.40–5.90)
RDW: 22.9 % — AB (ref 11.5–14.5)
WBC: 7.1 10*3/uL (ref 3.8–10.6)

## 2017-08-06 LAB — COMPREHENSIVE METABOLIC PANEL
ALT: 55 U/L (ref 17–63)
AST: 37 U/L (ref 15–41)
Albumin: 3.5 g/dL (ref 3.5–5.0)
Alkaline Phosphatase: 112 U/L (ref 38–126)
Anion gap: 15 (ref 5–15)
BUN: 18 mg/dL (ref 6–20)
CHLORIDE: 94 mmol/L — AB (ref 101–111)
CO2: 26 mmol/L (ref 22–32)
Calcium: 9.8 mg/dL (ref 8.9–10.3)
Creatinine, Ser: 0.96 mg/dL (ref 0.61–1.24)
GFR calc Af Amer: >60 mL/min (ref >60)
Glucose, Bld: 177 mg/dL — ABNORMAL HIGH (ref 65–99)
POTASSIUM: 4.4 mmol/L (ref 3.5–5.1)
SODIUM: 135 mmol/L (ref 135–145)
Total Bilirubin: 0.5 mg/dL (ref 0.3–1.2)
Total Protein: 7.5 g/dL (ref 6.5–8.1)

## 2017-08-06 LAB — TSH: TSH: 10.407 u[IU]/mL — AB (ref 0.350–4.500)

## 2017-08-06 MED ORDER — DENOSUMAB 120 MG/1.7ML ~~LOC~~ SOLN
120.0000 mg | Freq: Once | SUBCUTANEOUS | Status: AC
  Administered 2017-08-06: 120 mg via SUBCUTANEOUS
  Filled 2017-08-06: qty 1.7

## 2017-08-06 MED ORDER — SODIUM CHLORIDE 0.9 % IV SOLN: Freq: Once | INTRAVENOUS | Status: DC

## 2017-08-06 MED ORDER — HEPARIN SOD (PORK) LOCK FLUSH 100 UNIT/ML IV SOLN
500.0000 [IU] | Freq: Once | INTRAVENOUS | Status: AC
  Administered 2017-08-06: 500 [IU] via INTRAVENOUS
  Filled 2017-08-06: qty 5

## 2017-08-06 MED ORDER — ONDANSETRON HCL 40 MG/20ML IJ SOLN: Freq: Once | INTRAMUSCULAR | Status: DC

## 2017-08-06 MED ORDER — SODIUM CHLORIDE 0.9% FLUSH
10.0000 mL | Freq: Once | INTRAVENOUS | Status: AC
  Administered 2017-08-06: 10 mL via INTRAVENOUS
  Filled 2017-08-06: qty 10

## 2017-08-06 MED ORDER — SODIUM CHLORIDE 0.9 % IV SOLN
Freq: Once | INTRAVENOUS | Status: AC
  Administered 2017-08-06: 12:00:00 via INTRAVENOUS
  Filled 2017-08-06: qty 2

## 2017-08-06 NOTE — Progress Notes (Signed)
[facility] Follow Up visit  Patient Care Team: [name], MD as PCP - General (Physician Assistant)  REASON FOR VISIT Treatment of Locally advanced kidney urothelial cancer  ONCOLOGY HISTORY Joseph Ritter 76 y.o. male with PMH listed as below  is referred to [place] for evaluation of locally advanced kidney urothelia cancer. Patient developed gross hematuria at the end of 2017. CT 08/2016 showed endophytic hypodense mass. Repeat image 01/2017 showed enlarged  endophytic hypodense mass right kidney. 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 Right robotic nephroureterectomy was attempted but aborted due to unresectable newly metastatic disease diagnosed intraoperatively. Postop he underwent a CT scan of the abdomen and pelvis which showed extension of an infiltrative mass from the right kidney along the right renal vein toward the IVC with encasement of the IVC.  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 He self catheterize a few time a day.  He reports having back pain which limits her ability to walk. Also constipated and abdominal bloating. His appetite is fair. He takes Norco for abdominal and back pain.  He can walk with walker for very short distance, mostly limited by back pain and fatigue./weakness.  # Images: CT chest and abdomen 05/03/2017 1. Pneumoperitoneum with a small amount of ascites, gas tracking along the thoracoabdominal wall into the scrotum, and a small amount of gas in the right perirenal space-these findings are likely related to the patient's attempted nephroureterectomy yesterday. Surveillance to ensure expected resolution of the pneumoperitoneum might be appropriate. 2. The right kidney lower pole infarct with lack of patency of the more inferior of the 3 right renal arteries. There is delayed nephrogram on the right with increase an infiltrative process in theright  kidney and extending into the right renal hilar vascular structures, including the right renal vein and the adjacent IVC, compatible with tumor thrombus. There is also soft tissue density surrounding the renal hilar vascular structures and extending into the retroperitoneum and periaortic region, some of which may be tumor and some of which may be hematoma.3. Demineralization of the anterior inferior L2 vertebral body,concerning for tumor invasion. This is near the level of the retroaortic left renal vein. 4. No findings of metastatic disease to the chest. There are new trace bilateral pleural effusions with mild passive atelectasis.  #Pathology: 05/02/2017 SPECIMEN SUBMITTED: A. Hilar tissue, right kidney B. Hilar tissue, right kidney DIAGNOSIS:  A and B. SOFT TISSUE, RIGHT RENAL HILUM  PD-L1 - Urothelial (TECENTRIQ) Immunohistochemistry Analysis  Result: 0% Interpretation: No expression   # Current Treatment: 1 Palliative RT to spine 2 Chemotherapy:     06/01/2017 Cycle 1 Gemcitabine and Carboplain.  Day 8 Gemcitabine was delayed due to intractable nausea and vomiting.     Chemotherapy related thrombocytopenia, no active bleeding. 50% dose of Gemcitabine was given on day 11.     06/25/2017 Cycle 2 Day 1 Gemcitabine and Carboplain.    07/03/2017 Cycle 2 Day 8 chemotherapy discontinued.      3 Immunotherapy:     07/27/2017 cycle 1 of Atezolizumab,     08/20/2017 cycle 2 of Atezolizumab planned     INTERVAL HISTORY 76 yo male who has above oncology history reviewed by me today presents for evaluation for immunotherapy. Reports feeling well until this morning, on the way to cancer center, patient felt nausea and vomited. He feels less fatigued. Still have back pain, partially relieved by pain medication.  Review of Systems  Constitutional: Positive for fatigue. Negative for chills and fever.  HENT:   Positive for lump/mass.   Respiratory: Negative for shortness of breath.   Cardiovascular:  Negative for leg swelling.  Gastrointestinal: Positive for abdominal pain, nausea and vomiting. Negative for blood in stool.  Genitourinary: Positive for difficulty urinating. Negative for dysuria and hematuria.   Musculoskeletal: Positive for back pain.  Skin: Negative.  Negative for itching.     MEDICAL HISTORY: Past Medical History:  Diagnosis Date  . Acid indigestion 03/09/2014  . Adult hypothyroidism 11/18/2012  . Anxiety   . Arteriosclerosis of coronary artery 11/18/2012   Overview:  PATENT LIMA TO LAD, PATENT SVG TO PDA AND OCCULUDED SVG TO OM2 BY CATHERIZATION 02/09/2009   . 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 Borderline diabetes 05/25/2014  . BP (high blood pressure) 11/18/2012  . BPH (benign prostatic hypertrophy)   . CAFL (chronic airflow limitation) ([place]) 05/03/2015  . Cancer ([place])    renal cancer with mets  . CCF (congestive cardiac failure) ([place]) 01/03/2014   Overview:  HX OF   . Clinical depression 05/03/2015  . COPD (chronic obstructive pulmonary disease) ([place])   . DDD (degenerative disc disease), lumbar 06/29/2014  . 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 Gastric ulcer 03/09/2014  . 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 History of urinary self-catheterization 2017  . HLD (hyperlipidemia) 01/03/2014  . Incomplete bladder emptying 11/18/2012  . Leg weakness 11/18/2012  . Lumbar radiculitis   . Neuritis or radiculitis due to rupture of lumbar intervertebral disc 06/29/2014  . Overactive bladder   . PONV (postoperative nausea and vomiting)    years ago with Ether, no problem with Nausea or vomiting with the last few surgerys  . Pre-diabetes   . Subacute transverse myelitis ([place]) 11/21/2012  . Teeth problem    pt reports "bad teeth", "need to be pulled"    SURGICAL HISTORY: Past Surgical History:  Procedure Laterality Date  . CARDIAC  CATHETERIZATION    . CORONARY ARTERY BYPASS GRAFT  04/07/2002   [facility]  . Cysto Bladder Botox Injection  07/02/2014  . EYE SURGERY Left    blood clot behind left eye  . HAND SURGERY Bilateral   . HERNIA REPAIR Right    inguinal hernia repair    SOCIAL HISTORY: Social History   Socioeconomic History  . Marital status: Married    Spouse name: Not on file  . Number of children: Not on file  . Years of education: Not on file  . Highest education level: Not on file  Social Needs  . Financial resource strain: Not on file  . Food insecurity - worry: Not on file  . Food insecurity - inability: Not on file  . Transportation needs - medical: Not on file  . Transportation needs - non-medical: Not on file  Occupational History  . Not on file  Tobacco Use  . Smoking status: Former Smoker    Types: Cigarettes    Last attempt to quit: 04/07/2002    Years since quitting: 15.3  . Smokeless tobacco: Former Systems developer    Types: Snuff, Chew  Substance and Sexual Activity  . Alcohol use: No    Alcohol/week: 0.0 oz  . Drug use: No  . Sexual activity: Not on file  Other Topics Concern  . Not on  file  Social History Narrative  . Not on file    FAMILY HISTORY Family History  Problem Relation Age of Onset  . Heart attack Mother   . Heart disease Father   . Prostate cancer Neg Hx   . Bladder Cancer Neg Hx   . Kidney cancer Neg Hx     ALLERGIES:  has No Known Allergies.  MEDICATIONS:  Current Outpatient Medications  Medication Sig Dispense Refill  . apixaban (ELIQUIS) 5 MG TABS tablet Take 1 tablet (5 mg total) by mouth 2 (two) times daily. 60 tablet 0  . buPROPion (WELLBUTRIN SR) 100 MG 12 hr tablet TAKE ONE TABLET BY MOUTH EVERY DAY    . ezetimibe-simvastatin (VYTORIN) 10-40 MG tablet Take 1 tablet by mouth at bedtime.    . feeding supplement, ENSURE ENLIVE, (ENSURE ENLIVE) LIQD Take 237 mLs by mouth 2 (two) times daily between meals. 60 Bottle 0  . HYDROcodone-acetaminophen (NORCO)  7.5-325 MG tablet Take 1 tablet by mouth every 4 (four) hours as needed for moderate pain. 180 tablet 0  . lidocaine (LIDODERM) 5 % Place 1 patch onto the skin every 12 (twelve) hours. Remove & Discard patch within 12 hours or as directed by MD 60 patch 11  . Methylcellulose, Laxative, (CITRUCEL PO) Take 1 tablet by mouth daily after breakfast.    . morphine (MS CONTIN) 30 MG 12 hr tablet Take 2 tablets (60 mg total) by mouth every 12 (twelve) hours. 120 tablet 0  . Multiple Vitamin (MULTIVITAMIN WITH MINERALS) TABS tablet Take 1 tablet by mouth daily after breakfast. CENTRUM SILVER    . polyethylene glycol powder (GLYCOLAX/MIRALAX) powder Take 17 g by mouth daily after breakfast. Mix with glass of water 255 g 1  . predniSONE (DELTASONE) 10 MG tablet Take 1 tablet (10 mg total) by mouth daily with breakfast. 30 tablet 0  . promethazine (PHENERGAN) 25 MG tablet Take 1 tablet (25 mg total) by mouth every 6 (six) hours as needed for nausea or vomiting. 30 tablet 0  . RABEprazole (ACIPHEX) 20 MG tablet Take 20 mg by mouth daily before breakfast.     . ramipril (ALTACE) 10 MG capsule Take 10 mg by mouth daily after breakfast.    . SENNA LAX 8.6 MG tablet TAKE TWO TABLETS BY MOUTH DAILY 120 tablet 3  . sertraline (ZOLOFT) 100 MG tablet Take 100 mg by mouth daily after breakfast.    . tamsulosin (FLOMAX) 0.4 MG CAPS capsule Take 0.8 mg by mouth daily after supper.     Marland Kitchen aspirin EC 81 MG tablet Take 81 mg by mouth daily.    . cyclobenzaprine (FLEXERIL) 10 MG tablet Take 10 mg by mouth at bedtime as needed for muscle spasms.    . 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    Marland Kitchen ketoconazole (NIZORAL) 2 % shampoo APPLY SHAMPOO TO HAIR AS NEEDED FOR FLAKY/ITCHY SCALP (TYPICALLY EVERY 2-3 DAYS)    . levothyroxine (SYNTHROID, LEVOTHROID) 125 MCG tablet TAKE ONE TABLET BY MOUTH EVERY DAY BEFORE  BREAKFAST 30 tablet 0  . LORazepam (ATIVAN) 0.5 MG tablet Take 1 tablet (0.5 mg total) by mouth every 8 (eight) hours as needed for anxiety (nausea). (Patient not taking: Reported on 07/27/2017) 30 tablet 0  . nystatin (MYCOSTATIN) 100000 UNIT/ML suspension   2  . PEPPERMINT OIL PO Take 1 capsule by mouth 3 (three) times daily as  needed (for IBS). IBgard (active ingredient: 90 mg ultrapurified peppermint oil)    . Probiotic Product (PROBIOTIC PO) Take 1 capsule by mouth daily as needed (for digestive health (see instructions)). AFTER BREAKFAST EITHER TAKE A PROBIOTIC OR EAT YOGURT    . promethazine (PHENERGAN) 25 MG suppository Place 1 suppository (25 mg total) rectally every 6 (six) hours as needed for nausea. (Patient not taking: Reported on 08/06/2017) 30 suppository 0  . sodium phosphate (FLEET) 7-19 GM/118ML ENEM Place 133 mLs (1 enema total) rectally daily as needed for severe constipation. (Patient not taking: Reported on 07/27/2017) 5 enema 0   No current facility-administered medications for this visit.    Facility-Administered Medications Ordered in Other Visits  Medication Dose Route Frequency Provider Last Rate Last Dose  . heparin lock flush 100 unit/mL  500 Units Intravenous Once Earlie Server, MD      . sodium chloride flush (NS) 0.9 % injection 10 mL  10 mL Intravenous PRN Earlie Server, MD   10 mL at 07/03/17 0930    PHYSICAL EXAMINATION:  ECOG PERFORMANCE STATUS: 2 - Symptomatic, <50% confined to bed  Vitals:   08/06/17 1021  BP: (!) 185/62  Pulse: (!) 56  Resp: 18  Temp: (!) 95.1 F (35.1 C)    Filed Weights   08/06/17 1021  Weight: 136 lb 8 oz (61.9 kg)     Physical Exam  Constitutional: He is oriented to person, place, and time. No distress.  HENT:  Head: Normocephalic.  Eyes: Conjunctivae and EOM are normal. Pupils are equal, round, and reactive to light.  Neck: Normal range of motion. Neck supple.  Cardiovascular: Normal rate and regular rhythm.  No murmur  heard. Pulmonary/Chest: No stridor. No respiratory distress. He has no wheezes.  Abdominal: There is no tenderness.  Genitourinary:  Genitourinary Comments: Self catheraization  Musculoskeletal: He exhibits no edema.  Palpable spinal tenderness.  Lymphadenopathy:    He has no cervical adenopathy.  Neurological: He is alert and oriented to person, place, and time. No cranial nerve deficit. Coordination normal.  Skin: Skin is warm. No rash noted. No erythema.  Psychiatric: Affect normal.     LABORATORY DATA: I have personally reviewed the data as listed: CBC    Component Value Date/Time   WBC 7.1 08/06/2017 0945   RBC 3.83 (L) 08/06/2017 0945   HGB 11.5 (L) 08/06/2017 0945   HGB 15.3 11/15/2012 1641   HCT 34.8 (L) 08/06/2017 0945   HCT 43.7 11/15/2012 1641   PLT 203 08/06/2017 0945   PLT 166 11/15/2012 1641   MCV 91.0 08/06/2017 0945   MCV 89 11/15/2012 1641   MCH 30.1 08/06/2017 0945   MCHC 33.0 08/06/2017 0945   RDW 22.9 (H) 08/06/2017 0945   RDW 13.3 11/15/2012 1641   LYMPHSABS 0.9 (L) 08/06/2017 0945   LYMPHSABS 1.7 11/15/2012 1641   MONOABS 0.5 08/06/2017 0945   MONOABS 0.8 11/15/2012 1641   EOSABS 0.1 08/06/2017 0945   EOSABS 0.1 11/15/2012 1641   BASOSABS 0.0 08/06/2017 0945   BASOSABS 0.0 11/15/2012 1641   CMP Latest Ref Rng & Units 08/06/2017 07/27/2017 07/10/2017  Glucose 65 - 99 mg/dL 177(H) 113(H) 80  BUN 6 - 20 mg/dL 18 16 10   Creatinine 0.61 - 1.24 mg/dL 0.96 1.13 0.96  Sodium 135 - 145 mmol/L 135 132(L) 133(L)  Potassium 3.5 - 5.1 mmol/L 4.4 4.3 3.8  Chloride 101 - 111 mmol/L 94(L) 96(L) 98(L)  CO2 22 - 32 mmol/L 26 26 27  Calcium 8.9 - 10.3 mg/dL 9.8 9.2 8.7(L)  Total Protein 6.5 - 8.1 g/dL 7.5 6.9 -  Total Bilirubin 0.3 - 1.2 mg/dL 0.5 0.4 -  Alkaline Phos 38 - 126 U/L 112 104 -  AST 15 - 41 U/L 37 27 -  ALT 17 - 63 U/L 55 31 -     RADIOGRAPHIC STUDIES: I have personally reviewed the radiological images as listed and agree with the findings in  the report 5.  Aortic Atherosclerosis (ICD10-I70.0).  Coronary atherosclerosis. 6. Inflammatory stranding from the right perirenal space extends down into the pelvis and crosses the midline. There is presacral edema. 7. 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 Recommend followup by ultrasound in 3 years. This recommendation follows ACR consensus guidelines: White Paper of the ACR Incidental Findings Committee II on Vascular Findings. J Am Coll Radiol 2013; 54:627-035 8. Wall thickening along the left side of the urinary bladder may be incidental, but synchronous sessile transitional cell carcinoma is not readily excluded.  CT abdomen wo contrast 02/09/2017  Increased size of 4.6 cm ill-defined hypoenhancing mass ininterpolar region of right kidney, with obstruction of upper pole collecting system. This does not have typical appearance for pyelonephritis or renal cell carcinoma, and raises suspicion for urothelial carcinoma. Consider ureteroscopy for further evaluation. No evidence of metastatic disease. Stable mildly enlarged prostate and mild diffuse bladder wall thickening. Stable 3.0 cm infrarenal abdominal aortic aneurysm. Recommend followup by ultrasound in 3 years. This recommendation follows ACR consensus guidelines: White Paper of the ACR Incidental Findings Committee II on Vascular Findings. [place] [phone]. Colonic diverticulosis. No radiographic evidence of diverticulitis. Stable small fat-containing left inguinal hernia. MRI thoracic and lumbar w/wo contrast.  1. Malignant invasion of the L2 vertebral body by the adjacent retroperitoneal mass. No pathologic fracture or osseous metastatic disease elsewhere in the thoracolumbar spine. 2. Areas of epidural contrast enhancement of the lumbar spine are most consistent with a dilated epidural venous plexus secondary to invasion of the inferior vena cava by the patient's urothelial tumor. 3.  Moderate L4-L5 multifactorial spinal canal stenosis with severe right and mild-to-moderate left neural foraminal stenosis. 4. Moderate right and severe left multifactorial L5-S1 neural foraminal stenosis. There is also severe attenuation of the thecal sac at this level but no bony spinal canal stenosis. 5. No spinal canal or neural foraminal stenosis in the thoracic spine.   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  Cancer Staging Transitional cell carcinoma of kidney, right [facility]) Staging form: Kidney, AJCC 8th Edition - Clinical stage from 05/08/2017: Stage IV (cT3, cNX, cM1) - Signed by Earlie Server, MD on 05/08/2017  1. 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 Transitional cell carcinoma of kidney, right ([place])   3. Hypothyroidism, unspecified type   4. Encounter for antineoplastic immunotherapy   5. Nausea and vomiting, intractability of vomiting not specified, unspecified vomiting type    # Tolerated cycle 1 Atezulizulab well. Plan next cycle on 08/20/2017.  # Nausea and vomiting: will give 76m zofran IV today. I went over patient's anti emetics with patient's wife. They have enough supply.  # Neoplasm related bone pain: continue current pain regimen. May need to titrate again at next visit.  XDelton Seewas planned previously but held as patient has poor dentition. Due to multiple admission  and general weakness, patient did not go to dentist for further evaluation. And he does not think he will tolerate any invasive dental procedure so he does not want to go. I have discussed with potential side effects including osteonecrosis, most commonly involving jaw. Patient and his wife voice understanding and would like take the risk and be treated with Xgeva.  Will give Xgeva x 1 dose, and  monthly.   #  Constipation: continue current bowel regimen   #Hypothyroidism on Synthroid 115mg daily. TSH is decreasing. Will refill today.   Follow up on 08/20/2017 Lab/MD/immnotherapy.   ZEarlie Server MD  08/06/2017 7:50 PM

## 2017-08-06 NOTE — Telephone Encounter (Signed)
Xgeva/IV Zofran addon for today, per 08/06/17 los. Auth by Olegario Shearer, per Almyra Free. Delton See schd for every 4 weeks x 4 months. Lab/MD/Chemo for 2 weeks, previously schedule by Colette. Patient given a copy of the updated aapt schd/AVS report.

## 2017-08-06 NOTE — Progress Notes (Signed)
Here for follow up .stated nausea started this am vomited x 3 this am. Has taken phenergan pill this am - helped a bit per pt

## 2017-08-07 LAB — T4: T4 TOTAL: 6.6 ug/dL (ref 4.5–12.0)

## 2017-08-09 ENCOUNTER — Ambulatory Visit: Payer: Medicare Other

## 2017-08-13 ENCOUNTER — Ambulatory Visit (INDEPENDENT_AMBULATORY_CARE_PROVIDER_SITE_OTHER): Payer: Medicare Other

## 2017-08-13 ENCOUNTER — Emergency Department
Admission: EM | Admit: 2017-08-13 | Discharge: 2017-08-13 | Disposition: A | Discharge status: Home / Self Care | Payer: Medicare Other | Attending: Emergency Medicine | Admitting: Emergency Medicine

## 2017-08-13 ENCOUNTER — Encounter: Payer: Self-pay | Admitting: *Deleted

## 2017-08-13 VITALS — BP 160/81 | HR 112 | Ht 67.0 in

## 2017-08-13 DIAGNOSIS — E039 Hypothyroidism, unspecified: Secondary | ICD-10-CM | POA: Diagnosis not present

## 2017-08-13 DIAGNOSIS — J449 Chronic obstructive pulmonary disease, unspecified: Secondary | ICD-10-CM | POA: Diagnosis not present

## 2017-08-13 DIAGNOSIS — I2510 Atherosclerotic heart disease of native coronary artery without angina pectoris: Secondary | ICD-10-CM | POA: Insufficient documentation

## 2017-08-13 DIAGNOSIS — C7951 Secondary malignant neoplasm of bone: Secondary | ICD-10-CM | POA: Insufficient documentation

## 2017-08-13 DIAGNOSIS — I130 Hypertensive heart and chronic kidney disease with heart failure and stage 1 through stage 4 chronic kidney disease, or unspecified chronic kidney disease: Secondary | ICD-10-CM | POA: Insufficient documentation

## 2017-08-13 DIAGNOSIS — C641 Malignant neoplasm of right kidney, except renal pelvis: Secondary | ICD-10-CM | POA: Insufficient documentation

## 2017-08-13 DIAGNOSIS — Z87891 Personal history of nicotine dependence: Secondary | ICD-10-CM | POA: Insufficient documentation

## 2017-08-13 DIAGNOSIS — Z7901 Long term (current) use of anticoagulants: Secondary | ICD-10-CM | POA: Diagnosis not present

## 2017-08-13 DIAGNOSIS — R112 Nausea with vomiting, unspecified: Secondary | ICD-10-CM | POA: Insufficient documentation

## 2017-08-13 DIAGNOSIS — Z951 Presence of aortocoronary bypass graft: Secondary | ICD-10-CM | POA: Insufficient documentation

## 2017-08-13 DIAGNOSIS — I509 Heart failure, unspecified: Secondary | ICD-10-CM | POA: Insufficient documentation

## 2017-08-13 DIAGNOSIS — Z79899 Other long term (current) drug therapy: Secondary | ICD-10-CM | POA: Insufficient documentation

## 2017-08-13 DIAGNOSIS — N183 Chronic kidney disease, stage 3 (moderate): Secondary | ICD-10-CM | POA: Insufficient documentation

## 2017-08-13 DIAGNOSIS — E860 Dehydration: Secondary | ICD-10-CM

## 2017-08-13 LAB — COMPREHENSIVE METABOLIC PANEL
ALBUMIN: 3.2 g/dL — AB (ref 3.5–5.0)
ALT: 101 U/L — ABNORMAL HIGH (ref 17–63)
ANION GAP: 11 (ref 5–15)
AST: 61 U/L — ABNORMAL HIGH (ref 15–41)
Alkaline Phosphatase: 111 U/L (ref 38–126)
BUN: 19 mg/dL (ref 6–20)
CHLORIDE: 99 mmol/L — AB (ref 101–111)
CO2: 25 mmol/L (ref 22–32)
CREATININE: 0.95 mg/dL (ref 0.61–1.24)
Calcium: 8.6 mg/dL — ABNORMAL LOW (ref 8.9–10.3)
GFR calc non Af Amer: >60 mL/min (ref >60)
Glucose, Bld: 141 mg/dL — ABNORMAL HIGH (ref 65–99)
POTASSIUM: 4.8 mmol/L (ref 3.5–5.1)
SODIUM: 135 mmol/L (ref 135–145)
Total Bilirubin: 0.9 mg/dL (ref 0.3–1.2)
Total Protein: 7 g/dL (ref 6.5–8.1)

## 2017-08-13 LAB — CBC
HCT: 32.5 % — ABNORMAL LOW (ref 40.0–52.0)
HEMOGLOBIN: 10.9 g/dL — AB (ref 13.0–18.0)
MCH: 29.8 pg (ref 26.0–34.0)
MCHC: 33.4 g/dL (ref 32.0–36.0)
MCV: 89.3 fL (ref 80.0–100.0)
PLATELETS: 140 10*3/uL — AB (ref 150–440)
RBC: 3.65 MIL/uL — AB (ref 4.40–5.90)
RDW: 23 % — ABNORMAL HIGH (ref 11.5–14.5)
WBC: 6.7 10*3/uL (ref 3.8–10.6)

## 2017-08-13 LAB — LIPASE, BLOOD: Lipase: 15 U/L (ref 11–51)

## 2017-08-13 MED ORDER — HYDROMORPHONE HCL 1 MG/ML IJ SOLN
1.0000 mg | Freq: Once | INTRAMUSCULAR | Status: AC
  Administered 2017-08-13: 1 mg via INTRAVENOUS
  Filled 2017-08-13: qty 1

## 2017-08-13 MED ORDER — SODIUM CHLORIDE 0.9 % IV BOLUS (SEPSIS)
1000.0000 mL | Freq: Once | INTRAVENOUS | Status: AC
  Administered 2017-08-13: 1000 mL via INTRAVENOUS

## 2017-08-13 MED ORDER — MORPHINE SULFATE ER 15 MG PO TBCR
30.0000 mg | EXTENDED_RELEASE_TABLET | Freq: Once | ORAL | Status: AC
  Administered 2017-08-13: 30 mg via ORAL
  Filled 2017-08-13: qty 2

## 2017-08-13 MED ORDER — PROMETHAZINE HCL 25 MG/ML IJ SOLN
25.0000 mg | Freq: Once | INTRAMUSCULAR | Status: AC
  Administered 2017-08-13: 25 mg via INTRAVENOUS
  Filled 2017-08-13: qty 1

## 2017-08-13 NOTE — ED Triage Notes (Addendum)
States 3 episodes of vomiting that began this AM, urinary catheter in placed, states hx of kidney cancer, awake and alert, states hx of chronic back pain and when his pain gets bad hes gets nauseas

## 2017-08-13 NOTE — Progress Notes (Signed)
Cath Change/ Replacement  Patient is present today for a catheter change due to urinary retention.  20ml of water was removed from the balloon, a 16FR foley cath was removed with out difficulty.  Patient was cleaned and prepped in a sterile fashion with betadine and 2% lidocaine jelly was instilled into the urethra. A 16 FR foley cath was replaced into the bladder no complications were noted Urine return was noted 78ml and urine was tea in color. The balloon was filled with 52ml of sterile water. A leg bag was attached for drainage.  A night bag was also given to the patient and patient was given instruction on how to change from one bag to another. Patient was given proper instruction on catheter care.    Preformed by: Toniann Fail, LPN   Follow up: Zara Council, PA assessed pt and agreed pt needs fluids. After foley was changed pt was sent to the ER.   Blood pressure (!) 160/81, pulse (!) 112, height 5\' 7"  (1.702 m).

## 2017-08-13 NOTE — ED Notes (Signed)
Pt presents today from Urology Office. Pt was sent her for dehydration. Pt presents with a foley in place. Pt is A/O and family at bedside.

## 2017-08-13 NOTE — ED Provider Notes (Signed)
[facility] Emergency Department Provider Note  ____________________________________________   First MD Initiated Contact with Patient 08/13/17 1633     (approximate)  I have reviewed the triage vital signs and the nursing notes.   HISTORY  Chief Complaint Emesis   HPI Joseph Ritter is a 76 y.o. male who was sent to the emergency department from urology clinic for possible dehydration.  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  He went to clinic today to have his Foley catheter changed.  His Foley catheter was changed and the patient reported significant nausea so he was advised to come to the emergency department for possible IV hydration.  The patient says that he has nausea every day regularly worse when he runs out of his pain medication.  He has recently begun taking Zofran which she feels makes his symptoms worse.  He previously took Phenergan which seemed to help more.  He has chronic low back pain that is at his baseline.  Pain is insidious onset gradually progressive.  It is improved with morphine and worsened with movement.  Past Medical History:  Diagnosis Date  . Acid indigestion 03/09/2014  . Adult hypothyroidism 11/18/2012  . Anxiety   . Arteriosclerosis of coronary artery 11/18/2012   Overview:  PATENT LIMA TO LAD, PATENT SVG TO PDA AND OCCULUDED SVG TO OM2 BY CATHERIZATION 02/09/2009   . 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 Borderline diabetes 05/25/2014  . BP (high blood pressure) 11/18/2012  . BPH (benign prostatic hypertrophy)   . CAFL (chronic airflow limitation) ([place]) 05/03/2015  . Cancer ([place])    renal cancer with mets  . CCF (congestive cardiac failure) ([place]) 01/03/2014   Overview:  HX OF   . COPD (chronic obstructive pulmonary disease) ([place])   . DDD (degenerative disc disease), lumbar 06/29/2014  . Dermatitis seborrheica  05/03/2015  . 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 Gastric ulcer 03/09/2014  . 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 History of urinary self-catheterization 2017  . HLD (hyperlipidemia) 01/03/2014  . Incomplete bladder emptying 11/18/2012  . Leg weakness 11/18/2012  . Lumbar radiculitis   . Neuritis or radiculitis due to rupture of lumbar intervertebral disc 06/29/2014  . Overactive bladder   . PONV (postoperative nausea and vomiting)    years ago with Ether, no problem with Nausea or vomiting with the last few surgerys  . Pre-diabetes   . 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 Teeth problem    pt reports "bad teeth", "need to be pulled"    Patient Active Problem List   Diagnosis Date Noted  . Sepsis secondary to UTI ([place]) 07/07/2017  . Malignant neoplasm of right kidney ([place])   . Goals of care, counseling/discussion 05/25/2017  . 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 CKD (chronic kidney disease) stage 3, GFR 30-59 ml/min (HCC) 05/08/2017  . Transitional cell carcinoma of kidney, right ([place]) 05/02/2017  . 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 Gross hematuria 08/15/2016  . Dysuria 08/15/2016  . Acute cystitis with hematuria 08/15/2016  . CAFL (chronic airflow limitation) ([place]) 05/03/2015  . Clinical depression 05/03/2015  . 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 DDD (degenerative disc disease), lumbar 06/29/2014  . Neuritis or radiculitis due to rupture of lumbar intervertebral disc  06/29/2014  . 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 Borderline diabetes 05/25/2014  . Blood glucose elevated 05/25/2014  . Acid indigestion 03/09/2014  . Gastric ulcer 03/09/2014  . CCF (congestive cardiac failure) ([place]) 01/03/2014  . HLD (hyperlipidemia) 01/03/2014  . 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 Benign fibroma of  prostate 11/18/2012  . Arteriosclerosis of coronary artery 11/18/2012  . BP (high blood pressure) 11/18/2012  . Adult hypothyroidism 11/18/2012  . Incomplete bladder emptying 11/18/2012  . Leg weakness 11/18/2012  . H/O coronary artery bypass surgery 04/07/2002    Past Surgical History:  Procedure Laterality Date  . BIOPSY TEMPORAL ARTERY Right 12/16/2015   Performed by Algernon Huxley, MD at [facility] ORS  . CARDIAC CATHETERIZATION    . CATARACT EXTRACTION PHACO AND INTRAOCULAR LENS PLACEMENT ([place]) LEFT EYE Left 01/26/2016   Performed by Leandrew Koyanagi, MD at [facility] [employer] GRAFT  04/07/2002   [facility]  . Cysto Bladder Botox Injection  07/02/2014  . CYSTOSCOPY WITH RETROGRADE PYELOGRAM Right 03/16/2017   Performed by [name], MD at [facility] ORS  . CYSTOSCOPY WITH STENT PLACEMENT Right 03/16/2017   Performed by [name], MD at [facility] ORS  . EYE SURGERY Left    blood clot behind left eye  . HAND SURGERY Bilateral   . HERNIA REPAIR Right    inguinal hernia repair  . Left Heart Cath and Cors/Grafts Angiography N/A 04/25/2017   Performed by Isaias Cowman, MD at [place] CV LAB  . Porta Cath Insertion N/A 05/30/2017   Performed by Katha Cabal, MD at [place] [facility]  . ROBOT ASSITED LAPAROSCOPIC NEPHROURETERECTOMY ATTEMPTED Right 05/02/2017   Performed by [name], MD at [facility] ORS  . URETEROSCOPY BIOPSY RENAL MASS Right 03/16/2017   Performed by [name], MD at [facility] ORS    Prior to Admission medications   Medication Sig Start Date End Date Taking? Authorizing Provider  apixaban (ELIQUIS) 5 MG TABS tablet Take 1 tablet (5 mg total) by mouth 2 (two) times daily. 07/27/17  Yes Earlie Server, MD  buPROPion (WELLBUTRIN SR) 100 MG 12 hr tablet TAKE ONE TABLET BY MOUTH EVERY DAY 05/29/17  Yes [provider]  ezetimibe-simvastatin (VYTORIN) 10-40 MG tablet Take 1 tablet by mouth at bedtime.   Yes [provider]  feeding supplement, ENSURE ENLIVE, (ENSURE ENLIVE) LIQD Take 237 mLs by mouth 2 (two) times daily between meals. 07/10/17  Yes Wieting, Richard, MD  HYDROcodone-acetaminophen (NORCO) 7.5-325 MG tablet Take 1 tablet by mouth every 4 (four) hours as needed for moderate pain. 07/23/17  Yes Burns, Wandra Feinstein, NP  ketoconazole (NIZORAL) 2 % shampoo APPLY SHAMPOO TO HAIR AS NEEDED FOR FLAKY/ITCHY SCALP (TYPICALLY EVERY 2-3 DAYS) 03/01/15  Yes [provider]  levothyroxine (SYNTHROID, LEVOTHROID) 125 MCG tablet TAKE ONE TABLET BY MOUTH EVERY DAY BEFORE BREAKFAST 08/06/17  Yes Earlie Server, MD  morphine (MS CONTIN) 30 MG 12 hr tablet Take 2 tablets (60 mg total) by mouth every 12 (twelve) hours. 07/24/17  Yes Jacquelin Hawking, NP  Multiple Vitamin (MULTIVITAMIN WITH MINERALS) TABS tablet Take 1 tablet by mouth daily after breakfast. CENTRUM SILVER   Yes [provider]  nystatin (MYCOSTATIN) 100000 UNIT/ML suspension  06/25/17  Yes [provider]  polyethylene glycol powder (GLYCOLAX/MIRALAX) powder Take 17 g by mouth daily after breakfast. Mix with glass of water 05/08/17  Yes Earlie Server, MD  predniSONE (DELTASONE) 10 MG tablet Take 1 tablet (10 mg total) by mouth daily with breakfast. 07/27/17  Yes Earlie Server, MD  RABEprazole (ACIPHEX) 20 MG tablet Take 20 mg by mouth daily before breakfast.  03/08/15  Yes [provider]  ramipril (ALTACE) 10 MG capsule Take 10 mg by mouth daily after breakfast.   Yes [provider]  SENNA LAX 8.6 MG tablet TAKE TWO TABLETS BY MOUTH DAILY 07/26/17  Yes Earlie Server, MD  sertraline (ZOLOFT) 100 MG tablet Take 100 mg by mouth daily after breakfast.   Yes [provider]  tamsulosin (FLOMAX) 0.4 MG CAPS capsule Take 0.8 mg by mouth daily after supper.  02/04/14  Yes [provider]  cyclobenzaprine (FLEXERIL) 10 MG tablet Take 10 mg by mouth at bedtime as needed for muscle spasms.    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    [provider]  lidocaine (LIDODERM) 5 % Place 1 patch onto the skin every 12 (twelve) hours. Remove & Discard patch within 12 hours or as directed by MD 06/25/17 06/25/18  Earlie Server, MD  LORazepam (ATIVAN) 0.5 MG tablet Take 1 tablet (0.5 mg total) by mouth every 8 (eight) hours as needed for anxiety (nausea). Patient not taking: Reported on 07/27/2017 06/01/17   Earlie Server, MD  PEPPERMINT OIL PO Take 1 capsule by mouth 3 (three) times daily as needed (for IBS). IBgard (active ingredient: 90 mg ultrapurified peppermint oil)    [provider]  promethazine (PHENERGAN) 25 MG suppository Place 1 suppository (25 mg total) rectally every 6 (six) hours as needed for nausea. Patient not taking: Reported on 08/06/2017 07/10/17 07/10/18  Loletha Grayer, MD  promethazine (PHENERGAN) 25 MG tablet Take 1 tablet (25 mg total) by mouth every 6 (six) hours as needed for nausea or vomiting. 07/10/17   Loletha Grayer, MD  sodium phosphate (FLEET) 7-19 GM/118ML ENEM Place 133 mLs (1 enema total) rectally daily as needed for severe constipation. Patient not taking: Reported on 07/27/2017 07/10/17   Loletha Grayer, MD    Allergies Patient has no known allergies.  Family History  Problem Relation Age of Onset  . Heart attack Mother   . Heart disease Father   . Prostate cancer Neg Hx   . Bladder Cancer Neg Hx   . Kidney cancer Neg Hx     Social History Social History   Tobacco Use  . Smoking status: Former Smoker    Types: Cigarettes    Last attempt to quit: 04/07/2002    Years since quitting: 15.3  . Smokeless tobacco: Former Systems developer    Types: Snuff, Chew  Substance Use Topics  . Alcohol use: No    Alcohol/week: 0.0 oz  . Drug use: No    Review of Systems Constitutional: No fever/chills Eyes: No visual changes. ENT: No  sore throat. Cardiovascular: Denies chest pain. Respiratory: Denies shortness of breath. Gastrointestinal: No abdominal pain.  Positive for nausea, positive for vomiting.  No diarrhea.  No constipation. Genitourinary: Negative for dysuria. Musculoskeletal: Positive for back pain. Skin: Negative for rash. Neurological: Negative for headaches, focal weakness or numbness.   ____________________________________________   PHYSICAL EXAM:  VITAL SIGNS: ED Triage Vitals  Enc Vitals Group     BP 08/13/17 1430 (!) 125/59     Pulse Rate 08/13/17 1430 64     Resp  08/13/17 1430 16     Temp 08/13/17 1430 97.9 F (36.6 C)     Temp Source 08/13/17 1430 Oral     SpO2 08/13/17 1430 98 %     Weight 08/13/17 1431 136 lb (61.7 kg)     Height 08/13/17 1431 5\' 7"  (1.702 m)     Head Circumference --      Peak Flow --      Pain Score 08/13/17 1429 5     Pain Loc --      Pain Edu? --      Excl. in [place]? --     Constitutional: Alert and oriented x4 pleasant cooperative obviously uncomfortable but no acute distress Eyes: PERRL EOMI. Head: Atraumatic. Nose: No congestion/rhinnorhea. Mouth/Throat: No trismus Neck: No stridor.   Cardiovascular: Normal rate, regular rhythm. Grossly normal heart sounds.  Good peripheral circulation. Respiratory: Normal respiratory effort.  No retractions. Lungs CTAB and moving good air Gastrointestinal: Soft nontender no peritonitis Foley catheter in place draining clear urine Musculoskeletal: No lower extremity edema   Neurologic:  Normal speech and language. No gross focal neurologic deficits are appreciated. Skin:  Skin is warm, dry and intact. No rash noted. Psychiatric: Mood and affect are normal. Speech and behavior are normal.    ____________________________________________   DIFFERENTIAL includes but not limited to  Urinary tract infection, small bowel obstruction, volvulus, chemotherapy related nausea ____________________________________________     LABS (all labs ordered are listed, but only abnormal results are displayed)  Labs Reviewed  COMPREHENSIVE METABOLIC PANEL - Abnormal; Notable for the following components:      Result Value   Chloride 99 (*)    Glucose, Bld 141 (*)    Calcium 8.6 (*)    Albumin 3.2 (*)    AST 61 (*)    ALT 101 (*)    All other components within normal limits  CBC - Abnormal; Notable for the following components:   RBC 3.65 (*)    Hemoglobin 10.9 (*)    HCT 32.5 (*)    RDW 23.0 (*)    Platelets 140 (*)    All other components within normal limits  LIPASE, BLOOD  URINALYSIS, COMPLETE (UACMP) WITH MICROSCOPIC    Blood work reviewed and interpreted by me at the patient's baseline with no acute disease __________________________________________  EKG   ____________________________________________  RADIOLOGY   ____________________________________________   PROCEDURES  Procedure(s) performed: no  Procedures  Critical Care performed: no  Observation: no ____________________________________________   INITIAL IMPRESSION / ASSESSMENT AND PLAN / ED COURSE  Pertinent labs & imaging results that were available during my care of the patient were reviewed by me and considered in my medical decision making (see chart for details).  Patient arrives hemodynamically stable and overall well-appearing although clearly nauseated and discomfort.  He has missed his home dose of MS Contin today so I will give him an IV dose of hydromorphone along with Phenergan and some IV fluids.  Home dose of MS Contin is pending.  The patient reports feeling worse with Zofran so I have encouraged him to stop taking Zofran and switching over completely to Phenergan.  ----------------------------------------- 6:01 PM on 08/13/2017 -----------------------------------------  The patient feels improved after Dilaudid and Phenergan and some IV fluids.  He is currently able to tolerate orals.  At this point it is no  indication for inpatient admission and he is medically stable for outpatient management verbalizes understanding and agreement the plan.      ____________________________________________  FINAL CLINICAL IMPRESSION(S) / ED DIAGNOSES  Final diagnoses:  Nausea and vomiting, intractability of vomiting not specified, unspecified vomiting type  Dehydration      NEW MEDICATIONS STARTED DURING THIS VISIT:  This SmartLink is deprecated. Use AVSMEDLIST instead to display the medication list for a patient.   Note:  This document was prepared using Dragon voice recognition software and may include unintentional dictation errors.     Darel Hong, MD 08/13/17 [phone]

## 2017-08-13 NOTE — Discharge Instructions (Addendum)
Please use your Phenergan up to 4 times a day as needed for nausea and make sure you remain well-hydrated with lots of small sips of fluids.  Follow-up with your primary care physician as scheduled and return to the emergency department for any concerns whatsoever.  It was a pleasure to take care of you today, and thank you for coming to our emergency department.  If you have any questions or concerns before leaving please ask the nurse to grab me and I'm more than happy to go through your aftercare instructions again.  If you were prescribed any opioid pain medication today such as Norco, Vicodin, Percocet, morphine, hydrocodone, or oxycodone please make sure you do not drive when you are taking this medication as it can alter your ability to drive safely.  If you have any concerns once you are home that you are not improving or are in fact getting worse before you can make it to your follow-up appointment, please do not hesitate to call [phone] and come back for further evaluation.  Darel Hong, MD  Results for orders placed or performed during the hospital encounter of 08/13/17  Lipase, blood  Result Value Ref Range   Lipase 15 11 - 51 U/L  Comprehensive metabolic panel  Result Value Ref Range   Sodium 135 135 - 145 mmol/L   Potassium 4.8 3.5 - 5.1 mmol/L   Chloride 99 (L) 101 - 111 mmol/L   CO2 25 22 - 32 mmol/L   Glucose, Bld 141 (H) 65 - 99 mg/dL   BUN 19 6 - 20 mg/dL   Creatinine, Ser 0.95 0.61 - 1.24 mg/dL   Calcium 8.6 (L) 8.9 - 10.3 mg/dL   Total Protein 7.0 6.5 - 8.1 g/dL   Albumin 3.2 (L) 3.5 - 5.0 g/dL   AST 61 (H) 15 - 41 U/L   ALT 101 (H) 17 - 63 U/L   Alkaline Phosphatase 111 38 - 126 U/L   Total Bilirubin 0.9 0.3 - 1.2 mg/dL   GFR calc non Af Amer >60 >60 mL/min   GFR calc Af Amer >60 >60 mL/min   Anion gap 11 5 - 15  CBC  Result Value Ref Range   WBC 6.7 3.8 - 10.6 K/uL   RBC 3.65 (L) 4.40 - 5.90 MIL/uL   Hemoglobin 10.9 (L) 13.0 - 18.0 g/dL   HCT 32.5 (L) 40.0  - 52.0 %   MCV 89.3 80.0 - 100.0 fL   MCH 29.8 26.0 - 34.0 pg   MCHC 33.4 32.0 - 36.0 g/dL   RDW 23.0 (H) 11.5 - 14.5 %   Platelets 140 (L) 150 - 440 K/uL

## 2017-08-20 ENCOUNTER — Other Ambulatory Visit: Payer: Self-pay

## 2017-08-20 ENCOUNTER — Encounter: Payer: Self-pay | Admitting: Oncology

## 2017-08-20 ENCOUNTER — Inpatient Hospital Stay: Payer: Medicare Other

## 2017-08-20 ENCOUNTER — Inpatient Hospital Stay (HOSPITAL_BASED_OUTPATIENT_CLINIC_OR_DEPARTMENT_OTHER): Payer: Medicare Other | Admitting: Oncology

## 2017-08-20 VITALS — BP 109/63 | HR 105 | Temp 98.2°F | Wt 139.4 lb

## 2017-08-20 DIAGNOSIS — E785 Hyperlipidemia, unspecified: Secondary | ICD-10-CM

## 2017-08-20 DIAGNOSIS — R7401 Elevation of levels of liver transaminase levels: Secondary | ICD-10-CM

## 2017-08-20 DIAGNOSIS — C651 Malignant neoplasm of right renal pelvis: Secondary | ICD-10-CM | POA: Diagnosis not present

## 2017-08-20 DIAGNOSIS — R7303 Prediabetes: Secondary | ICD-10-CM

## 2017-08-20 DIAGNOSIS — R140 Abdominal distension (gaseous): Secondary | ICD-10-CM

## 2017-08-20 DIAGNOSIS — G893 Neoplasm related pain (acute) (chronic): Secondary | ICD-10-CM

## 2017-08-20 DIAGNOSIS — Z905 Acquired absence of kidney: Secondary | ICD-10-CM | POA: Diagnosis not present

## 2017-08-20 DIAGNOSIS — T451X5S Adverse effect of antineoplastic and immunosuppressive drugs, sequela: Secondary | ICD-10-CM | POA: Diagnosis not present

## 2017-08-20 DIAGNOSIS — R740 Nonspecific elevation of levels of transaminase and lactic acid dehydrogenase [LDH]: Secondary | ICD-10-CM

## 2017-08-20 DIAGNOSIS — K5903 Drug induced constipation: Secondary | ICD-10-CM

## 2017-08-20 DIAGNOSIS — M5136 Other intervertebral disc degeneration, lumbar region: Secondary | ICD-10-CM

## 2017-08-20 DIAGNOSIS — Z5112 Encounter for antineoplastic immunotherapy: Secondary | ICD-10-CM | POA: Diagnosis not present

## 2017-08-20 DIAGNOSIS — T402X5S Adverse effect of other opioids, sequela: Secondary | ICD-10-CM

## 2017-08-20 DIAGNOSIS — C7951 Secondary malignant neoplasm of bone: Secondary | ICD-10-CM | POA: Diagnosis not present

## 2017-08-20 DIAGNOSIS — C679 Malignant neoplasm of bladder, unspecified: Secondary | ICD-10-CM | POA: Diagnosis not present

## 2017-08-20 DIAGNOSIS — Z87891 Personal history of nicotine dependence: Secondary | ICD-10-CM

## 2017-08-20 DIAGNOSIS — N183 Chronic kidney disease, stage 3 (moderate): Secondary | ICD-10-CM

## 2017-08-20 DIAGNOSIS — E039 Hypothyroidism, unspecified: Secondary | ICD-10-CM

## 2017-08-20 DIAGNOSIS — M549 Dorsalgia, unspecified: Secondary | ICD-10-CM

## 2017-08-20 DIAGNOSIS — N319 Neuromuscular dysfunction of bladder, unspecified: Secondary | ICD-10-CM

## 2017-08-20 DIAGNOSIS — I2510 Atherosclerotic heart disease of native coronary artery without angina pectoris: Secondary | ICD-10-CM

## 2017-08-20 DIAGNOSIS — R530 Neoplastic (malignant) related fatigue: Secondary | ICD-10-CM | POA: Diagnosis not present

## 2017-08-20 DIAGNOSIS — R112 Nausea with vomiting, unspecified: Secondary | ICD-10-CM

## 2017-08-20 DIAGNOSIS — Z9221 Personal history of antineoplastic chemotherapy: Secondary | ICD-10-CM

## 2017-08-20 DIAGNOSIS — D6959 Other secondary thrombocytopenia: Secondary | ICD-10-CM | POA: Diagnosis not present

## 2017-08-20 DIAGNOSIS — Z7952 Long term (current) use of systemic steroids: Secondary | ICD-10-CM

## 2017-08-20 DIAGNOSIS — R109 Unspecified abdominal pain: Secondary | ICD-10-CM

## 2017-08-20 DIAGNOSIS — Z7189 Other specified counseling: Secondary | ICD-10-CM

## 2017-08-20 DIAGNOSIS — N400 Enlarged prostate without lower urinary tract symptoms: Secondary | ICD-10-CM

## 2017-08-20 DIAGNOSIS — J449 Chronic obstructive pulmonary disease, unspecified: Secondary | ICD-10-CM

## 2017-08-20 DIAGNOSIS — Z7901 Long term (current) use of anticoagulants: Secondary | ICD-10-CM

## 2017-08-20 DIAGNOSIS — T451X5A Adverse effect of antineoplastic and immunosuppressive drugs, initial encounter: Secondary | ICD-10-CM

## 2017-08-20 DIAGNOSIS — C641 Malignant neoplasm of right kidney, except renal pelvis: Secondary | ICD-10-CM | POA: Diagnosis not present

## 2017-08-20 DIAGNOSIS — Z7982 Long term (current) use of aspirin: Secondary | ICD-10-CM

## 2017-08-20 DIAGNOSIS — I509 Heart failure, unspecified: Secondary | ICD-10-CM

## 2017-08-20 DIAGNOSIS — N3281 Overactive bladder: Secondary | ICD-10-CM

## 2017-08-20 DIAGNOSIS — Z79899 Other long term (current) drug therapy: Secondary | ICD-10-CM

## 2017-08-20 LAB — TSH: TSH: 5.18 u[IU]/mL — ABNORMAL HIGH (ref 0.350–4.500)

## 2017-08-20 LAB — CBC WITH DIFFERENTIAL/PLATELET
BASOS ABS: 0 10*3/uL (ref 0–0.1)
Basophils Relative: 0 %
Eosinophils Absolute: 0.2 10*3/uL (ref 0–0.7)
Eosinophils Relative: 2 %
HEMATOCRIT: 29.3 % — AB (ref 40.0–52.0)
Hemoglobin: 9.8 g/dL — ABNORMAL LOW (ref 13.0–18.0)
LYMPHS PCT: 12 %
Lymphs Abs: 1.4 10*3/uL (ref 1.0–3.6)
MCH: 29.7 pg (ref 26.0–34.0)
MCHC: 33.5 g/dL (ref 32.0–36.0)
MCV: 88.7 fL (ref 80.0–100.0)
Monocytes Absolute: 0.7 10*3/uL (ref 0.2–1.0)
Monocytes Relative: 7 %
NEUTROS ABS: 8.8 10*3/uL — AB (ref 1.4–6.5)
Neutrophils Relative %: 79 %
PLATELETS: 187 10*3/uL (ref 150–440)
RBC: 3.3 MIL/uL — AB (ref 4.40–5.90)
RDW: 21.9 % — ABNORMAL HIGH (ref 11.5–14.5)
WBC: 11.2 10*3/uL — AB (ref 3.8–10.6)

## 2017-08-20 LAB — COMPREHENSIVE METABOLIC PANEL
ALBUMIN: 3 g/dL — AB (ref 3.5–5.0)
ALT: 243 U/L — ABNORMAL HIGH (ref 17–63)
ANION GAP: 11 (ref 5–15)
AST: 106 U/L — ABNORMAL HIGH (ref 15–41)
Alkaline Phosphatase: 124 U/L (ref 38–126)
BILIRUBIN TOTAL: 0.6 mg/dL (ref 0.3–1.2)
BUN: 23 mg/dL — ABNORMAL HIGH (ref 6–20)
CO2: 24 mmol/L (ref 22–32)
Calcium: 9 mg/dL (ref 8.9–10.3)
Chloride: 98 mmol/L — ABNORMAL LOW (ref 101–111)
Creatinine, Ser: 1.1 mg/dL (ref 0.61–1.24)
GFR calc Af Amer: >60 mL/min (ref >60)
Glucose, Bld: 139 mg/dL — ABNORMAL HIGH (ref 65–99)
POTASSIUM: 4.5 mmol/L (ref 3.5–5.1)
Sodium: 133 mmol/L — ABNORMAL LOW (ref 135–145)
TOTAL PROTEIN: 7.2 g/dL (ref 6.5–8.1)

## 2017-08-20 MED ORDER — MORPHINE SULFATE ER 30 MG PO TBCR: 60.0000 mg | EXTENDED_RELEASE_TABLET | Freq: Two times a day (BID) | ORAL | 0 refills | Status: DC

## 2017-08-20 MED ORDER — SODIUM CHLORIDE 0.9% FLUSH
10.0000 mL | INTRAVENOUS | Status: DC | PRN
  Administered 2017-08-20: 10 mL via INTRAVENOUS
  Filled 2017-08-20: qty 10

## 2017-08-20 MED ORDER — HEPARIN SOD (PORK) LOCK FLUSH 100 UNIT/ML IV SOLN
500.0000 [IU] | Freq: Once | INTRAVENOUS | Status: AC
  Administered 2017-08-20: 500 [IU] via INTRAVENOUS
  Filled 2017-08-20: qty 5

## 2017-08-20 NOTE — Progress Notes (Signed)
Patient here today for follow up.  Patient states no new concerns today  

## 2017-08-20 NOTE — Progress Notes (Signed)
[facility] Follow Up visit  Patient Care Team: [name], MD as PCP - General (Physician Assistant)  REASON FOR VISIT Treatment of Locally advanced kidney urothelial cancer  ONCOLOGY HISTORY Joseph Ritter 76 y.o. male with PMH listed as below  is referred to [place] for evaluation of locally advanced kidney urothelia cancer. Patient developed gross hematuria at the end of 2017. CT 08/2016 showed endophytic hypodense mass. Repeat image 01/2017 showed enlarged  endophytic hypodense mass right kidney. 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 Right robotic nephroureterectomy was attempted but aborted due to unresectable newly metastatic disease diagnosed intraoperatively. Postop he underwent a CT scan of the abdomen and pelvis which showed extension of an infiltrative mass from the right kidney along the right renal vein toward the IVC with encasement of the IVC.  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 He self catheterize a few time a day.  He reports having back pain which limits her ability to walk. Also constipated and abdominal bloating. His appetite is fair. He takes Norco for abdominal and back pain.  He can walk with walker for very short distance, mostly limited by back pain and fatigue./weakness.  # Images: CT chest and abdomen 05/03/2017 1. Pneumoperitoneum with a small amount of ascites, gas tracking along the thoracoabdominal wall into the scrotum, and a small amount of gas in the right perirenal space-these findings are likely related to the patient's attempted nephroureterectomy yesterday. Surveillance to ensure expected resolution of the pneumoperitoneum might be appropriate. 2. The right kidney lower pole infarct with lack of patency of the more inferior of the 3 right renal arteries. There is delayed nephrogram on the right with increase an infiltrative process in theright  kidney and extending into the right renal hilar vascular structures, including the right renal vein and the adjacent IVC, compatible with tumor thrombus. There is also soft tissue density surrounding the renal hilar vascular structures and extending into the retroperitoneum and periaortic region, some of which may be tumor and some of which may be hematoma.3. Demineralization of the anterior inferior L2 vertebral body,concerning for tumor invasion. This is near the level of the retroaortic left renal vein. 4. No findings of metastatic disease to the chest. There are new trace bilateral pleural effusions with mild passive atelectasis.  #Pathology: 05/02/2017 SPECIMEN SUBMITTED: A. Hilar tissue, right kidney B. Hilar tissue, right kidney DIAGNOSIS:  A and B. SOFT TISSUE, RIGHT RENAL HILUM  PD-L1 - Urothelial (TECENTRIQ) Immunohistochemistry Analysis  Result: 0% Interpretation: No expression   # Current Treatment: 1 Palliative RT to spine 2 Chemotherapy:     06/01/2017 Cycle 1 Gemcitabine and Carboplain.  Day 8 Gemcitabine was delayed due to intractable nausea and vomiting.     Chemotherapy related thrombocytopenia, no active bleeding. 50% dose of Gemcitabine was given on day 11.     06/25/2017 Cycle 2 Day 1 Gemcitabine and Carboplain.    07/03/2017 Cycle 2 Day 8 chemotherapy discontinued.      3 Immunotherapy:     07/27/2017 cycle 1 of Atezolizumab,        INTERVAL HISTORY 76 yo male who has above oncology history reviewed by me today presents for evaluation for immunotherapy. Reports feeling well. He still has back pain, for which pain medication helps, except when the pain exacerbates if he sits too long or has to switch position. His appetite is good. Per wife, he had two good meals  per days for the past couple of days.  He denies feeling nausea until this morning knowing that he is coming to clinic.   Review of Systems  Constitutional: Positive for fatigue. Negative for chills and fever.   HENT:   Negative for lump/mass.   Eyes: Negative for eye problems.  Respiratory: Negative for chest tightness and shortness of breath.   Cardiovascular: Negative for chest pain, leg swelling and palpitations.  Gastrointestinal: Positive for nausea. Negative for abdominal pain, blood in stool and vomiting.  Endocrine: Negative for hot flashes.  Genitourinary: Positive for difficulty urinating. Negative for dysuria and hematuria.   Musculoskeletal: Positive for back pain. Negative for flank pain.  Skin: Negative for itching.  Neurological: Negative for dizziness.  Hematological: Negative for adenopathy.  Psychiatric/Behavioral: The patient is not nervous/anxious.      MEDICAL HISTORY: Past Medical History:  Diagnosis Date  . Acid indigestion 03/09/2014  . Adult hypothyroidism 11/18/2012  . Anxiety   . Arteriosclerosis of coronary artery 11/18/2012   Overview:  PATENT LIMA TO LAD, PATENT SVG TO PDA AND OCCULUDED SVG TO OM2 BY CATHERIZATION 02/09/2009   . 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 Borderline diabetes 05/25/2014  . BP (high blood pressure) 11/18/2012  . BPH (benign prostatic hypertrophy)   . CAFL (chronic airflow limitation) ([place]) 05/03/2015  . Cancer ([place])    renal cancer with mets  . CCF (congestive cardiac failure) ([place]) 01/03/2014   Overview:  HX OF   . COPD (chronic obstructive pulmonary disease) ([place])   . DDD (degenerative disc disease), lumbar 06/29/2014  . 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 Gastric ulcer 03/09/2014  . 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 History of urinary self-catheterization 2017  . HLD (hyperlipidemia) 01/03/2014  . Incomplete bladder emptying 11/18/2012  . Leg weakness 11/18/2012  . Lumbar radiculitis   . Neuritis or radiculitis due to rupture of lumbar intervertebral disc 06/29/2014  . Overactive bladder    . PONV (postoperative nausea and vomiting)    years ago with Ether, no problem with Nausea or vomiting with the last few surgerys  . Pre-diabetes   . Subacute transverse myelitis ([place]) 11/21/2012  . Teeth problem    pt reports "bad teeth", "need to be pulled"    SURGICAL HISTORY: Past Surgical History:  Procedure Laterality Date  . ARTERY BIOPSY Right 12/16/2015   Procedure: BIOPSY TEMPORAL ARTERY;  Surgeon: Algernon Huxley, MD;  Location: [facility];  Service: Vascular;  Laterality: Right;  . CARDIAC CATHETERIZATION    . CATARACT EXTRACTION W/PHACO Left 01/26/2016   Procedure: CATARACT EXTRACTION PHACO AND INTRAOCULAR LENS PLACEMENT (IOC) LEFT EYE;  Surgeon: Leandrew Koyanagi, MD;  Location: [facility];  Service: Ophthalmology;  Laterality: Left;  . CORONARY ARTERY BYPASS GRAFT  04/07/2002   [facility]  . Cysto Bladder Botox Injection  07/02/2014  . CYSTOSCOPY W/ RETROGRADES Right 03/16/2017   Procedure: CYSTOSCOPY WITH RETROGRADE PYELOGRAM;  Surgeon: [name], MD;  Location: [facility];  Service: Urology;  Laterality: Right;  . CYSTOSCOPY WITH STENT PLACEMENT Right 03/16/2017   Procedure: CYSTOSCOPY WITH STENT PLACEMENT;  Surgeon: [name], MD;  Location: [facility];  Service: Urology;  Laterality: Right;  . EYE SURGERY Left    blood clot behind left eye  . HAND SURGERY Bilateral   . HERNIA REPAIR Right  inguinal hernia repair  . LEFT HEART CATH AND CORS/GRAFTS ANGIOGRAPHY N/A 04/25/2017   Procedure: Left Heart Cath and Cors/Grafts Angiography;  Surgeon: Isaias Cowman, MD;  Location: [facility];  Service: Cardiovascular;  Laterality: N/A;  . PORTA CATH INSERTION N/A 05/30/2017   Procedure: Glori Luis Cath Insertion;  Surgeon: Katha Cabal, MD;  Location: [facility];  Service: Cardiovascular;  Laterality: N/A;  . ROBOT ASSITED LAPAROSCOPIC NEPHROURETERECTOMY Right 05/02/2017   Procedure: ROBOT ASSITED LAPAROSCOPIC NEPHROURETERECTOMY ATTEMPTED;   Surgeon: [name], MD;  Location: [facility];  Service: Urology;  Laterality: Right;  . URETEROSCOPY Right 03/16/2017   Procedure: URETEROSCOPY BIOPSY RENAL MASS;  Surgeon: [name], MD;  Location: [facility];  Service: Urology;  Laterality: Right;    SOCIAL HISTORY: Social History   Socioeconomic History  . Marital status: Married    Spouse name: Not on file  . Number of children: Not on file  . Years of education: Not on file  . Highest education level: Not on file  Social Needs  . Financial resource strain: Not on file  . Food insecurity - worry: Not on file  . Food insecurity - inability: Not on file  . Transportation needs - medical: Not on file  . Transportation needs - non-medical: Not on file  Occupational History  . Not on file  Tobacco Use  . Smoking status: Former Smoker    Types: Cigarettes    Last attempt to quit: 04/07/2002    Years since quitting: 15.3  . Smokeless tobacco: Former Systems developer    Types: Snuff, Chew  Substance and Sexual Activity  . Alcohol use: No    Alcohol/week: 0.0 oz  . Drug use: No  . Sexual activity: Not on file  Other Topics Concern  . Not on file  Social History Narrative  . Not on file    FAMILY HISTORY Family History  Problem Relation Age of Onset  . Heart attack Mother   . Heart disease Father   . Prostate cancer Neg Hx   . Bladder Cancer Neg Hx   . Kidney cancer Neg Hx     ALLERGIES:  has No Known Allergies.  MEDICATIONS:  Current Outpatient Medications  Medication Sig Dispense Refill  . apixaban (ELIQUIS) 5 MG TABS tablet Take 1 tablet (5 mg total) by mouth 2 (two) times daily. 60 tablet 0  . buPROPion (WELLBUTRIN SR) 100 MG 12 hr tablet TAKE ONE TABLET BY MOUTH EVERY DAY    . cyclobenzaprine (FLEXERIL) 10 MG tablet Take 10 mg by mouth at bedtime as needed for muscle spasms.    Marland Kitchen ezetimibe-simvastatin (VYTORIN) 10-40 MG tablet Take 1 tablet by mouth at bedtime.    . feeding supplement, ENSURE ENLIVE,  (ENSURE ENLIVE) LIQD Take 237 mLs by mouth 2 (two) times daily between meals. 60 Bottle 0  . HYDROcodone-acetaminophen (NORCO) 7.5-325 MG tablet Take 1 tablet by mouth every 4 (four) hours as needed for moderate pain. 180 tablet 0  . 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    Marland Kitchen ketoconazole (NIZORAL) 2 % shampoo APPLY SHAMPOO TO HAIR AS NEEDED FOR FLAKY/ITCHY SCALP (TYPICALLY EVERY 2-3 DAYS)    . levothyroxine (SYNTHROID, LEVOTHROID) 125 MCG tablet TAKE ONE TABLET BY MOUTH EVERY DAY BEFORE BREAKFAST 30 tablet 0  . lidocaine (LIDODERM) 5 % Place 1 patch onto the skin every 12 (twelve)  hours. Remove & Discard patch within 12 hours or as directed by MD 60 patch 11  . LORazepam (ATIVAN) 0.5 MG tablet Take 1 tablet (0.5 mg total) by mouth every 8 (eight) hours as needed for anxiety (nausea). (Patient not taking: Reported on 07/27/2017) 30 tablet 0  . morphine (MS CONTIN) 30 MG 12 hr tablet Take 2 tablets (60 mg total) by mouth every 12 (twelve) hours. 120 tablet 0  . Multiple Vitamin (MULTIVITAMIN WITH MINERALS) TABS tablet Take 1 tablet by mouth daily after breakfast. CENTRUM SILVER    . nystatin (MYCOSTATIN) 100000 UNIT/ML suspension   2  . PEPPERMINT OIL PO Take 1 capsule by mouth 3 (three) times daily as needed (for IBS). IBgard (active ingredient: 90 mg ultrapurified peppermint oil)    . polyethylene glycol powder (GLYCOLAX/MIRALAX) powder Take 17 g by mouth daily after breakfast. Mix with glass of water 255 g 1  . predniSONE (DELTASONE) 10 MG tablet Take 1 tablet (10 mg total) by mouth daily with breakfast. 30 tablet 0  . promethazine (PHENERGAN) 25 MG suppository Place 1 suppository (25 mg total) rectally every 6 (six) hours as needed for nausea. (Patient not taking: Reported on 08/06/2017) 30 suppository 0  . promethazine (PHENERGAN) 25 MG tablet Take 1 tablet (25 mg total) by mouth  every 6 (six) hours as needed for nausea or vomiting. 30 tablet 0  . RABEprazole (ACIPHEX) 20 MG tablet Take 20 mg by mouth daily before breakfast.     . ramipril (ALTACE) 10 MG capsule Take 10 mg by mouth daily after breakfast.    . SENNA LAX 8.6 MG tablet TAKE TWO TABLETS BY MOUTH DAILY 120 tablet 3  . sertraline (ZOLOFT) 100 MG tablet Take 100 mg by mouth daily after breakfast.    . sodium phosphate (FLEET) 7-19 GM/118ML ENEM Place 133 mLs (1 enema total) rectally daily as needed for severe constipation. (Patient not taking: Reported on 07/27/2017) 5 enema 0  . tamsulosin (FLOMAX) 0.4 MG CAPS capsule Take 0.8 mg by mouth daily after supper.      No current facility-administered medications for this visit.    Facility-Administered Medications Ordered in Other Visits  Medication Dose Route Frequency Provider Last Rate Last Dose  . heparin lock flush 100 unit/mL  500 Units Intravenous Once Earlie Server, MD      . sodium chloride flush (NS) 0.9 % injection 10 mL  10 mL Intravenous PRN Earlie Server, MD   10 mL at 07/03/17 0930    PHYSICAL EXAMINATION:  ECOG PERFORMANCE STATUS: 2 - Symptomatic, <50% confined to bed  Vitals:   08/20/17 1018  BP: 109/63  Pulse: (!) 105  Temp: 98.2 F (36.8 C)    Filed Weights   08/20/17 1018  Weight: 139 lb 7 oz (63.2 kg)     Physical Exam  Constitutional: He is oriented to person, place, and time. No distress.  HENT:  Head: Normocephalic and atraumatic.  Eyes: Conjunctivae and EOM are normal. Pupils are equal, round, and reactive to light. Left eye exhibits no discharge.  Neck: Normal range of motion. Neck supple. No JVD present. No tracheal deviation present.  Cardiovascular: Normal rate and regular rhythm.  No murmur heard. Pulmonary/Chest: No stridor. No respiratory distress. He has no wheezes. He has no rales.  Abdominal: He exhibits no distension. There is no tenderness.  Genitourinary:  Genitourinary Comments: Self catheraization   Musculoskeletal: Normal range of motion. He exhibits no edema.  Palpable spinal tenderness.  Lymphadenopathy:  He has no cervical adenopathy.  Neurological: He is alert and oriented to person, place, and time. No cranial nerve deficit.  Skin: Skin is warm. No rash noted. No erythema.  Psychiatric: Affect normal.    ECOG 2  LABORATORY DATA: I have personally reviewed the data as listed: CBC    Component Value Date/Time   WBC 11.2 (H) 08/20/2017 0930   RBC 3.30 (L) 08/20/2017 0930   HGB 9.8 (L) 08/20/2017 0930   HGB 15.3 11/15/2012 1641   HCT 29.3 (L) 08/20/2017 0930   HCT 43.7 11/15/2012 1641   PLT 187 08/20/2017 0930   PLT 166 11/15/2012 1641   MCV 88.7 08/20/2017 0930   MCV 89 11/15/2012 1641   MCH 29.7 08/20/2017 0930   MCHC 33.5 08/20/2017 0930   RDW 21.9 (H) 08/20/2017 0930   RDW 13.3 11/15/2012 1641   LYMPHSABS 1.4 08/20/2017 0930   LYMPHSABS 1.7 11/15/2012 1641   MONOABS 0.7 08/20/2017 0930   MONOABS 0.8 11/15/2012 1641   EOSABS 0.2 08/20/2017 0930   EOSABS 0.1 11/15/2012 1641   BASOSABS 0.0 08/20/2017 0930   BASOSABS 0.0 11/15/2012 1641   CMP Latest Ref Rng & Units 08/20/2017 08/13/2017 08/06/2017  Glucose 65 - 99 mg/dL 139(H) 141(H) 177(H)  BUN 6 - 20 mg/dL 23(H) 19 18  Creatinine 0.61 - 1.24 mg/dL 1.10 0.95 0.96  Sodium 135 - 145 mmol/L 133(L) 135 135  Potassium 3.5 - 5.1 mmol/L 4.5 4.8 4.4  Chloride 101 - 111 mmol/L 98(L) 99(L) 94(L)  CO2 22 - 32 mmol/L 24 25 26   Calcium 8.9 - 10.3 mg/dL 9.0 8.6(L) 9.8  Total Protein 6.5 - 8.1 g/dL 7.2 7.0 7.5  Total Bilirubin 0.3 - 1.2 mg/dL 0.6 0.9 0.5  Alkaline Phos 38 - 126 U/L 124 111 112  AST 15 - 41 U/L 106(H) 61(H) 37  ALT 17 - 63 U/L 243(H) 101(H) 55     RADIOGRAPHIC STUDIES: I have personally reviewed the radiological images as listed and agree with the findings in the report 5.  Aortic Atherosclerosis (ICD10-I70.0).  Coronary atherosclerosis. 6. Inflammatory stranding from the right perirenal space  extends down into the pelvis and crosses the midline. There is presacral edema. 7. 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 Recommend followup by ultrasound in 3 years. This recommendation follows ACR consensus guidelines: White Paper of the ACR Incidental Findings Committee II on Vascular Findings. J Am Coll Radiol 2013; 56:153-794 8. Wall thickening along the left side of the urinary bladder may be incidental, but synchronous sessile transitional cell carcinoma is not readily excluded.  CT abdomen wo contrast 02/09/2017  Increased size of 4.6 cm ill-defined hypoenhancing mass ininterpolar region of right kidney, with obstruction of upper pole collecting system. This does not have typical appearance for pyelonephritis or renal cell carcinoma, and raises suspicion for urothelial carcinoma. Consider ureteroscopy for further evaluation. No evidence of metastatic disease. Stable mildly enlarged prostate and mild diffuse bladder wall thickening. Stable 3.0 cm infrarenal abdominal aortic aneurysm. Recommend followup by ultrasound in 3 years. This recommendation follows ACR consensus guidelines: White Paper of the ACR Incidental Findings Committee II on Vascular Findings. [employer] [phone]. Colonic diverticulosis. No radiographic evidence of diverticulitis. Stable small fat-containing left inguinal hernia. MRI thoracic and lumbar w/wo contrast.  1. Malignant invasion of the L2 vertebral body by the adjacent retroperitoneal mass. No pathologic fracture or osseous metastatic disease elsewhere in the thoracolumbar spine. 2. Areas of epidural contrast enhancement of the lumbar spine  are most consistent with a dilated epidural venous plexus secondary to invasion of the inferior vena cava by the patient's urothelial tumor. 3. Moderate L4-L5 multifactorial spinal canal stenosis with severe right and mild-to-moderate left neural foraminal stenosis. 4. Moderate  right and severe left multifactorial L5-S1 neural foraminal stenosis. There is also severe attenuation of the thecal sac at this level but no bony spinal canal stenosis. 5. No spinal canal or neural foraminal stenosis in the thoracic spine.   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 Encounter for antineoplastic immunotherapy   2. Transitional cell carcinoma of kidney, right ([place])   3. 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 Goals of care, counseling/discussion   5. Chemotherapy-induced thrombocytopenia   6. Transaminitis    # Tolerated cycle 1 Atezulizulab well. # Grade 2 transaminitis, likely hepatoxicity from immunotherapy. Hold today's treatment.     He is taking prednisone 72m daily, increase to 240mdaily. Repeat labs in 1 week   # Neoplasm related bone pain: continue current pain regimen. Refilled MS contin today.   XgDelton See28 days scheduled through march next year.  #  Constipation: continue current bowel regimen   #Hypothyroidism on Synthroid 12534mdaily. TSH trending down. Continue current dosage.   Follow up on 08/27/2017 Lab/MD/immnotherapy.   ZhoEarlie ServerD  08/20/2017 8:48 AM

## 2017-08-20 NOTE — Progress Notes (Signed)
Nutrition  RD planning to visit with patient during infusion today but infusion cancelled.  Will try and follow-up at a later date.  Joseph Ritter [name], [name], [name] Registered Dietitian [phone] (pager)

## 2017-08-22 ENCOUNTER — Other Ambulatory Visit: Payer: Self-pay | Admitting: Oncology

## 2017-08-22 DIAGNOSIS — Z5111 Encounter for antineoplastic chemotherapy: Secondary | ICD-10-CM

## 2017-08-22 DIAGNOSIS — C641 Malignant neoplasm of right kidney, except renal pelvis: Secondary | ICD-10-CM

## 2017-08-22 DIAGNOSIS — Z7189 Other specified counseling: Secondary | ICD-10-CM

## 2017-08-22 DIAGNOSIS — C7951 Secondary malignant neoplasm of bone: Secondary | ICD-10-CM

## 2017-08-22 DIAGNOSIS — N183 Chronic kidney disease, stage 3 unspecified: Secondary | ICD-10-CM

## 2017-08-22 DIAGNOSIS — D6959 Other secondary thrombocytopenia: Secondary | ICD-10-CM

## 2017-08-22 DIAGNOSIS — T451X5A Adverse effect of antineoplastic and immunosuppressive drugs, initial encounter: Secondary | ICD-10-CM

## 2017-08-22 DIAGNOSIS — R112 Nausea with vomiting, unspecified: Secondary | ICD-10-CM

## 2017-08-22 NOTE — Telephone Encounter (Signed)
Wife called regarding Prednisone and the fact that a new prescription was not sent to pharmacy with the increase in dose. Please send refill  To pharmacy

## 2017-08-26 NOTE — Progress Notes (Deleted)
[facility]  Patient Care Team: [name], MD as PCP - General (Physician Assistant)  REASON FOR VISIT Treatment of Locally advanced kidney urothelial cancer  ONCOLOGY HISTORY Joseph Ritter 76 y.o. male with PMH listed as below  is referred to [place] for evaluation of locally advanced kidney urothelia cancer. Patient developed gross hematuria at the end of 2017. CT 08/2016 showed endophytic hypodense mass. Repeat image 01/2017 showed enlarged  endophytic hypodense mass right kidney. 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 Right robotic nephroureterectomy was attempted but aborted due to unresectable newly metastatic disease diagnosed intraoperatively. Postop he underwent a CT scan of the abdomen and pelvis which showed extension of an infiltrative mass from the right kidney along the right renal vein toward the IVC with encasement of the IVC.  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 He self catheterize a few time a day.  He reports having back pain which limits her ability to walk. Also constipated and abdominal bloating. His appetite is fair. He takes Norco for abdominal and back pain.  He can walk with walker for very short distance, mostly limited by back pain and fatigue./weakness.  # Images: CT chest and abdomen 05/03/2017 1. Pneumoperitoneum with a small amount of ascites, gas tracking along the thoracoabdominal wall into the scrotum, and a small amount of gas in the right perirenal space-these findings are likely related to the patient's attempted nephroureterectomy yesterday. Surveillance to ensure expected resolution of the pneumoperitoneum might be appropriate. 2. The right kidney lower pole infarct with lack of patency of the more inferior of the 3 right renal arteries. There is delayed nephrogram on the right with increase an infiltrative process in theright  kidney and extending into the right renal hilar vascular structures, including the right renal vein and the adjacent IVC, compatible with tumor thrombus. There is also soft tissue density surrounding the renal hilar vascular structures and extending into the retroperitoneum and periaortic region, some of which may be tumor and some of which may be hematoma.3. Demineralization of the anterior inferior L2 vertebral body,concerning for tumor invasion. This is near the level of the retroaortic left renal vein. 4. No findings of metastatic disease to the chest. There are new trace bilateral pleural effusions with mild passive atelectasis.  #Pathology: 05/02/2017 SPECIMEN SUBMITTED: A. Hilar tissue, right kidney B. Hilar tissue, right kidney DIAGNOSIS:  A and B. SOFT TISSUE, RIGHT RENAL HILUM  PD-L1 - Urothelial (TECENTRIQ) Immunohistochemistry Analysis  Result: 0% Interpretation: No expression   # Current Treatment: 1 Palliative RT to spine 2 Chemotherapy:     06/01/2017 Cycle 1 Gemcitabine and Carboplain.  Day 8 Gemcitabine was delayed due to intractable nausea and vomiting.     Chemotherapy related thrombocytopenia, no active bleeding. 50% dose of Gemcitabine was given on day 11.     06/25/2017 Cycle 2 Day 1 Gemcitabine and Carboplain.    07/03/2017 Cycle 2 Day 8 chemotherapy discontinued.      3 Immunotherapy:     07/27/2017 cycle 1 of Atezolizumab,        INTERVAL HISTORY 76 yo male who has above oncology history reviewed by me today presents for evaluation for immunotherapy. Reports feeling well. He still has back pain, for which pain medication helps, except when the pain exacerbates if he sits too long or has to switch position. His appetite is good. Per wife, he had two good meals  per days for the past couple of days.  He denies feeling nausea until this morning knowing that he is coming to clinic.   Review of Systems  Constitutional: Positive for fatigue. Negative for chills and fever.   HENT:   Negative for lump/mass.   Eyes: Negative for eye problems.  Respiratory: Negative for chest tightness and shortness of breath.   Cardiovascular: Negative for chest pain, leg swelling and palpitations.  Gastrointestinal: Positive for nausea. Negative for abdominal pain, blood in stool and vomiting.  Endocrine: Negative for hot flashes.  Genitourinary: Positive for difficulty urinating. Negative for dysuria and hematuria.   Musculoskeletal: Positive for back pain. Negative for flank pain.  Skin: Negative for itching.  Neurological: Negative for dizziness.  Hematological: Negative for adenopathy.  Psychiatric/Behavioral: The patient is not nervous/anxious.      MEDICAL HISTORY: Past Medical History:  Diagnosis Date  . Acid indigestion 03/09/2014  . Adult hypothyroidism 11/18/2012  . Anxiety   . Arteriosclerosis of coronary artery 11/18/2012   Overview:  PATENT LIMA TO LAD, PATENT SVG TO PDA AND OCCULUDED SVG TO OM2 BY CATHERIZATION 02/09/2009   . 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 Borderline diabetes 05/25/2014  . BP (high blood pressure) 11/18/2012  . BPH (benign prostatic hypertrophy)   . CAFL (chronic airflow limitation) ([place]) 05/03/2015  . Cancer ([place])    renal cancer with mets  . CCF (congestive cardiac failure) ([place]) 01/03/2014   Overview:  HX OF   . COPD (chronic obstructive pulmonary disease) ([place])   . DDD (degenerative disc disease), lumbar 06/29/2014  . 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 Gastric ulcer 03/09/2014  . 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 History of urinary self-catheterization 2017  . HLD (hyperlipidemia) 01/03/2014  . Incomplete bladder emptying 11/18/2012  . Leg weakness 11/18/2012  . Lumbar radiculitis   . Neuritis or radiculitis due to rupture of lumbar intervertebral disc 06/29/2014  . Overactive bladder    . PONV (postoperative nausea and vomiting)    years ago with Ether, no problem with Nausea or vomiting with the last few surgerys  . Pre-diabetes   . Subacute transverse myelitis ([place]) 11/21/2012  . Teeth problem    pt reports "bad teeth", "need to be pulled"    SURGICAL HISTORY: Past Surgical History:  Procedure Laterality Date  . ARTERY BIOPSY Right 12/16/2015   Procedure: BIOPSY TEMPORAL ARTERY;  Surgeon: Algernon Huxley, MD;  Location: [facility];  Service: Vascular;  Laterality: Right;  . CARDIAC CATHETERIZATION    . CATARACT EXTRACTION W/PHACO Left 01/26/2016   Procedure: CATARACT EXTRACTION PHACO AND INTRAOCULAR LENS PLACEMENT (IOC) LEFT EYE;  Surgeon: Leandrew Koyanagi, MD;  Location: [facility];  Service: Ophthalmology;  Laterality: Left;  . CORONARY ARTERY BYPASS GRAFT  04/07/2002   [facility]  . Cysto Bladder Botox Injection  07/02/2014  . CYSTOSCOPY W/ RETROGRADES Right 03/16/2017   Procedure: CYSTOSCOPY WITH RETROGRADE PYELOGRAM;  Surgeon: [name], MD;  Location: [facility];  Service: Urology;  Laterality: Right;  . CYSTOSCOPY WITH STENT PLACEMENT Right 03/16/2017   Procedure: CYSTOSCOPY WITH STENT PLACEMENT;  Surgeon: [name], MD;  Location: [facility];  Service: Urology;  Laterality: Right;  . EYE SURGERY Left    blood clot behind left eye  . HAND SURGERY Bilateral   . HERNIA REPAIR Right  inguinal hernia repair  . LEFT HEART CATH AND CORS/GRAFTS ANGIOGRAPHY N/A 04/25/2017   Procedure: Left Heart Cath and Cors/Grafts Angiography;  Surgeon: Isaias Cowman, MD;  Location: [facility];  Service: Cardiovascular;  Laterality: N/A;  . PORTA CATH INSERTION N/A 05/30/2017   Procedure: Glori Luis Cath Insertion;  Surgeon: Katha Cabal, MD;  Location: [place] [facility];  Service: Cardiovascular;  Laterality: N/A;  . ROBOT ASSITED LAPAROSCOPIC NEPHROURETERECTOMY Right 05/02/2017   Procedure: ROBOT ASSITED LAPAROSCOPIC NEPHROURETERECTOMY ATTEMPTED;   Surgeon: [name], MD;  Location: [facility];  Service: Urology;  Laterality: Right;  . URETEROSCOPY Right 03/16/2017   Procedure: URETEROSCOPY BIOPSY RENAL MASS;  Surgeon: [name], MD;  Location: [facility];  Service: Urology;  Laterality: Right;    SOCIAL HISTORY: Social History   Socioeconomic History  . Marital status: Married    Spouse name: Not on file  . Number of children: Not on file  . Years of education: Not on file  . Highest education level: Not on file  Social Needs  . Financial resource strain: Not on file  . Food insecurity - worry: Not on file  . Food insecurity - inability: Not on file  . Transportation needs - medical: Not on file  . Transportation needs - non-medical: Not on file  Occupational History  . Not on file  Tobacco Use  . Smoking status: Former Smoker    Types: Cigarettes    Last attempt to quit: 04/07/2002    Years since quitting: 15.3  . Smokeless tobacco: Former Systems developer    Types: Snuff, Chew  Substance and Sexual Activity  . Alcohol use: No    Alcohol/week: 0.0 oz  . Drug use: No  . Sexual activity: Not on file  Other Topics Concern  . Not on file  Social History Narrative  . Not on file    FAMILY HISTORY Family History  Problem Relation Age of Onset  . Heart attack Mother   . Heart disease Father   . Prostate cancer Neg Hx   . Bladder Cancer Neg Hx   . Kidney cancer Neg Hx     ALLERGIES:  has No Known Allergies.  MEDICATIONS:  Current Outpatient Medications  Medication Sig Dispense Refill  . apixaban (ELIQUIS) 5 MG TABS tablet Take 1 tablet (5 mg total) by mouth 2 (two) times daily. 60 tablet 0  . buPROPion (WELLBUTRIN SR) 100 MG 12 hr tablet TAKE ONE TABLET BY MOUTH EVERY DAY    . cyclobenzaprine (FLEXERIL) 10 MG tablet Take 10 mg by mouth at bedtime as needed for muscle spasms.    Marland Kitchen ezetimibe-simvastatin (VYTORIN) 10-40 MG tablet Take 1 tablet by mouth at bedtime.    . feeding supplement, ENSURE ENLIVE,  (ENSURE ENLIVE) LIQD Take 237 mLs by mouth 2 (two) times daily between meals. 60 Bottle 0  . HYDROcodone-acetaminophen (NORCO) 7.5-325 MG tablet Take 1 tablet by mouth every 4 (four) hours as needed for moderate pain. 180 tablet 0  . 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    Marland Kitchen ketoconazole (NIZORAL) 2 % shampoo APPLY SHAMPOO TO HAIR AS NEEDED FOR FLAKY/ITCHY SCALP (TYPICALLY EVERY 2-3 DAYS)    . levothyroxine (SYNTHROID, LEVOTHROID) 125 MCG tablet TAKE ONE TABLET BY MOUTH EVERY DAY BEFORE BREAKFAST 30 tablet 0  . lidocaine (LIDODERM) 5 % Place 1 patch onto the skin every 12 (twelve)  hours. Remove & Discard patch within 12 hours or as directed by MD 60 patch 11  . LORazepam (ATIVAN) 0.5 MG tablet Take 1 tablet (0.5 mg total) by mouth every 8 (eight) hours as needed for anxiety (nausea). (Patient not taking: Reported on 07/27/2017) 30 tablet 0  . morphine (MS CONTIN) 30 MG 12 hr tablet Take 2 tablets (60 mg total) by mouth every 12 (twelve) hours. 120 tablet 0  . Multiple Vitamin (MULTIVITAMIN WITH MINERALS) TABS tablet Take 1 tablet by mouth daily after breakfast. CENTRUM SILVER    . nystatin (MYCOSTATIN) 100000 UNIT/ML suspension   2  . PEPPERMINT OIL PO Take 1 capsule by mouth 3 (three) times daily as needed (for IBS). IBgard (active ingredient: 90 mg ultrapurified peppermint oil)    . polyethylene glycol powder (GLYCOLAX/MIRALAX) powder Take 17 g by mouth daily after breakfast. Mix with glass of water 255 g 1  . predniSONE (DELTASONE) 10 MG tablet TAKE ONE TABLET BY MOUTH EVERY DAY WITH BREAKFAST 30 tablet 0  . promethazine (PHENERGAN) 25 MG suppository Place 1 suppository (25 mg total) rectally every 6 (six) hours as needed for nausea. (Patient not taking: Reported on 08/06/2017) 30 suppository 0  . promethazine (PHENERGAN) 25 MG tablet Take 1 tablet (25 mg total) by mouth every 6  (six) hours as needed for nausea or vomiting. 30 tablet 0  . RABEprazole (ACIPHEX) 20 MG tablet Take 20 mg by mouth daily before breakfast.     . ramipril (ALTACE) 10 MG capsule Take 10 mg by mouth daily after breakfast.    . SENNA LAX 8.6 MG tablet TAKE TWO TABLETS BY MOUTH DAILY 120 tablet 3  . sertraline (ZOLOFT) 100 MG tablet Take 100 mg by mouth daily after breakfast.    . sodium phosphate (FLEET) 7-19 GM/118ML ENEM Place 133 mLs (1 enema total) rectally daily as needed for severe constipation. (Patient not taking: Reported on 07/27/2017) 5 enema 0  . tamsulosin (FLOMAX) 0.4 MG CAPS capsule Take 0.8 mg by mouth daily after supper.      No current facility-administered medications for this visit.    Facility-Administered Medications Ordered in Other Visits  Medication Dose Route Frequency Provider Last Rate Last Dose  . heparin lock flush 100 unit/mL  500 Units Intravenous Once Earlie Server, MD      . sodium chloride flush (NS) 0.9 % injection 10 mL  10 mL Intravenous PRN Earlie Server, MD   10 mL at 07/03/17 0930    PHYSICAL EXAMINATION:  ECOG PERFORMANCE STATUS: 2 - Symptomatic, <50% confined to bed  There were no vitals filed for this visit.  There were no vitals filed for this visit.   Physical Exam  Constitutional: He is oriented to person, place, and time. No distress.  HENT:  Head: Normocephalic and atraumatic.  Eyes: Conjunctivae and EOM are normal. Pupils are equal, round, and reactive to light. Left eye exhibits no discharge.  Neck: Normal range of motion. Neck supple. No JVD present. No tracheal deviation present.  Cardiovascular: Normal rate and regular rhythm.  No murmur heard. Pulmonary/Chest: No stridor. No respiratory distress. He has no wheezes. He has no rales.  Abdominal: He exhibits no distension. There is no tenderness.  Genitourinary:  Genitourinary Comments: Self catheraization  Musculoskeletal: Normal range of motion. He exhibits no edema.  Palpable spinal  tenderness.  Lymphadenopathy:    He has no cervical adenopathy.  Neurological: He is alert and oriented to person, place, and time. No cranial nerve deficit.  Skin: Skin is warm. No rash noted. No erythema.  Psychiatric: Affect normal.    ECOG 2  LABORATORY DATA: I have personally reviewed the data as listed: CBC    Component Value Date/Time   WBC 11.2 (H) 08/20/2017 0930   RBC 3.30 (L) 08/20/2017 0930   HGB 9.8 (L) 08/20/2017 0930   HGB 15.3 11/15/2012 1641   HCT 29.3 (L) 08/20/2017 0930   HCT 43.7 11/15/2012 1641   PLT 187 08/20/2017 0930   PLT 166 11/15/2012 1641   MCV 88.7 08/20/2017 0930   MCV 89 11/15/2012 1641   MCH 29.7 08/20/2017 0930   MCHC 33.5 08/20/2017 0930   RDW 21.9 (H) 08/20/2017 0930   RDW 13.3 11/15/2012 1641   LYMPHSABS 1.4 08/20/2017 0930   LYMPHSABS 1.7 11/15/2012 1641   MONOABS 0.7 08/20/2017 0930   MONOABS 0.8 11/15/2012 1641   EOSABS 0.2 08/20/2017 0930   EOSABS 0.1 11/15/2012 1641   BASOSABS 0.0 08/20/2017 0930   BASOSABS 0.0 11/15/2012 1641   CMP Latest Ref Rng & Units 08/20/2017 08/13/2017 08/06/2017  Glucose 65 - 99 mg/dL 139(H) 141(H) 177(H)  BUN 6 - 20 mg/dL 23(H) 19 18  Creatinine 0.61 - 1.24 mg/dL 1.10 0.95 0.96  Sodium 135 - 145 mmol/L 133(L) 135 135  Potassium 3.5 - 5.1 mmol/L 4.5 4.8 4.4  Chloride 101 - 111 mmol/L 98(L) 99(L) 94(L)  CO2 22 - 32 mmol/L 24 25 26   Calcium 8.9 - 10.3 mg/dL 9.0 8.6(L) 9.8  Total Protein 6.5 - 8.1 g/dL 7.2 7.0 7.5  Total Bilirubin 0.3 - 1.2 mg/dL 0.6 0.9 0.5  Alkaline Phos 38 - 126 U/L 124 111 112  AST 15 - 41 U/L 106(H) 61(H) 37  ALT 17 - 63 U/L 243(H) 101(H) 55     RADIOGRAPHIC STUDIES: I have personally reviewed the radiological images as listed and agree with the findings in the report 5.  Aortic Atherosclerosis (ICD10-I70.0).  Coronary atherosclerosis. 6. Inflammatory stranding from the right perirenal space extends down into the pelvis and crosses the midline. There is presacral edema. 7.  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 Recommend followup by ultrasound in 3 years. This recommendation follows ACR consensus guidelines: White Paper of the ACR Incidental Findings Committee II on Vascular Findings. J Am Coll Radiol 2013; 16:837-290 8. Wall thickening along the left side of the urinary bladder may be incidental, but synchronous sessile transitional cell carcinoma is not readily excluded.  CT abdomen wo contrast 02/09/2017  Increased size of 4.6 cm ill-defined hypoenhancing mass ininterpolar region of right kidney, with obstruction of upper pole collecting system. This does not have typical appearance for pyelonephritis or renal cell carcinoma, and raises suspicion for urothelial carcinoma. Consider ureteroscopy for further evaluation. No evidence of metastatic disease. Stable mildly enlarged prostate and mild diffuse bladder wall thickening. Stable 3.0 cm infrarenal abdominal aortic aneurysm. Recommend followup by ultrasound in 3 years. This recommendation follows ACR consensus guidelines: White Paper of the ACR Incidental Findings Committee II on Vascular Findings. [place] [phone]. Colonic diverticulosis. No radiographic evidence of diverticulitis. Stable small fat-containing left inguinal hernia. MRI thoracic and lumbar w/wo contrast.  1. Malignant invasion of the L2 vertebral body by the adjacent retroperitoneal mass. No pathologic fracture or osseous metastatic disease elsewhere in the thoracolumbar spine. 2. Areas of epidural contrast enhancement of the lumbar spine are most consistent with a dilated epidural venous plexus secondary to invasion of the inferior vena cava by the patient's urothelial tumor.  3. Moderate L4-L5 multifactorial spinal canal stenosis with severe right and mild-to-moderate left neural foraminal stenosis. 4. Moderate right and severe left multifactorial L5-S1 neural foraminal stenosis. There is also  severe attenuation of the thecal sac at this level but no bony spinal canal stenosis. 5. No spinal canal or neural foraminal stenosis in the thoracic spine.   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 # Tolerated cycle 1 Atezulizulab well. # Grade 2 transaminitis, likely hepatoxicity from immunotherapy. Hold today's treatment.     He is taking prednisone 68m daily, increase to 221mdaily. Repeat labs in 1 week   # Neoplasm related bone pain: continue current pain regimen. Refilled MS contin today.   XgDelton See28 days scheduled through march next year.  #  Constipation: continue current bowel regimen   #Hypothyroidism on Synthroid 12559mdaily. TSH trending down. Continue current dosage.   Follow up on 08/27/2017 Lab/MD/immnotherapy.   ZhoEarlie ServerD  08/26/2017 10:45 PM

## 2017-08-27 ENCOUNTER — Inpatient Hospital Stay: Payer: Medicare Other

## 2017-08-27 ENCOUNTER — Inpatient Hospital Stay: Payer: Medicare Other | Admitting: Oncology

## 2017-08-27 ENCOUNTER — Inpatient Hospital Stay: Payer: Medicare Other | Attending: Oncology

## 2017-08-27 DIAGNOSIS — M549 Dorsalgia, unspecified: Secondary | ICD-10-CM | POA: Insufficient documentation

## 2017-08-27 DIAGNOSIS — C651 Malignant neoplasm of right renal pelvis: Secondary | ICD-10-CM | POA: Insufficient documentation

## 2017-08-27 DIAGNOSIS — R112 Nausea with vomiting, unspecified: Secondary | ICD-10-CM | POA: Insufficient documentation

## 2017-08-27 DIAGNOSIS — N183 Chronic kidney disease, stage 3 (moderate): Secondary | ICD-10-CM | POA: Insufficient documentation

## 2017-08-27 DIAGNOSIS — R42 Dizziness and giddiness: Secondary | ICD-10-CM | POA: Insufficient documentation

## 2017-08-27 DIAGNOSIS — Z79899 Other long term (current) drug therapy: Secondary | ICD-10-CM | POA: Insufficient documentation

## 2017-08-27 DIAGNOSIS — R740 Nonspecific elevation of levels of transaminase and lactic acid dehydrogenase [LDH]: Secondary | ICD-10-CM | POA: Insufficient documentation

## 2017-08-27 DIAGNOSIS — I110 Hypertensive heart disease with heart failure: Secondary | ICD-10-CM | POA: Insufficient documentation

## 2017-08-27 DIAGNOSIS — C641 Malignant neoplasm of right kidney, except renal pelvis: Secondary | ICD-10-CM | POA: Insufficient documentation

## 2017-08-27 DIAGNOSIS — Z906 Acquired absence of other parts of urinary tract: Secondary | ICD-10-CM | POA: Insufficient documentation

## 2017-08-27 DIAGNOSIS — R5383 Other fatigue: Secondary | ICD-10-CM | POA: Insufficient documentation

## 2017-08-27 DIAGNOSIS — N3281 Overactive bladder: Secondary | ICD-10-CM | POA: Insufficient documentation

## 2017-08-27 DIAGNOSIS — J449 Chronic obstructive pulmonary disease, unspecified: Secondary | ICD-10-CM | POA: Insufficient documentation

## 2017-08-27 DIAGNOSIS — Z7952 Long term (current) use of systemic steroids: Secondary | ICD-10-CM | POA: Insufficient documentation

## 2017-08-27 DIAGNOSIS — C679 Malignant neoplasm of bladder, unspecified: Secondary | ICD-10-CM | POA: Insufficient documentation

## 2017-08-27 DIAGNOSIS — C7951 Secondary malignant neoplasm of bone: Secondary | ICD-10-CM | POA: Insufficient documentation

## 2017-08-27 DIAGNOSIS — Z87891 Personal history of nicotine dependence: Secondary | ICD-10-CM | POA: Insufficient documentation

## 2017-08-27 DIAGNOSIS — K5900 Constipation, unspecified: Secondary | ICD-10-CM | POA: Insufficient documentation

## 2017-08-27 DIAGNOSIS — E785 Hyperlipidemia, unspecified: Secondary | ICD-10-CM | POA: Insufficient documentation

## 2017-08-27 DIAGNOSIS — N390 Urinary tract infection, site not specified: Secondary | ICD-10-CM | POA: Insufficient documentation

## 2017-08-27 DIAGNOSIS — I714 Abdominal aortic aneurysm, without rupture: Secondary | ICD-10-CM | POA: Insufficient documentation

## 2017-08-27 DIAGNOSIS — I509 Heart failure, unspecified: Secondary | ICD-10-CM | POA: Insufficient documentation

## 2017-08-27 DIAGNOSIS — E039 Hypothyroidism, unspecified: Secondary | ICD-10-CM | POA: Insufficient documentation

## 2017-08-27 DIAGNOSIS — Z905 Acquired absence of kidney: Secondary | ICD-10-CM | POA: Insufficient documentation

## 2017-08-27 DIAGNOSIS — Z9225 Personal history of immunosupression therapy: Secondary | ICD-10-CM | POA: Insufficient documentation

## 2017-08-27 DIAGNOSIS — R531 Weakness: Secondary | ICD-10-CM | POA: Insufficient documentation

## 2017-08-27 DIAGNOSIS — I2510 Atherosclerotic heart disease of native coronary artery without angina pectoris: Secondary | ICD-10-CM | POA: Insufficient documentation

## 2017-08-27 DIAGNOSIS — R7303 Prediabetes: Secondary | ICD-10-CM | POA: Insufficient documentation

## 2017-08-27 DIAGNOSIS — N400 Enlarged prostate without lower urinary tract symptoms: Secondary | ICD-10-CM | POA: Insufficient documentation

## 2017-08-27 DIAGNOSIS — Z951 Presence of aortocoronary bypass graft: Secondary | ICD-10-CM | POA: Insufficient documentation

## 2017-08-27 DIAGNOSIS — T451X5S Adverse effect of antineoplastic and immunosuppressive drugs, sequela: Secondary | ICD-10-CM | POA: Insufficient documentation

## 2017-08-27 DIAGNOSIS — E860 Dehydration: Secondary | ICD-10-CM | POA: Insufficient documentation

## 2017-08-27 DIAGNOSIS — G893 Neoplasm related pain (acute) (chronic): Secondary | ICD-10-CM | POA: Insufficient documentation

## 2017-08-27 DIAGNOSIS — I959 Hypotension, unspecified: Secondary | ICD-10-CM | POA: Insufficient documentation

## 2017-08-27 DIAGNOSIS — D6959 Other secondary thrombocytopenia: Secondary | ICD-10-CM | POA: Insufficient documentation

## 2017-08-27 DIAGNOSIS — N319 Neuromuscular dysfunction of bladder, unspecified: Secondary | ICD-10-CM | POA: Insufficient documentation

## 2017-08-27 NOTE — Progress Notes (Signed)
Nutrition  Was planning to meet with patient today during infusion but cancelled for today.    Jeannene Tschetter [name], [place], [place] Registered Dietitian [phone] (pager)

## 2017-08-31 ENCOUNTER — Inpatient Hospital Stay: Payer: Medicare Other

## 2017-08-31 ENCOUNTER — Other Ambulatory Visit: Payer: Self-pay

## 2017-08-31 ENCOUNTER — Encounter: Payer: Self-pay | Admitting: Oncology

## 2017-08-31 ENCOUNTER — Inpatient Hospital Stay (HOSPITAL_BASED_OUTPATIENT_CLINIC_OR_DEPARTMENT_OTHER): Payer: Medicare Other | Admitting: Oncology

## 2017-08-31 VITALS — BP 124/57 | HR 60 | Temp 95.3°F | Wt 136.0 lb

## 2017-08-31 DIAGNOSIS — Z87891 Personal history of nicotine dependence: Secondary | ICD-10-CM

## 2017-08-31 DIAGNOSIS — E785 Hyperlipidemia, unspecified: Secondary | ICD-10-CM

## 2017-08-31 DIAGNOSIS — R740 Nonspecific elevation of levels of transaminase and lactic acid dehydrogenase [LDH]: Secondary | ICD-10-CM | POA: Diagnosis not present

## 2017-08-31 DIAGNOSIS — T50905A Adverse effect of unspecified drugs, medicaments and biological substances, initial encounter: Secondary | ICD-10-CM

## 2017-08-31 DIAGNOSIS — R300 Dysuria: Secondary | ICD-10-CM

## 2017-08-31 DIAGNOSIS — Z7952 Long term (current) use of systemic steroids: Secondary | ICD-10-CM

## 2017-08-31 DIAGNOSIS — R112 Nausea with vomiting, unspecified: Secondary | ICD-10-CM | POA: Diagnosis not present

## 2017-08-31 DIAGNOSIS — R42 Dizziness and giddiness: Secondary | ICD-10-CM | POA: Diagnosis not present

## 2017-08-31 DIAGNOSIS — C679 Malignant neoplasm of bladder, unspecified: Secondary | ICD-10-CM

## 2017-08-31 DIAGNOSIS — Z906 Acquired absence of other parts of urinary tract: Secondary | ICD-10-CM

## 2017-08-31 DIAGNOSIS — C7951 Secondary malignant neoplasm of bone: Secondary | ICD-10-CM

## 2017-08-31 DIAGNOSIS — K5900 Constipation, unspecified: Secondary | ICD-10-CM | POA: Diagnosis not present

## 2017-08-31 DIAGNOSIS — M549 Dorsalgia, unspecified: Secondary | ICD-10-CM

## 2017-08-31 DIAGNOSIS — N3281 Overactive bladder: Secondary | ICD-10-CM | POA: Diagnosis not present

## 2017-08-31 DIAGNOSIS — C641 Malignant neoplasm of right kidney, except renal pelvis: Secondary | ICD-10-CM

## 2017-08-31 DIAGNOSIS — E860 Dehydration: Secondary | ICD-10-CM | POA: Diagnosis not present

## 2017-08-31 DIAGNOSIS — D6959 Other secondary thrombocytopenia: Secondary | ICD-10-CM

## 2017-08-31 DIAGNOSIS — T451X5S Adverse effect of antineoplastic and immunosuppressive drugs, sequela: Secondary | ICD-10-CM | POA: Diagnosis not present

## 2017-08-31 DIAGNOSIS — G893 Neoplasm related pain (acute) (chronic): Secondary | ICD-10-CM | POA: Diagnosis not present

## 2017-08-31 DIAGNOSIS — Z905 Acquired absence of kidney: Secondary | ICD-10-CM | POA: Diagnosis not present

## 2017-08-31 DIAGNOSIS — C651 Malignant neoplasm of right renal pelvis: Secondary | ICD-10-CM | POA: Diagnosis not present

## 2017-08-31 DIAGNOSIS — I509 Heart failure, unspecified: Secondary | ICD-10-CM

## 2017-08-31 DIAGNOSIS — Z79899 Other long term (current) drug therapy: Secondary | ICD-10-CM

## 2017-08-31 DIAGNOSIS — N319 Neuromuscular dysfunction of bladder, unspecified: Secondary | ICD-10-CM

## 2017-08-31 DIAGNOSIS — N183 Chronic kidney disease, stage 3 (moderate): Secondary | ICD-10-CM | POA: Diagnosis not present

## 2017-08-31 DIAGNOSIS — R5383 Other fatigue: Secondary | ICD-10-CM

## 2017-08-31 DIAGNOSIS — E039 Hypothyroidism, unspecified: Secondary | ICD-10-CM | POA: Diagnosis not present

## 2017-08-31 DIAGNOSIS — I714 Abdominal aortic aneurysm, without rupture: Secondary | ICD-10-CM

## 2017-08-31 DIAGNOSIS — K716 Toxic liver disease with hepatitis, not elsewhere classified: Secondary | ICD-10-CM

## 2017-08-31 DIAGNOSIS — R531 Weakness: Secondary | ICD-10-CM

## 2017-08-31 DIAGNOSIS — I959 Hypotension, unspecified: Secondary | ICD-10-CM | POA: Diagnosis not present

## 2017-08-31 DIAGNOSIS — N400 Enlarged prostate without lower urinary tract symptoms: Secondary | ICD-10-CM

## 2017-08-31 DIAGNOSIS — N390 Urinary tract infection, site not specified: Secondary | ICD-10-CM | POA: Diagnosis not present

## 2017-08-31 DIAGNOSIS — R7303 Prediabetes: Secondary | ICD-10-CM

## 2017-08-31 DIAGNOSIS — Z9225 Personal history of immunosupression therapy: Secondary | ICD-10-CM | POA: Diagnosis not present

## 2017-08-31 DIAGNOSIS — J449 Chronic obstructive pulmonary disease, unspecified: Secondary | ICD-10-CM

## 2017-08-31 DIAGNOSIS — Z5112 Encounter for antineoplastic immunotherapy: Secondary | ICD-10-CM

## 2017-08-31 DIAGNOSIS — Z951 Presence of aortocoronary bypass graft: Secondary | ICD-10-CM

## 2017-08-31 DIAGNOSIS — I2510 Atherosclerotic heart disease of native coronary artery without angina pectoris: Secondary | ICD-10-CM

## 2017-08-31 LAB — CBC WITH DIFFERENTIAL/PLATELET
BASOS ABS: 0 10*3/uL (ref 0–0.1)
BASOS PCT: 0 %
EOS ABS: 0.3 10*3/uL (ref 0–0.7)
EOS PCT: 4 %
HEMATOCRIT: 29.5 % — AB (ref 40.0–52.0)
Hemoglobin: 9.8 g/dL — ABNORMAL LOW (ref 13.0–18.0)
Lymphocytes Relative: 14 %
Lymphs Abs: 1.1 10*3/uL (ref 1.0–3.6)
MCH: 28.9 pg (ref 26.0–34.0)
MCHC: 33 g/dL (ref 32.0–36.0)
MCV: 87.6 fL (ref 80.0–100.0)
MONO ABS: 0.7 10*3/uL (ref 0.2–1.0)
MONOS PCT: 9 %
NEUTROS ABS: 6.2 10*3/uL (ref 1.4–6.5)
Neutrophils Relative %: 73 %
PLATELETS: 258 10*3/uL (ref 150–440)
RBC: 3.37 MIL/uL — ABNORMAL LOW (ref 4.40–5.90)
RDW: 20.2 % — AB (ref 11.5–14.5)
WBC: 8.4 10*3/uL (ref 3.8–10.6)

## 2017-08-31 LAB — URINALYSIS, COMPLETE (UACMP) WITH MICROSCOPIC
BILIRUBIN URINE: NEGATIVE
Glucose, UA: NEGATIVE mg/dL
Ketones, ur: 5 mg/dL — AB
NITRITE: NEGATIVE
PH: 7 (ref 5.0–8.0)
Protein, ur: 100 mg/dL — AB
Specific Gravity, Urine: 1.013 (ref 1.005–1.030)

## 2017-08-31 LAB — COMPREHENSIVE METABOLIC PANEL
ALBUMIN: 2.8 g/dL — AB (ref 3.5–5.0)
ALK PHOS: 147 U/L — AB (ref 38–126)
ALT: 155 U/L — ABNORMAL HIGH (ref 17–63)
ANION GAP: 12 (ref 5–15)
AST: 55 U/L — ABNORMAL HIGH (ref 15–41)
BILIRUBIN TOTAL: 0.6 mg/dL (ref 0.3–1.2)
BUN: 15 mg/dL (ref 6–20)
CALCIUM: 8.3 mg/dL — AB (ref 8.9–10.3)
CO2: 23 mmol/L (ref 22–32)
CREATININE: 0.88 mg/dL (ref 0.61–1.24)
Chloride: 95 mmol/L — ABNORMAL LOW (ref 101–111)
GFR calc Af Amer: >60 mL/min (ref >60)
GFR calc non Af Amer: >60 mL/min (ref >60)
GLUCOSE: 108 mg/dL — AB (ref 65–99)
Potassium: 4.3 mmol/L (ref 3.5–5.1)
SODIUM: 130 mmol/L — AB (ref 135–145)
TOTAL PROTEIN: 7.1 g/dL (ref 6.5–8.1)

## 2017-08-31 LAB — TSH: TSH: 10.37 u[IU]/mL — ABNORMAL HIGH (ref 0.350–4.500)

## 2017-08-31 MED ORDER — HEPARIN SOD (PORK) LOCK FLUSH 100 UNIT/ML IV SOLN
500.0000 [IU] | Freq: Once | INTRAVENOUS | Status: AC
  Administered 2017-08-31: 500 [IU] via INTRAVENOUS

## 2017-08-31 MED ORDER — NITROFURANTOIN MONOHYD MACRO 100 MG PO CAPS: 100.0000 mg | ORAL_CAPSULE | Freq: Two times a day (BID) | ORAL | 0 refills | Status: DC

## 2017-08-31 MED ORDER — HEPARIN SOD (PORK) LOCK FLUSH 100 UNIT/ML IV SOLN
INTRAVENOUS | Status: AC
  Filled 2017-08-31: qty 5

## 2017-08-31 MED ORDER — DENOSUMAB 120 MG/1.7ML ~~LOC~~ SOLN
120.0000 mg | Freq: Once | SUBCUTANEOUS | Status: AC
Start: 2017-08-31 — End: 2017-08-31
  Administered 2017-08-31: 120 mg via SUBCUTANEOUS
  Filled 2017-08-31: qty 1.7

## 2017-08-31 MED ORDER — MORPHINE SULFATE 15 MG PO TABS: 15.0000 mg | ORAL_TABLET | Freq: Four times a day (QID) | ORAL | 0 refills | Status: DC | PRN

## 2017-08-31 NOTE — Progress Notes (Signed)
Ca =8.3, albumin =2.8, corrected calcium =9.3

## 2017-08-31 NOTE — Progress Notes (Signed)
Patient here today for follow up.  Patient c/o dysuria.

## 2017-08-31 NOTE — Progress Notes (Signed)
[facility] Follow Up visit  Patient Care Team: [name], MD as PCP - General (Physician Assistant)  REASON FOR VISIT Treatment of Locally advanced kidney urothelial cancer  ONCOLOGY HISTORY Joseph Ritter 76 y.o. male with PMH listed as below  is referred to [place] for evaluation of locally advanced kidney urothelia cancer. Patient developed gross hematuria at the end of 2017. CT 08/2016 showed endophytic hypodense mass. Repeat image 01/2017 showed enlarged  endophytic hypodense mass right kidney. 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 Right robotic nephroureterectomy was attempted but aborted due to unresectable newly metastatic disease diagnosed intraoperatively. Postop he underwent a CT scan of the abdomen and pelvis which showed extension of an infiltrative mass from the right kidney along the right renal vein toward the IVC with encasement of the IVC.  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 He self catheterize a few time a day.  He reports having back pain which limits her ability to walk. Also constipated and abdominal bloating. His appetite is fair. He takes Norco for abdominal and back pain.  He can walk with walker for very short distance, mostly limited by back pain and fatigue./weakness.  # Images: CT chest and abdomen 05/03/2017 1. Pneumoperitoneum with a small amount of ascites, gas tracking along the thoracoabdominal wall into the scrotum, and a small amount of gas in the right perirenal space-these findings are likely related to the patient's attempted nephroureterectomy yesterday. Surveillance to ensure expected resolution of the pneumoperitoneum might be appropriate. 2. The right kidney lower pole infarct with lack of patency of the more inferior of the 3 right renal arteries. There is delayed nephrogram on the right with increase an infiltrative process in theright  kidney and extending into the right renal hilar vascular structures, including the right renal vein and the adjacent IVC, compatible with tumor thrombus. There is also soft tissue density surrounding the renal hilar vascular structures and extending into the retroperitoneum and periaortic region, some of which may be tumor and some of which may be hematoma.3. Demineralization of the anterior inferior L2 vertebral body,concerning for tumor invasion. This is near the level of the retroaortic left renal vein. 4. No findings of metastatic disease to the chest. There are new trace bilateral pleural effusions with mild passive atelectasis.  #Pathology: 05/02/2017 SPECIMEN SUBMITTED: A. Hilar tissue, right kidney B. Hilar tissue, right kidney DIAGNOSIS:  A and B. SOFT TISSUE, RIGHT RENAL HILUM  PD-L1 - Urothelial (TECENTRIQ) Immunohistochemistry Analysis  Result: 0% Interpretation: No expression   # Current Treatment: 1 Palliative RT to spine 2 Chemotherapy:     06/01/2017 Cycle 1 Gemcitabine and Carboplain.  Day 8 Gemcitabine was delayed due to intractable nausea and vomiting.     Chemotherapy related thrombocytopenia, no active bleeding. 50% dose of Gemcitabine was given on day 11.     06/25/2017 Cycle 2 Day 1 Gemcitabine and Carboplain.    07/03/2017 Cycle 2 Day 8 chemotherapy discontinued.      3 Immunotherapy:     07/27/2017 cycle 1 of Atezolizumab, 11/26 2018 cycle 2 Atezolizumab was held due to hepatic toxicity.        INTERVAL HISTORY 76 yo male who has above oncology history reviewed by me today presents for evaluation for immunotherapy. Reports feeling bladder discomfort. He has chronic indwelling foley catheter. Denies fever or chills. Feels weak.    Review of Systems  Constitutional: Positive for fatigue. Negative  for chills and fever.  HENT:   Negative for lump/mass.   Eyes: Negative for eye problems.  Respiratory: Negative for chest tightness and shortness of breath.    Cardiovascular: Negative for chest pain, leg swelling and palpitations.  Gastrointestinal: Positive for nausea. Negative for abdominal pain, blood in stool and vomiting.  Endocrine: Negative for hot flashes.  Genitourinary: Positive for difficulty urinating and dysuria. Negative for hematuria.   Musculoskeletal: Positive for back pain. Negative for flank pain.  Skin: Negative for itching.  Neurological: Negative for dizziness.  Hematological: Negative for adenopathy.  Psychiatric/Behavioral: The patient is not nervous/anxious.      MEDICAL HISTORY: Past Medical History:  Diagnosis Date  . Acid indigestion 03/09/2014  . Adult hypothyroidism 11/18/2012  . Anxiety   . Arteriosclerosis of coronary artery 11/18/2012   Overview:  PATENT LIMA TO LAD, PATENT SVG TO PDA AND OCCULUDED SVG TO OM2 BY CATHERIZATION 02/09/2009   . 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 Borderline diabetes 05/25/2014  . BP (high blood pressure) 11/18/2012  . BPH (benign prostatic hypertrophy)   . CAFL (chronic airflow limitation) ([place]) 05/03/2015  . Cancer ([place])    renal cancer with mets  . CCF (congestive cardiac failure) ([place]) 01/03/2014   Overview:  HX OF   . COPD (chronic obstructive pulmonary disease) ([place])   . DDD (degenerative disc disease), lumbar 06/29/2014  . 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 Gastric ulcer 03/09/2014  . 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 History of urinary self-catheterization 2017  . HLD (hyperlipidemia) 01/03/2014  . Incomplete bladder emptying 11/18/2012  . Leg weakness 11/18/2012  . Lumbar radiculitis   . Neuritis or radiculitis due to rupture of lumbar intervertebral disc 06/29/2014  . Overactive bladder   . PONV (postoperative nausea and vomiting)    years ago with Ether, no problem with Nausea or vomiting with the last few surgerys  .  Pre-diabetes   . Subacute transverse myelitis ([place]) 11/21/2012  . Teeth problem    pt reports "bad teeth", "need to be pulled"    SURGICAL HISTORY: Past Surgical History:  Procedure Laterality Date  . ARTERY BIOPSY Right 12/16/2015   Procedure: BIOPSY TEMPORAL ARTERY;  Surgeon: Algernon Huxley, MD;  Location: [facility];  Service: Vascular;  Laterality: Right;  . CARDIAC CATHETERIZATION    . CATARACT EXTRACTION W/PHACO Left 01/26/2016   Procedure: CATARACT EXTRACTION PHACO AND INTRAOCULAR LENS PLACEMENT (IOC) LEFT EYE;  Surgeon: Leandrew Koyanagi, MD;  Location: [facility];  Service: Ophthalmology;  Laterality: Left;  . CORONARY ARTERY BYPASS GRAFT  04/07/2002   [facility]  . Cysto Bladder Botox Injection  07/02/2014  . CYSTOSCOPY W/ RETROGRADES Right 03/16/2017   Procedure: CYSTOSCOPY WITH RETROGRADE PYELOGRAM;  Surgeon: [name], MD;  Location: [facility];  Service: Urology;  Laterality: Right;  . CYSTOSCOPY WITH STENT PLACEMENT Right 03/16/2017   Procedure: CYSTOSCOPY WITH STENT PLACEMENT;  Surgeon: [name], MD;  Location: [facility];  Service: Urology;  Laterality: Right;  . EYE SURGERY Left    blood clot behind left eye  . HAND SURGERY Bilateral   . HERNIA REPAIR Right    inguinal hernia repair  . LEFT HEART CATH AND CORS/GRAFTS ANGIOGRAPHY N/A 04/25/2017   Procedure: Left Heart Cath and Cors/Grafts Angiography;  Surgeon: Isaias Cowman, MD;  Location: [facility];  Service: Cardiovascular;  Laterality: N/A;  . PORTA CATH INSERTION N/A 05/30/2017   Procedure: Glori Luis Cath Insertion;  Surgeon: Katha Cabal, MD;  Location: [facility];  Service: Cardiovascular;  Laterality: N/A;  . ROBOT ASSITED LAPAROSCOPIC NEPHROURETERECTOMY Right 05/02/2017   Procedure: ROBOT ASSITED LAPAROSCOPIC NEPHROURETERECTOMY ATTEMPTED;  Surgeon: [name], MD;  Location: [facility];  Service: Urology;  Laterality: Right;  . URETEROSCOPY Right 03/16/2017   Procedure:  URETEROSCOPY BIOPSY RENAL MASS;  Surgeon: [name], MD;  Location: [facility];  Service: Urology;  Laterality: Right;    SOCIAL HISTORY: Social History   Socioeconomic History  . Marital status: Married    Spouse name: Not on file  . Number of children: Not on file  . Years of education: Not on file  . Highest education level: Not on file  Social Needs  . Financial resource strain: Not on file  . Food insecurity - worry: Not on file  . Food insecurity - inability: Not on file  . Transportation needs - medical: Not on file  . Transportation needs - non-medical: Not on file  Occupational History  . Not on file  Tobacco Use  . Smoking status: Former Smoker    Types: Cigarettes    Last attempt to quit: 04/07/2002    Years since quitting: 15.4  . Smokeless tobacco: Former Systems developer    Types: Snuff, Chew  Substance and Sexual Activity  . Alcohol use: No    Alcohol/week: 0.0 oz  . Drug use: No  . Sexual activity: Not on file  Other Topics Concern  . Not on file  Social History Narrative  . Not on file    FAMILY HISTORY Family History  Problem Relation Age of Onset  . Heart attack Mother   . Heart disease Father   . Prostate cancer Neg Hx   . Bladder Cancer Neg Hx   . Kidney cancer Neg Hx     ALLERGIES:  has No Known Allergies.  MEDICATIONS:  Current Outpatient Medications  Medication Sig Dispense Refill  . apixaban (ELIQUIS) 5 MG TABS tablet Take 1 tablet (5 mg total) by mouth 2 (two) times daily. 60 tablet 0  . buPROPion (WELLBUTRIN SR) 100 MG 12 hr tablet TAKE ONE TABLET BY MOUTH EVERY DAY    . cyclobenzaprine (FLEXERIL) 10 MG tablet Take 10 mg by mouth at bedtime as needed for muscle spasms.    Marland Kitchen ezetimibe-simvastatin (VYTORIN) 10-40 MG tablet Take 1 tablet by mouth at bedtime.    . feeding supplement, ENSURE ENLIVE, (ENSURE ENLIVE) LIQD Take 237 mLs by mouth 2 (two) times daily between meals. 60 Bottle 0  . HYDROcodone-acetaminophen (NORCO) 7.5-325 MG tablet  Take 1 tablet by mouth every 4 (four) hours as needed for moderate pain. 180 tablet 0  . 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    Marland Kitchen ketoconazole (NIZORAL) 2 % shampoo APPLY SHAMPOO TO HAIR AS NEEDED FOR FLAKY/ITCHY SCALP (TYPICALLY EVERY 2-3 DAYS)    . levothyroxine (SYNTHROID, LEVOTHROID) 125 MCG tablet TAKE ONE TABLET BY MOUTH EVERY DAY BEFORE BREAKFAST 30 tablet 0  . lidocaine (LIDODERM) 5 % Place 1 patch onto the skin every 12 (twelve) hours. Remove & Discard patch within 12 hours or as directed by MD 60 patch 11  . LORazepam (ATIVAN) 0.5 MG tablet Take 1 tablet (0.5 mg total) by mouth every 8 (eight)  hours as needed for anxiety (nausea). 30 tablet 0  . morphine (MS CONTIN) 30 MG 12 hr tablet Take 2 tablets (60 mg total) by mouth every 12 (twelve) hours. 120 tablet 0  . Multiple Vitamin (MULTIVITAMIN WITH MINERALS) TABS tablet Take 1 tablet by mouth daily after breakfast. CENTRUM SILVER    . nystatin (MYCOSTATIN) 100000 UNIT/ML suspension   2  . PEPPERMINT OIL PO Take 1 capsule by mouth 3 (three) times daily as needed (for IBS). IBgard (active ingredient: 90 mg ultrapurified peppermint oil)    . polyethylene glycol powder (GLYCOLAX/MIRALAX) powder Take 17 g by mouth daily after breakfast. Mix with glass of water 255 g 1  . predniSONE (DELTASONE) 10 MG tablet TAKE ONE TABLET BY MOUTH EVERY DAY WITH BREAKFAST 30 tablet 0  . promethazine (PHENERGAN) 25 MG suppository Place 1 suppository (25 mg total) rectally every 6 (six) hours as needed for nausea. 30 suppository 0  . promethazine (PHENERGAN) 25 MG tablet Take 1 tablet (25 mg total) by mouth every 6 (six) hours as needed for nausea or vomiting. 30 tablet 0  . RABEprazole (ACIPHEX) 20 MG tablet Take 20 mg by mouth daily before breakfast.     . ramipril (ALTACE) 10 MG capsule Take 10 mg by mouth daily after breakfast.    . SENNA  LAX 8.6 MG tablet TAKE TWO TABLETS BY MOUTH DAILY 120 tablet 3  . sertraline (ZOLOFT) 100 MG tablet Take 100 mg by mouth daily after breakfast.    . sodium phosphate (FLEET) 7-19 GM/118ML ENEM Place 133 mLs (1 enema total) rectally daily as needed for severe constipation. 5 enema 0  . tamsulosin (FLOMAX) 0.4 MG CAPS capsule Take 0.8 mg by mouth daily after supper.     . morphine (MSIR) 15 MG tablet Take 1 tablet (15 mg total) by mouth every 6 (six) hours as needed for moderate pain or severe pain. 120 tablet 0  . nitrofurantoin, macrocrystal-monohydrate, (MACROBID) 100 MG capsule Take 1 capsule (100 mg total) by mouth 2 (two) times daily. 14 capsule 0   No current facility-administered medications for this visit.    Facility-Administered Medications Ordered in Other Visits  Medication Dose Route Frequency Provider Last Rate Last Dose  . heparin lock flush 100 unit/mL  500 Units Intravenous Once Earlie Server, MD      . sodium chloride flush (NS) 0.9 % injection 10 mL  10 mL Intravenous PRN Earlie Server, MD   10 mL at 07/03/17 0930    PHYSICAL EXAMINATION:  ECOG PERFORMANCE STATUS: 2 - Symptomatic, <50% confined to bed  Vitals:   08/31/17 1403  BP: (!) 124/57  Pulse: 60  Temp: (!) 95.3 F (35.2 C)    Filed Weights   08/31/17 1403  Weight: 136 lb (61.7 kg)     Physical Exam  Constitutional: He is oriented to person, place, and time. No distress.  HENT:  Head: Normocephalic and atraumatic.  Eyes: Conjunctivae and EOM are normal. Pupils are equal, round, and reactive to light. Left eye exhibits no discharge.  Neck: Normal range of motion. Neck supple. No JVD present. No tracheal deviation present.  Cardiovascular: Normal rate and regular rhythm.  No murmur heard. Pulmonary/Chest: No stridor. No respiratory distress. He has no wheezes. He has no rales.  Abdominal: He exhibits no distension. There is no tenderness.  Genitourinary:  Genitourinary Comments: Foley catheter.    Musculoskeletal: Normal range of motion. He exhibits no edema.  Palpable spinal tenderness.  Lymphadenopathy:  He has no cervical adenopathy.  Neurological: He is alert and oriented to person, place, and time. No cranial nerve deficit.  Skin: Skin is warm. No rash noted. No erythema.  Psychiatric: Affect normal.    ECOG 2  LABORATORY DATA: I have personally reviewed the data as listed: CBC    Component Value Date/Time   WBC 8.4 08/31/2017 1339   RBC 3.37 (L) 08/31/2017 1339   HGB 9.8 (L) 08/31/2017 1339   HGB 15.3 11/15/2012 1641   HCT 29.5 (L) 08/31/2017 1339   HCT 43.7 11/15/2012 1641   PLT 258 08/31/2017 1339   PLT 166 11/15/2012 1641   MCV 87.6 08/31/2017 1339   MCV 89 11/15/2012 1641   MCH 28.9 08/31/2017 1339   MCHC 33.0 08/31/2017 1339   RDW 20.2 (H) 08/31/2017 1339   RDW 13.3 11/15/2012 1641   LYMPHSABS 1.1 08/31/2017 1339   LYMPHSABS 1.7 11/15/2012 1641   MONOABS 0.7 08/31/2017 1339   MONOABS 0.8 11/15/2012 1641   EOSABS 0.3 08/31/2017 1339   EOSABS 0.1 11/15/2012 1641   BASOSABS 0.0 08/31/2017 1339   BASOSABS 0.0 11/15/2012 1641   CMP Latest Ref Rng & Units 08/31/2017 08/20/2017 08/13/2017  Glucose 65 - 99 mg/dL 108(H) 139(H) 141(H)  BUN 6 - 20 mg/dL 15 23(H) 19  Creatinine 0.61 - 1.24 mg/dL 0.88 1.10 0.95  Sodium 135 - 145 mmol/L 130(L) 133(L) 135  Potassium 3.5 - 5.1 mmol/L 4.3 4.5 4.8  Chloride 101 - 111 mmol/L 95(L) 98(L) 99(L)  CO2 22 - 32 mmol/L 23 24 25   Calcium 8.9 - 10.3 mg/dL 8.3(L) 9.0 8.6(L)  Total Protein 6.5 - 8.1 g/dL 7.1 7.2 7.0  Total Bilirubin 0.3 - 1.2 mg/dL 0.6 0.6 0.9  Alkaline Phos 38 - 126 U/L 147(H) 124 111  AST 15 - 41 U/L 55(H) 106(H) 61(H)  ALT 17 - 63 U/L 155(H) 243(H) 101(H)     RADIOGRAPHIC STUDIES: I have personally reviewed the radiological images as listed and agree with the findings in the report 5.  Aortic Atherosclerosis (ICD10-I70.0).  Coronary atherosclerosis. 6. Inflammatory stranding from the right  perirenal space extends down into the pelvis and crosses the midline. There is presacral edema. 7. 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 Recommend followup by ultrasound in 3 years. This recommendation follows ACR consensus guidelines: White Paper of the ACR Incidental Findings Committee II on Vascular Findings. J Am Coll Radiol 2013; 00:349-179 8. Wall thickening along the left side of the urinary bladder may be incidental, but synchronous sessile transitional cell carcinoma is not readily excluded.  CT abdomen wo contrast 02/09/2017  Increased size of 4.6 cm ill-defined hypoenhancing mass ininterpolar region of right kidney, with obstruction of upper pole collecting system. This does not have typical appearance for pyelonephritis or renal cell carcinoma, and raises suspicion for urothelial carcinoma. Consider ureteroscopy for further evaluation. No evidence of metastatic disease. Stable mildly enlarged prostate and mild diffuse bladder wall thickening. Stable 3.0 cm infrarenal abdominal aortic aneurysm. Recommend followup by ultrasound in 3 years. This recommendation follows ACR consensus guidelines: White Paper of the ACR Incidental Findings Committee II on Vascular Findings. [place] [phone]. Colonic diverticulosis. No radiographic evidence of diverticulitis. Stable small fat-containing left inguinal hernia. MRI thoracic and lumbar w/wo contrast.  1. Malignant invasion of the L2 vertebral body by the adjacent retroperitoneal mass. No pathologic fracture or osseous metastatic disease elsewhere in the thoracolumbar spine. 2. Areas of epidural contrast enhancement of the lumbar spine are  most consistent with a dilated epidural venous plexus secondary to invasion of the inferior vena cava by the patient's urothelial tumor. 3. Moderate L4-L5 multifactorial spinal canal stenosis with severe right and mild-to-moderate left neural foraminal  stenosis. 4. Moderate right and severe left multifactorial L5-S1 neural foraminal stenosis. There is also severe attenuation of the thecal sac at this level but no bony spinal canal stenosis. 5. No spinal canal or neural foraminal stenosis in the thoracic spine.   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 Dysuria   2. 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 Encounter for antineoplastic immunotherapy   4. Transitional cell carcinoma of kidney, right ([place])   5. Drug-induced hepatotoxicity    # Continue to hold today's immunotherapy.  # Grade 2 transaminitis, likely hepatoxicity from immunotherapy. Hold today's treatment. Levels are trending down to grade 1, taper prednisone to 69m daily.  Advise patient to stop taking Norco as contains tylenol. Prescribe Morphin IR 179mQ6h PRN for pain in combination with MS contin 6050mID.   # Neoplasm related bone pain: continue current pain regimen.  Xgeva Q28 days due on 12/10. Due to the inclining weather this weekend and on 12/10, patient and wife prefer to get it today. OK to proceed Xgeva today.  # Dysuria: check UA, urine culture, likely UTI. Send him a prescription of Nitrofurantoin 100m44mD x 7 days. Will call if he does not have UTI.  His indwelling foley was exchanged today in the office by NP. Provide him Rx of leg bags x 20.   #  Constipation: continue current bowel regimen   #Hypothyroidism on Synthroid 125mc29mily. TSH trending down. Continue current dosage.   Follow up on 1 week with labs, evaluation and possible treatment.   Kartel Wolbert Earlie Server 08/31/2017 5:23 PM

## 2017-09-03 ENCOUNTER — Inpatient Hospital Stay: Payer: Medicare Other

## 2017-09-05 LAB — URINE CULTURE: Culture: 50000 — AB

## 2017-09-06 NOTE — Progress Notes (Deleted)
[facility] [facility] Follow Up visit  Patient Care Team: [name], MD as PCP - General (Physician Assistant)  REASON FOR VISIT Treatment of Locally advanced kidney urothelial cancer  ONCOLOGY HISTORY Joseph Ritter 76 y.o. male with PMH listed as below  is referred to [place] for evaluation of locally advanced kidney urothelia cancer. Patient developed gross hematuria at the end of 2017. CT 08/2016 showed endophytic hypodense mass. Repeat image 01/2017 showed enlarged  endophytic hypodense mass right kidney. 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 Right robotic nephroureterectomy was attempted but aborted due to unresectable newly metastatic disease diagnosed intraoperatively. Postop he underwent a CT scan of the abdomen and pelvis which showed extension of an infiltrative mass from the right kidney along the right renal vein toward the IVC with encasement of the IVC.  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 He self catheterize a few time a day.  He reports having back pain which limits her ability to walk. Also constipated and abdominal bloating. His appetite is fair. He takes Norco for abdominal and back pain.  He can walk with walker for very short distance, mostly limited by back pain and fatigue./weakness.  # Images: CT chest and abdomen 05/03/2017 1. Pneumoperitoneum with a small amount of ascites, gas tracking along the thoracoabdominal wall into the scrotum, and a small amount of gas in the right perirenal space-these findings are likely related to the patient's attempted nephroureterectomy yesterday. Surveillance to ensure expected resolution of the pneumoperitoneum might be appropriate. 2. The right kidney lower pole infarct with lack of patency of the more inferior of the 3 right renal arteries. There is delayed nephrogram on the right with increase an infiltrative process in theright  kidney and extending into the right renal hilar vascular structures, including the right renal vein and the adjacent IVC, compatible with tumor thrombus. There is also soft tissue density surrounding the renal hilar vascular structures and extending into the retroperitoneum and periaortic region, some of which may be tumor and some of which may be hematoma.3. Demineralization of the anterior inferior L2 vertebral body,concerning for tumor invasion. This is near the level of the retroaortic left renal vein. 4. No findings of metastatic disease to the chest. There are new trace bilateral pleural effusions with mild passive atelectasis.  #Pathology: 05/02/2017 SPECIMEN SUBMITTED: A. Hilar tissue, right kidney B. Hilar tissue, right kidney DIAGNOSIS:  A and B. SOFT TISSUE, RIGHT RENAL HILUM  PD-L1 - Urothelial (TECENTRIQ) Immunohistochemistry Analysis  Result: 0% Interpretation: No expression   # Current Treatment: 1 Palliative RT to spine 2 Chemotherapy:     06/01/2017 Cycle 1 Gemcitabine and Carboplain.  Day 8 Gemcitabine was delayed due to intractable nausea and vomiting.     Chemotherapy related thrombocytopenia, no active bleeding. 50% dose of Gemcitabine was given on day 11.     06/25/2017 Cycle 2 Day 1 Gemcitabine and Carboplain.    07/03/2017 Cycle 2 Day 8 chemotherapy discontinued.      3 Immunotherapy:     07/27/2017 cycle 1 of Atezolizumab, 11/26 2018 cycle 2 Atezolizumab was held due to hepatic toxicity.        INTERVAL HISTORY 76 yo male who has above oncology history reviewed by me today presents for evaluation for immunotherapy. Reports feeling bladder discomfort. He has chronic indwelling foley catheter. Denies fever or chills. Feels weak.    Review of Systems  Constitutional: Positive for fatigue. Negative  for chills and fever.  HENT:   Negative for lump/mass.   Eyes: Negative for eye problems.  Respiratory: Negative for chest tightness and shortness of breath.    Cardiovascular: Negative for chest pain, leg swelling and palpitations.  Gastrointestinal: Positive for nausea. Negative for abdominal pain, blood in stool and vomiting.  Endocrine: Negative for hot flashes.  Genitourinary: Positive for difficulty urinating and dysuria. Negative for hematuria.   Musculoskeletal: Positive for back pain. Negative for flank pain.  Skin: Negative for itching.  Neurological: Negative for dizziness.  Hematological: Negative for adenopathy.  Psychiatric/Behavioral: The patient is not nervous/anxious.      MEDICAL HISTORY: Past Medical History:  Diagnosis Date  . Acid indigestion 03/09/2014  . Adult hypothyroidism 11/18/2012  . Anxiety   . Arteriosclerosis of coronary artery 11/18/2012   Overview:  PATENT LIMA TO LAD, PATENT SVG TO PDA AND OCCULUDED SVG TO OM2 BY CATHERIZATION 02/09/2009   . 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 Borderline diabetes 05/25/2014  . BP (high blood pressure) 11/18/2012  . BPH (benign prostatic hypertrophy)   . CAFL (chronic airflow limitation) ([place]) 05/03/2015  . Cancer ([place])    renal cancer with mets  . CCF (congestive cardiac failure) ([place]) 01/03/2014   Overview:  HX OF   . COPD (chronic obstructive pulmonary disease) ([place])   . DDD (degenerative disc disease), lumbar 06/29/2014  . 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 Gastric ulcer 03/09/2014  . 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 History of urinary self-catheterization 2017  . HLD (hyperlipidemia) 01/03/2014  . Incomplete bladder emptying 11/18/2012  . Leg weakness 11/18/2012  . Lumbar radiculitis   . Neuritis or radiculitis due to rupture of lumbar intervertebral disc 06/29/2014  . Overactive bladder   . PONV (postoperative nausea and vomiting)    years ago with Ether, no problem with Nausea or vomiting with the last few surgerys  .  Pre-diabetes   . Subacute transverse myelitis ([place]) 11/21/2012  . Teeth problem    pt reports "bad teeth", "need to be pulled"    SURGICAL HISTORY: Past Surgical History:  Procedure Laterality Date  . ARTERY BIOPSY Right 12/16/2015   Procedure: BIOPSY TEMPORAL ARTERY;  Surgeon: Algernon Huxley, MD;  Location: [facility];  Service: Vascular;  Laterality: Right;  . CARDIAC CATHETERIZATION    . CATARACT EXTRACTION W/PHACO Left 01/26/2016   Procedure: CATARACT EXTRACTION PHACO AND INTRAOCULAR LENS PLACEMENT (IOC) LEFT EYE;  Surgeon: Leandrew Koyanagi, MD;  Location: [facility];  Service: Ophthalmology;  Laterality: Left;  . CORONARY ARTERY BYPASS GRAFT  04/07/2002   [facility]  . Cysto Bladder Botox Injection  07/02/2014  . CYSTOSCOPY W/ RETROGRADES Right 03/16/2017   Procedure: CYSTOSCOPY WITH RETROGRADE PYELOGRAM;  Surgeon: [name], MD;  Location: [facility];  Service: Urology;  Laterality: Right;  . CYSTOSCOPY WITH STENT PLACEMENT Right 03/16/2017   Procedure: CYSTOSCOPY WITH STENT PLACEMENT;  Surgeon: [name], MD;  Location: [facility];  Service: Urology;  Laterality: Right;  . EYE SURGERY Left    blood clot behind left eye  . HAND SURGERY Bilateral   . HERNIA REPAIR Right    inguinal hernia repair  . LEFT HEART CATH AND CORS/GRAFTS ANGIOGRAPHY N/A 04/25/2017   Procedure: Left Heart Cath and Cors/Grafts Angiography;  Surgeon: Isaias Cowman, MD;  Location: [facility];  Service: Cardiovascular;  Laterality: N/A;  . PORTA CATH INSERTION N/A 05/30/2017   Procedure: Glori Luis Cath Insertion;  Surgeon: Katha Cabal, MD;  Location: [facility];  Service: Cardiovascular;  Laterality: N/A;  . ROBOT ASSITED LAPAROSCOPIC NEPHROURETERECTOMY Right 05/02/2017   Procedure: ROBOT ASSITED LAPAROSCOPIC NEPHROURETERECTOMY ATTEMPTED;  Surgeon: [name], MD;  Location: [facility];  Service: Urology;  Laterality: Right;  . URETEROSCOPY Right 03/16/2017   Procedure:  URETEROSCOPY BIOPSY RENAL MASS;  Surgeon: [name], MD;  Location: [facility];  Service: Urology;  Laterality: Right;    SOCIAL HISTORY: Social History   Socioeconomic History  . Marital status: Married    Spouse name: Not on file  . Number of children: Not on file  . Years of education: Not on file  . Highest education level: Not on file  Social Needs  . Financial resource strain: Not on file  . Food insecurity - worry: Not on file  . Food insecurity - inability: Not on file  . Transportation needs - medical: Not on file  . Transportation needs - non-medical: Not on file  Occupational History  . Not on file  Tobacco Use  . Smoking status: Former Smoker    Types: Cigarettes    Last attempt to quit: 04/07/2002    Years since quitting: 15.4  . Smokeless tobacco: Former Systems developer    Types: Snuff, Chew  Substance and Sexual Activity  . Alcohol use: No    Alcohol/week: 0.0 oz  . Drug use: No  . Sexual activity: Not on file  Other Topics Concern  . Not on file  Social History Narrative  . Not on file    FAMILY HISTORY Family History  Problem Relation Age of Onset  . Heart attack Mother   . Heart disease Father   . Prostate cancer Neg Hx   . Bladder Cancer Neg Hx   . Kidney cancer Neg Hx     ALLERGIES:  has No Known Allergies.  MEDICATIONS:  Current Outpatient Medications  Medication Sig Dispense Refill  . apixaban (ELIQUIS) 5 MG TABS tablet Take 1 tablet (5 mg total) by mouth 2 (two) times daily. 60 tablet 0  . buPROPion (WELLBUTRIN SR) 100 MG 12 hr tablet TAKE ONE TABLET BY MOUTH EVERY DAY    . cyclobenzaprine (FLEXERIL) 10 MG tablet Take 10 mg by mouth at bedtime as needed for muscle spasms.    Marland Kitchen ezetimibe-simvastatin (VYTORIN) 10-40 MG tablet Take 1 tablet by mouth at bedtime.    . feeding supplement, ENSURE ENLIVE, (ENSURE ENLIVE) LIQD Take 237 mLs by mouth 2 (two) times daily between meals. 60 Bottle 0  . HYDROcodone-acetaminophen (NORCO) 7.5-325 MG tablet  Take 1 tablet by mouth every 4 (four) hours as needed for moderate pain. 180 tablet 0  . 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    Marland Kitchen ketoconazole (NIZORAL) 2 % shampoo APPLY SHAMPOO TO HAIR AS NEEDED FOR FLAKY/ITCHY SCALP (TYPICALLY EVERY 2-3 DAYS)    . levothyroxine (SYNTHROID, LEVOTHROID) 125 MCG tablet TAKE ONE TABLET BY MOUTH EVERY DAY BEFORE BREAKFAST 30 tablet 0  . lidocaine (LIDODERM) 5 % Place 1 patch onto the skin every 12 (twelve) hours. Remove & Discard patch within 12 hours or as directed by MD 60 patch 11  . LORazepam (ATIVAN) 0.5 MG tablet Take 1 tablet (0.5 mg total) by mouth every 8 (eight)  hours as needed for anxiety (nausea). 30 tablet 0  . morphine (MS CONTIN) 30 MG 12 hr tablet Take 2 tablets (60 mg total) by mouth every 12 (twelve) hours. 120 tablet 0  . morphine (MSIR) 15 MG tablet Take 1 tablet (15 mg total) by mouth every 6 (six) hours as needed for moderate pain or severe pain. 120 tablet 0  . Multiple Vitamin (MULTIVITAMIN WITH MINERALS) TABS tablet Take 1 tablet by mouth daily after breakfast. CENTRUM SILVER    . nitrofurantoin, macrocrystal-monohydrate, (MACROBID) 100 MG capsule Take 1 capsule (100 mg total) by mouth 2 (two) times daily. 14 capsule 0  . nystatin (MYCOSTATIN) 100000 UNIT/ML suspension   2  . PEPPERMINT OIL PO Take 1 capsule by mouth 3 (three) times daily as needed (for IBS). IBgard (active ingredient: 90 mg ultrapurified peppermint oil)    . polyethylene glycol powder (GLYCOLAX/MIRALAX) powder Take 17 g by mouth daily after breakfast. Mix with glass of water 255 g 1  . predniSONE (DELTASONE) 10 MG tablet TAKE ONE TABLET BY MOUTH EVERY DAY WITH BREAKFAST 30 tablet 0  . promethazine (PHENERGAN) 25 MG suppository Place 1 suppository (25 mg total) rectally every 6 (six) hours as needed for nausea. 30 suppository 0  . promethazine (PHENERGAN) 25  MG tablet Take 1 tablet (25 mg total) by mouth every 6 (six) hours as needed for nausea or vomiting. 30 tablet 0  . RABEprazole (ACIPHEX) 20 MG tablet Take 20 mg by mouth daily before breakfast.     . ramipril (ALTACE) 10 MG capsule Take 10 mg by mouth daily after breakfast.    . SENNA LAX 8.6 MG tablet TAKE TWO TABLETS BY MOUTH DAILY 120 tablet 3  . sertraline (ZOLOFT) 100 MG tablet Take 100 mg by mouth daily after breakfast.    . sodium phosphate (FLEET) 7-19 GM/118ML ENEM Place 133 mLs (1 enema total) rectally daily as needed for severe constipation. 5 enema 0  . tamsulosin (FLOMAX) 0.4 MG CAPS capsule Take 0.8 mg by mouth daily after supper.      No current facility-administered medications for this visit.    Facility-Administered Medications Ordered in Other Visits  Medication Dose Route Frequency Provider Last Rate Last Dose  . heparin lock flush 100 unit/mL  500 Units Intravenous Once Earlie Server, MD      . sodium chloride flush (NS) 0.9 % injection 10 mL  10 mL Intravenous PRN Earlie Server, MD   10 mL at 07/03/17 0930    PHYSICAL EXAMINATION:  ECOG PERFORMANCE STATUS: 2 - Symptomatic, <50% confined to bed  There were no vitals filed for this visit.  There were no vitals filed for this visit.   Physical Exam  Constitutional: He is oriented to person, place, and time. No distress.  HENT:  Head: Normocephalic and atraumatic.  Eyes: Conjunctivae and EOM are normal. Pupils are equal, round, and reactive to light. Left eye exhibits no discharge.  Neck: Normal range of motion. Neck supple. No JVD present. No tracheal deviation present.  Cardiovascular: Normal rate and regular rhythm.  No murmur heard. Pulmonary/Chest: No stridor. No respiratory distress. He has no wheezes. He has no rales.  Abdominal: He exhibits no distension. There is no tenderness.  Genitourinary:  Genitourinary Comments: Foley catheter.   Musculoskeletal: Normal range of motion. He exhibits no edema.  Palpable  spinal tenderness.  Lymphadenopathy:    He has no cervical adenopathy.  Neurological: He is alert and oriented to person, place, and time. No cranial  nerve deficit.  Skin: Skin is warm. No rash noted. No erythema.  Psychiatric: Affect normal.    ECOG 2  LABORATORY DATA: I have personally reviewed the data as listed: CBC    Component Value Date/Time   WBC 8.4 08/31/2017 1339   RBC 3.37 (L) 08/31/2017 1339   HGB 9.8 (L) 08/31/2017 1339   HGB 15.3 11/15/2012 1641   HCT 29.5 (L) 08/31/2017 1339   HCT 43.7 11/15/2012 1641   PLT 258 08/31/2017 1339   PLT 166 11/15/2012 1641   MCV 87.6 08/31/2017 1339   MCV 89 11/15/2012 1641   MCH 28.9 08/31/2017 1339   MCHC 33.0 08/31/2017 1339   RDW 20.2 (H) 08/31/2017 1339   RDW 13.3 11/15/2012 1641   LYMPHSABS 1.1 08/31/2017 1339   LYMPHSABS 1.7 11/15/2012 1641   MONOABS 0.7 08/31/2017 1339   MONOABS 0.8 11/15/2012 1641   EOSABS 0.3 08/31/2017 1339   EOSABS 0.1 11/15/2012 1641   BASOSABS 0.0 08/31/2017 1339   BASOSABS 0.0 11/15/2012 1641   CMP Latest Ref Rng & Units 08/31/2017 08/20/2017 08/13/2017  Glucose 65 - 99 mg/dL 108(H) 139(H) 141(H)  BUN 6 - 20 mg/dL 15 23(H) 19  Creatinine 0.61 - 1.24 mg/dL 0.88 1.10 0.95  Sodium 135 - 145 mmol/L 130(L) 133(L) 135  Potassium 3.5 - 5.1 mmol/L 4.3 4.5 4.8  Chloride 101 - 111 mmol/L 95(L) 98(L) 99(L)  CO2 22 - 32 mmol/L 23 24 25   Calcium 8.9 - 10.3 mg/dL 8.3(L) 9.0 8.6(L)  Total Protein 6.5 - 8.1 g/dL 7.1 7.2 7.0  Total Bilirubin 0.3 - 1.2 mg/dL 0.6 0.6 0.9  Alkaline Phos 38 - 126 U/L 147(H) 124 111  AST 15 - 41 U/L 55(H) 106(H) 61(H)  ALT 17 - 63 U/L 155(H) 243(H) 101(H)     RADIOGRAPHIC STUDIES: I have personally reviewed the radiological images as listed and agree with the findings in the report 5.  Aortic Atherosclerosis (ICD10-I70.0).  Coronary atherosclerosis. 6. Inflammatory stranding from the right perirenal space extends down into the pelvis and crosses the midline. There is  presacral edema. 7. 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 Recommend followup by ultrasound in 3 years. This recommendation follows ACR consensus guidelines: White Paper of the ACR Incidental Findings Committee II on Vascular Findings. J Am Coll Radiol 2013; 57:017-793 8. Wall thickening along the left side of the urinary bladder may be incidental, but synchronous sessile transitional cell carcinoma is not readily excluded.  CT abdomen wo contrast 02/09/2017  Increased size of 4.6 cm ill-defined hypoenhancing mass ininterpolar region of right kidney, with obstruction of upper pole collecting system. This does not have typical appearance for pyelonephritis or renal cell carcinoma, and raises suspicion for urothelial carcinoma. Consider ureteroscopy for further evaluation. No evidence of metastatic disease. Stable mildly enlarged prostate and mild diffuse bladder wall thickening. Stable 3.0 cm infrarenal abdominal aortic aneurysm. Recommend followup by ultrasound in 3 years. This recommendation follows ACR consensus guidelines: White Paper of the ACR Incidental Findings Committee II on Vascular Findings. [place] [id number]. Colonic diverticulosis. No radiographic evidence of diverticulitis. Stable small fat-containing left inguinal hernia. MRI thoracic and lumbar w/wo contrast.  1. Malignant invasion of the L2 vertebral body by the adjacent retroperitoneal mass. No pathologic fracture or osseous metastatic disease elsewhere in the thoracolumbar spine. 2. Areas of epidural contrast enhancement of the lumbar spine are most consistent with a dilated epidural venous plexus secondary to invasion of the inferior vena cava by the patient's  urothelial tumor. 3. Moderate L4-L5 multifactorial spinal canal stenosis with severe right and mild-to-moderate left neural foraminal stenosis. 4. Moderate right and severe left multifactorial L5-S1 neural foraminal  stenosis. There is also severe attenuation of the thecal sac at this level but no bony spinal canal stenosis. 5. No spinal canal or neural foraminal stenosis in the thoracic spine.   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 # Continue to hold today's immunotherapy.  # Grade 2 transaminitis, likely hepatoxicity from immunotherapy. Hold today's treatment. Levels are trending down to grade 1, taper prednisone to 36m daily.  Advise patient to stop taking Norco as contains tylenol. Prescribe Morphin IR [id number] PRN for pain in combination with MS contin 6052mID.   # Neoplasm related bone pain: continue current pain regimen.  Xgeva Q28 days due on 12/10. Due to the inclining weather this weekend and on 12/10, patient and wife prefer to get it today. OK to proceed Xgeva today.  # Dysuria: check UA, urine culture, likely UTI. Send him a prescription of Nitrofurantoin 100m71mD x 7 days. Will call if he does not have UTI.  His indwelling foley was exchanged today in the office by NP. Provide him Rx of leg bags x 20.   #  Constipation: continue current bowel regimen   #Hypothyroidism on Synthroid 125mc52mily. TSH trending down. Continue current dosage.   Follow up on 1 week with labs, evaluation and possible treatment.   [place] Earlie Server 09/06/2017 11:38 PM

## 2017-09-07 ENCOUNTER — Inpatient Hospital Stay: Payer: Medicare Other | Admitting: Oncology

## 2017-09-07 ENCOUNTER — Inpatient Hospital Stay: Payer: Medicare Other

## 2017-09-08 ENCOUNTER — Other Ambulatory Visit: Payer: Self-pay | Admitting: Oncology

## 2017-09-10 ENCOUNTER — Encounter: Payer: Self-pay | Admitting: Oncology

## 2017-09-10 ENCOUNTER — Inpatient Hospital Stay: Payer: Medicare Other

## 2017-09-10 ENCOUNTER — Inpatient Hospital Stay (HOSPITAL_BASED_OUTPATIENT_CLINIC_OR_DEPARTMENT_OTHER): Payer: Medicare Other | Admitting: Oncology

## 2017-09-10 ENCOUNTER — Ambulatory Visit: Payer: Medicare Other

## 2017-09-10 ENCOUNTER — Encounter: Payer: Self-pay | Admitting: Intensive Care

## 2017-09-10 ENCOUNTER — Other Ambulatory Visit: Payer: Self-pay

## 2017-09-10 ENCOUNTER — Emergency Department: Payer: Medicare Other

## 2017-09-10 ENCOUNTER — Inpatient Hospital Stay
Admission: EM | Admit: 2017-09-10 | Discharge: 2017-09-12 | DRG: 698 | Disposition: A | Discharge status: Home Health | Payer: Medicare Other | Attending: Specialist | Admitting: Specialist

## 2017-09-10 VITALS — BP 72/41 | HR 121 | Temp 95.8°F

## 2017-09-10 VITALS — BP 79/47

## 2017-09-10 DIAGNOSIS — N390 Urinary tract infection, site not specified: Secondary | ICD-10-CM | POA: Diagnosis present

## 2017-09-10 DIAGNOSIS — Z7952 Long term (current) use of systemic steroids: Secondary | ICD-10-CM

## 2017-09-10 DIAGNOSIS — C7951 Secondary malignant neoplasm of bone: Secondary | ICD-10-CM | POA: Diagnosis present

## 2017-09-10 DIAGNOSIS — I130 Hypertensive heart and chronic kidney disease with heart failure and stage 1 through stage 4 chronic kidney disease, or unspecified chronic kidney disease: Secondary | ICD-10-CM | POA: Diagnosis present

## 2017-09-10 DIAGNOSIS — N400 Enlarged prostate without lower urinary tract symptoms: Secondary | ICD-10-CM

## 2017-09-10 DIAGNOSIS — R112 Nausea with vomiting, unspecified: Secondary | ICD-10-CM | POA: Diagnosis not present

## 2017-09-10 DIAGNOSIS — Z66 Do not resuscitate: Secondary | ICD-10-CM | POA: Diagnosis present

## 2017-09-10 DIAGNOSIS — R42 Dizziness and giddiness: Secondary | ICD-10-CM

## 2017-09-10 DIAGNOSIS — R945 Abnormal results of liver function studies: Secondary | ICD-10-CM | POA: Diagnosis present

## 2017-09-10 DIAGNOSIS — I509 Heart failure, unspecified: Secondary | ICD-10-CM

## 2017-09-10 DIAGNOSIS — R531 Weakness: Secondary | ICD-10-CM

## 2017-09-10 DIAGNOSIS — G893 Neoplasm related pain (acute) (chronic): Secondary | ICD-10-CM | POA: Diagnosis present

## 2017-09-10 DIAGNOSIS — I959 Hypotension, unspecified: Secondary | ICD-10-CM

## 2017-09-10 DIAGNOSIS — F419 Anxiety disorder, unspecified: Secondary | ICD-10-CM | POA: Diagnosis present

## 2017-09-10 DIAGNOSIS — T451X5S Adverse effect of antineoplastic and immunosuppressive drugs, sequela: Secondary | ICD-10-CM

## 2017-09-10 DIAGNOSIS — C641 Malignant neoplasm of right kidney, except renal pelvis: Secondary | ICD-10-CM | POA: Diagnosis present

## 2017-09-10 DIAGNOSIS — Y846 Urinary catheterization as the cause of abnormal reaction of the patient, or of later complication, without mention of misadventure at the time of the procedure: Secondary | ICD-10-CM | POA: Diagnosis present

## 2017-09-10 DIAGNOSIS — Z87891 Personal history of nicotine dependence: Secondary | ICD-10-CM

## 2017-09-10 DIAGNOSIS — Z79899 Other long term (current) drug therapy: Secondary | ICD-10-CM

## 2017-09-10 DIAGNOSIS — I714 Abdominal aortic aneurysm, without rupture: Secondary | ICD-10-CM

## 2017-09-10 DIAGNOSIS — M549 Dorsalgia, unspecified: Secondary | ICD-10-CM

## 2017-09-10 DIAGNOSIS — Z905 Acquired absence of kidney: Secondary | ICD-10-CM

## 2017-09-10 DIAGNOSIS — T83511A Infection and inflammatory reaction due to indwelling urethral catheter, initial encounter: Secondary | ICD-10-CM | POA: Diagnosis not present

## 2017-09-10 DIAGNOSIS — Z5112 Encounter for antineoplastic immunotherapy: Secondary | ICD-10-CM

## 2017-09-10 DIAGNOSIS — C679 Malignant neoplasm of bladder, unspecified: Secondary | ICD-10-CM | POA: Diagnosis present

## 2017-09-10 DIAGNOSIS — E039 Hypothyroidism, unspecified: Secondary | ICD-10-CM

## 2017-09-10 DIAGNOSIS — R7303 Prediabetes: Secondary | ICD-10-CM

## 2017-09-10 DIAGNOSIS — I110 Hypertensive heart disease with heart failure: Secondary | ICD-10-CM

## 2017-09-10 DIAGNOSIS — A419 Sepsis, unspecified organism: Secondary | ICD-10-CM | POA: Diagnosis present

## 2017-09-10 DIAGNOSIS — N319 Neuromuscular dysfunction of bladder, unspecified: Secondary | ICD-10-CM

## 2017-09-10 DIAGNOSIS — J449 Chronic obstructive pulmonary disease, unspecified: Secondary | ICD-10-CM | POA: Diagnosis present

## 2017-09-10 DIAGNOSIS — R740 Nonspecific elevation of levels of transaminase and lactic acid dehydrogenase [LDH]: Secondary | ICD-10-CM

## 2017-09-10 DIAGNOSIS — E785 Hyperlipidemia, unspecified: Secondary | ICD-10-CM

## 2017-09-10 DIAGNOSIS — K219 Gastro-esophageal reflux disease without esophagitis: Secondary | ICD-10-CM | POA: Diagnosis present

## 2017-09-10 DIAGNOSIS — R7401 Elevation of levels of liver transaminase levels: Secondary | ICD-10-CM

## 2017-09-10 DIAGNOSIS — N183 Chronic kidney disease, stage 3 unspecified: Secondary | ICD-10-CM | POA: Diagnosis present

## 2017-09-10 DIAGNOSIS — Z9225 Personal history of immunosupression therapy: Secondary | ICD-10-CM | POA: Diagnosis not present

## 2017-09-10 DIAGNOSIS — E440 Moderate protein-calorie malnutrition: Secondary | ICD-10-CM | POA: Diagnosis present

## 2017-09-10 DIAGNOSIS — E860 Dehydration: Secondary | ICD-10-CM

## 2017-09-10 DIAGNOSIS — Z8711 Personal history of peptic ulcer disease: Secondary | ICD-10-CM

## 2017-09-10 DIAGNOSIS — Z7901 Long term (current) use of anticoagulants: Secondary | ICD-10-CM

## 2017-09-10 DIAGNOSIS — Z6822 Body mass index (BMI) 22.0-22.9, adult: Secondary | ICD-10-CM

## 2017-09-10 DIAGNOSIS — Z23 Encounter for immunization: Secondary | ICD-10-CM | POA: Diagnosis present

## 2017-09-10 DIAGNOSIS — T50905A Adverse effect of unspecified drugs, medicaments and biological substances, initial encounter: Secondary | ICD-10-CM

## 2017-09-10 DIAGNOSIS — Z87440 Personal history of urinary (tract) infections: Secondary | ICD-10-CM

## 2017-09-10 DIAGNOSIS — I5032 Chronic diastolic (congestive) heart failure: Secondary | ICD-10-CM | POA: Diagnosis present

## 2017-09-10 DIAGNOSIS — Z951 Presence of aortocoronary bypass graft: Secondary | ICD-10-CM

## 2017-09-10 DIAGNOSIS — T451X5A Adverse effect of antineoplastic and immunosuppressive drugs, initial encounter: Secondary | ICD-10-CM | POA: Diagnosis present

## 2017-09-10 DIAGNOSIS — D6959 Other secondary thrombocytopenia: Secondary | ICD-10-CM | POA: Diagnosis not present

## 2017-09-10 DIAGNOSIS — R7989 Other specified abnormal findings of blood chemistry: Secondary | ICD-10-CM

## 2017-09-10 DIAGNOSIS — Z906 Acquired absence of other parts of urinary tract: Secondary | ICD-10-CM

## 2017-09-10 DIAGNOSIS — I10 Essential (primary) hypertension: Secondary | ICD-10-CM | POA: Diagnosis present

## 2017-09-10 DIAGNOSIS — K716 Toxic liver disease with hepatitis, not elsewhere classified: Secondary | ICD-10-CM

## 2017-09-10 DIAGNOSIS — I2510 Atherosclerotic heart disease of native coronary artery without angina pectoris: Secondary | ICD-10-CM | POA: Diagnosis present

## 2017-09-10 DIAGNOSIS — R5383 Other fatigue: Secondary | ICD-10-CM

## 2017-09-10 DIAGNOSIS — N3281 Overactive bladder: Secondary | ICD-10-CM

## 2017-09-10 DIAGNOSIS — Z79890 Hormone replacement therapy (postmenopausal): Secondary | ICD-10-CM

## 2017-09-10 LAB — COMPREHENSIVE METABOLIC PANEL
ALK PHOS: 234 U/L — AB (ref 38–126)
ALT: 258 U/L — ABNORMAL HIGH (ref 17–63)
ALT: 262 U/L — AB (ref 17–63)
ANION GAP: 12 (ref 5–15)
AST: 86 U/L — AB (ref 15–41)
AST: 99 U/L — ABNORMAL HIGH (ref 15–41)
Albumin: 2.6 g/dL — ABNORMAL LOW (ref 3.5–5.0)
Albumin: 2.5 g/dL — ABNORMAL LOW (ref 3.5–5.0)
Alkaline Phosphatase: 211 U/L — ABNORMAL HIGH (ref 38–126)
Anion gap: 11 (ref 5–15)
BILIRUBIN TOTAL: 0.4 mg/dL (ref 0.3–1.2)
BUN: 15 mg/dL (ref 6–20)
BUN: 18 mg/dL (ref 6–20)
CALCIUM: 7.8 mg/dL — AB (ref 8.9–10.3)
CO2: 22 mmol/L (ref 22–32)
CO2: 21 mmol/L — ABNORMAL LOW (ref 22–32)
CREATININE: 1 mg/dL (ref 0.61–1.24)
CREATININE: 1.28 mg/dL — AB (ref 0.61–1.24)
Calcium: 8.3 mg/dL — ABNORMAL LOW (ref 8.9–10.3)
Chloride: 98 mmol/L — ABNORMAL LOW (ref 101–111)
Chloride: 98 mmol/L — ABNORMAL LOW (ref 101–111)
GFR calc Af Amer: >60 mL/min (ref >60)
GFR, EST NON AFRICAN AMERICAN: 53 mL/min — AB (ref >60)
Glucose, Bld: 136 mg/dL — ABNORMAL HIGH (ref 65–99)
Glucose, Bld: 174 mg/dL — ABNORMAL HIGH (ref 65–99)
POTASSIUM: 4.5 mmol/L (ref 3.5–5.1)
Potassium: 4.7 mmol/L (ref 3.5–5.1)
SODIUM: 131 mmol/L — AB (ref 135–145)
Sodium: 131 mmol/L — ABNORMAL LOW (ref 135–145)
TOTAL PROTEIN: 7 g/dL (ref 6.5–8.1)
Total Bilirubin: 0.6 mg/dL (ref 0.3–1.2)
Total Protein: 6.9 g/dL (ref 6.5–8.1)

## 2017-09-10 LAB — CBC WITH DIFFERENTIAL/PLATELET
BASOS ABS: 0.1 10*3/uL (ref 0–0.1)
Basophils Absolute: 0 10*3/uL (ref 0–0.1)
Basophils Relative: 1 %
Basophils Relative: 0 %
EOS ABS: 0.2 10*3/uL (ref 0–0.7)
EOS PCT: 3 %
EOS PCT: 2 %
Eosinophils Absolute: 0.2 10*3/uL (ref 0–0.7)
HCT: 28.7 % — ABNORMAL LOW (ref 40.0–52.0)
HCT: 28.2 % — ABNORMAL LOW (ref 40.0–52.0)
Hemoglobin: 9.3 g/dL — ABNORMAL LOW (ref 13.0–18.0)
Hemoglobin: 9.2 g/dL — ABNORMAL LOW (ref 13.0–18.0)
LYMPHS ABS: 0.5 10*3/uL — AB (ref 1.0–3.6)
LYMPHS PCT: 10 %
LYMPHS PCT: 6 %
Lymphs Abs: 1 10*3/uL (ref 1.0–3.6)
MCH: 28.4 pg (ref 26.0–34.0)
MCH: 28.6 pg (ref 26.0–34.0)
MCHC: 32.4 g/dL (ref 32.0–36.0)
MCHC: 32.5 g/dL (ref 32.0–36.0)
MCV: 87.7 fL (ref 80.0–100.0)
MCV: 88.1 fL (ref 80.0–100.0)
MONO ABS: 0.7 10*3/uL (ref 0.2–1.0)
MONOS PCT: 7 %
Monocytes Absolute: 0.7 10*3/uL (ref 0.2–1.0)
Monocytes Relative: 7 %
Neutro Abs: 7.2 10*3/uL — ABNORMAL HIGH (ref 1.4–6.5)
Neutro Abs: 8.1 10*3/uL — ABNORMAL HIGH (ref 1.4–6.5)
Neutrophils Relative %: 79 %
Neutrophils Relative %: 85 %
PLATELETS: 277 10*3/uL (ref 150–440)
PLATELETS: 249 10*3/uL (ref 150–440)
RBC: 3.27 MIL/uL — AB (ref 4.40–5.90)
RBC: 3.21 MIL/uL — AB (ref 4.40–5.90)
RDW: 19.2 % — ABNORMAL HIGH (ref 11.5–14.5)
RDW: 19.5 % — AB (ref 11.5–14.5)
WBC: 9.1 10*3/uL (ref 3.8–10.6)
WBC: 9.6 10*3/uL (ref 3.8–10.6)

## 2017-09-10 LAB — URINALYSIS, COMPLETE (UACMP) WITH MICROSCOPIC
BILIRUBIN URINE: NEGATIVE
GLUCOSE, UA: NEGATIVE mg/dL
Ketones, ur: NEGATIVE mg/dL
NITRITE: NEGATIVE
PH: 7 (ref 5.0–8.0)
Protein, ur: 100 mg/dL — AB
SPECIFIC GRAVITY, URINE: 1.006 (ref 1.005–1.030)

## 2017-09-10 LAB — LACTIC ACID, PLASMA: LACTIC ACID, VENOUS: 1.2 mmol/L (ref 0.5–1.9)

## 2017-09-10 LAB — TROPONIN I: Troponin I: <0.03 ng/mL (ref <0.03)

## 2017-09-10 MED ORDER — CIPROFLOXACIN IN D5W 400 MG/200ML IV SOLN
400.0000 mg | Freq: Once | INTRAVENOUS | Status: AC
  Administered 2017-09-10: 400 mg via INTRAVENOUS
  Filled 2017-09-10: qty 200

## 2017-09-10 MED ORDER — SODIUM CHLORIDE 0.9 % IV SOLN
Freq: Once | INTRAVENOUS | Status: AC
Start: 2017-09-10 — End: 2017-09-10
  Administered 2017-09-10: 15:00:00 via INTRAVENOUS
  Filled 2017-09-10: qty 1000

## 2017-09-10 MED ORDER — ONDANSETRON HCL 4 MG/2ML IJ SOLN
8.0000 mg | Freq: Once | INTRAMUSCULAR | Status: AC
  Administered 2017-09-10: 8 mg via INTRAVENOUS
  Filled 2017-09-10: qty 4

## 2017-09-10 MED ORDER — SODIUM CHLORIDE 0.9 % IV BOLUS (SEPSIS)
1000.0000 mL | Freq: Once | INTRAVENOUS | Status: AC
  Administered 2017-09-10: 1000 mL via INTRAVENOUS

## 2017-09-10 MED ORDER — SODIUM CHLORIDE 0.9 % IV SOLN: Freq: Once | INTRAVENOUS | Status: DC

## 2017-09-10 MED ORDER — OMEPRAZOLE 20 MG PO CPDR: 20.0000 mg | DELAYED_RELEASE_CAPSULE | Freq: Every day | ORAL | 0 refills | Status: DC

## 2017-09-10 MED ORDER — HEPARIN SOD (PORK) LOCK FLUSH 100 UNIT/ML IV SOLN
250.0000 [IU] | Freq: Once | INTRAVENOUS | Status: DC | PRN
  Filled 2017-09-10: qty 5

## 2017-09-10 MED ORDER — PREDNISONE 10 MG PO TABS: 60.0000 mg | ORAL_TABLET | Freq: Every day | ORAL | 0 refills | Status: DC

## 2017-09-10 NOTE — ED Notes (Signed)
Pt given meal tray and water with MD permission. Pt sat up in bed at this time to eat, abx administered per MD order. Will continue to monitor for further patient needs.

## 2017-09-10 NOTE — Progress Notes (Signed)
1505 09/10/17  Joseph Ritter is hypotensive with a BP of 72/41. Ordered to give 1 L bolus with Zofran and recheck BP once fluids are completed. Will monitor closely.    1605 Rechecked patient BP at 1610 and BP was 90/53. Called Dr. Tasia Catchings and was told to send patient home and schedule appointment for Thursday.  1630  Patient went bathroom and felt weak and light headed. Rechecked BP and was back at 79/47. Ordered to send patient to ED. Report given to charge nurse. Port still accessed. Family aware.

## 2017-09-10 NOTE — ED Triage Notes (Addendum)
Patient was at cancer center today to have immune therapy and after blood work his liver enzymes were to high to receive. When taking vitals they kept getting a low reading on b/p so gave one bolus of normal saline and afterwards pressure was still low to sent patient to ER. Currently has cancer in R kidney and in the bone at his spine. Last chemo and radiation was several months ago. Patient takes eliquis daily. Port accessed from cancer center R chest

## 2017-09-10 NOTE — Progress Notes (Addendum)
[name] Follow Up visit  Patient Care Team: [name], MD as PCP - General (Physician Assistant)  REASON FOR VISIT Treatment of Locally advanced kidney urothelial cancer  ONCOLOGY HISTORY Joseph Ritter 76 y.o. male with PMH listed as below  is referred to [place] for evaluation of locally advanced kidney urothelia cancer. Patient developed gross hematuria at the end of 2017. CT 08/2016 showed endophytic hypodense mass. Repeat image 01/2017 showed enlarged  endophytic hypodense mass right kidney. 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 Right robotic nephroureterectomy was attempted but aborted due to unresectable newly metastatic disease diagnosed intraoperatively. Postop he underwent a CT scan of the abdomen and pelvis which showed extension of an infiltrative mass from the right kidney along the right renal vein toward the IVC with encasement of the IVC.  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 He self catheterize a few time a day.  He reports having back pain which limits her ability to walk. Also constipated and abdominal bloating. His appetite is fair. He takes Norco for abdominal and back pain.  He can walk with walker for very short distance, mostly limited by back pain and fatigue./weakness.  # Images: CT chest and abdomen 05/03/2017 1. Pneumoperitoneum with a small amount of ascites, gas tracking along the thoracoabdominal wall into the scrotum, and a small amount of gas in the right perirenal space-these findings are likely related to the patient's attempted nephroureterectomy yesterday. Surveillance to ensure expected resolution of the pneumoperitoneum might be appropriate. 2. The right kidney lower pole infarct with lack of patency of the more inferior of the 3 right renal arteries. There is delayed nephrogram on the right with increase an infiltrative process in theright  kidney and extending into the right renal hilar vascular structures, including the right renal vein and the adjacent IVC, compatible with tumor thrombus. There is also soft tissue density surrounding the renal hilar vascular structures and extending into the retroperitoneum and periaortic region, some of which may be tumor and some of which may be hematoma.3. Demineralization of the anterior inferior L2 vertebral body,concerning for tumor invasion. This is near the level of the retroaortic left renal vein. 4. No findings of metastatic disease to the chest. There are new trace bilateral pleural effusions with mild passive atelectasis.  #Pathology: 05/02/2017 SPECIMEN SUBMITTED: A. Hilar tissue, right kidney B. Hilar tissue, right kidney DIAGNOSIS:  A and B. SOFT TISSUE, RIGHT RENAL HILUM  PD-L1 - Urothelial (TECENTRIQ) Immunohistochemistry Analysis  Result: 0% Interpretation: No expression   # Current Treatment: 1 Palliative RT to spine 2 Chemotherapy:     06/01/2017 Cycle 1 Gemcitabine and Carboplain.  Day 8 Gemcitabine was delayed due to intractable nausea and vomiting.     Chemotherapy related thrombocytopenia, no active bleeding. 50% dose of Gemcitabine was given on day 11.     06/25/2017 Cycle 2 Day 1 Gemcitabine and Carboplain.    07/03/2017 Cycle 2 Day 8 chemotherapy discontinued.      3 Immunotherapy:     07/27/2017 cycle 1 of Atezolizumab, 11/26 2018 cycle 2 Atezolizumab was held due to hepatic toxicity.        INTERVAL HISTORY 76 yo male who has above oncology history reviewed by me today presents for evaluation for immunotherapy. Patient feels more nausea and vomited x 1. Patient was no show last Friday as it was raining and patient does not feel well. Bladder discomfort has improved  with antibiotics. . Reports feeling weak today. Denies fever or chills, abdominal pain. On 71m prednisone daily.  BP was low at 72/41. Patient did not eat much today and also had taken anti HTN medication  this morning.   Review of Systems  Constitutional: Positive for fatigue. Negative for chills and fever.  HENT:   Negative for lump/mass and nosebleeds.   Eyes: Negative for eye problems.  Respiratory: Negative for chest tightness and shortness of breath.   Cardiovascular: Negative for chest pain, leg swelling and palpitations.  Gastrointestinal: Positive for nausea and vomiting. Negative for abdominal pain and blood in stool.  Endocrine: Negative for hot flashes.  Genitourinary: Negative for difficulty urinating, dysuria and hematuria.   Musculoskeletal: Positive for back pain. Negative for flank pain and gait problem.  Skin: Negative for itching.  Neurological: Negative for dizziness and gait problem.  Hematological: Negative for adenopathy.  Psychiatric/Behavioral: Negative for confusion. The patient is not nervous/anxious.      MEDICAL HISTORY: Past Medical History:  Diagnosis Date  . Acid indigestion 03/09/2014  . Adult hypothyroidism 11/18/2012  . Anxiety   . Arteriosclerosis of coronary artery 11/18/2012   Overview:  PATENT LIMA TO LAD, PATENT SVG TO PDA AND OCCULUDED SVG TO OM2 BY CATHERIZATION 02/09/2009   . 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 Borderline diabetes 05/25/2014  . BP (high blood pressure) 11/18/2012  . BPH (benign prostatic hypertrophy)   . CAFL (chronic airflow limitation) ([facility] 05/03/2015  . [place]    renal cancer with mets  . CCF (congestive cardiac failure) ([facility] 01/03/2014   Overview:  HX OF   . COPD (chronic obstructive pulmonary disease) ([facility]   . DDD (degenerative disc disease), lumbar 06/29/2014  . 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 Gastric ulcer 03/09/2014  . 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 History of urinary self-catheterization 2017  . HLD (hyperlipidemia) 01/03/2014  . Incomplete bladder emptying  11/18/2012  . Leg weakness 11/18/2012  . Lumbar radiculitis   . Neuritis or radiculitis due to rupture of lumbar intervertebral disc 06/29/2014  . Overactive bladder   . PONV (postoperative nausea and vomiting)    years ago with Ether, no problem with Nausea or vomiting with the last few surgerys  . Pre-diabetes   . [place] transverse myel[place] 11/21/2012  . Teeth problem    pt reports "bad teeth", "need to be pulled"    SURGICAL HISTORY: Past Surgical History:  Procedure Laterality Date  . ARTERY BIOPSY Right 12/16/2015   Procedure: BIOPSY TEMPORAL ARTERY;  Surgeon: JAlgernon Huxley MD;  Location: [facility];  Service: Vascular;  Laterality: Right;  . CARDIAC CATHETERIZATION    . CATARACT EXTRACTION W/PHACO Left 01/26/2016   Procedure: CATARACT EXTRACTION PHACO AND INTRAOCULAR LENS PLACEMENT (IOC) LEFT EYE;  Surgeon: CLeandrew Koyanagi MD;  [facility]  Service: Ophthalmology;  Laterality: Left;  . CORONARY ARTERY BYPASS GRAFT  04/07/2002   [facility]  . Cysto Bladder Botox Injection  07/02/2014  . CYSTOSCOPY W/ RETROGRADES Right 03/16/2017   Procedure: CYSTOSCOPY WITH RETROGRADE PYELOGRAM;  Surgeon: BNickie Retort MD;  Location: [facility];  Service: Urology;  Laterality: Right;  . CYSTOSCOPY WITH STENT PLACEMENT Right 03/16/2017   Procedure: CYSTOSCOPY WITH STENT PLACEMENT;  Surgeon: BNickie Retort MD;  Location: [facility];  Service: Urology;  Laterality: Right;  .  EYE SURGERY Left    blood clot behind left eye  . HAND SURGERY Bilateral   . HERNIA REPAIR Right    inguinal hernia repair  . LEFT HEART CATH AND CORS/GRAFTS ANGIOGRAPHY N/A 04/25/2017   Procedure: Left Heart Cath and Cors/Grafts Angiography;  Surgeon: Isaias Cowman, MD;  Location: [facility];  Service: Cardiovascular;  Laterality: N/A;  . PORTA CATH INSERTION N/A 05/30/2017   Procedure: Glori Luis Cath Insertion;  Surgeon: Katha Cabal, MD;  Location: [facility] [facility];  Service:  Cardiovascular;  Laterality: N/A;  . ROBOT ASSITED LAPAROSCOPIC NEPHROURETERECTOMY Right 05/02/2017   Procedure: ROBOT ASSITED LAPAROSCOPIC NEPHROURETERECTOMY ATTEMPTED;  Surgeon: [name], MD;  Location: [facility];  Service: Urology;  Laterality: Right;  . URETEROSCOPY Right 03/16/2017   Procedure: URETEROSCOPY BIOPSY RENAL MASS;  Surgeon: [name], MD;  Location: [facility];  Service: Urology;  Laterality: Right;    SOCIAL HISTORY: Social History   Socioeconomic History  . Marital status: Married    Spouse name: Not on file  . Number of children: Not on file  . Years of education: Not on file  . Highest education level: Not on file  Social Needs  . Financial resource strain: Not on file  . Food insecurity - worry: Not on file  . Food insecurity - inability: Not on file  . Transportation needs - medical: Not on file  . Transportation needs - non-medical: Not on file  Occupational History  . Not on file  Tobacco Use  . Smoking status: Former Smoker    Types: Cigarettes    Last attempt to quit: 04/07/2002    Years since quitting: 15.4  . Smokeless tobacco: Former Systems developer    Types: Snuff, Chew  Substance and Sexual Activity  . Alcohol use: No    Alcohol/week: 0.0 oz  . Drug use: No  . Sexual activity: Not on file  Other Topics Concern  . Not on file  Social History Narrative  . Not on file    FAMILY HISTORY Family History  Problem Relation Age of Onset  . Heart attack Mother   . Heart disease Father   . Prostate cancer Neg Hx   . Bladder Cancer Neg Hx   . Kidney cancer Neg Hx     ALLERGIES:  has No Known Allergies.  MEDICATIONS:  Current Outpatient Medications  Medication Sig Dispense Refill  . buPROPion (WELLBUTRIN SR) 100 MG 12 hr tablet TAKE ONE TABLET BY MOUTH EVERY DAY    . cyclobenzaprine (FLEXERIL) 10 MG tablet Take 10 mg by mouth at bedtime as needed for muscle spasms.    Marland Kitchen ELIQUIS 5 MG TABS tablet TAKE ONE TABLET BY MOUTH TWICE DAILY 60  tablet 0  . ezetimibe-simvastatin (VYTORIN) 10-40 MG tablet Take 1 tablet by mouth at bedtime.    . feeding supplement, ENSURE ENLIVE, (ENSURE ENLIVE) LIQD Take 237 mLs by mouth 2 (two) times daily between meals. 60 Bottle 0  . HYDROcodone-acetaminophen (NORCO) 7.5-325 MG tablet Take 1 tablet by mouth every 4 (four) hours as needed for moderate pain. 180 tablet 0  . 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    Marland Kitchen ketoconazole (NIZORAL) 2 % shampoo APPLY SHAMPOO TO HAIR AS NEEDED FOR FLAKY/ITCHY SCALP (TYPICALLY EVERY 2-3 DAYS)    . levothyroxine (SYNTHROID, LEVOTHROID) 125 MCG tablet TAKE ONE TABLET BY MOUTH EVERY DAY BEFORE  BREAKFAST 30 tablet 0  . lidocaine (LIDODERM) 5 % Place 1 patch onto the skin every 12 (twelve) hours. Remove & Discard patch within 12 hours or as directed by MD 60 patch 11  . LORazepam (ATIVAN) 0.5 MG tablet Take 1 tablet (0.5 mg total) by mouth every 8 (eight) hours as needed for anxiety (nausea). 30 tablet 0  . morphine (MS CONTIN) 30 MG 12 hr tablet Take 2 tablets (60 mg total) by mouth every 12 (twelve) hours. 120 tablet 0  . morphine (MSIR) 15 MG tablet Take 1 tablet (15 mg total) by mouth every 6 (six) hours as needed for moderate pain or severe pain. 120 tablet 0  . Multiple Vitamin (MULTIVITAMIN WITH MINERALS) TABS tablet Take 1 tablet by mouth daily after breakfast. CENTRUM SILVER    . nitrofurantoin, macrocrystal-monohydrate, (MACROBID) 100 MG capsule Take 1 capsule (100 mg total) by mouth 2 (two) times daily. 14 capsule 0  . nystatin (MYCOSTATIN) 100000 UNIT/ML suspension   2  . PEPPERMINT OIL PO Take 1 capsule by mouth 3 (three) times daily as needed (for IBS). IBgard (active ingredient: 90 mg ultrapurified peppermint oil)    . polyethylene glycol powder (GLYCOLAX/MIRALAX) powder Take 17 g by mouth daily after breakfast. Mix with glass of water 255 g 1   . promethazine (PHENERGAN) 25 MG suppository Place 1 suppository (25 mg total) rectally every 6 (six) hours as needed for nausea. 30 suppository 0  . promethazine (PHENERGAN) 25 MG tablet Take 1 tablet (25 mg total) by mouth every 6 (six) hours as needed for nausea or vomiting. 30 tablet 0  . RABEprazole (ACIPHEX) 20 MG tablet Take 20 mg by mouth daily before breakfast.     . ramipril (ALTACE) 10 MG capsule Take 10 mg by mouth daily after breakfast.    . SENNA LAX 8.6 MG tablet TAKE TWO TABLETS BY MOUTH DAILY 120 tablet 3  . sertraline (ZOLOFT) 100 MG tablet Take 100 mg by mouth daily after breakfast.    . sodium phosphate (FLEET) 7-19 GM/118ML ENEM Place 133 mLs (1 enema total) rectally daily as needed for severe constipation. 5 enema 0  . tamsulosin (FLOMAX) 0.4 MG CAPS capsule Take 0.8 mg by mouth daily after supper.     . predniSONE (DELTASONE) 10 MG tablet Take 6 tablets (60 mg total) by mouth daily with breakfast. 48 tablet 0   No current facility-administered medications for this visit.    Facility-Administered Medications Ordered in Other Visits  Medication Dose Route Frequency Provider Last Rate Last Dose  . heparin lock flush 100 unit/mL  500 Units Intravenous Once Earlie Server, MD      . heparin lock flush 100 unit/mL  250 Units Intracatheter Once PRN Earlie Server, MD      . sodium chloride flush (NS) 0.9 % injection 10 mL  10 mL Intravenous PRN Earlie Server, MD   10 mL at 07/03/17 0930    PHYSICAL EXAMINATION:  ECOG PERFORMANCE STATUS: 2 - Symptomatic, <50% confined to bed  Vitals:   09/10/17 1433  BP: (!) 72/41  Pulse: (!) 121  Temp: (!) 95.8 F (35.4 C)    There were no vitals filed for this visit.   Physical Exam  Constitutional: He is oriented to person, place, and time. No distress.  HENT:  Head: Normocephalic and atraumatic.  Eyes: Conjunctivae and EOM are normal. Pupils are equal, round, and reactive to light. Left eye exhibits no discharge.  Neck: Normal range of  motion. Neck  supple. No JVD present. No tracheal deviation present.  Cardiovascular: Normal rate and regular rhythm.  No murmur heard. Pulmonary/Chest: No stridor. No respiratory distress. He has no wheezes. He has no rales.  Abdominal: He exhibits no distension. There is no tenderness.  Genitourinary:  Genitourinary Comments: Foley catheter.   Musculoskeletal: Normal range of motion. He exhibits no edema.  Palpable spinal tenderness.  Lymphadenopathy:    He has no cervical adenopathy.  Neurological: He is alert and oriented to person, place, and time. No cranial nerve deficit.  Skin: Skin is warm. No rash noted. No erythema.  Psychiatric: Affect normal.    ECOG 2  LABORATORY DATA: I have personally reviewed the data as listed: CBC    Component Value Date/Time   WBC 9.1 09/10/2017 1343   RBC 3.27 (L) 09/10/2017 1343   HGB 9.3 (L) 09/10/2017 1343   HGB 15.3 11/15/2012 1641   HCT 28.7 (L) 09/10/2017 1343   HCT 43.7 11/15/2012 1641   PLT 277 09/10/2017 1343   PLT 166 11/15/2012 1641   MCV 87.7 09/10/2017 1343   MCV 89 11/15/2012 1641   MCH 28.4 09/10/2017 1343   MCHC 32.4 09/10/2017 1343   RDW 19.2 (H) 09/10/2017 1343   RDW 13.3 11/15/2012 1641   LYMPHSABS 1.0 09/10/2017 1343   LYMPHSABS 1.7 11/15/2012 1641   MONOABS 0.7 09/10/2017 1343   MONOABS 0.8 11/15/2012 1641   EOSABS 0.2 09/10/2017 1343   EOSABS 0.1 11/15/2012 1641   BASOSABS 0.1 09/10/2017 1343   BASOSABS 0.0 11/15/2012 1641   CMP Latest Ref Rng & Units 09/10/2017 08/31/2017 08/20/2017  Glucose 65 - 99 mg/dL 136(H) 108(H) 139(H)  BUN 6 - 20 mg/dL 15 15 23(H)  Creatinine 0.61 - 1.24 mg/dL 1.00 0.88 1.10  Sodium 135 - 145 mmol/L 131(L) 130(L) 133(L)  Potassium 3.5 - 5.1 mmol/L 4.5 4.3 4.5  Chloride 101 - 111 mmol/L 98(L) 95(L) 98(L)  CO2 22 - 32 mmol/L 22 23 24   Calcium 8.9 - 10.3 mg/dL 8.3(L) 8.3(L) 9.0  Total Protein 6.5 - 8.1 g/dL 7.0 7.1 7.2  Total Bilirubin 0.3 - 1.2 mg/dL 0.4 0.6 0.6  Alkaline Phos 38  - 126 U/L 211(H) 147(H) 124  AST 15 - 41 U/L 86(H) 55(H) 106(H)  ALT 17 - 63 U/L 258(H) 155(H) 243(H)     RADIOGRAPHIC STUDIES: I have personally reviewed the radiological images as listed and agree with the findings in the report 5.  Aortic Atherosclerosis (ICD10-I70.0).  Coronary atherosclerosis. 6. Inflammatory stranding from the right perirenal space extends down into the pelvis and crosses the midline. There is presacral edema. 7. 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 Recommend followup by ultrasound in 3 years. This recommendation follows ACR consensus guidelines: White Paper of the ACR Incidental Findings Committee II on Vascular Findings. J Am Coll Radiol 2013; 28:003-491 8. Wall thickening along the left side of the urinary bladder may be incidental, but synchronous sessile transitional cell carcinoma is not readily excluded.  CT abdomen wo contrast 02/09/2017  Increased size of 4.6 cm ill-defined hypoenhancing mass ininterpolar region of right kidney, with obstruction of upper pole collecting system. This does not have typical appearance for pyelonephritis or renal cell carcinoma, and raises suspicion for urothelial carcinoma. Consider ureteroscopy for further evaluation. No evidence of metastatic disease. Stable mildly enlarged prostate and mild diffuse bladder wall thickening. Stable 3.0 cm infrarenal abdominal aortic aneurysm. Recommend followup by ultrasound in 3 years. This recommendation follows ACR consensus guidelines: White Paper of  the ACR Incidental Findings Committee II on Vascular Findings. [place] [phone]. Colonic diverticulosis. No radiographic evidence of diverticulitis. Stable small fat-containing left inguinal hernia. MRI thoracic and lumbar w/wo contrast.  1. Malignant invasion of the L2 vertebral body by the adjacent retroperitoneal mass. No pathologic fracture or osseous metastatic disease elsewhere in the  thoracolumbar spine. 2. Areas of epidural contrast enhancement of the lumbar spine are most consistent with a dilated epidural venous plexus secondary to invasion of the inferior vena cava by the patient's urothelial tumor. 3. Moderate L4-L5 multifactorial spinal canal stenosis with severe right and mild-to-moderate left neural foraminal stenosis. 4. Moderate right and severe left multifactorial L5-S1 neural foraminal stenosis. There is also severe attenuation of the thecal sac at this level but no bony spinal canal stenosis. 5. No spinal canal or neural foraminal stenosis in the thoracic spine.   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 Encounter for antineoplastic immunotherapy   2. LFT elevation   3. 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 Transitional cell carcinoma of kidney, right ([place])   5. Drug-induced hepatotoxicity   6. Transaminitis   # Hold immunotherapy due to hepatotoxicity.  # Hypotension: likely dehydration due to nausea and vomit.  He received one one liter of NS and one dose of zofran 45m at infusion center and BP improved to laying 93/50 and sitting 89/56. Hold HTN medication.   # Grade 2 transaminitis, likely hepatoxicity from immunotherapy. Will increase prednisone to 122mkg, about 606maily x7 with taper if improvement.  Current patient medication regimen is Morphin IR 41m39mh PRN for pain in combination with MS contin 60mg37m.   # Neoplasm related bone pain: continue current pain regimen.  Xgeva Q28 days due on 10/01/2017 .  # UTI, dysuria improved with antibiotics s/p a course of nitrofurantoin.  Urine culture showed 50,000 colonies  of staph aureus and 50,000 colonies of klebsiella pneumoniae.   His indwelling foley was exchanged today in the office by NP. Provide him Rx of leg bags x 20.   #  Constipation: continue current bowel regimen   #Hypothyroidism on Synthroid 125mcg71mly. Continue current dose. Follow up in 3 days to re-evaluate.   Addendum: patient's BP improved and planned to send him home. However, when is being checked out, he reports dizziness again and repeat BP in 70s. Will send him to ER for additional work up, rule out sepsis. Talked to ER charge nurse.   Simrin Vegh YEarlie Server12/17/2018 4:14 PM

## 2017-09-10 NOTE — H&P (Signed)
[facility] NAME: Joseph Ritter    MR#:  [id number]  DATE OF BIRTH:  1940-12-09  DATE OF ADMISSION:  09/10/2017  PRIMARY CARE PHYSICIAN: [name], MD   REQUESTING/REFERRING PHYSICIAN: Cherylann Banas, MD  CHIEF COMPLAINT:   Chief Complaint  Patient presents with  . Hypotension    HISTORY OF PRESENT ILLNESS:  Joseph Ritter  is a 76 y.o. male who presents with malaise and weakness for the past several days.  Patient was just recently treated for UTI.  However, new when he went to his oncology appointment today for follow-up he was found to be hypotensive.  He was sent here to the ED and found to have persistent UTI.  Hospitalist were called for admission  PAST MEDICAL HISTORY:   Past Medical History:  Diagnosis Date  . Acid indigestion 03/09/2014  . Adult hypothyroidism 11/18/2012  . Anxiety   . Arteriosclerosis of coronary artery 11/18/2012   Overview:  PATENT LIMA TO LAD, PATENT SVG TO PDA AND OCCULUDED SVG TO OM2 BY CATHERIZATION 02/09/2009   . 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 Borderline diabetes 05/25/2014  . BP (high blood pressure) 11/18/2012  . BPH (benign prostatic hypertrophy)   . CAFL (chronic airflow limitation) ([place]) 05/03/2015  . Cancer ([place])    renal cancer with mets  . CCF (congestive cardiac failure) ([place]) 01/03/2014   Overview:  HX OF   . COPD (chronic obstructive pulmonary disease) ([place])   . DDD (degenerative disc disease), lumbar 06/29/2014  . 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 Gastric ulcer 03/09/2014  . 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 History of urinary self-catheterization 2017  . HLD (hyperlipidemia) 01/03/2014  . Incomplete bladder emptying 11/18/2012  . Leg weakness 11/18/2012  . Lumbar radiculitis   . Neuritis or radiculitis due to  rupture of lumbar intervertebral disc 06/29/2014  . Overactive bladder   . PONV (postoperative nausea and vomiting)    years ago with Ether, no problem with Nausea or vomiting with the last few surgerys  . Pre-diabetes   . 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 Teeth problem    pt reports "bad teeth", "need to be pulled"    PAST SURGICAL HISTORY:   Past Surgical History:  Procedure Laterality Date  . ARTERY BIOPSY Right 12/16/2015   Procedure: BIOPSY TEMPORAL ARTERY;  Surgeon: Algernon Huxley, MD;  Location: [facility];  Service: Vascular;  Laterality: Right;  . CARDIAC CATHETERIZATION    . CATARACT EXTRACTION W/PHACO Left 01/26/2016   Procedure: CATARACT EXTRACTION PHACO AND INTRAOCULAR LENS PLACEMENT (IOC) LEFT EYE;  Surgeon: Leandrew Koyanagi, MD;  Location: [facility];  Service: Ophthalmology;  Laterality: Left;  . CORONARY ARTERY BYPASS GRAFT  04/07/2002   [facility]  . Cysto Bladder Botox Injection  07/02/2014  . CYSTOSCOPY W/ RETROGRADES Right 03/16/2017   Procedure: CYSTOSCOPY WITH RETROGRADE PYELOGRAM;  Surgeon: [name], MD;  Location: [facility];  Service: Urology;  Laterality: Right;  . CYSTOSCOPY WITH STENT PLACEMENT Right 03/16/2017   Procedure: CYSTOSCOPY WITH STENT PLACEMENT;  Surgeon: [name], MD;  Location: [facility];  Service: Urology;  Laterality: Right;  . EYE SURGERY Left    blood clot behind left eye  . HAND SURGERY Bilateral   . HERNIA REPAIR Right  inguinal hernia repair  . LEFT HEART CATH AND CORS/GRAFTS ANGIOGRAPHY N/A 04/25/2017   Procedure: Left Heart Cath and Cors/Grafts Angiography;  Surgeon: Isaias Cowman, MD;  Location: [facility];  Service: Cardiovascular;  Laterality: N/A;  . PORTA CATH INSERTION N/A 05/30/2017   Procedure: Glori Luis Cath Insertion;  Surgeon: Katha Cabal, MD;  Location: [facility];  Service: Cardiovascular;  Laterality: N/A;  . ROBOT ASSITED LAPAROSCOPIC NEPHROURETERECTOMY Right  05/02/2017   Procedure: ROBOT ASSITED LAPAROSCOPIC NEPHROURETERECTOMY ATTEMPTED;  Surgeon: [name], MD;  Location: [facility];  Service: Urology;  Laterality: Right;  . URETEROSCOPY Right 03/16/2017   Procedure: URETEROSCOPY BIOPSY RENAL MASS;  Surgeon: [name], MD;  Location: [facility];  Service: Urology;  Laterality: Right;    SOCIAL HISTORY:   Social History   Tobacco Use  . Smoking status: Former Smoker    Types: Cigarettes    Last attempt to quit: 04/07/2002    Years since quitting: 15.4  . Smokeless tobacco: Former Systems developer    Types: Snuff, Chew  Substance Use Topics  . Alcohol use: No    Alcohol/week: 0.0 oz    FAMILY HISTORY:   Family History  Problem Relation Age of Onset  . Heart attack Mother   . Heart disease Father   . Prostate cancer Neg Hx   . Bladder Cancer Neg Hx   . Kidney cancer Neg Hx     DRUG ALLERGIES:  No Known Allergies  MEDICATIONS AT HOME:   Prior to Admission medications   Medication Sig Start Date End Date Taking? Authorizing Provider  buPROPion (WELLBUTRIN SR) 100 MG 12 hr tablet TAKE ONE TABLET BY MOUTH EVERY DAY 05/29/17  Yes [provider]  ELIQUIS 5 MG TABS tablet TAKE ONE TABLET BY MOUTH TWICE DAILY 09/10/17  Yes Earlie Server, MD  ezetimibe-simvastatin (VYTORIN) 10-40 MG tablet Take 1 tablet by mouth at bedtime.   Yes [provider]  feeding supplement, ENSURE ENLIVE, (ENSURE ENLIVE) LIQD Take 237 mLs by mouth 2 (two) times daily between meals. 07/10/17  Yes Wieting, Richard, MD  ketoconazole (NIZORAL) 2 % shampoo APPLY SHAMPOO TO HAIR AS NEEDED FOR FLAKY/ITCHY SCALP (TYPICALLY EVERY 2-3 DAYS) 03/01/15  Yes [provider]  levothyroxine (SYNTHROID, LEVOTHROID) 125 MCG tablet TAKE ONE TABLET BY MOUTH EVERY DAY BEFORE BREAKFAST 08/06/17  Yes Earlie Server, MD  lidocaine (LIDODERM) 5 % Place 1 patch onto the skin every 12 (twelve) hours. Remove & Discard patch within 12 hours or as directed by MD 06/25/17  06/25/18 Yes Earlie Server, MD  morphine (MS CONTIN) 30 MG 12 hr tablet Take 2 tablets (60 mg total) by mouth every 12 (twelve) hours. 08/25/17  Yes Earlie Server, MD  morphine (MSIR) 15 MG tablet Take 1 tablet (15 mg total) by mouth every 6 (six) hours as needed for moderate pain or severe pain. 08/31/17  Yes Earlie Server, MD  Multiple Vitamin (MULTIVITAMIN WITH MINERALS) TABS tablet Take 1 tablet by mouth daily after breakfast. CENTRUM SILVER   Yes [provider]  polyethylene glycol powder (GLYCOLAX/MIRALAX) powder Take 17 g by mouth daily after breakfast. Mix with glass of water 05/08/17  Yes Earlie Server, MD  predniSONE (DELTASONE) 10 MG tablet Take 6 tablets (60 mg total) by mouth daily with breakfast. Patient taking differently: 10 mg daily.  09/10/17  Yes Earlie Server, MD  promethazine (PHENERGAN) 25 MG tablet Take 1 tablet (25 mg total) by mouth every 6 (six) hours as needed for  nausea or vomiting. 07/10/17  Yes Wieting, Richard, MD  RABEprazole (ACIPHEX) 20 MG tablet Take 20 mg by mouth daily before breakfast.  03/08/15  Yes [provider]  ramipril (ALTACE) 10 MG capsule Take 10 mg by mouth daily after breakfast.   Yes [provider]  sertraline (ZOLOFT) 100 MG tablet Take 100 mg by mouth daily after breakfast.   Yes [provider]  tamsulosin (FLOMAX) 0.4 MG CAPS capsule Take 0.8 mg by mouth daily after supper.  02/04/14  Yes [provider]  cyclobenzaprine (FLEXERIL) 10 MG tablet Take 10 mg by mouth at bedtime as needed for muscle spasms.    [provider]  HYDROcodone-acetaminophen (NORCO) 7.5-325 MG tablet Take 1 tablet by mouth every 4 (four) hours as needed for moderate pain. Patient not taking: Reported on 09/10/2017 07/23/17   Jacquelin Hawking, NP  hyoscyamine (ANASPAZ) 0.125 MG TBDP disintergrating tablet Place 0.125 mg under the tongue every 6 (six) hours as needed (for abdominal cramping due to IBS).    [provider]  LORazepam  (ATIVAN) 0.5 MG tablet Take 1 tablet (0.5 mg total) by mouth every 8 (eight) hours as needed for anxiety (nausea). 06/01/17   Earlie Server, MD  nitrofurantoin, macrocrystal-monohydrate, (MACROBID) 100 MG capsule Take 1 capsule (100 mg total) by mouth 2 (two) times daily. Patient not taking: Reported on 09/10/2017 08/31/17   Earlie Server, MD  nystatin (MYCOSTATIN) 100000 UNIT/ML suspension  06/25/17   [provider]  PEPPERMINT OIL PO Take 1 capsule by mouth 3 (three) times daily as needed (for IBS). IBgard (active ingredient: 90 mg ultrapurified peppermint oil)    [provider]  promethazine (PHENERGAN) 25 MG suppository Place 1 suppository (25 mg total) rectally every 6 (six) hours as needed for nausea. 07/10/17 07/10/18  Loletha Grayer, MD  SENNA LAX 8.6 MG tablet TAKE TWO TABLETS BY MOUTH DAILY 07/26/17   Earlie Server, MD  sodium phosphate (FLEET) 7-19 GM/118ML ENEM Place 133 mLs (1 enema total) rectally daily as needed for severe constipation. 07/10/17   Loletha Grayer, MD    REVIEW OF SYSTEMS:  Review of Systems  Constitutional: Positive for malaise/fatigue. Negative for chills, fever and weight loss.  HENT: Negative for ear pain, hearing loss and tinnitus.   Eyes: Negative for blurred vision, double vision, pain and redness.  Respiratory: Negative for cough, hemoptysis and shortness of breath.   Cardiovascular: Negative for chest pain, palpitations, orthopnea and leg swelling.  Gastrointestinal: Negative for abdominal pain, constipation, diarrhea, nausea and vomiting.  Genitourinary: Negative for dysuria, frequency and hematuria.  Musculoskeletal: Negative for back pain, joint pain and neck pain.  Skin:       No acne, rash, or lesions  Neurological: Positive for weakness. Negative for dizziness, tremors and focal weakness.  Endo/Heme/Allergies: Negative for polydipsia. Does not bruise/bleed easily.  Psychiatric/Behavioral: Negative for depression. The patient is not  nervous/anxious and does not have insomnia.      VITAL SIGNS:   Vitals:   09/10/17 2100 09/10/17 2200 09/10/17 2257 09/10/17 2325  BP: (!) 125/91 (!) 107/57 140/61 (!) 115/58  Pulse: 90 90 87 83  Resp: 10 18 18 18   Temp:      TempSrc:      SpO2: 100% 96% 99% 100%  Weight:      Height:       Wt Readings from Last 3 Encounters:  09/10/17 61.7 kg (136 lb)  08/31/17 61.7 kg (136 lb)  08/20/17 63.2 kg (139 lb  7 oz)    PHYSICAL EXAMINATION:  Physical Exam  Vitals reviewed. Constitutional: He is oriented to person, place, and time. He appears well-developed and well-nourished. No distress.  HENT:  Head: Normocephalic and atraumatic.  Mouth/Throat: Oropharynx is clear and moist.  Eyes: Conjunctivae and EOM are normal. Pupils are equal, round, and reactive to light. No scleral icterus.  Neck: Normal range of motion. Neck supple. No JVD present. No thyromegaly present.  Cardiovascular: Normal rate, regular rhythm and intact distal pulses. Exam reveals no gallop and no friction rub.  No murmur heard. Respiratory: Effort normal and breath sounds normal. No respiratory distress. He has no wheezes. He has no rales.  GI: Soft. Bowel sounds are normal. He exhibits no distension. There is no tenderness.  Musculoskeletal: Normal range of motion. He exhibits no edema.  No arthritis, no gout  Lymphadenopathy:    He has no cervical adenopathy.  Neurological: He is alert and oriented to person, place, and time. No cranial nerve deficit.  No dysarthria, no aphasia  Skin: Skin is warm and dry. No rash noted. No erythema.  Psychiatric: He has a normal mood and affect. His behavior is normal. Judgment and thought content normal.    LABORATORY PANEL:   CBC Recent Labs  Lab 09/10/17 1724  WBC 9.6  HGB 9.2*  HCT 28.2*  PLT 249   ------------------------------------------------------------------------------------------------------------------  Chemistries  Recent Labs  Lab  09/10/17 1724  NA 131*  K 4.7  CL 98*  CO2 21*  GLUCOSE 174*  BUN 18  CREATININE 1.28*  CALCIUM 7.8*  AST 99*  ALT 262*  ALKPHOS 234*  BILITOT 0.6   ------------------------------------------------------------------------------------------------------------------  Cardiac Enzymes Recent Labs  Lab 09/10/17 1819  TROPONINI <0.03   ------------------------------------------------------------------------------------------------------------------  RADIOLOGY:  Dg Chest Port 1 View  Result Date: 09/10/2017 CLINICAL DATA:  Low blood pressure today right renal cancer with osseous metastasis. EXAM: PORTABLE CHEST 1 VIEW COMPARISON:  Chest x-ray dated 07/06/2017. FINDINGS: Heart size and mediastinal contours are within normal limits, stable. Right chest wall Port-A-Cath appears adequately positioned with tip at the level of the mid SVC. Median sternotomy wires appear intact and stable alignment, with associated surgical changes of CABG. Lungs are clear. No pleural effusion or pneumothorax seen. Osseous structures about the chest are unremarkable. IMPRESSION: No active disease.  No evidence of pneumonia or pulmonary edema. Electronically Signed   By: Franki Cabot M.D.   On: 09/10/2017 18:38    EKG:   Orders placed or performed in visit on 09/10/17  . EKG 12-Lead    IMPRESSION AND PLAN:  Principal Problem:   UTI (urinary tract infection) -IV antibiotics ordered, urine culture sent Active Problems:   Arteriosclerosis of coronary artery -continue home meds   BP (high blood pressure) -currently normotensive, hold antihypertensives until his blood pressure rises further especially given his episode of hypotension   Renal cancer -no acute treatment to be performed during this inpatient stay, however we will continue his home dose pain medications   CKD (chronic kidney disease) stage 3, GFR 30-59 ml/min (HCC) -avoid nephrotoxins and monitor   HLD (hyperlipidemia) -continue home meds    Adult hypothyroidism -home dose thyroid replacement  All the records are reviewed and case discussed with ED provider. Management plans discussed with the patient and/or family.  DVT PROPHYLAXIS: Systemic anticoagulation  GI PROPHYLAXIS: PPI at home dose  ADMISSION STATUS: Inpatient  CODE STATUS: DNR Code Status History    Date Active Date Inactive Code Status Order ID Comments  User Context   07/07/2017 13:44 07/10/2017 21:32 DNR [id number]  Loletha Grayer, MD Inpatient   07/07/2017 01:41 07/07/2017 13:44 Full Code [id number]  Harvie Bridge, DO Inpatient   05/02/2017 17:04 05/04/2017 18:51 Full Code [id number]  [name], MD Inpatient   04/25/2017 08:51 04/25/2017 14:36 Full Code [id number]  Isaias Cowman, MD Inpatient    Questions for Most Recent Historical Code Status (Order [id number])    Question Answer Comment   In the event of cardiac or respiratory ARREST Do not call a "code blue"    In the event of cardiac or respiratory ARREST Do not perform Intubation, CPR, defibrillation or ACLS    In the event of cardiac or respiratory ARREST Use medication by any route, position, wound care, and other measures to relive pain and suffering. May use oxygen, suction and manual treatment of airway obstruction as needed for comfort.    Comments nurse may pronounce         Advance Directive Documentation     Most Recent Value  Type of Advance Directive  Healthcare Power of Attorney, Living will  Pre-existing out of facility DNR order (yellow form or pink MOST form)  No data  "MOST" Form in Place?  No data      TOTAL TIME TAKING CARE OF THIS PATIENT: 45 minutes.   Ernst Cumpston Lamar 09/10/2017, 11:57 PM  [facility]  Office  [phone]  CC: Primary care physician; [name], MD  Note:  This document was prepared using Dragon voice recognition software and may include unintentional dictation errors.

## 2017-09-10 NOTE — ED Provider Notes (Signed)
[facility] Emergency Department Provider Note ____________________________________________   First MD Initiated Contact with Patient 09/10/17 1737     (approximate)  I have reviewed the triage vital signs and the nursing notes.   HISTORY  Chief Complaint Hypotension    HPI Joseph Ritter is a 76 y.o. male with history of metastatic renal cancer and other past medical history as noted below who presents with hypotension, occurring today when he was being seen at the cancer center for immune therapy, and measured 270s over 58s.  Patient reports associated weakness and lightheadedness.  The hypotension did not improve with fluids given at the cancer center.  Patient states that he has been feeling weaker than usual in the last 1-2 days, and also had nausea with several episodes of vomiting earlier today.  He denies other acute symptoms.  Past Medical History:  Diagnosis Date  . Acid indigestion 03/09/2014  . Adult hypothyroidism 11/18/2012  . Anxiety   . Arteriosclerosis of coronary artery 11/18/2012   Overview:  PATENT LIMA TO LAD, PATENT SVG TO PDA AND OCCULUDED SVG TO OM2 BY CATHERIZATION 02/09/2009   . 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 Borderline diabetes 05/25/2014  . BP (high blood pressure) 11/18/2012  . BPH (benign prostatic hypertrophy)   . CAFL (chronic airflow limitation) ([place]) 05/03/2015  . Cancer ([place])    renal cancer with mets  . CCF (congestive cardiac failure) ([place]) 01/03/2014   Overview:  HX OF   . COPD (chronic obstructive pulmonary disease) ([place])   . DDD (degenerative disc disease), lumbar 06/29/2014  . 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 Gastric ulcer 03/09/2014  . 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 History of urinary self-catheterization 2017  . HLD (hyperlipidemia) 01/03/2014  . Incomplete  bladder emptying 11/18/2012  . Leg weakness 11/18/2012  . Lumbar radiculitis   . Neuritis or radiculitis due to rupture of lumbar intervertebral disc 06/29/2014  . Overactive bladder   . PONV (postoperative nausea and vomiting)    years ago with Ether, no problem with Nausea or vomiting with the last few surgerys  . Pre-diabetes   . 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 Teeth problem    pt reports "bad teeth", "need to be pulled"    Patient Active Problem List   Diagnosis Date Noted  . Sepsis secondary to UTI ([place]) 07/07/2017  . Malignant neoplasm of right kidney ([place])   . Goals of care, counseling/discussion 05/25/2017  . 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 CKD (chronic kidney disease) stage 3, GFR 30-59 ml/min (HCC) 05/08/2017  . Transitional cell carcinoma of kidney, right ([place]) 05/02/2017  . 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 Gross hematuria 08/15/2016  . Dysuria 08/15/2016  . Acute cystitis with hematuria 08/15/2016  . CAFL (chronic airflow limitation) ([place]) 05/03/2015  . Clinical depression 05/03/2015  . 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 DDD (degenerative disc disease), lumbar 06/29/2014  . Neuritis or radiculitis due to rupture of lumbar intervertebral disc 06/29/2014  . 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 Borderline diabetes 05/25/2014  . Blood glucose elevated 05/25/2014  . Acid indigestion 03/09/2014  . Gastric ulcer 03/09/2014  . CCF (congestive cardiac failure) ([place]) 01/03/2014  . HLD (hyperlipidemia) 01/03/2014  . 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 Subacute transverse myelitis (  [place]) 11/21/2012  . 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 Arteriosclerosis of coronary artery 11/18/2012  . BP (high blood pressure) 11/18/2012  . Adult hypothyroidism 11/18/2012  . Incomplete bladder emptying 11/18/2012  . Leg weakness 11/18/2012  . H/O coronary artery bypass surgery 04/07/2002    Past  Surgical History:  Procedure Laterality Date  . ARTERY BIOPSY Right 12/16/2015   Procedure: BIOPSY TEMPORAL ARTERY;  Surgeon: Algernon Huxley, MD;  Location: [facility];  Service: Vascular;  Laterality: Right;  . CARDIAC CATHETERIZATION    . CATARACT EXTRACTION W/PHACO Left 01/26/2016   Procedure: CATARACT EXTRACTION PHACO AND INTRAOCULAR LENS PLACEMENT (IOC) LEFT EYE;  Surgeon: Leandrew Koyanagi, MD;  Location: [facility];  Service: Ophthalmology;  Laterality: Left;  . CORONARY ARTERY BYPASS GRAFT  04/07/2002   [facility]  . Cysto Bladder Botox Injection  07/02/2014  . CYSTOSCOPY W/ RETROGRADES Right 03/16/2017   Procedure: CYSTOSCOPY WITH RETROGRADE PYELOGRAM;  Surgeon: [name], MD;  Location: [facility];  Service: Urology;  Laterality: Right;  . CYSTOSCOPY WITH STENT PLACEMENT Right 03/16/2017   Procedure: CYSTOSCOPY WITH STENT PLACEMENT;  Surgeon: [name], MD;  Location: [facility];  Service: Urology;  Laterality: Right;  . EYE SURGERY Left    blood clot behind left eye  . HAND SURGERY Bilateral   . HERNIA REPAIR Right    inguinal hernia repair  . LEFT HEART CATH AND CORS/GRAFTS ANGIOGRAPHY N/A 04/25/2017   Procedure: Left Heart Cath and Cors/Grafts Angiography;  Surgeon: Isaias Cowman, MD;  Location: [facility];  Service: Cardiovascular;  Laterality: N/A;  . PORTA CATH INSERTION N/A 05/30/2017   Procedure: Glori Luis Cath Insertion;  Surgeon: Katha Cabal, MD;  Location: [facility];  Service: Cardiovascular;  Laterality: N/A;  . ROBOT ASSITED LAPAROSCOPIC NEPHROURETERECTOMY Right 05/02/2017   Procedure: ROBOT ASSITED LAPAROSCOPIC NEPHROURETERECTOMY ATTEMPTED;  Surgeon: [name], MD;  Location: [facility];  Service: Urology;  Laterality: Right;  . URETEROSCOPY Right 03/16/2017   Procedure: URETEROSCOPY BIOPSY RENAL MASS;  Surgeon: [name], MD;  Location: [facility];  Service: Urology;  Laterality: Right;    Prior to Admission  medications   Medication Sig Start Date End Date Taking? Authorizing Provider  buPROPion (WELLBUTRIN SR) 100 MG 12 hr tablet TAKE ONE TABLET BY MOUTH EVERY DAY 05/29/17  Yes [provider]  ELIQUIS 5 MG TABS tablet TAKE ONE TABLET BY MOUTH TWICE DAILY 09/10/17  Yes Earlie Server, MD  ezetimibe-simvastatin (VYTORIN) 10-40 MG tablet Take 1 tablet by mouth at bedtime.   Yes [provider]  feeding supplement, ENSURE ENLIVE, (ENSURE ENLIVE) LIQD Take 237 mLs by mouth 2 (two) times daily between meals. 07/10/17  Yes Wieting, Richard, MD  ketoconazole (NIZORAL) 2 % shampoo APPLY SHAMPOO TO HAIR AS NEEDED FOR FLAKY/ITCHY SCALP (TYPICALLY EVERY 2-3 DAYS) 03/01/15  Yes [provider]  levothyroxine (SYNTHROID, LEVOTHROID) 125 MCG tablet TAKE ONE TABLET BY MOUTH EVERY DAY BEFORE BREAKFAST 08/06/17  Yes Earlie Server, MD  lidocaine (LIDODERM) 5 % Place 1 patch onto the skin every 12 (twelve) hours. Remove & Discard patch within 12 hours or as directed by MD 06/25/17 06/25/18 Yes Earlie Server, MD  morphine (MS CONTIN) 30 MG 12 hr tablet Take 2 tablets (60 mg total) by mouth every 12 (twelve) hours. 08/25/17  Yes Earlie Server, MD  morphine (MSIR) 15 MG tablet Take 1 tablet (15 mg total) by mouth every 6 (six) hours as needed for moderate pain or  severe pain. 08/31/17  Yes Earlie Server, MD  Multiple Vitamin (MULTIVITAMIN WITH MINERALS) TABS tablet Take 1 tablet by mouth daily after breakfast. CENTRUM SILVER   Yes [provider]  polyethylene glycol powder (GLYCOLAX/MIRALAX) powder Take 17 g by mouth daily after breakfast. Mix with glass of water 05/08/17  Yes Earlie Server, MD  predniSONE (DELTASONE) 10 MG tablet Take 6 tablets (60 mg total) by mouth daily with breakfast. Patient taking differently: 10 mg daily.  09/10/17  Yes Earlie Server, MD  promethazine (PHENERGAN) 25 MG tablet Take 1 tablet (25 mg total) by mouth every 6 (six) hours as needed for nausea or vomiting. 07/10/17  Yes Wieting, Richard, MD    RABEprazole (ACIPHEX) 20 MG tablet Take 20 mg by mouth daily before breakfast.  03/08/15  Yes [provider]  ramipril (ALTACE) 10 MG capsule Take 10 mg by mouth daily after breakfast.   Yes [provider]  sertraline (ZOLOFT) 100 MG tablet Take 100 mg by mouth daily after breakfast.   Yes [provider]  tamsulosin (FLOMAX) 0.4 MG CAPS capsule Take 0.8 mg by mouth daily after supper.  02/04/14  Yes [provider]  cyclobenzaprine (FLEXERIL) 10 MG tablet Take 10 mg by mouth at bedtime as needed for muscle spasms.    [provider]  HYDROcodone-acetaminophen (NORCO) 7.5-325 MG tablet Take 1 tablet by mouth every 4 (four) hours as needed for moderate pain. Patient not taking: Reported on 09/10/2017 07/23/17   Jacquelin Hawking, NP  hyoscyamine (ANASPAZ) 0.125 MG TBDP disintergrating tablet Place 0.125 mg under the tongue every 6 (six) hours as needed (for abdominal cramping due to IBS).    [provider]  LORazepam (ATIVAN) 0.5 MG tablet Take 1 tablet (0.5 mg total) by mouth every 8 (eight) hours as needed for anxiety (nausea). 06/01/17   Earlie Server, MD  nitrofurantoin, macrocrystal-monohydrate, (MACROBID) 100 MG capsule Take 1 capsule (100 mg total) by mouth 2 (two) times daily. Patient not taking: Reported on 09/10/2017 08/31/17   Earlie Server, MD  nystatin (MYCOSTATIN) 100000 UNIT/ML suspension  06/25/17   [provider]  PEPPERMINT OIL PO Take 1 capsule by mouth 3 (three) times daily as needed (for IBS). IBgard (active ingredient: 90 mg ultrapurified peppermint oil)    [provider]  promethazine (PHENERGAN) 25 MG suppository Place 1 suppository (25 mg total) rectally every 6 (six) hours as needed for nausea. 07/10/17 07/10/18  Loletha Grayer, MD  SENNA LAX 8.6 MG tablet TAKE TWO TABLETS BY MOUTH DAILY 07/26/17   Earlie Server, MD  sodium phosphate (FLEET) 7-19 GM/118ML ENEM Place 133 mLs (1 enema total) rectally daily as needed for  severe constipation. 07/10/17   Loletha Grayer, MD    Allergies Patient has no known allergies.  Family History  Problem Relation Age of Onset  . Heart attack Mother   . Heart disease Father   . Prostate cancer Neg Hx   . Bladder Cancer Neg Hx   . Kidney cancer Neg Hx     Social History Social History   Tobacco Use  . Smoking status: Former Smoker    Types: Cigarettes    Last attempt to quit: 04/07/2002    Years since quitting: 15.4  . Smokeless tobacco: Former Systems developer    Types: Snuff, Chew  Substance Use Topics  . Alcohol use: No    Alcohol/week: 0.0 oz  . Drug use: No    Review of Systems  Constitutional: No fever.  Positive for  generalized weakness. Eyes: No redness. ENT: No sore throat. Cardiovascular: Denies chest pain. Respiratory: Positive for shortness of breath. Gastrointestinal: Positive for nausea vomiting. Genitourinary: Negative for dysuria or frequency..  Musculoskeletal: Negative for back pain. Skin: Negative for rash. Neurological: Negative for headache.    ____________________________________________   PHYSICAL EXAM:  VITAL SIGNS: ED Triage Vitals  Enc Vitals Group     BP 09/10/17 1725 (!) 76/49     Pulse Rate 09/10/17 1725 (!) 110     Resp 09/10/17 1725 18     Temp 09/10/17 1721 98.4 F (36.9 C)     Temp Source 09/10/17 1721 Oral     SpO2 09/10/17 1725 94 %     Weight 09/10/17 1721 136 lb (61.7 kg)     Height 09/10/17 1721 5\' 7"  (1.702 m)     Head Circumference --      Peak Flow --      Pain Score 09/10/17 1720 7     Pain Loc --      Pain Edu? --      Excl. in [place]? --     Constitutional: Alert and oriented. Well appearing and in no acute distress. Eyes: Conjunctivae are normal.  Head: Atraumatic. Nose: No congestion/rhinnorhea. Mouth/Throat: Mucous membranes are dry.   Neck: Normal range of motion.  Cardiovascular: Tachycardic, regular rhythm. Grossly normal heart sounds.  Good peripheral circulation. Respiratory: Normal  respiratory effort.  No retractions. Lungs CTAB. Gastrointestinal: Soft and nontender. No distention.  Genitourinary: No CVA tenderness. Musculoskeletal: No lower extremity edema.  Extremities warm and well perfused.  Neurologic:  Normal speech and language. No gross focal neurologic deficits are appreciated.  Skin:  Skin is warm and dry. No rash noted. Psychiatric: Mood and affect are normal. Speech and behavior are normal.  ____________________________________________   LABS (all labs ordered are listed, but only abnormal results are displayed)  Labs Reviewed  COMPREHENSIVE METABOLIC PANEL - Abnormal; Notable for the following components:      Result Value   Sodium 131 (*)    Chloride 98 (*)    CO2 21 (*)    Glucose, Bld 174 (*)    Creatinine, Ser 1.28 (*)    Calcium 7.8 (*)    Albumin 2.5 (*)    AST 99 (*)    ALT 262 (*)    Alkaline Phosphatase 234 (*)    GFR calc non Af Amer 53 (*)    All other components within normal limits  URINALYSIS, COMPLETE (UACMP) WITH MICROSCOPIC - Abnormal; Notable for the following components:   Color, Urine YELLOW (*)    APPearance CLOUDY (*)    Hgb urine dipstick MODERATE (*)    Protein, ur 100 (*)    Leukocytes, UA LARGE (*)    Bacteria, UA MANY (*)    Squamous Epithelial / LPF 0-5 (*)    All other components within normal limits  CBC WITH DIFFERENTIAL/PLATELET - Abnormal; Notable for the following components:   RBC 3.21 (*)    Hemoglobin 9.2 (*)    HCT 28.2 (*)    RDW 19.5 (*)    Neutro Abs 8.1 (*)    Lymphs Abs 0.5 (*)    All other components within normal limits  CULTURE, BLOOD (ROUTINE X 2)  CULTURE, BLOOD (ROUTINE X 2)  LACTIC ACID, PLASMA  TROPONIN I   ____________________________________________  EKG  ED ECG REPORT I, Arta Silence, the attending physician, personally viewed and interpreted this ECG.  Date: 09/10/2017 EKG Time: 2202  Rate: 88 Rhythm: normal sinus rhythm QRS Axis: normal Intervals: normal ST/T  Wave abnormalities: normal Narrative Interpretation: no evidence of acute ischemia  ____________________________________________  RADIOLOGY  CXR: No focal infiltrate  ____________________________________________   PROCEDURES  Procedure(s) performed: No    Critical Care performed: No ____________________________________________   INITIAL IMPRESSION / ASSESSMENT AND PLAN / ED COURSE  Pertinent labs & imaging results that were available during my care of the patient were reviewed by me and considered in my medical decision making (see chart for details).  76 year old male with history of static renal cancer and other past medical history as noted above presents with hypotension and generalized weakness today as well as some nausea and vomiting earlier.  He was seen at the cancer center for immune therapy today, however his liver enzymes were too elevated and he was unable to get up.  At that time he was noted to be hypotensive and the pressure did not respond to fluid bolus given there.  Past medical records reviewed in Epic and reveal an ED visit last month for dehydration, otherwise noncontributory.  On exam, patient is somewhat chronically ill and weak appearing, but not acutely toxic.  He is tachycardic and hypotensive, but the other vital signs are normal.  The remainder the exam is unremarkable except for dry mucous memories.  Differential includes infection/sepsis, dehydration or other metabolic cause, less likely cancer related, or cardiac etiology.  Plan for infection/sepsis workup, fluids, basic and cardiac labs, and reassess.    ----------------------------------------- 9:41 PM on 09/10/2017 -----------------------------------------  The workup reveals UA consistent with UTI.  Chest x-ray shows no signs of pneumonia.  The remainder of patient's labs are not significantly changed from patient's baseline.  I will give antibiotic based on prior urine cultures.  Because of  the patient's comorbidities, as well as the fact that he was significantly hypotensive, as well as based on discussion with patient, I will admit for IV antibiotics and fluid resuscitation.  ____________________________________________   FINAL CLINICAL IMPRESSION(S) / ED DIAGNOSES  Final diagnoses:  Urinary tract infection without hematuria, site unspecified  Hypotension, unspecified hypotension type      NEW MEDICATIONS STARTED DURING THIS VISIT:  This SmartLink is deprecated. Use AVSMEDLIST instead to display the medication list for a patient.   Note:  This document was prepared using Dragon voice recognition software and may include unintentional dictation errors.    Arta Silence, MD 09/10/17 2235

## 2017-09-11 ENCOUNTER — Other Ambulatory Visit: Payer: Self-pay | Admitting: Oncology

## 2017-09-11 ENCOUNTER — Other Ambulatory Visit: Payer: Self-pay

## 2017-09-11 LAB — BASIC METABOLIC PANEL
Anion gap: 7 (ref 5–15)
BUN: 18 mg/dL (ref 6–20)
CALCIUM: 7.2 mg/dL — AB (ref 8.9–10.3)
CO2: 21 mmol/L — AB (ref 22–32)
CREATININE: 1.19 mg/dL (ref 0.61–1.24)
Chloride: 103 mmol/L (ref 101–111)
GFR calc Af Amer: >60 mL/min (ref >60)
GFR, EST NON AFRICAN AMERICAN: 58 mL/min — AB (ref >60)
GLUCOSE: 84 mg/dL (ref 65–99)
Potassium: 4.1 mmol/L (ref 3.5–5.1)
Sodium: 131 mmol/L — ABNORMAL LOW (ref 135–145)

## 2017-09-11 LAB — CBC
HCT: 23.7 % — ABNORMAL LOW (ref 40.0–52.0)
Hemoglobin: 7.7 g/dL — ABNORMAL LOW (ref 13.0–18.0)
MCH: 28.4 pg (ref 26.0–34.0)
MCHC: 32.6 g/dL (ref 32.0–36.0)
MCV: 87 fL (ref 80.0–100.0)
Platelets: 198 10*3/uL (ref 150–440)
RBC: 2.73 MIL/uL — ABNORMAL LOW (ref 4.40–5.90)
RDW: 19.2 % — AB (ref 11.5–14.5)
WBC: 6.5 10*3/uL (ref 3.8–10.6)

## 2017-09-11 MED ORDER — EZETIMIBE-SIMVASTATIN 10-40 MG PO TABS: 1.0000 | ORAL_TABLET | Freq: Every day | ORAL | Status: DC

## 2017-09-11 MED ORDER — PANTOPRAZOLE SODIUM 40 MG PO TBEC
40.0000 mg | DELAYED_RELEASE_TABLET | Freq: Every day | ORAL | Status: DC
Start: 2017-09-11 — End: 2017-09-12
  Administered 2017-09-11 – 2017-09-12 (×2): 40 mg via ORAL
  Filled 2017-09-11 (×2): qty 1

## 2017-09-11 MED ORDER — MORPHINE SULFATE ER 30 MG PO TBCR
60.0000 mg | EXTENDED_RELEASE_TABLET | Freq: Two times a day (BID) | ORAL | Status: DC
  Administered 2017-09-11 – 2017-09-12 (×4): 60 mg via ORAL
  Filled 2017-09-11 (×4): qty 2

## 2017-09-11 MED ORDER — DEXTROSE 5 % IV SOLN
1.0000 g | INTRAVENOUS | Status: DC
  Administered 2017-09-11 – 2017-09-12 (×2): 1 g via INTRAVENOUS
  Filled 2017-09-11 (×2): qty 10

## 2017-09-11 MED ORDER — PREDNISONE 50 MG PO TABS
60.0000 mg | ORAL_TABLET | Freq: Every day | ORAL | Status: DC
  Administered 2017-09-11 – 2017-09-12 (×2): 60 mg via ORAL
  Filled 2017-09-11 (×2): qty 1

## 2017-09-11 MED ORDER — ONDANSETRON HCL 4 MG/2ML IJ SOLN
4.0000 mg | Freq: Four times a day (QID) | INTRAMUSCULAR | Status: DC | PRN
  Administered 2017-09-12: 10:00:00 4 mg via INTRAVENOUS
  Filled 2017-09-11: qty 2

## 2017-09-11 MED ORDER — EZETIMIBE 10 MG PO TABS
10.0000 mg | ORAL_TABLET | Freq: Every day | ORAL | Status: DC
  Administered 2017-09-11: 10 mg via ORAL
  Filled 2017-09-11 (×2): qty 1

## 2017-09-11 MED ORDER — SERTRALINE HCL 50 MG PO TABS
100.0000 mg | ORAL_TABLET | Freq: Every day | ORAL | Status: DC
  Administered 2017-09-11 – 2017-09-12 (×2): 100 mg via ORAL
  Filled 2017-09-11 (×2): qty 2

## 2017-09-11 MED ORDER — SIMVASTATIN 20 MG PO TABS
40.0000 mg | ORAL_TABLET | Freq: Every day | ORAL | Status: DC
  Administered 2017-09-11: 40 mg via ORAL
  Filled 2017-09-11: qty 2

## 2017-09-11 MED ORDER — APIXABAN 5 MG PO TABS
5.0000 mg | ORAL_TABLET | Freq: Two times a day (BID) | ORAL | Status: DC
  Administered 2017-09-11 – 2017-09-12 (×4): 5 mg via ORAL
  Filled 2017-09-11 (×4): qty 1

## 2017-09-11 MED ORDER — LORAZEPAM 0.5 MG PO TABS: 0.5000 mg | ORAL_TABLET | Freq: Three times a day (TID) | ORAL | Status: DC | PRN

## 2017-09-11 MED ORDER — MORPHINE SULFATE 15 MG PO TABS
15.0000 mg | ORAL_TABLET | Freq: Four times a day (QID) | ORAL | Status: DC | PRN
  Administered 2017-09-11: 13:00:00 15 mg via ORAL
  Filled 2017-09-11: qty 1

## 2017-09-11 MED ORDER — BUPROPION HCL ER (SR) 100 MG PO TB12
100.0000 mg | ORAL_TABLET | Freq: Every day | ORAL | Status: DC
  Administered 2017-09-11 – 2017-09-12 (×2): 100 mg via ORAL
  Filled 2017-09-11 (×2): qty 1

## 2017-09-11 MED ORDER — TAMSULOSIN HCL 0.4 MG PO CAPS
0.8000 mg | ORAL_CAPSULE | Freq: Every day | ORAL | Status: DC
  Administered 2017-09-11: 0.8 mg via ORAL
  Filled 2017-09-11: qty 2

## 2017-09-11 MED ORDER — ONDANSETRON HCL 4 MG PO TABS: 4.0000 mg | ORAL_TABLET | Freq: Four times a day (QID) | ORAL | Status: DC | PRN

## 2017-09-11 MED ORDER — HYOSCYAMINE SULFATE 0.125 MG PO TBDP
0.1250 mg | ORAL_TABLET | Freq: Four times a day (QID) | ORAL | Status: DC | PRN
  Filled 2017-09-11: qty 1

## 2017-09-11 MED ORDER — ENSURE ENLIVE PO LIQD: 237.0000 mL | Freq: Two times a day (BID) | ORAL | Status: DC

## 2017-09-11 MED ORDER — RAMIPRIL 10 MG PO CAPS
10.0000 mg | ORAL_CAPSULE | Freq: Every day | ORAL | Status: DC
  Administered 2017-09-12: 10 mg via ORAL
  Filled 2017-09-11 (×2): qty 1

## 2017-09-11 MED ORDER — CYCLOBENZAPRINE HCL 10 MG PO TABS: 10.0000 mg | ORAL_TABLET | Freq: Every evening | ORAL | Status: DC | PRN

## 2017-09-11 MED ORDER — ACETAMINOPHEN 325 MG PO TABS: 650.0000 mg | ORAL_TABLET | Freq: Four times a day (QID) | ORAL | Status: DC | PRN

## 2017-09-11 MED ORDER — INFLUENZA VAC SPLIT HIGH-DOSE 0.5 ML IM SUSY
0.5000 mL | PREFILLED_SYRINGE | INTRAMUSCULAR | Status: AC
  Administered 2017-09-12: 0.5 mL via INTRAMUSCULAR
  Filled 2017-09-11: qty 0.5

## 2017-09-11 MED ORDER — ACETAMINOPHEN 650 MG RE SUPP: 650.0000 mg | Freq: Four times a day (QID) | RECTAL | Status: DC | PRN

## 2017-09-11 MED ORDER — LEVOTHYROXINE SODIUM 25 MCG PO TABS
125.0000 ug | ORAL_TABLET | Freq: Every day | ORAL | Status: DC
  Administered 2017-09-11 – 2017-09-12 (×2): 125 ug via ORAL
  Filled 2017-09-11 (×2): qty 1

## 2017-09-11 MED ORDER — LIDOCAINE 5 % EX PTCH
1.0000 | MEDICATED_PATCH | Freq: Two times a day (BID) | CUTANEOUS | Status: DC
  Administered 2017-09-11 – 2017-09-12 (×2): 1 via TRANSDERMAL
  Filled 2017-09-11 (×4): qty 1

## 2017-09-11 NOTE — Progress Notes (Signed)
[facility] [place] NAME: Joseph Ritter    MR#:  [id number]  DATE OF BIRTH:  04-11-1941  SUBJECTIVE:   Patient here due to hypotension, abnormal LFTs, and suspected to have urinary tract infection. She was complaining of back pain presently. Wife is at bedside. No nausea, vomiting, dysuria. Patient has a chronic indwelling Foley.  REVIEW OF SYSTEMS:    Review of Systems  Constitutional: Negative for chills and fever.  HENT: Negative for congestion and tinnitus.   Eyes: Negative for blurred vision and double vision.  Respiratory: Negative for cough, shortness of breath and wheezing.   Cardiovascular: Negative for chest pain, orthopnea and PND.  Gastrointestinal: Negative for abdominal pain, diarrhea, nausea and vomiting.  Genitourinary: Negative for dysuria and hematuria.  Neurological: Negative for dizziness, sensory change and focal weakness.  All other systems reviewed and are negative.   Nutrition: Heart healthy Tolerating Diet: Yes Tolerating PT: Await Eval.   DRUG ALLERGIES:  No Known Allergies  VITALS:  Blood pressure (!) 111/51, pulse 84, temperature 98.8 F (37.1 C), temperature source Oral, resp. rate 20, height 5\' 7"  (1.702 m), weight 64.4 kg (142 lb), SpO2 97 %.  PHYSICAL EXAMINATION:   Physical Exam  GENERAL:  76 y.o.-year-old patient lying in bed in no acute distress.  EYES: Pupils equal, round, reactive to light and accommodation. No scleral icterus. Extraocular muscles intact.  HEENT: Head atraumatic, normocephalic. Oropharynx and nasopharynx clear.  NECK:  Supple, no jugular venous distention. No thyroid enlargement, no tenderness.  LUNGS: Normal breath sounds bilaterally, no wheezing, rales, rhonchi. No use of accessory muscles of respiration.  CARDIOVASCULAR: S1, S2 normal. No murmurs, rubs, or gallops. Right chest wall port-a-cath in place.  ABDOMEN: Soft, nontender, nondistended. Bowel sounds present. No  organomegaly or mass.  EXTREMITIES: No cyanosis, clubbing or edema b/l.    NEUROLOGIC: Cranial nerves II through XII are intact. No focal Motor or sensory deficits b/l.   PSYCHIATRIC: The patient is alert and oriented x 3.  SKIN: No obvious rash, lesion, or ulcer.    LABORATORY PANEL:   CBC Recent Labs  Lab 09/11/17 0420  WBC 6.5  HGB 7.7*  HCT 23.7*  PLT 198   ------------------------------------------------------------------------------------------------------------------  Chemistries  Recent Labs  Lab 09/10/17 1724 09/11/17 0420  NA 131* 131*  K 4.7 4.1  CL 98* 103  CO2 21* 21*  GLUCOSE 174* 84  BUN 18 18  CREATININE 1.28* 1.19  CALCIUM 7.8* 7.2*  AST 99*  --   ALT 262*  --   ALKPHOS 234*  --   BILITOT 0.6  --    ------------------------------------------------------------------------------------------------------------------  Cardiac Enzymes Recent Labs  Lab 09/10/17 1819  TROPONINI <0.03   ------------------------------------------------------------------------------------------------------------------  RADIOLOGY:  Dg Chest Port 1 View  Result Date: 09/10/2017 CLINICAL DATA:  Low blood pressure today right renal cancer with osseous metastasis. EXAM: PORTABLE CHEST 1 VIEW COMPARISON:  Chest x-ray dated 07/06/2017. FINDINGS: Heart size and mediastinal contours are within normal limits, stable. Right chest wall Port-A-Cath appears adequately positioned with tip at the level of the mid SVC. Median sternotomy wires appear intact and stable alignment, with associated surgical changes of CABG. Lungs are clear. No pleural effusion or pneumothorax seen. Osseous structures about the chest are unremarkable. IMPRESSION: No active disease.  No evidence of pneumonia or pulmonary edema. Electronically Signed   By: Franki Cabot M.D.   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  1. Sepsis-patient presented to the hospital from the oncologists office due to hypotension, tachycardia, and urinalysis suggestive of UTI. After IV fluids and IV antibiotics patient's hemodynamics have improved. His cultures remained negative. - he has clinically improved and hemodynamically stable.  2. Urinary tract infection-continue IV ceftriaxone, follow urine cultures. Patient has a chronic indwelling Foley and is likely colonized. He recently had a urinary tract infection with staph aureus and Klebsiella about a month ago. -Both those bugs were sensitive to ceftriaxone.  3. Abnormal LFTs-secondary to immunotherapy he is getting for his malignancy. -Discussed with oncology, immunotherapy is on hold, continue follow-up with oncology as an outpatient and follow LFTs. -Continue high-dose steroids as per oncology requests.  4. History of transitional cell carcinoma of the bladder/kidney-this is metastatic to the bone. -Continue lidocaine patch, MS Contin for his chronic pain. Seen by oncology and follow up with them as an outpatient. Immunotherapy and hold due to abnormal LFTs.  5. Essential hypertension-continue ramipril.  6. GERD-continue Protonix.  7. Hypothyroidism-continue Synthroid.  8. Anxiety/depression-continue Zoloft, Ativan as needed.  9. Hyperlipidemia-continue simvastatin, Zetia.   All the records are reviewed and case discussed with Care Management/Social Worker. Management plans discussed with the patient, family and they are in agreement.  CODE STATUS: DNR  DVT Prophylaxis: Eliquis  TOTAL TIME TAKING CARE OF THIS PATIENT: 30 minutes.   POSSIBLE D/C IN 1-2 DAYS, DEPENDING ON CLINICAL CONDITION.   Henreitta Leber M.D on 09/11/2017 at 2:17 PM  Between  7am to 6pm - Pager - [phone]  After 6pm go to [email or web address] - Proofreader  [facility]  Office  [phone]  CC: Primary care physician; [name], MD

## 2017-09-11 NOTE — Progress Notes (Signed)
Pts Hgb from time of arrival to now, went from 9.3 to 7.7.  MD notified.  Waiting on response.

## 2017-09-11 NOTE — Progress Notes (Signed)
Per pt and wife, new urinary catheter put in place on 08/31/17 by PA.

## 2017-09-11 NOTE — Care Management Important Message (Signed)
Important Message  Patient Details  Name: Joseph Ritter MRN: [id number] Date of Birth: [birth year]   Medicare Important Message Given:  Yes    Shelbie Ammons, RN 09/11/2017, 8:15 AM

## 2017-09-11 NOTE — Progress Notes (Signed)
Pharmacy Antibiotic Note  Joseph Ritter is a 76 y.o. male admitted on 09/10/2017 with UTI.  Pharmacy has been consulted for ceftriaxone dosing.  Plan: Will start patient on ceftriaxone 1g IV daily for 5 days for UTI treatment  Height: 5\' 7"  (170.2 cm) Weight: 142 lb (64.4 kg) IBW/kg (Calculated) : 66.1  Temp (24hrs), Avg:97.5 F (36.4 C), Min:95.8 F (35.4 C), Max:98.4 F (36.9 C)  Recent Labs  Lab 09/10/17 1343 09/10/17 1724  WBC 9.1 9.6  CREATININE 1.00 1.28*  LATICACIDVEN  --  1.2    Estimated Creatinine Clearance: 46.1 mL/min (A) (by C-G formula based on SCr of 1.28 mg/dL (H)).    No Known Allergies   Thank you for allowing pharmacy to be a part of this patient's care.  Tobie Lords, PharmD, BCPS Clinical Pharmacist 09/11/2017

## 2017-09-11 NOTE — Progress Notes (Signed)
Initial Nutrition Assessment  DOCUMENTATION CODES:   Non-severe (moderate) malnutrition in context of chronic illness  INTERVENTION:  Recommend liberalizing diet to regular.  Provide Ensure Enlive po BID, each supplement provides 350 kcal and 20 grams of protein.   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   Met with patient at bedside. He is known to this RD from previous admission. He reports he finished his XRT, but his chemotherapy was stopped early. He has had one dose of his immunotherapy. He reports his appetite is variable. For the past few days he has been eating well, but prior to that he was only drinking his Ensure. He occasionally has post-prandial N/V but reports he is not having any at this time.   UBW 184 lbs. Could not find a weight that high in chart. He was 158.5 lbs on 06/01/2017. He lost 13.5 lbs (8.5% body weight) over one month earlier this year, which is significant for time frame, and has remained weight stable since.  Medications reviewed and include: Eliquis, levothyroxine, pantoprazole, prednisone 60 mg daily, sertraline, ceftriaxone.  Labs reviewed: Sodium 131, CO2 21.  NUTRITION - FOCUSED PHYSICAL EXAM:    Most Recent Value  Orbital Region  Moderate depletion  Upper Arm Region  Severe depletion  Thoracic and Lumbar Region  Moderate depletion  Buccal Region  Moderate  depletion  Temple Region  Moderate depletion  Clavicle Bone Region  Moderate depletion  Clavicle and Acromion Bone Region  Moderate depletion  Scapular Bone Region  Moderate depletion  Dorsal Hand  Mild depletion  Patellar Region  Moderate depletion  Anterior Thigh Region  Moderate depletion  Posterior Calf Region  Mild depletion  Edema (RD Assessment)  None  Hair  Reviewed  Eyes  Reviewed  Mouth  Reviewed  Skin  Reviewed  Nails  Reviewed     Diet Order:  Diet Heart Room service appropriate? Yes; Fluid consistency: Thin  EDUCATION NEEDS:   No education needs have been identified at this time  Skin:  Skin Assessment: Reviewed RN Assessment  Last BM:  PTA (09/08/2017 per chart)  Height:   Ht Readings from Last 1 Encounters:  09/11/17 5' 7"  (1.702 m)    Weight:   Wt Readings from Last 1 Encounters:  09/11/17 144 lb (65.3 kg)    Ideal Body Weight:  67.3 kg  BMI:  Body mass index is 22.55 kg/m.  Estimated Nutritional Needs:   Kcal:  5374-8270 (MSJ x 1.3-1.5)  Protein:  85-100 grams (1.3-1.5 grams/kg)  Fluid:  1.6-2 L/day (25-30 mL/kg)  Willey Blade, MS, RD, LDN Office: [phone] Pager: [phone] After Hours/Weekend Pager: [phone]

## 2017-09-12 DIAGNOSIS — E440 Moderate protein-calorie malnutrition: Secondary | ICD-10-CM

## 2017-09-12 LAB — COMPREHENSIVE METABOLIC PANEL
ALT: 165 U/L — AB (ref 17–63)
AST: 58 U/L — AB (ref 15–41)
Albumin: 2.2 g/dL — ABNORMAL LOW (ref 3.5–5.0)
Alkaline Phosphatase: 199 U/L — ABNORMAL HIGH (ref 38–126)
Anion gap: 10 (ref 5–15)
BUN: 18 mg/dL (ref 6–20)
CHLORIDE: 102 mmol/L (ref 101–111)
CO2: 23 mmol/L (ref 22–32)
CREATININE: 0.99 mg/dL (ref 0.61–1.24)
Calcium: 7.9 mg/dL — ABNORMAL LOW (ref 8.9–10.3)
Glucose, Bld: 151 mg/dL — ABNORMAL HIGH (ref 65–99)
POTASSIUM: 4.4 mmol/L (ref 3.5–5.1)
SODIUM: 135 mmol/L (ref 135–145)
Total Bilirubin: 0.4 mg/dL (ref 0.3–1.2)
Total Protein: 6.1 g/dL — ABNORMAL LOW (ref 6.5–8.1)

## 2017-09-12 MED ORDER — CEFUROXIME AXETIL 250 MG PO TABS: 250.0000 mg | ORAL_TABLET | Freq: Two times a day (BID) | ORAL | 0 refills | Status: AC

## 2017-09-12 MED ORDER — HEPARIN SOD (PORK) LOCK FLUSH 100 UNIT/ML IV SOLN
500.0000 [IU] | Freq: Once | INTRAVENOUS | Status: DC
  Filled 2017-09-12: qty 5

## 2017-09-12 NOTE — Clinical Social Work Note (Signed)
CSW received referral for SNF.  Case discussed with case manager and plan is to discharge home with home health.  CSW to sign off please re-consult if social work needs arise.  Corynne Scibilia R. Taylore Hinde, MSW, LCSWA [phone]  

## 2017-09-12 NOTE — Progress Notes (Signed)
Discharge instructions along with home medications and follow up gone over with patient and wife. Both verbalize that they understood instructions. 1 prescription given to patient. Port deaccessed. Pt being discharged home on room air, no distress noted. Ammie Dalton, RN

## 2017-09-12 NOTE — Evaluation (Signed)
Physical Therapy Evaluation Patient Details Name: Joseph Ritter MRN: [id number] DOB: [birth year] Today's Date: 09/12/2017   History of Present Illness  Pt is a 76 y.o. male presenting to hospital with hypotension, UTI, and abnormal LFT's.  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  Clinical Impression  Prior to hospital admission, pt was modified independent ambulating short distances with walker (limited distance d/t LBP and weakness).  Pt lives with his wife in 1 level home with a couple small steps to enter.  Currently pt is modified independent with bed mobility; CGA with transfers; and CGA ambulating 120 feet with RW.  Overall pt demonstrating generalized weakness and decreased balance and activity tolerance compared to baseline.  Pt would benefit from skilled PT to address noted impairments and functional limitations (see below for any additional details).  Upon hospital discharge, recommend pt discharge to home with HHPT and SBA for functional mobility for safety (pt's wife reports she has been doing this since Monday and feels comfortable to continue doing this).    Follow Up Recommendations Home health PT;Supervision for mobility/OOB    Equipment Recommendations  Rolling walker with 5" wheels    Recommendations for Other Services       Precautions / Restrictions Precautions Precautions: Fall Precaution Comments: R chest port; chronic indwelling foley; metastatic disease Restrictions Weight Bearing Restrictions: No      Mobility  Bed Mobility Overal bed mobility: Modified Independent             General bed mobility comments: Supine to/from sit with mild increased effort; performed via logrolling d/t back pain  Transfers Overall transfer level: Needs assistance Equipment used: Rolling walker (2 wheeled) Transfers: Sit to/from [employer] Sit to Stand: Min guard Stand pivot transfers: Min guard(toilet transfer)       General transfer comment: mild increased effort to stand but steady with RW  Ambulation/Gait Ambulation/Gait assistance: Min guard Ambulation Distance (Feet): 120 Feet Assistive device: Rolling walker (2 wheeled)   Gait velocity: decreased   General Gait Details: decreased B step length; mild increased trunk flexion with distance (d/t back pain)  Stairs Stairs: (pt declined stairs (pt and pt's wife reported no concerns with navigating small steps at home))          Wheelchair Mobility    Modified Rankin (Stroke Patients Only)       Balance Overall balance assessment: Needs assistance Sitting-balance support: No upper extremity supported;Feet supported Sitting balance-Leahy Scale: Good Sitting balance - Comments: steady reaching within BOS   Standing balance support: No upper extremity supported Standing balance-Leahy Scale: Good Standing balance comment: mildly shaky but no loss of balance standing washing hands at sink                             Pertinent Vitals/Pain Pain Assessment: 0-10 Pain Score: 5  Pain Location: low back pain with ambulation Pain Descriptors / Indicators: Tightness Pain Intervention(s): Limited activity within patient's tolerance;Monitored during session;Repositioned;Patient requesting pain meds-RN notified  Vitals (HR and O2 on room air) stable and WFL throughout treatment session.    Home Living Family/patient expects to be discharged to:: Private residence Living Arrangements: Spouse/significant other Available Help at Discharge: Family Type of Home: House Home Access: Stairs to enter Entrance Stairs-Rails: None Entrance Stairs-Number of Steps: 1 small step and then 1 more small step Home Layout: One level  Home Equipment: [place] - 4 wheels;Cane - single point;Walker - 2 wheels;Hospital bed;Bedside commode      Prior Function  Level of Independence: Independent with assistive device(s)         Comments: Pt rarely out of the house.  Walks short distances with RW (limited d/t pain and weakness)     Hand Dominance        Extremity/Trunk Assessment   Upper Extremity Assessment Upper Extremity Assessment: Generalized weakness    Lower Extremity Assessment Lower Extremity Assessment: Generalized weakness    Cervical / Trunk Assessment Cervical / Trunk Assessment: Normal  Communication   Communication: No difficulties  Cognition Arousal/Alertness: Awake/alert Behavior During Therapy: WFL for tasks assessed/performed Overall Cognitive Status: Within Functional Limits for tasks assessed                                        General Comments General comments (skin integrity, edema, etc.): Foley catheter leg bag in place (NT emptied prior to ambulation).  Nursing cleared pt for participation in physical therapy.  Pt agreeable to PT session.  Pt's wife present during session.    Exercises     Assessment/Plan    PT Assessment Patient needs continued PT services  PT Problem List Decreased strength;Decreased activity tolerance;Decreased balance;Decreased mobility;Pain       PT Treatment Interventions DME instruction;Gait training;Stair training;Functional mobility training;Therapeutic activities;Therapeutic exercise;Balance training;Patient/family education    PT Goals (Current goals can be found in the Care Plan section)  Acute Rehab PT Goals Patient Stated Goal: to go home PT Goal Formulation: With patient/family Time For Goal Achievement: 09/26/17 Potential to Achieve Goals: Good    Frequency Min 2X/week   Barriers to discharge        Co-evaluation               AM-PAC PT "6 Clicks" Daily Activity  Outcome Measure Difficulty turning over in bed (including adjusting bedclothes, sheets and blankets)?: A Little Difficulty moving from lying on back to sitting on the  side of the bed? : A Little Difficulty sitting down on and standing up from a chair with arms (e.g., wheelchair, bedside commode, etc,.)?: A Little Help needed moving to and from a bed to chair (including a wheelchair)?: A Little Help needed walking in hospital room?: A Little Help needed climbing 3-5 steps with a railing? : A Little 6 Click Score: 18    End of Session Equipment Utilized During Treatment: Gait belt Activity Tolerance: Patient limited by fatigue;Patient limited by pain Patient left: in bed;with call bell/phone within reach;with bed alarm set;with family/visitor present Nurse Communication: Mobility status;Patient requests pain meds;Precautions PT Visit Diagnosis: Other abnormalities of gait and mobility (R26.89);Muscle weakness (generalized) (M62.81)    Time: 4034-7425 PT Time Calculation (min) (ACUTE ONLY): 30 min   Charges:   PT Evaluation $PT Eval Low Complexity: 1 Low PT Treatments $Therapeutic Activity: 8-22 mins   PT G Codes:   PT G-Codes **NOT FOR INPATIENT CLASS** Functional Assessment Tool Used: AM-PAC 6 Clicks Basic Mobility Functional Limitation: Mobility: Walking and moving around Mobility: Walking and Moving Around Current Status (Z5638): At least 40 percent but less than 60 percent impaired, limited or restricted Mobility: Walking and Moving Around Goal Status [phone]): 0 percent impaired, limited or restricted     Leitha Bleak, PT 09/12/17, 12:58 PM [phone]

## 2017-09-12 NOTE — Care Management Note (Signed)
Case Management Note  Patient Details  Name: Joseph Ritter MRN: [id number] Date of Birth: 1940/11/15  Subjective/Objective:   PT recommending home health PT. Spoke with patient wife. She states they have used [facility] in the past and would like to have them again. Referral to [facility] for HHPT. Wife states he has a walker.                  Action/Plan: [facility] for HHPT.  Expected Discharge Date:  09/12/17               Expected Discharge Plan:  [place]  In-House Referral:     Discharge planning Services  CM Consult  Post Acute Care Choice:  Home Health Choice offered to:  Spouse  DME Arranged:    DME Agency:     HH Arranged:  PT [place]:  [place]  Status of Service:  Completed, signed off  If discussed at [place] of Stay Meetings, dates discussed:    Additional Comments:  Jolly Mango, RN 09/12/2017, 1:57 PM

## 2017-09-12 NOTE — Discharge Summary (Signed)
[facility] NAME: Joffrey Kerce    MR#:  [id number]  DATE OF BIRTH:  1941-09-23  DATE OF ADMISSION:  09/10/2017 ADMITTING PHYSICIAN: Lance Coon, MD  DATE OF DISCHARGE: 09/12/2017  PRIMARY CARE PHYSICIAN: [name], MD    ADMISSION DIAGNOSIS:  Hypotension, unspecified hypotension type [I95.9] Urinary tract infection without hematuria, site unspecified [N39.0]  DISCHARGE DIAGNOSIS:  Principal Problem:   UTI (urinary tract infection) Active Problems:   Arteriosclerosis of coronary artery   HLD (hyperlipidemia)   BP (high blood pressure)   Adult hypothyroidism   CKD (chronic kidney disease) stage 3, GFR 30-59 ml/min (HCC)   Malnutrition of moderate degree   SECONDARY DIAGNOSIS:   Past Medical History:  Diagnosis Date  . Acid indigestion 03/09/2014  . Adult hypothyroidism 11/18/2012  . Anxiety   . Arteriosclerosis of coronary artery 11/18/2012   Overview:  PATENT LIMA TO LAD, PATENT SVG TO PDA AND OCCULUDED SVG TO OM2 BY CATHERIZATION 02/09/2009   . 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 Borderline diabetes 05/25/2014  . BP (high blood pressure) 11/18/2012  . BPH (benign prostatic hypertrophy)   . CAFL (chronic airflow limitation) ([place]) 05/03/2015  . Cancer ([place])    renal cancer with mets  . CCF (congestive cardiac failure) ([place]) 01/03/2014   Overview:  HX OF   . COPD (chronic obstructive pulmonary disease) ([place])   . DDD (degenerative disc disease), lumbar 06/29/2014  . 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 Gastric ulcer 03/09/2014  . 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 History of urinary self-catheterization 2017  . HLD (hyperlipidemia) 01/03/2014  . Incomplete bladder emptying 11/18/2012  . Leg weakness 11/18/2012  . Lumbar radiculitis   . Neuritis or radiculitis due to  rupture of lumbar intervertebral disc 06/29/2014  . Overactive bladder   . PONV (postoperative nausea and vomiting)    years ago with Ether, no problem with Nausea or vomiting with the last few surgerys  . Pre-diabetes   . 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  1. Sepsis-patient presented to the hospital from the oncologists office due to hypotension, tachycardia, and urinalysis suggestive of UTI.  -Patient received IV fluids, IV ceftriaxone for the UTI. He has not been afebrile and hemodynamically stable over the past 24 hours. His blood cultures are negative, his urine cultures were never sent. Patient is empirically being discharged on 3 more days of oral Ceftin.  2. Urinary tract infection- Patient has a chronic indwelling Foley and is likely colonized. He recently had a urinary tract infection with staph aureus and Klebsiella about a month ago.  Urine cultures during this hospitalization were never sent. His blood cultures are negative. Previously patient's UTI was secondary to staph aureus and Klebsiella on both of which were sensitive to ceftriaxone and patient was treated with death and now being discharged on oral Ceftin.  3. Abnormal LFTs-secondary to immunotherapy he is getting for his malignancy. -Patient is  being followed by oncology and LFTs to be followed as an outpatient. Patient was started on high-dose prednisone by his oncologist which she will continue until he follows up with her in the next week.  4. History of transitional cell carcinoma of the bladder/kidney-this is metastatic to  the bone. -Patient has chronic pain due to malignancy. He will continue his MS Contin, lidocaine patch. He'll follow-up with his oncologist next week.  5. Essential hypertension- he will continue ramipril.  6. GERD- he will continue his aciphex  7. Hypothyroidism- he will continue Synthroid.  8. Anxiety/depression- he will continue Zoloft, Ativan as needed.  9. Hyperlipidemia- he will continue simvastatin, Zetia  Patient was seen by physical therapy and recommended home with home health. Patient is being discharged with home health physical therapy.  DISCHARGE CONDITIONS:   Stable  CONSULTS OBTAINED:    DRUG ALLERGIES:  No Known Allergies  DISCHARGE MEDICATIONS:   Allergies as of 09/12/2017   No Known Allergies     Medication List    STOP taking these medications   nitrofurantoin (macrocrystal-monohydrate) 100 MG capsule Commonly known as:  MACROBID     TAKE these medications   buPROPion 100 MG 12 hr tablet Commonly known as:  WELLBUTRIN SR TAKE ONE TABLET BY MOUTH EVERY DAY   cefUROXime 250 MG tablet Commonly known as:  CEFTIN Take 1 tablet (250 mg total) by mouth 2 (two) times daily with a meal for 3 days.   cyclobenzaprine 10 MG tablet Commonly known as:  FLEXERIL Take 10 mg by mouth at bedtime as needed for muscle spasms.   ELIQUIS 5 MG Tabs tablet Generic drug:  apixaban TAKE ONE TABLET BY MOUTH TWICE DAILY   ezetimibe-simvastatin 10-40 MG tablet Commonly known as:  VYTORIN Take 1 tablet by mouth at bedtime.   feeding supplement (ENSURE ENLIVE) Liqd Take 237 mLs by mouth 2 (two) times daily between meals.   HYDROcodone-acetaminophen 7.5-325 MG tablet Commonly known as:  NORCO Take 1 tablet by mouth every 4 (four) hours as needed for moderate pain.   hyoscyamine 0.125 MG Tbdp disintergrating tablet Commonly known as:  ANASPAZ Place 0.125 mg under the tongue every 6 (six) hours as needed (for abdominal cramping due to IBS).   ketoconazole  2 % shampoo Commonly known as:  NIZORAL APPLY SHAMPOO TO HAIR AS NEEDED FOR FLAKY/ITCHY SCALP (TYPICALLY EVERY 2-3 DAYS)   levothyroxine 125 MCG tablet Commonly known as:  SYNTHROID, LEVOTHROID TAKE ONE TABLET BY MOUTH EVERY DAY BEFORE BREAKFAST   lidocaine 5 % Commonly known as:  LIDODERM Place 1 patch onto the skin every 12 (twelve) hours. Remove & Discard patch within 12 hours or as directed by MD   LORazepam 0.5 MG tablet Commonly known as:  ATIVAN Take 1 tablet (0.5 mg total) by mouth every 8 (eight) hours as needed for anxiety (nausea).   morphine 30 MG 12 hr tablet Commonly known as:  MS CONTIN Take 2 tablets (60 mg total) by mouth every 12 (twelve) hours.   morphine 15 MG tablet Commonly known as:  MSIR Take 1 tablet (15 mg total) by mouth every 6 (six) hours as needed for moderate pain or severe pain.   multivitamin with minerals Tabs tablet Take 1 tablet by mouth daily after breakfast. CENTRUM SILVER   nystatin 100000 UNIT/ML suspension Commonly known as:  MYCOSTATIN   PEPPERMINT OIL PO Take 1 capsule by mouth 3 (three) times daily as needed (for IBS). IBgard (active ingredient: 90 mg ultrapurified peppermint oil)  polyethylene glycol powder powder Commonly known as:  GLYCOLAX/MIRALAX Take 17 g by mouth daily after breakfast. Mix with glass of water   predniSONE 10 MG tablet Commonly known as:  DELTASONE Take 6 tablets (60 mg total) by mouth daily with breakfast. What changed:    how much to take  how to take this  when to take this   promethazine 25 MG tablet Commonly known as:  PHENERGAN Take 1 tablet (25 mg total) by mouth every 6 (six) hours as needed for nausea or vomiting.   promethazine 25 MG suppository Commonly known as:  PHENERGAN Place 1 suppository (25 mg total) rectally every 6 (six) hours as needed for nausea.   RABEprazole 20 MG tablet Commonly known as:  ACIPHEX Take 20 mg by mouth daily before breakfast.   ramipril 10 MG  capsule Commonly known as:  ALTACE Take 10 mg by mouth daily after breakfast.   SENNA LAX 8.6 MG tablet Generic drug:  senna TAKE TWO TABLETS BY MOUTH DAILY   sertraline 100 MG tablet Commonly known as:  ZOLOFT Take 100 mg by mouth daily after breakfast.   sodium phosphate 7-19 GM/118ML Enem Place 133 mLs (1 enema total) rectally daily as needed for severe constipation.   tamsulosin 0.4 MG Caps capsule Commonly known as:  FLOMAX Take 0.8 mg by mouth daily after supper.         DISCHARGE INSTRUCTIONS:   DIET:  Cardiac diet  DISCHARGE CONDITION:  Stable  ACTIVITY:  Activity as tolerated  OXYGEN:  Home Oxygen: No.   Oxygen Delivery: room air  DISCHARGE LOCATION:  Home with home health physical therapy   If you experience worsening of your admission symptoms, develop shortness of breath, life threatening emergency, suicidal or homicidal thoughts you must seek medical attention immediately by calling 911 or calling your MD immediately  if symptoms less severe.  You Must read complete instructions/literature along with all the possible adverse reactions/side effects for all the Medicines you take and that have been prescribed to you. Take any new Medicines after you have completely understood and accpet all the possible adverse reactions/side effects.   Please note  You were cared for by a hospitalist during your hospital stay. If you have any questions about your discharge medications or the care you received while you were in the hospital after you are discharged, you can call the unit and asked to speak with the hospitalist on call if the hospitalist that took care of you is not available. Once you are discharged, your primary care physician will handle any further medical issues. Please note that NO REFILLS for any discharge medications will be authorized once you are discharged, as it is imperative that you return to your primary care physician (or establish a  relationship with a primary care physician if you do not have one) for your aftercare needs so that they can reassess your need for medications and monitor your lab values.     Today   Still having some back pain.  No fever overnight.  BP stable. Cultures so far (-).  LFT's trending down.   VITAL SIGNS:  Blood pressure (!) 139/54, pulse 79, temperature 98.9 F (37.2 C), temperature source Oral, resp. rate 18, height 5\' 7"  (1.702 m), weight 65.3 kg (144 lb), SpO2 94 %.  I/O:    Intake/Output Summary (Last 24 hours) at 09/12/2017 1341 Last data filed at 09/12/2017 1145 Gross per 24 hour  Intake 600 ml  Output 1125 ml  Net -525 ml    PHYSICAL EXAMINATION:    GENERAL:  76 y.o.-year-old patient lying in bed in no acute distress.  EYES: Pupils equal, round, reactive to light and accommodation. No scleral icterus. Extraocular muscles intact.  HEENT: Head atraumatic, normocephalic. Oropharynx and nasopharynx clear.  NECK:  Supple, no jugular venous distention. No thyroid enlargement, no tenderness.  LUNGS: Normal breath sounds bilaterally, no wheezing, rales, rhonchi. No use of accessory muscles of respiration.  CARDIOVASCULAR: S1, S2 normal. No murmurs, rubs, or gallops. Right chest wall port-a-cath in place.  ABDOMEN: Soft, nontender, nondistended. Bowel sounds present. No organomegaly or mass.  EXTREMITIES: No cyanosis, clubbing or edema b/l.    NEUROLOGIC: Cranial nerves II through XII are intact. No focal Motor or sensory deficits b/l.   PSYCHIATRIC: The patient is alert and oriented x 3.  SKIN: No obvious rash, lesion, or ulcer.   DATA REVIEW:   CBC Recent Labs  Lab 09/11/17 0420  WBC 6.5  HGB 7.7*  HCT 23.7*  PLT 198    Chemistries  Recent Labs  Lab 09/12/17 0431  NA 135  K 4.4  CL 102  CO2 23  GLUCOSE 151*  BUN 18  CREATININE 0.99  CALCIUM 7.9*  AST 58*  ALT 165*  ALKPHOS 199*  BILITOT 0.4    Cardiac Enzymes Recent Labs  Lab 09/10/17 1819   TROPONINI <0.03    Microbiology Results  Results for orders placed or performed during the hospital encounter of 09/10/17  Blood Culture (routine x 2)     Status: None (Preliminary result)   Collection Time: 09/10/17  6:19 PM  Result Value Ref Range Status   Specimen Description BLOOD RIGHT HAND  Final   Special Requests   Final    BOTTLES DRAWN AEROBIC AND ANAEROBIC Blood Culture adequate volume   Culture NO GROWTH 2 DAYS  Final   Report Status PENDING  Incomplete  Blood Culture (routine x 2)     Status: None (Preliminary result)   Collection Time: 09/10/17  6:22 PM  Result Value Ref Range Status   Specimen Description BLOOD RIGHT AC  Final   Special Requests   Final    BOTTLES DRAWN AEROBIC AND ANAEROBIC Blood Culture adequate volume   Culture NO GROWTH 2 DAYS  Final   Report Status PENDING  Incomplete    RADIOLOGY:  Dg Chest Port 1 View  Result Date: 09/10/2017 CLINICAL DATA:  Low blood pressure today right renal cancer with osseous metastasis. EXAM: PORTABLE CHEST 1 VIEW COMPARISON:  Chest x-ray dated 07/06/2017. FINDINGS: Heart size and mediastinal contours are within normal limits, stable. Right chest wall Port-A-Cath appears adequately positioned with tip at the level of the mid SVC. Median sternotomy wires appear intact and stable alignment, with associated surgical changes of CABG. Lungs are clear. No pleural effusion or pneumothorax seen. Osseous structures about the chest are unremarkable. IMPRESSION: No active disease.  No evidence of pneumonia or pulmonary edema. Electronically Signed   By: Franki Cabot M.D.   On: 09/10/2017 18:38      Management plans discussed with the patient, family and they are in agreement.  CODE STATUS:     Code Status Orders  (From admission, onward)        Start     Ordered   09/11/17 0053  Do not attempt resuscitation (DNR)  Continuous    Question Answer Comment  In the event of cardiac or respiratory ARREST Do not call a "code  blue"  In the event of cardiac or respiratory ARREST Do not perform Intubation, CPR, defibrillation or ACLS   In the event of cardiac or respiratory ARREST Use medication by any route, position, wound care, and other measures to relive pain and suffering. May use oxygen, suction and manual treatment of airway obstruction as needed for comfort.   Comments nurse may pronounce      09/11/17 0052  Advance Directive Documentation     Most Recent Value  Type of Advance Directive  Healthcare Power of Attorney, Living will  Pre-existing out of facility DNR order (yellow form or pink MOST form)  No data  "MOST" Form in Place?  No data      TOTAL TIME TAKING CARE OF THIS PATIENT: 40 minutes.    Henreitta Leber M.D on 09/12/2017 at 1:40 PM  Between 7am to 6pm - Pager - [phone]  After 6pm go to [email or web address] - Proofreader  [facility]  Office  [phone]  CC: Primary care physician; [name], MD

## 2017-09-15 LAB — CULTURE, BLOOD (ROUTINE X 2)
CULTURE: NO GROWTH
Culture: NO GROWTH
SPECIAL REQUESTS: ADEQUATE
SPECIAL REQUESTS: ADEQUATE

## 2017-09-25 NOTE — Progress Notes (Signed)
[facility] Follow Up visit  Patient Care Team: [name], MD as PCP - General (Physician Assistant)  REASON FOR VISIT Treatment of Locally advanced kidney urothelial cancer  ONCOLOGY HISTORY Joseph Ritter 77 y.o. male with PMH listed as below  is referred to [place] for evaluation of locally advanced kidney urothelia cancer. Patient developed gross hematuria at the end of 2017. CT 08/2016 showed endophytic hypodense mass. Repeat image 01/2017 showed enlarged  endophytic hypodense mass right kidney. 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 Right robotic nephroureterectomy was attempted but aborted due to unresectable newly metastatic disease diagnosed intraoperatively. Postop he underwent a CT scan of the abdomen and pelvis which showed extension of an infiltrative mass from the right kidney along the right renal vein toward the IVC with encasement of the IVC.  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 He self catheterize a few time a day.  He reports having back pain which limits her ability to walk. Also constipated and abdominal bloating. His appetite is fair. He takes Norco for abdominal and back pain.  He can walk with walker for very short distance, mostly limited by back pain and fatigue./weakness.  # Images: CT chest and abdomen 05/03/2017 1. Pneumoperitoneum with a small amount of ascites, gas tracking along the thoracoabdominal wall into the scrotum, and a small amount of gas in the right perirenal space-these findings are likely related to the patient's attempted nephroureterectomy yesterday. Surveillance to ensure expected resolution of the pneumoperitoneum might be appropriate. 2. The right kidney lower pole infarct with lack of patency of the more inferior of the 3 right renal arteries. There is delayed nephrogram on the right with increase an infiltrative process in theright  kidney and extending into the right renal hilar vascular structures, including the right renal vein and the adjacent IVC, compatible with tumor thrombus. There is also soft tissue density surrounding the renal hilar vascular structures and extending into the retroperitoneum and periaortic region, some of which may be tumor and some of which may be hematoma.3. Demineralization of the anterior inferior L2 vertebral body,concerning for tumor invasion. This is near the level of the retroaortic left renal vein. 4. No findings of metastatic disease to the chest. There are new trace bilateral pleural effusions with mild passive atelectasis.  #Pathology: 05/02/2017 SPECIMEN SUBMITTED: A. Hilar tissue, right kidney B. Hilar tissue, right kidney DIAGNOSIS:  A and B. SOFT TISSUE, RIGHT RENAL HILUM  PD-L1 - Urothelial (TECENTRIQ) Immunohistochemistry Analysis  Result: 0% Interpretation: No expression   # Current Treatment: 1 Palliative RT to spine 2 Chemotherapy:     06/01/2017 Cycle 1 Gemcitabine and Carboplain.  Day 8 Gemcitabine was delayed due to intractable nausea and vomiting.     Chemotherapy related thrombocytopenia, no active bleeding. 50% dose of Gemcitabine was given on day 11.     06/25/2017 Cycle 2 Day 1 Gemcitabine and Carboplain.    07/03/2017 Cycle 2 Day 8 chemotherapy discontinued.      3 Immunotherapy:     07/27/2017 cycle 1 of Atezolizumab, 11/26 2018 cycle 2 Atezolizumab was held due to hepatic toxicity.        INTERVAL HISTORY 77 yo male who has above oncology history reviewed by me today presents for evaluation for liver toxicity secondary to immunotherapy. He was started on prednisone 71m daily however since he ran out last week, his wife continued him on 113mprednisone. Today patient feels  week, BP was 74/49, tachycardic with HR 116. Denies any nausea or vomiting, diarrhea.  Pain is well controlled with current pain regimen. Denies fever or chills. Reports gradual onset lower  abdominal discomfort.   Review of Systems  Constitutional: Positive for fatigue. Negative for chills, diaphoresis and fever.  HENT:   Negative for lump/mass, nosebleeds and sore throat.   Eyes: Negative for eye problems and icterus.  Respiratory: Negative for chest tightness, cough and shortness of breath.   Cardiovascular: Negative for chest pain, leg swelling and palpitations.  Gastrointestinal: Positive for nausea and vomiting. Negative for abdominal pain and blood in stool.  Endocrine: Negative for hot flashes.  Genitourinary: Negative for bladder incontinence, difficulty urinating, dysuria and hematuria.   Musculoskeletal: Positive for back pain. Negative for flank pain, gait problem and myalgias.  Skin: Negative for itching.  Neurological: Negative for dizziness, gait problem and headaches.  Hematological: Negative for adenopathy.  Psychiatric/Behavioral: Negative for confusion and decreased concentration. The patient is not nervous/anxious.      MEDICAL HISTORY: Past Medical History:  Diagnosis Date  . Acid indigestion 03/09/2014  . Adult hypothyroidism 11/18/2012  . Anxiety   . Arteriosclerosis of coronary artery 11/18/2012   Overview:  PATENT LIMA TO LAD, PATENT SVG TO PDA AND OCCULUDED SVG TO OM2 BY CATHERIZATION 02/09/2009   . 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 Borderline diabetes 05/25/2014  . BP (high blood pressure) 11/18/2012  . BPH (benign prostatic hypertrophy)   . CAFL (chronic airflow limitation) ([place]) 05/03/2015  . Cancer ([place])    renal cancer with mets  . CCF (congestive cardiac failure) ([place]) 01/03/2014   Overview:  HX OF   . COPD (chronic obstructive pulmonary disease) ([place])   . DDD (degenerative disc disease), lumbar 06/29/2014  . 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 Gastric ulcer 03/09/2014  . 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 History of urinary self-catheterization 2017  . HLD (hyperlipidemia) 01/03/2014  . Incomplete bladder emptying 11/18/2012  . Leg weakness 11/18/2012  . Lumbar radiculitis   . Neuritis or radiculitis due to rupture of lumbar intervertebral disc 06/29/2014  . Overactive bladder   . PONV (postoperative nausea and vomiting)    years ago with Ether, no problem with Nausea or vomiting with the last few surgerys  . Pre-diabetes   . Subacute transverse myelitis ([place]) 11/21/2012  . Teeth problem    pt reports "bad teeth", "need to be pulled"    SURGICAL HISTORY: Past Surgical History:  Procedure Laterality Date  . ARTERY BIOPSY Right 12/16/2015   Procedure: BIOPSY TEMPORAL ARTERY;  Surgeon: Algernon Huxley, MD;  Location: [facility] ORS;  Service: Vascular;  Laterality: Right;  . CARDIAC CATHETERIZATION    . CATARACT EXTRACTION W/PHACO Left 01/26/2016   Procedure: CATARACT EXTRACTION PHACO AND INTRAOCULAR LENS PLACEMENT (IOC) LEFT EYE;  Surgeon: Leandrew Koyanagi, MD;  Location: [facility];  Service: Ophthalmology;  Laterality: Left;  . CORONARY ARTERY BYPASS GRAFT  04/07/2002   [facility]  . Cysto Bladder Botox Injection  07/02/2014  . CYSTOSCOPY W/ RETROGRADES Right 03/16/2017   Procedure: CYSTOSCOPY WITH RETROGRADE PYELOGRAM;  Surgeon: [name], MD;  Location: [facility];  Service: Urology;  Laterality: Right;  . CYSTOSCOPY WITH STENT PLACEMENT Right 03/16/2017   Procedure: CYSTOSCOPY WITH STENT PLACEMENT;  Surgeon: [name], MD;  Location: [facility]  ORS;  Service: Urology;  Laterality: Right;  . EYE SURGERY Left    blood clot behind left eye  . HAND SURGERY Bilateral   . HERNIA REPAIR Right    inguinal hernia repair  . LEFT HEART CATH AND CORS/GRAFTS ANGIOGRAPHY N/A 04/25/2017   Procedure: Left Heart Cath and Cors/Grafts Angiography;  Surgeon: Isaias Cowman, MD;  Location: [facility];  Service: Cardiovascular;  Laterality: N/A;  . PORTA CATH INSERTION N/A  05/30/2017   Procedure: Glori Luis Cath Insertion;  Surgeon: Katha Cabal, MD;  Location: [facility];  Service: Cardiovascular;  Laterality: N/A;  . ROBOT ASSITED LAPAROSCOPIC NEPHROURETERECTOMY Right 05/02/2017   Procedure: ROBOT ASSITED LAPAROSCOPIC NEPHROURETERECTOMY ATTEMPTED;  Surgeon: [name], MD;  Location: [facility];  Service: Urology;  Laterality: Right;  . URETEROSCOPY Right 03/16/2017   Procedure: URETEROSCOPY BIOPSY RENAL MASS;  Surgeon: [name], MD;  Location: [facility];  Service: Urology;  Laterality: Right;    SOCIAL HISTORY: Social History   Socioeconomic History  . Marital status: Married    Spouse name: Not on file  . Number of children: Not on file  . Years of education: Not on file  . Highest education level: Not on file  Social Needs  . Financial resource strain: Not on file  . Food insecurity - worry: Not on file  . Food insecurity - inability: Not on file  . Transportation needs - medical: Not on file  . Transportation needs - non-medical: Not on file  Occupational History  . Not on file  Tobacco Use  . Smoking status: Former Smoker    Types: Cigarettes    Last attempt to quit: 04/07/2002    Years since quitting: 15.4  . Smokeless tobacco: Former Systems developer    Types: Snuff, Chew  Substance and Sexual Activity  . Alcohol use: No    Alcohol/week: 0.0 oz  . Drug use: No  . Sexual activity: Not on file  Other Topics Concern  . Not on file  Social History Narrative  . Not on file    FAMILY HISTORY Family History  Problem Relation Age of Onset  . Heart attack Mother   . Heart disease Father   . Prostate cancer Neg Hx   . Bladder Cancer Neg Hx   . Kidney cancer Neg Hx     ALLERGIES:  has No Known Allergies.  MEDICATIONS:  Current Outpatient Medications  Medication Sig Dispense Refill  . buPROPion (WELLBUTRIN SR) 100 MG 12 hr tablet TAKE ONE TABLET BY MOUTH EVERY DAY    . cyclobenzaprine (FLEXERIL) 10 MG tablet Take 10 mg  by mouth at bedtime as needed for muscle spasms.    Marland Kitchen ELIQUIS 5 MG TABS tablet TAKE ONE TABLET BY MOUTH TWICE DAILY 60 tablet 0  . ezetimibe-simvastatin (VYTORIN) 10-40 MG tablet Take 1 tablet by mouth at bedtime.    . feeding supplement, ENSURE ENLIVE, (ENSURE ENLIVE) LIQD Take 237 mLs by mouth 2 (two) times daily between meals. 60 Bottle 0  . HYDROcodone-acetaminophen (NORCO) 7.5-325 MG tablet Take 1 tablet by mouth every 4 (four) hours as needed for moderate pain. (Patient not taking: Reported on 09/10/2017) 180 tablet 0  . 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    Marland Kitchen ketoconazole (NIZORAL) 2 % shampoo APPLY SHAMPOO TO HAIR AS NEEDED FOR FLAKY/ITCHY SCALP (TYPICALLY EVERY 2-3 DAYS)    .  levothyroxine (SYNTHROID, LEVOTHROID) 125 MCG tablet TAKE ONE TABLET BY MOUTH EVERY DAY BEFORE BREAKFAST 30 tablet 0  . lidocaine (LIDODERM) 5 % Place 1 patch onto the skin every 12 (twelve) hours. Remove & Discard patch within 12 hours or as directed by MD 60 patch 11  . LORazepam (ATIVAN) 0.5 MG tablet Take 1 tablet (0.5 mg total) by mouth every 8 (eight) hours as needed for anxiety (nausea). 30 tablet 0  . morphine (MS CONTIN) 30 MG 12 hr tablet Take 2 tablets (60 mg total) by mouth every 12 (twelve) hours. 120 tablet 0  . morphine (MSIR) 15 MG tablet Take 1 tablet (15 mg total) by mouth every 6 (six) hours as needed for moderate pain or severe pain. 120 tablet 0  . Multiple Vitamin (MULTIVITAMIN WITH MINERALS) TABS tablet Take 1 tablet by mouth daily after breakfast. CENTRUM SILVER    . nystatin (MYCOSTATIN) 100000 UNIT/ML suspension   2  . PEPPERMINT OIL PO Take 1 capsule by mouth 3 (three) times daily as needed (for IBS). IBgard (active ingredient: 90 mg ultrapurified peppermint oil)    . polyethylene glycol powder (GLYCOLAX/MIRALAX) powder Take 17 g by mouth daily after breakfast. Mix with  glass of water 255 g 1  . predniSONE (DELTASONE) 10 MG tablet Take 6 tablets (60 mg total) by mouth daily with breakfast. (Patient taking differently: 10 mg daily. ) 48 tablet 0  . promethazine (PHENERGAN) 25 MG suppository Place 1 suppository (25 mg total) rectally every 6 (six) hours as needed for nausea. 30 suppository 0  . promethazine (PHENERGAN) 25 MG tablet Take 1 tablet (25 mg total) by mouth every 6 (six) hours as needed for nausea or vomiting. 30 tablet 0  . RABEprazole (ACIPHEX) 20 MG tablet Take 20 mg by mouth daily before breakfast.     . ramipril (ALTACE) 10 MG capsule Take 10 mg by mouth daily after breakfast.    . SENNA LAX 8.6 MG tablet TAKE TWO TABLETS BY MOUTH DAILY 120 tablet 3  . sertraline (ZOLOFT) 100 MG tablet Take 100 mg by mouth daily after breakfast.    . sodium phosphate (FLEET) 7-19 GM/118ML ENEM Place 133 mLs (1 enema total) rectally daily as needed for severe constipation. 5 enema 0  . tamsulosin (FLOMAX) 0.4 MG CAPS capsule Take 0.8 mg by mouth daily after supper.      No current facility-administered medications for this visit.    Facility-Administered Medications Ordered in Other Visits  Medication Dose Route Frequency Provider Last Rate Last Dose  . heparin lock flush 100 unit/mL  500 Units Intravenous Once Earlie Server, MD      . sodium chloride flush (NS) 0.9 % injection 10 mL  10 mL Intravenous PRN Earlie Server, MD   10 mL at 07/03/17 0930    PHYSICAL EXAMINATION:  ECOG PERFORMANCE STATUS: 2 - Symptomatic, <50% confined to bed  Vitals:   09/26/17 1029  BP: (!) 74/49  Pulse: (!) 116  Resp: 18  Temp: (!) 96.4 F (35.8 C)    Filed Weights   09/26/17 1029  Weight: 134 lb (60.8 kg)     Physical Exam  Constitutional: He is oriented to person, place, and time. No distress.  HENT:  Head: Normocephalic and atraumatic.  Eyes: Conjunctivae and EOM are normal. Pupils are equal, round, and reactive to light. Left eye exhibits no discharge. No scleral  icterus.  Neck: Normal range of motion. Neck supple. No JVD present. No tracheal deviation present.  Cardiovascular:  Normal rate and regular rhythm.  No murmur heard. Pulmonary/Chest: No stridor. No respiratory distress. He has no wheezes. He has no rales.  Abdominal: He exhibits no distension. There is no tenderness. There is no rebound.  Genitourinary:  Genitourinary Comments: Foley catheter.   Musculoskeletal: Normal range of motion. He exhibits no edema.  Palpable spinal tenderness.  Lymphadenopathy:    He has no cervical adenopathy.  Neurological: He is alert and oriented to person, place, and time. No cranial nerve deficit. Coordination normal.  Skin: Skin is warm. No rash noted. He is not diaphoretic. No erythema.  Psychiatric: Affect normal.    ECOG 2  LABORATORY DATA: I have personally reviewed the data as listed: CBC    Component Value Date/Time   WBC 6.5 09/11/2017 0420   RBC 2.73 (L) 09/11/2017 0420   HGB 7.7 (L) 09/11/2017 0420   HGB 15.3 11/15/2012 1641   HCT 23.7 (L) 09/11/2017 0420   HCT 43.7 11/15/2012 1641   PLT 198 09/11/2017 0420   PLT 166 11/15/2012 1641   MCV 87.0 09/11/2017 0420   MCV 89 11/15/2012 1641   MCH 28.4 09/11/2017 0420   MCHC 32.6 09/11/2017 0420   RDW 19.2 (H) 09/11/2017 0420   RDW 13.3 11/15/2012 1641   LYMPHSABS 0.5 (L) 09/10/2017 1724   LYMPHSABS 1.7 11/15/2012 1641   MONOABS 0.7 09/10/2017 1724   MONOABS 0.8 11/15/2012 1641   EOSABS 0.2 09/10/2017 1724   EOSABS 0.1 11/15/2012 1641   BASOSABS 0.0 09/10/2017 1724   BASOSABS 0.0 11/15/2012 1641   CMP Latest Ref Rng & Units 09/12/2017 09/11/2017 09/10/2017  Glucose 65 - 99 mg/dL 151(H) 84 174(H)  BUN 6 - 20 mg/dL 18 18 18   Creatinine 0.61 - 1.24 mg/dL 0.99 1.19 1.28(H)  Sodium 135 - 145 mmol/L 135 131(L) 131(L)  Potassium 3.5 - 5.1 mmol/L 4.4 4.1 4.7  Chloride 101 - 111 mmol/L 102 103 98(L)  CO2 22 - 32 mmol/L 23 21(L) 21(L)  Calcium 8.9 - 10.3 mg/dL 7.9(L) 7.2(L) 7.8(L)  Total  Protein 6.5 - 8.1 g/dL 6.1(L) - 6.9  Total Bilirubin 0.3 - 1.2 mg/dL 0.4 - 0.6  Alkaline Phos 38 - 126 U/L 199(H) - 234(H)  AST 15 - 41 U/L 58(H) - 99(H)  ALT 17 - 63 U/L 165(H) - 262(H)     RADIOGRAPHIC STUDIES: I have personally reviewed the radiological images as listed and agree with the findings in the report 5.  Aortic Atherosclerosis (ICD10-I70.0).  Coronary atherosclerosis. 6. Inflammatory stranding from the right perirenal space extends down into the pelvis and crosses the midline. There is presacral edema. 7. 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 Recommend followup by ultrasound in 3 years. This recommendation follows ACR consensus guidelines: White Paper of the ACR Incidental Findings Committee II on Vascular Findings. J Am Coll Radiol 2013; 98:338-250 8. Wall thickening along the left side of the urinary bladder may be incidental, but synchronous sessile transitional cell carcinoma is not readily excluded.  CT abdomen wo contrast 02/09/2017  Increased size of 4.6 cm ill-defined hypoenhancing mass ininterpolar region of right kidney, with obstruction of upper pole collecting system. This does not have typical appearance for pyelonephritis or renal cell carcinoma, and raises suspicion for urothelial carcinoma. Consider ureteroscopy for further evaluation. No evidence of metastatic disease. Stable mildly enlarged prostate and mild diffuse bladder wall thickening. Stable 3.0 cm infrarenal abdominal aortic aneurysm. Recommend followup by ultrasound in 3 years. This recommendation follows ACR consensus guidelines: White Paper  of the ACR Incidental Findings Committee II on Vascular Findings. [place] [phone]. Colonic diverticulosis. No radiographic evidence of diverticulitis. Stable small fat-containing left inguinal hernia. MRI thoracic and lumbar w/wo contrast.  1. Malignant invasion of the L2 vertebral body by the adjacent  retroperitoneal mass. No pathologic fracture or osseous metastatic disease elsewhere in the thoracolumbar spine. 2. Areas of epidural contrast enhancement of the lumbar spine are most consistent with a dilated epidural venous plexus secondary to invasion of the inferior vena cava by the patient's urothelial tumor. 3. Moderate L4-L5 multifactorial spinal canal stenosis with severe right and mild-to-moderate left neural foraminal stenosis. 4. Moderate right and severe left multifactorial L5-S1 neural foraminal stenosis. There is also severe attenuation of the thecal sac at this level but no bony spinal canal stenosis. 5. No spinal canal or neural foraminal stenosis in the thoracic spine.   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 Transitional cell carcinoma of kidney, right ([place])   2. 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 Encounter for antineoplastic immunotherapy   4. Goals of care, counseling/discussion   5. Chemotherapy-induced thrombocytopenia    # Hold immunotherapy due to hepatotoxicity. He has only got one dose of Tecentriq and got liver toxicity.  # Discuss with patient that further immunotherapy will have to be held and prognosis is poor. Palliative care and hospice have been recommended to patient previously. Patient would like to have repeat CT abdomen pelvis to see if his disease has responded to immunotherapy or not and will take the information to consideration.  # Hyponatremia and Hypotension: likely adrenal insufficiency and poor oral intake. Will give him 1L NS IV fluid and measure his BP again.  Explained to patient and his wife that abruptly  stopping prednisone will cause adrenal insufficiency. Patient has his 47m tablets with him, and I advise him to take 547mprednisone today. I will refill his prednisone Rx and he can start on 4033maily until he sees me next week.  After 1L of normal saline, his BP improves to 96/50.  I have instructed patient to stop ramipril medication until further instructed by me.   # Grade 2 transaminitis, likely hepatoxicity from immunotherapy. Improving to grade 1 transaminitis on steroids. Continue tapering 43m78mednisone weekly.   # Current patient medication regimen is Morphin IR 15mg6m PRN for pain in combination with MS contin 60mg 45m Refilled 1 month supply. # Xgeva Q28 days due on 10/01/2017, will move to be done next week with MD visit.   #Hypothyroidism on Synthroid 125mcg 46my. Continue current dose.  Follow up in 1 week to repeat labs and further taper steroid course.   Chiana Wamser YuEarlie ServerhD Hematology Oncology [facility] [phone]

## 2017-09-26 ENCOUNTER — Other Ambulatory Visit: Payer: Self-pay

## 2017-09-26 ENCOUNTER — Inpatient Hospital Stay: Payer: Medicare Other

## 2017-09-26 ENCOUNTER — Inpatient Hospital Stay: Payer: Medicare Other | Attending: Oncology | Admitting: Oncology

## 2017-09-26 ENCOUNTER — Encounter: Payer: Self-pay | Admitting: Oncology

## 2017-09-26 VITALS — BP 74/49 | HR 116 | Temp 96.4°F | Resp 18 | Wt 134.0 lb

## 2017-09-26 VITALS — BP 98/58 | HR 90

## 2017-09-26 DIAGNOSIS — C7951 Secondary malignant neoplasm of bone: Secondary | ICD-10-CM | POA: Diagnosis not present

## 2017-09-26 DIAGNOSIS — E785 Hyperlipidemia, unspecified: Secondary | ICD-10-CM | POA: Diagnosis not present

## 2017-09-26 DIAGNOSIS — E039 Hypothyroidism, unspecified: Secondary | ICD-10-CM

## 2017-09-26 DIAGNOSIS — E860 Dehydration: Secondary | ICD-10-CM

## 2017-09-26 DIAGNOSIS — R531 Weakness: Secondary | ICD-10-CM | POA: Diagnosis not present

## 2017-09-26 DIAGNOSIS — D6959 Other secondary thrombocytopenia: Secondary | ICD-10-CM | POA: Diagnosis not present

## 2017-09-26 DIAGNOSIS — M5136 Other intervertebral disc degeneration, lumbar region: Secondary | ICD-10-CM

## 2017-09-26 DIAGNOSIS — J449 Chronic obstructive pulmonary disease, unspecified: Secondary | ICD-10-CM | POA: Diagnosis not present

## 2017-09-26 DIAGNOSIS — N319 Neuromuscular dysfunction of bladder, unspecified: Secondary | ICD-10-CM

## 2017-09-26 DIAGNOSIS — R7303 Prediabetes: Secondary | ICD-10-CM

## 2017-09-26 DIAGNOSIS — R740 Nonspecific elevation of levels of transaminase and lactic acid dehydrogenase [LDH]: Secondary | ICD-10-CM | POA: Diagnosis not present

## 2017-09-26 DIAGNOSIS — R5383 Other fatigue: Secondary | ICD-10-CM

## 2017-09-26 DIAGNOSIS — I2510 Atherosclerotic heart disease of native coronary artery without angina pectoris: Secondary | ICD-10-CM | POA: Diagnosis not present

## 2017-09-26 DIAGNOSIS — M549 Dorsalgia, unspecified: Secondary | ICD-10-CM

## 2017-09-26 DIAGNOSIS — Z79899 Other long term (current) drug therapy: Secondary | ICD-10-CM

## 2017-09-26 DIAGNOSIS — E871 Hypo-osmolality and hyponatremia: Secondary | ICD-10-CM

## 2017-09-26 DIAGNOSIS — C641 Malignant neoplasm of right kidney, except renal pelvis: Secondary | ICD-10-CM | POA: Diagnosis present

## 2017-09-26 DIAGNOSIS — Z906 Acquired absence of other parts of urinary tract: Secondary | ICD-10-CM | POA: Diagnosis not present

## 2017-09-26 DIAGNOSIS — Z905 Acquired absence of kidney: Secondary | ICD-10-CM | POA: Diagnosis not present

## 2017-09-26 DIAGNOSIS — N400 Enlarged prostate without lower urinary tract symptoms: Secondary | ICD-10-CM | POA: Diagnosis not present

## 2017-09-26 DIAGNOSIS — I959 Hypotension, unspecified: Secondary | ICD-10-CM

## 2017-09-26 DIAGNOSIS — R000 Tachycardia, unspecified: Secondary | ICD-10-CM

## 2017-09-26 DIAGNOSIS — Z9225 Personal history of immunosupression therapy: Secondary | ICD-10-CM | POA: Diagnosis not present

## 2017-09-26 DIAGNOSIS — C679 Malignant neoplasm of bladder, unspecified: Secondary | ICD-10-CM

## 2017-09-26 DIAGNOSIS — Z5112 Encounter for antineoplastic immunotherapy: Secondary | ICD-10-CM

## 2017-09-26 DIAGNOSIS — K5900 Constipation, unspecified: Secondary | ICD-10-CM | POA: Diagnosis not present

## 2017-09-26 DIAGNOSIS — T451X5S Adverse effect of antineoplastic and immunosuppressive drugs, sequela: Secondary | ICD-10-CM | POA: Diagnosis not present

## 2017-09-26 DIAGNOSIS — Z7189 Other specified counseling: Secondary | ICD-10-CM

## 2017-09-26 DIAGNOSIS — T451X5A Adverse effect of antineoplastic and immunosuppressive drugs, initial encounter: Secondary | ICD-10-CM

## 2017-09-26 DIAGNOSIS — Z95828 Presence of other vascular implants and grafts: Secondary | ICD-10-CM

## 2017-09-26 DIAGNOSIS — Z7952 Long term (current) use of systemic steroids: Secondary | ICD-10-CM

## 2017-09-26 LAB — COMPREHENSIVE METABOLIC PANEL
ALBUMIN: 2.5 g/dL — AB (ref 3.5–5.0)
ALK PHOS: 269 U/L — AB (ref 38–126)
ALT: 135 U/L — ABNORMAL HIGH (ref 17–63)
AST: 50 U/L — AB (ref 15–41)
Anion gap: 12 (ref 5–15)
BUN: 18 mg/dL (ref 6–20)
CALCIUM: 8.4 mg/dL — AB (ref 8.9–10.3)
CO2: 23 mmol/L (ref 22–32)
CREATININE: 1.09 mg/dL (ref 0.61–1.24)
Chloride: 91 mmol/L — ABNORMAL LOW (ref 101–111)
GFR calc Af Amer: >60 mL/min (ref >60)
GLUCOSE: 141 mg/dL — AB (ref 65–99)
Potassium: 5.2 mmol/L — ABNORMAL HIGH (ref 3.5–5.1)
Sodium: 126 mmol/L — ABNORMAL LOW (ref 135–145)
Total Bilirubin: 0.6 mg/dL (ref 0.3–1.2)
Total Protein: 7 g/dL (ref 6.5–8.1)

## 2017-09-26 LAB — CBC WITH DIFFERENTIAL/PLATELET
BASOS ABS: 0.1 10*3/uL (ref 0–0.1)
BASOS PCT: 1 %
Eosinophils Absolute: 0.1 10*3/uL (ref 0–0.7)
Eosinophils Relative: 1 %
HCT: 30.7 % — ABNORMAL LOW (ref 40.0–52.0)
HEMOGLOBIN: 9.7 g/dL — AB (ref 13.0–18.0)
LYMPHS PCT: 8 %
Lymphs Abs: 1.1 10*3/uL (ref 1.0–3.6)
MCH: 27.3 pg (ref 26.0–34.0)
MCHC: 31.6 g/dL — ABNORMAL LOW (ref 32.0–36.0)
MCV: 86.6 fL (ref 80.0–100.0)
MONOS PCT: 5 %
Monocytes Absolute: 0.7 10*3/uL (ref 0.2–1.0)
NEUTROS ABS: 11.1 10*3/uL — AB (ref 1.4–6.5)
NEUTROS PCT: 85 %
Platelets: 261 10*3/uL (ref 150–440)
RBC: 3.55 MIL/uL — ABNORMAL LOW (ref 4.40–5.90)
RDW: 19.6 % — ABNORMAL HIGH (ref 11.5–14.5)
WBC: 13.1 10*3/uL — ABNORMAL HIGH (ref 3.8–10.6)

## 2017-09-26 MED ORDER — SODIUM CHLORIDE 0.9% FLUSH
10.0000 mL | INTRAVENOUS | Status: DC | PRN
  Administered 2017-09-26: 10 mL via INTRAVENOUS
  Filled 2017-09-26: qty 10

## 2017-09-26 MED ORDER — MORPHINE SULFATE 15 MG PO TABS: 15.0000 mg | ORAL_TABLET | Freq: Four times a day (QID) | ORAL | 0 refills | Status: DC | PRN

## 2017-09-26 MED ORDER — SODIUM CHLORIDE 0.9 % IV SOLN
Freq: Once | INTRAVENOUS | Status: AC
  Administered 2017-09-26: 11:00:00 via INTRAVENOUS
  Filled 2017-09-26: qty 1000

## 2017-09-26 MED ORDER — MORPHINE SULFATE ER 30 MG PO TBCR: 60.0000 mg | EXTENDED_RELEASE_TABLET | Freq: Two times a day (BID) | ORAL | 0 refills | Status: DC

## 2017-09-26 MED ORDER — HEPARIN SOD (PORK) LOCK FLUSH 100 UNIT/ML IV SOLN
500.0000 [IU] | Freq: Once | INTRAVENOUS | Status: AC
  Administered 2017-09-26: 500 [IU] via INTRAVENOUS

## 2017-09-26 MED ORDER — APIXABAN 5 MG PO TABS: 5.0000 mg | ORAL_TABLET | Freq: Two times a day (BID) | ORAL | 0 refills | Status: AC

## 2017-09-26 MED ORDER — PREDNISONE 10 MG PO TABS: 40.0000 mg | ORAL_TABLET | Freq: Every day | ORAL | 0 refills | Status: DC

## 2017-09-26 MED ORDER — MORPHINE SULFATE ER 30 MG PO TBCR
60.0000 mg | EXTENDED_RELEASE_TABLET | Freq: Two times a day (BID) | ORAL | 0 refills | Status: DC
Start: 2017-09-26 — End: 2017-10-24

## 2017-09-26 NOTE — Progress Notes (Signed)
Here for follow up. Per pt and wife-had one episode of vomitting x 1 this am -otherwise  None x 1 week. BP 74/49 -drinking fluids per wife and pt. Pt stated feeling " a little light headedness " this am (also feels that way at home he stated )

## 2017-09-28 ENCOUNTER — Ambulatory Visit: Admission: RE | Admit: 2017-09-28 | Payer: Medicare Other | Source: Ambulatory Visit

## 2017-10-01 ENCOUNTER — Ambulatory Visit
Admission: RE | Admit: 2017-10-01 | Discharge: 2017-10-01 | Disposition: A | Discharge status: Home / Self Care | Payer: Medicare Other | Source: Ambulatory Visit | Attending: Oncology | Admitting: Oncology

## 2017-10-01 ENCOUNTER — Ambulatory Visit: Payer: Medicare Other

## 2017-10-01 ENCOUNTER — Other Ambulatory Visit: Payer: Self-pay | Admitting: Oncology

## 2017-10-01 ENCOUNTER — Telehealth: Payer: Self-pay | Admitting: *Deleted

## 2017-10-01 DIAGNOSIS — R918 Other nonspecific abnormal finding of lung field: Secondary | ICD-10-CM | POA: Insufficient documentation

## 2017-10-01 DIAGNOSIS — C641 Malignant neoplasm of right kidney, except renal pelvis: Secondary | ICD-10-CM | POA: Diagnosis not present

## 2017-10-01 DIAGNOSIS — N2889 Other specified disorders of kidney and ureter: Secondary | ICD-10-CM | POA: Insufficient documentation

## 2017-10-01 DIAGNOSIS — C7951 Secondary malignant neoplasm of bone: Secondary | ICD-10-CM

## 2017-10-01 DIAGNOSIS — C787 Secondary malignant neoplasm of liver and intrahepatic bile duct: Secondary | ICD-10-CM | POA: Diagnosis not present

## 2017-10-01 MED ORDER — IOPAMIDOL (ISOVUE-300) INJECTION 61%
100.0000 mL | Freq: Once | INTRAVENOUS | Status: AC | PRN
  Administered 2017-10-01: 100 mL via INTRAVENOUS

## 2017-10-01 NOTE — Telephone Encounter (Signed)
Called report  IMPRESSION: 1. New bilobed hepatic metastasis. 2. RIGHT renal cortical and hilar mass is similar. 3. Concern for involvement of the IVC and RIGHT renal vein. Similar findings. 4. Skeletal metastasis at L2. Probable new sclerotic metastasis at T 10 . These results will be called to the ordering clinician or representative by the Radiologist Assistant, and communication documented in the PACS or zVision Dashboard.   Electronically Signed   By: Suzy Bouchard M.D.   On: 10/01/2017 15:23

## 2017-10-02 NOTE — Progress Notes (Signed)
[facility] Follow Up visit  Patient Care Team: [name], MD as PCP - General (Physician Assistant)  REASON FOR VISIT Treatment of Locally advanced kidney urothelial cancer  ONCOLOGY HISTORY Joseph Ritter 77 y.o. male with PMH listed as below  is referred to [place] for evaluation of locally advanced kidney urothelia cancer. Patient developed gross hematuria at the end of 2017. CT 08/2016 showed endophytic hypodense mass. Repeat image 01/2017 showed enlarged  endophytic hypodense mass right kidney. 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 Right robotic nephroureterectomy was attempted but aborted due to unresectable newly metastatic disease diagnosed intraoperatively. Postop he underwent a CT scan of the abdomen and pelvis which showed extension of an infiltrative mass from the right kidney along the right renal vein toward the IVC with encasement of the IVC.  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 He self catheterize a few time a day.  He reports having back pain which limits her ability to walk. Also constipated and abdominal bloating. His appetite is fair. He takes Norco for abdominal and back pain.  He can walk with walker for very short distance, mostly limited by back pain and fatigue./weakness.  # Images: CT chest and abdomen 05/03/2017 1. Pneumoperitoneum with a small amount of ascites, gas tracking along the thoracoabdominal wall into the scrotum, and a small amount of gas in the right perirenal space-these findings are likely related to the patient's attempted nephroureterectomy yesterday. Surveillance to ensure expected resolution of the pneumoperitoneum might be appropriate. 2. The right kidney lower pole infarct with lack of patency of the more inferior of the 3 right renal arteries. There is delayed nephrogram on the right with increase an infiltrative process in theright  kidney and extending into the right renal hilar vascular structures, including the right renal vein and the adjacent IVC, compatible with tumor thrombus. There is also soft tissue density surrounding the renal hilar vascular structures and extending into the retroperitoneum and periaortic region, some of which may be tumor and some of which may be hematoma.3. Demineralization of the anterior inferior L2 vertebral body,concerning for tumor invasion. This is near the level of the retroaortic left renal vein. 4. No findings of metastatic disease to the chest. There are new trace bilateral pleural effusions with mild passive atelectasis.  #Pathology: 05/02/2017 SPECIMEN SUBMITTED: A. Hilar tissue, right kidney B. Hilar tissue, right kidney DIAGNOSIS:  A and B. SOFT TISSUE, RIGHT RENAL HILUM  PD-L1 - Urothelial (TECENTRIQ) Immunohistochemistry Analysis  Result: 0% Interpretation: No expression   # Current Treatment: 1 Palliative RT to spine 2 Chemotherapy:     06/01/2017 Cycle 1 Gemcitabine and Carboplain.  Day 8 Gemcitabine was delayed due to intractable nausea and vomiting.     Chemotherapy related thrombocytopenia, no active bleeding. 50% dose of Gemcitabine was given on day 11.     06/25/2017 Cycle 2 Day 1 Gemcitabine and Carboplain.    07/03/2017 Cycle 2 Day 8 chemotherapy discontinued.      3 Immunotherapy:     07/27/2017 cycle 1 of Atezolizumab, 11/26 2018 cycle 2 Atezolizumab was held due to hepatic toxicity.        INTERVAL HISTORY 77 yo male who has above oncology history reviewed by me today presents for evaluation for liver toxicity and discussion of CT results. Patient reports feeling better, appetite is okay. Denies any nausea or vomiting.Wife reports that patient usually feels well until he has to  come to the clinic visit and has to sit on his tailbone. During interval he has had CT test done.  Review of Systems  Constitutional: Positive for fatigue. Negative for chills, diaphoresis  and fever.  HENT:   Negative for lump/mass, nosebleeds and sore throat.   Eyes: Negative for eye problems and icterus.  Respiratory: Negative for chest tightness, cough and hemoptysis.   Cardiovascular: Negative for chest pain, leg swelling and palpitations.  Gastrointestinal: Negative for abdominal pain, blood in stool, nausea and vomiting.  Endocrine: Negative for hot flashes.  Genitourinary: Negative for bladder incontinence, difficulty urinating, dysuria and hematuria.   Musculoskeletal: Positive for back pain. Negative for flank pain and myalgias.  Skin: Negative for itching and rash.  Neurological: Negative for dizziness and headaches.  Hematological: Does not bruise/bleed easily.  Psychiatric/Behavioral: Negative for decreased concentration. The patient is not nervous/anxious.      MEDICAL HISTORY: Past Medical History:  Diagnosis Date  . Acid indigestion 03/09/2014  . Adult hypothyroidism 11/18/2012  . Anxiety   . Arteriosclerosis of coronary artery 11/18/2012   Overview:  PATENT LIMA TO LAD, PATENT SVG TO PDA AND OCCULUDED SVG TO OM2 BY CATHERIZATION 02/09/2009   . 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 Borderline diabetes 05/25/2014  . BP (high blood pressure) 11/18/2012  . BPH (benign prostatic hypertrophy)   . CAFL (chronic airflow limitation) ([place]) 05/03/2015  . Cancer ([place])    renal cancer with mets  . CCF (congestive cardiac failure) ([place]) 01/03/2014   Overview:  HX OF   . COPD (chronic obstructive pulmonary disease) ([place])   . DDD (degenerative disc disease), lumbar 06/29/2014  . 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 Gastric ulcer 03/09/2014  . 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 History of urinary self-catheterization 2017  . HLD (hyperlipidemia) 01/03/2014  . Incomplete bladder emptying 11/18/2012  . Leg weakness 11/18/2012  .  Lumbar radiculitis   . Neuritis or radiculitis due to rupture of lumbar intervertebral disc 06/29/2014  . Overactive bladder   . PONV (postoperative nausea and vomiting)    years ago with Ether, no problem with Nausea or vomiting with the last few surgerys  . Pre-diabetes   . Subacute transverse myelitis ([place]) 11/21/2012  . Teeth problem    pt reports "bad teeth", "need to be pulled"    SURGICAL HISTORY: Past Surgical History:  Procedure Laterality Date  . ARTERY BIOPSY Right 12/16/2015   Procedure: BIOPSY TEMPORAL ARTERY;  Surgeon: Algernon Huxley, MD;  Location: [facility];  Service: Vascular;  Laterality: Right;  . CARDIAC CATHETERIZATION    . CATARACT EXTRACTION W/PHACO Left 01/26/2016   Procedure: CATARACT EXTRACTION PHACO AND INTRAOCULAR LENS PLACEMENT (IOC) LEFT EYE;  Surgeon: Leandrew Koyanagi, MD;  Location: [facility];  Service: Ophthalmology;  Laterality: Left;  . CORONARY ARTERY BYPASS GRAFT  04/07/2002   [facility]  . Cysto Bladder Botox Injection  07/02/2014  . CYSTOSCOPY W/ RETROGRADES Right 03/16/2017   Procedure: CYSTOSCOPY WITH RETROGRADE PYELOGRAM;  Surgeon: [name], MD;  Location: [facility];  Service: Urology;  Laterality: Right;  . CYSTOSCOPY WITH STENT PLACEMENT Right 03/16/2017   Procedure: CYSTOSCOPY WITH STENT PLACEMENT;  Surgeon: [name], MD;  Location: [facility];  Service: Urology;  Laterality: Right;  . EYE SURGERY Left    blood clot behind left eye  .  HAND SURGERY Bilateral   . HERNIA REPAIR Right    inguinal hernia repair  . LEFT HEART CATH AND CORS/GRAFTS ANGIOGRAPHY N/A 04/25/2017   Procedure: Left Heart Cath and Cors/Grafts Angiography;  Surgeon: Isaias Cowman, MD;  Location: [facility];  Service: Cardiovascular;  Laterality: N/A;  . PORTA CATH INSERTION N/A 05/30/2017   Procedure: Glori Luis Cath Insertion;  Surgeon: Katha Cabal, MD;  Location: [facility];  Service: Cardiovascular;  Laterality: N/A;  . ROBOT  ASSITED LAPAROSCOPIC NEPHROURETERECTOMY Right 05/02/2017   Procedure: ROBOT ASSITED LAPAROSCOPIC NEPHROURETERECTOMY ATTEMPTED;  Surgeon: [name], MD;  Location: [facility];  Service: Urology;  Laterality: Right;  . URETEROSCOPY Right 03/16/2017   Procedure: URETEROSCOPY BIOPSY RENAL MASS;  Surgeon: [name], MD;  Location: [facility];  Service: Urology;  Laterality: Right;    SOCIAL HISTORY: Social History   Socioeconomic History  . Marital status: Married    Spouse name: Not on file  . Number of children: Not on file  . Years of education: Not on file  . Highest education level: Not on file  Social Needs  . Financial resource strain: Not on file  . Food insecurity - worry: Not on file  . Food insecurity - inability: Not on file  . Transportation needs - medical: Not on file  . Transportation needs - non-medical: Not on file  Occupational History  . Not on file  Tobacco Use  . Smoking status: Former Smoker    Types: Cigarettes    Last attempt to quit: 04/07/2002    Years since quitting: 15.4  . Smokeless tobacco: Former Systems developer    Types: Snuff, Chew  Substance and Sexual Activity  . Alcohol use: No    Alcohol/week: 0.0 oz  . Drug use: No  . Sexual activity: Not on file  Other Topics Concern  . Not on file  Social History Narrative  . Not on file    FAMILY HISTORY Family History  Problem Relation Age of Onset  . Heart attack Mother   . Heart disease Father   . Prostate cancer Neg Hx   . Bladder Cancer Neg Hx   . Kidney cancer Neg Hx     ALLERGIES:  has No Known Allergies.  MEDICATIONS:  Current Outpatient Medications  Medication Sig Dispense Refill  . apixaban (ELIQUIS) 5 MG TABS tablet Take 1 tablet (5 mg total) by mouth 2 (two) times daily. 60 tablet 0  . buPROPion (WELLBUTRIN SR) 100 MG 12 hr tablet TAKE ONE TABLET BY MOUTH EVERY DAY    . cyclobenzaprine (FLEXERIL) 10 MG tablet Take 10 mg by mouth at bedtime as needed for muscle spasms.    Marland Kitchen  ezetimibe-simvastatin (VYTORIN) 10-40 MG tablet Take 1 tablet by mouth at bedtime.    . feeding supplement, ENSURE ENLIVE, (ENSURE ENLIVE) LIQD Take 237 mLs by mouth 2 (two) times daily between meals. 60 Bottle 0  . 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    Marland Kitchen ketoconazole (NIZORAL) 2 % shampoo APPLY SHAMPOO TO HAIR AS NEEDED FOR FLAKY/ITCHY SCALP (TYPICALLY EVERY 2-3 DAYS)    . levothyroxine (SYNTHROID, LEVOTHROID) 125 MCG tablet TAKE ONE TABLET BY MOUTH EVERY DAY BEFORE BREAKFAST 30 tablet 0  . lidocaine (LIDODERM) 5 % Place 1 patch onto the skin every 12 (twelve) hours. Remove & Discard patch within 12 hours or as directed by  MD 60 patch 11  . LORazepam (ATIVAN) 0.5 MG tablet Take 1 tablet (0.5 mg total) by mouth every 8 (eight) hours as needed for anxiety (nausea). (Patient not taking: Reported on 09/26/2017) 30 tablet 0  . morphine (MS CONTIN) 30 MG 12 hr tablet Take 2 tablets (60 mg total) by mouth every 12 (twelve) hours. 120 tablet 0  . morphine (MSIR) 15 MG tablet Take 1 tablet (15 mg total) by mouth every 6 (six) hours as needed for moderate pain or severe pain. 120 tablet 0  . Multiple Vitamin (MULTIVITAMIN WITH MINERALS) TABS tablet Take 1 tablet by mouth daily after breakfast. CENTRUM SILVER    . nystatin (MYCOSTATIN) 100000 UNIT/ML suspension   2  . ondansetron (ZOFRAN) 8 MG tablet TAKE 1 TAB BY MOUTH 2 TIMES DAILY AS NEEDED FOR REFRACTORY NAUSEA/VOMITING. START ON DAY 3 AFTER CHEMO 30 tablet 1  . PEPPERMINT OIL PO Take 1 capsule by mouth 3 (three) times daily as needed (for IBS). IBgard (active ingredient: 90 mg ultrapurified peppermint oil)    . polyethylene glycol powder (GLYCOLAX/MIRALAX) powder Take 17 g by mouth daily after breakfast. Mix with glass of water 255 g 1  . predniSONE (DELTASONE) 10 MG tablet Take 4 tablets (40 mg total) by mouth daily with breakfast. 28  tablet 0  . promethazine (PHENERGAN) 25 MG suppository Place 1 suppository (25 mg total) rectally every 6 (six) hours as needed for nausea. (Patient not taking: Reported on 09/26/2017) 30 suppository 0  . promethazine (PHENERGAN) 25 MG tablet Take 1 tablet (25 mg total) by mouth every 6 (six) hours as needed for nausea or vomiting. (Patient not taking: Reported on 09/26/2017) 30 tablet 0  . RABEprazole (ACIPHEX) 20 MG tablet Take 20 mg by mouth daily before breakfast.     . ramipril (ALTACE) 10 MG capsule Take 10 mg by mouth daily after breakfast.    . SENNA LAX 8.6 MG tablet TAKE TWO TABLETS BY MOUTH DAILY 120 tablet 3  . sertraline (ZOLOFT) 100 MG tablet Take 100 mg by mouth daily after breakfast.    . sodium phosphate (FLEET) 7-19 GM/118ML ENEM Place 133 mLs (1 enema total) rectally daily as needed for severe constipation. (Patient not taking: Reported on 09/26/2017) 5 enema 0  . tamsulosin (FLOMAX) 0.4 MG CAPS capsule Take 0.8 mg by mouth daily after supper.      No current facility-administered medications for this visit.    Facility-Administered Medications Ordered in Other Visits  Medication Dose Route Frequency Provider Last Rate Last Dose  . heparin lock flush 100 unit/mL  500 Units Intravenous Once Earlie Server, MD      . sodium chloride flush (NS) 0.9 % injection 10 mL  10 mL Intravenous PRN Earlie Server, MD   10 mL at 07/03/17 0930    PHYSICAL EXAMINATION:  ECOG PERFORMANCE STATUS: 2 - Symptomatic, <50% confined to bed  Vitals:   10/03/17 1452  BP: 105/66  Pulse: (!) 118  Resp: 20  Temp: 97.6 F (36.4 C)    Filed Weights     Physical Exam  Constitutional: He is oriented to person, place, and time. No distress.  HENT:  Head: Normocephalic and atraumatic.  Eyes: Conjunctivae and EOM are normal. Pupils are equal, round, and reactive to light. Left eye exhibits no discharge. No scleral icterus.  Neck: Normal range of motion. Neck supple. No JVD present. No tracheal deviation  present.  Cardiovascular: Regular rhythm.  No murmur heard. tachycardia  Pulmonary/Chest: No stridor.  No respiratory distress. He has no wheezes. He has no rales.  Abdominal: He exhibits no distension. There is no tenderness. There is no rebound.  Genitourinary:  Genitourinary Comments: Foley catheter.   Musculoskeletal: Normal range of motion. He exhibits no edema.  Palpable spinal tenderness.  Lymphadenopathy:    He has no cervical adenopathy.  Neurological: He is alert and oriented to person, place, and time. No cranial nerve deficit. Coordination normal.  Skin: Skin is warm. No rash noted. He is not diaphoretic. No erythema.  Psychiatric: Affect normal.    ECOG 2  LABORATORY DATA: I have personally reviewed the data as listed: CBC    Component Value Date/Time   WBC 13.1 (H) 09/26/2017 0945   RBC 3.55 (L) 09/26/2017 0945   HGB 9.7 (L) 09/26/2017 0945   HGB 15.3 11/15/2012 1641   HCT 30.7 (L) 09/26/2017 0945   HCT 43.7 11/15/2012 1641   PLT 261 09/26/2017 0945   PLT 166 11/15/2012 1641   MCV 86.6 09/26/2017 0945   MCV 89 11/15/2012 1641   MCH 27.3 09/26/2017 0945   MCHC 31.6 (L) 09/26/2017 0945   RDW 19.6 (H) 09/26/2017 0945   RDW 13.3 11/15/2012 1641   LYMPHSABS 1.1 09/26/2017 0945   LYMPHSABS 1.7 11/15/2012 1641   MONOABS 0.7 09/26/2017 0945   MONOABS 0.8 11/15/2012 1641   EOSABS 0.1 09/26/2017 0945   EOSABS 0.1 11/15/2012 1641   BASOSABS 0.1 09/26/2017 0945   BASOSABS 0.0 11/15/2012 1641   CMP Latest Ref Rng & Units 10/03/2017 09/26/2017 09/12/2017  Glucose 65 - 99 mg/dL 146(H) 141(H) 151(H)  BUN 6 - 20 mg/dL _0 Creatinine 0.61 - 1.24 mg/dL 0.74 1.09 0.99  Sodium 135 - 145 mmol/L 129(L) 126(L) 135  Potassium 3.5 - 5.1 mmol/L 4.7 5.2(H) 4.4  Chloride 101 - 111 mmol/L 91(L) 91(L) 102  CO2 22 - 32 mmol/L _1 Calcium 8.9 - 10.3 mg/dL 8.7(L) 8.4(L) 7.9(L)  Total Protein 6.5 - 8.1 g/dL 6.9 7.0 6.1(L)  Total Bilirubin 0.3 - 1.2 mg/dL 0.5 0.6 0.4   Alkaline Phos 38 - 126 U/L 321(H) 269(H) 199(H)  AST 15 - 41 U/L 66(H) 50(H) 58(H)  ALT 17 - 63 U/L 185(H) 135(H) 165(H)     RADIOGRAPHIC STUDIES: I have personally reviewed the radiological images as listed and agree with the findings in the report 5.  Aortic Atherosclerosis (ICD10-I70.0).  Coronary atherosclerosis. 6. Inflammatory stranding from the right perirenal space extends down into the pelvis and crosses the midline. There is presacral edema. 7. 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 Recommend followup by ultrasound in 3 years. This recommendation follows ACR consensus guidelines: White Paper of the ACR Incidental Findings Committee II on Vascular Findings. J Am Coll Radiol 2013; 23:557-322 8. Wall thickening along the left side of the urinary bladder may be incidental, but synchronous sessile transitional cell carcinoma is not readily excluded.  CT abdomen wo contrast 02/09/2017  Increased size of 4.6 cm ill-defined hypoenhancing mass ininterpolar region of right kidney, with obstruction of upper pole collecting system. This does not have typical appearance for pyelonephritis or renal cell carcinoma, and raises suspicion for urothelial carcinoma. Consider ureteroscopy for further evaluation. No evidence of metastatic disease. Stable mildly enlarged prostate and mild diffuse bladder wall thickening. Stable 3.0 cm infrarenal abdominal aortic aneurysm. Recommend followup by ultrasound in 3 years. This recommendation follows ACR consensus guidelines: White Paper of the ACR Incidental Findings Committee II on Vascular Findings. J  Am Coll Radiol [phone]. Colonic diverticulosis. No radiographic evidence of diverticulitis. Stable small fat-containing left inguinal hernia. MRI thoracic and lumbar w/wo contrast.  1. Malignant invasion of the L2 vertebral body by the adjacent retroperitoneal mass. No pathologic fracture or osseous metastatic disease  elsewhere in the thoracolumbar spine. 2. Areas of epidural contrast enhancement of the lumbar spine are most consistent with a dilated epidural venous plexus secondary to invasion of the inferior vena cava by the patient's urothelial tumor. 3. Moderate L4-L5 multifactorial spinal canal stenosis with severe right and mild-to-moderate left neural foraminal stenosis. 4. Moderate right and severe left multifactorial L5-S1 neural foraminal stenosis. There is also severe attenuation of the thecal sac at this level but no bony spinal canal stenosis. 5. No spinal canal or neural foraminal stenosis in the thoracic spine.   10/01/2017 CT abdomen and pelvis with contrast showed multiple new hepatic metastasis, right renal cortical and hilar mass is similar, similar findings of involvement of the IVC in the right renal vein. Skeletal metastasis at L2, probable new skeletal metastasis at T10. 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 Transitional cell carcinoma of kidney, right ([place])   2. 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 Goals of care, counseling/discussion   4. Transaminitis    # Hold immunotherapy due to hepatotoxicity. He has only got one dose of Tecentriq and got liver toxicity. Continue steroids 40 mg for another week, if LFT improves, we'll taper to 30 mg daily. # CT results discussed with patient. He has rational disease. # goal  care was discussed with patient in detail. Patient is aware that his prognosis is extremely poor and he may only have short period of time left. The concerning liver metastasis may progress very fast and because organ failure. Meanwhile immunotherapy cannot be  continued due to presumed liver toxicity from immunotherapy. We cannot give him any radiotherapy until his steroids has been tapered below 10 mg daily with continued improvement of LFTs. His liver function tests can be also eventually affected by liver metastasis. So far his bilirubin remains normal. Patient tells me that he does not want to go for hospice right now. He continued to be hopeful. He also understands that his condition may deteriorate very quickly. He wished to  Continued on all supportive care at he can get. We will have ongoing goal of care discussion every week when he comes back for reevaluation.  # His hypotension has improved after blood pressure medication was held. I advised patient to continue hold antihypertensive medication.  # Current patient medication regimen is Morphin IR 4m Q6h PRN for pain in combination with MS contin 640mBID. Reports he has enough supply at home. # Xgeva Q28 days due on 10/01/2017, okay to proceed today.  #Hypothyroidism on Synthroid 12560mdaily. TSH remains elevated. We'll need to increase his Synthroid dose.  Follow up in 1 week to repeat labs and further taper steroid course.   ZhoEarlie ServerD, PhD Hematology Oncology [facility] [id number]

## 2017-10-03 ENCOUNTER — Inpatient Hospital Stay (HOSPITAL_BASED_OUTPATIENT_CLINIC_OR_DEPARTMENT_OTHER): Payer: Medicare Other | Admitting: Oncology

## 2017-10-03 ENCOUNTER — Ambulatory Visit (INDEPENDENT_AMBULATORY_CARE_PROVIDER_SITE_OTHER): Payer: Medicare Other

## 2017-10-03 ENCOUNTER — Encounter: Payer: Self-pay | Admitting: Oncology

## 2017-10-03 ENCOUNTER — Inpatient Hospital Stay: Payer: Medicare Other

## 2017-10-03 VITALS — BP 105/66 | HR 118 | Temp 97.6°F | Resp 20

## 2017-10-03 VITALS — BP 107/34 | HR 108 | Ht 66.0 in | Wt 134.0 lb

## 2017-10-03 DIAGNOSIS — R740 Nonspecific elevation of levels of transaminase and lactic acid dehydrogenase [LDH]: Secondary | ICD-10-CM | POA: Diagnosis not present

## 2017-10-03 DIAGNOSIS — N400 Enlarged prostate without lower urinary tract symptoms: Secondary | ICD-10-CM | POA: Diagnosis not present

## 2017-10-03 DIAGNOSIS — C7951 Secondary malignant neoplasm of bone: Secondary | ICD-10-CM

## 2017-10-03 DIAGNOSIS — R7401 Elevation of levels of liver transaminase levels: Secondary | ICD-10-CM

## 2017-10-03 DIAGNOSIS — C641 Malignant neoplasm of right kidney, except renal pelvis: Secondary | ICD-10-CM

## 2017-10-03 DIAGNOSIS — R7303 Prediabetes: Secondary | ICD-10-CM | POA: Diagnosis not present

## 2017-10-03 DIAGNOSIS — Z905 Acquired absence of kidney: Secondary | ICD-10-CM | POA: Diagnosis not present

## 2017-10-03 DIAGNOSIS — E785 Hyperlipidemia, unspecified: Secondary | ICD-10-CM | POA: Diagnosis not present

## 2017-10-03 DIAGNOSIS — R5383 Other fatigue: Secondary | ICD-10-CM | POA: Diagnosis not present

## 2017-10-03 DIAGNOSIS — Z7189 Other specified counseling: Secondary | ICD-10-CM

## 2017-10-03 DIAGNOSIS — E039 Hypothyroidism, unspecified: Secondary | ICD-10-CM | POA: Diagnosis not present

## 2017-10-03 DIAGNOSIS — R531 Weakness: Secondary | ICD-10-CM | POA: Diagnosis not present

## 2017-10-03 DIAGNOSIS — N319 Neuromuscular dysfunction of bladder, unspecified: Secondary | ICD-10-CM | POA: Diagnosis not present

## 2017-10-03 DIAGNOSIS — J449 Chronic obstructive pulmonary disease, unspecified: Secondary | ICD-10-CM | POA: Diagnosis not present

## 2017-10-03 DIAGNOSIS — D6959 Other secondary thrombocytopenia: Secondary | ICD-10-CM | POA: Diagnosis not present

## 2017-10-03 DIAGNOSIS — M5136 Other intervertebral disc degeneration, lumbar region: Secondary | ICD-10-CM | POA: Diagnosis not present

## 2017-10-03 DIAGNOSIS — Z906 Acquired absence of other parts of urinary tract: Secondary | ICD-10-CM | POA: Diagnosis not present

## 2017-10-03 DIAGNOSIS — C679 Malignant neoplasm of bladder, unspecified: Secondary | ICD-10-CM | POA: Diagnosis not present

## 2017-10-03 DIAGNOSIS — K5900 Constipation, unspecified: Secondary | ICD-10-CM | POA: Diagnosis not present

## 2017-10-03 DIAGNOSIS — T451X5S Adverse effect of antineoplastic and immunosuppressive drugs, sequela: Secondary | ICD-10-CM | POA: Diagnosis not present

## 2017-10-03 DIAGNOSIS — R000 Tachycardia, unspecified: Secondary | ICD-10-CM | POA: Diagnosis not present

## 2017-10-03 DIAGNOSIS — I2510 Atherosclerotic heart disease of native coronary artery without angina pectoris: Secondary | ICD-10-CM | POA: Diagnosis not present

## 2017-10-03 DIAGNOSIS — E871 Hypo-osmolality and hyponatremia: Secondary | ICD-10-CM | POA: Diagnosis not present

## 2017-10-03 DIAGNOSIS — M549 Dorsalgia, unspecified: Secondary | ICD-10-CM | POA: Diagnosis not present

## 2017-10-03 DIAGNOSIS — Z9225 Personal history of immunosupression therapy: Secondary | ICD-10-CM | POA: Diagnosis not present

## 2017-10-03 DIAGNOSIS — I959 Hypotension, unspecified: Secondary | ICD-10-CM | POA: Diagnosis not present

## 2017-10-03 LAB — CBC WITH DIFFERENTIAL/PLATELET
BASOS ABS: 0 10*3/uL (ref 0–0.1)
Basophils Relative: 0 %
Eosinophils Absolute: 0.1 10*3/uL (ref 0–0.7)
Eosinophils Relative: 1 %
HEMATOCRIT: 30.7 % — AB (ref 40.0–52.0)
Hemoglobin: 9.6 g/dL — ABNORMAL LOW (ref 13.0–18.0)
LYMPHS PCT: 5 %
Lymphs Abs: 0.6 10*3/uL — ABNORMAL LOW (ref 1.0–3.6)
MCH: 26.7 pg (ref 26.0–34.0)
MCHC: 31.4 g/dL — ABNORMAL LOW (ref 32.0–36.0)
MCV: 85.1 fL (ref 80.0–100.0)
Monocytes Absolute: 0.7 10*3/uL (ref 0.2–1.0)
Monocytes Relative: 5 %
NEUTROS PCT: 89 %
Neutro Abs: 10.9 10*3/uL — ABNORMAL HIGH (ref 1.4–6.5)
PLATELETS: 236 10*3/uL (ref 150–440)
RBC: 3.6 MIL/uL — AB (ref 4.40–5.90)
RDW: 19.2 % — ABNORMAL HIGH (ref 11.5–14.5)
WBC: 12.3 10*3/uL — AB (ref 3.8–10.6)

## 2017-10-03 LAB — COMPREHENSIVE METABOLIC PANEL
ALT: 185 U/L — ABNORMAL HIGH (ref 17–63)
ANION GAP: 13 (ref 5–15)
AST: 66 U/L — ABNORMAL HIGH (ref 15–41)
Albumin: 2.4 g/dL — ABNORMAL LOW (ref 3.5–5.0)
Alkaline Phosphatase: 321 U/L — ABNORMAL HIGH (ref 38–126)
BILIRUBIN TOTAL: 0.5 mg/dL (ref 0.3–1.2)
BUN: 11 mg/dL (ref 6–20)
CO2: 25 mmol/L (ref 22–32)
Calcium: 8.7 mg/dL — ABNORMAL LOW (ref 8.9–10.3)
Chloride: 91 mmol/L — ABNORMAL LOW (ref 101–111)
Creatinine, Ser: 0.74 mg/dL (ref 0.61–1.24)
GFR calc Af Amer: >60 mL/min (ref >60)
Glucose, Bld: 146 mg/dL — ABNORMAL HIGH (ref 65–99)
POTASSIUM: 4.7 mmol/L (ref 3.5–5.1)
Sodium: 129 mmol/L — ABNORMAL LOW (ref 135–145)
TOTAL PROTEIN: 6.9 g/dL (ref 6.5–8.1)

## 2017-10-03 LAB — TSH: TSH: 10.698 u[IU]/mL — ABNORMAL HIGH (ref 0.350–4.500)

## 2017-10-03 MED ORDER — PREDNISONE 10 MG PO TABS: 40.0000 mg | ORAL_TABLET | Freq: Every day | ORAL | 0 refills | Status: DC

## 2017-10-03 MED ORDER — DENOSUMAB 120 MG/1.7ML ~~LOC~~ SOLN
120.0000 mg | Freq: Once | SUBCUTANEOUS | Status: AC
  Administered 2017-10-03: 120 mg via SUBCUTANEOUS
  Filled 2017-10-03: qty 1.7

## 2017-10-03 MED ORDER — LEVOTHYROXINE SODIUM 125 MCG PO TABS: 137.5000 ug | ORAL_TABLET | Freq: Every day | ORAL | 0 refills | Status: DC

## 2017-10-03 NOTE — Progress Notes (Signed)
Cath Change/ Replacement  Patient is present today for a catheter change due to urinary retention.  64ml of water was removed from the balloon, a 16FR foley cath was removed with out difficulty.  Patient was cleaned and prepped in a sterile fashion with betadine and 2% lidocaine jelly was instilled into the urethra. A 16 FR foley cath was replaced into the bladder no complications were noted Urine return was noted 22ml and urine was yellow in color. The balloon was filled with 81ml of sterile water. A leg bag was attached for drainage.  A night bag was also given to the patient and patient was given instruction on how to change from one bag to another. Patient was given proper instruction on catheter care.    Preformed by: Toniann Fail, LPN   Follow up: 1 month  Blood pressure (!) 107/34, pulse (!) 108, height 5\' 6"  (1.676 m), weight 134 lb (60.8 kg).

## 2017-10-04 ENCOUNTER — Other Ambulatory Visit: Payer: Self-pay | Admitting: *Deleted

## 2017-10-04 MED ORDER — LEVOTHYROXINE SODIUM 137 MCG PO TABS: 137.0000 ug | ORAL_TABLET | Freq: Every day | ORAL | 0 refills | Status: AC

## 2017-10-09 NOTE — Progress Notes (Signed)
[facility] Follow Up visit  Patient Care Team: [name], MD as PCP - General (Physician Assistant)  REASON FOR VISIT Treatment of Locally advanced kidney urothelial cancer  ONCOLOGY HISTORY Joseph Ritter 77 y.o. male with PMH listed as below  is referred to [place] for evaluation of locally advanced kidney urothelia cancer. Patient developed gross hematuria at the end of 2017. CT 08/2016 showed endophytic hypodense mass. Repeat image 01/2017 showed enlarged  endophytic hypodense mass right kidney. 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 Right robotic nephroureterectomy was attempted but aborted due to unresectable newly metastatic disease diagnosed intraoperatively. Postop he underwent a CT scan of the abdomen and pelvis which showed extension of an infiltrative mass from the right kidney along the right renal vein toward the IVC with encasement of the IVC.  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 He self catheterize a few time a day.  He reports having back pain which limits her ability to walk. Also constipated and abdominal bloating. His appetite is fair. He takes Norco for abdominal and back pain.  He can walk with walker for very short distance, mostly limited by back pain and fatigue./weakness.  # Images: CT chest and abdomen 05/03/2017 1. Pneumoperitoneum with a small amount of ascites, gas tracking along the thoracoabdominal wall into the scrotum, and a small amount of gas in the right perirenal space-these findings are likely related to the patient's attempted nephroureterectomy yesterday. Surveillance to ensure expected resolution of the pneumoperitoneum might be appropriate. 2. The right kidney lower pole infarct with lack of patency of the more inferior of the 3 right renal arteries. There is delayed nephrogram on the right with increase an infiltrative process in theright  kidney and extending into the right renal hilar vascular structures, including the right renal vein and the adjacent IVC, compatible with tumor thrombus. There is also soft tissue density surrounding the renal hilar vascular structures and extending into the retroperitoneum and periaortic region, some of which may be tumor and some of which may be hematoma.3. Demineralization of the anterior inferior L2 vertebral body,concerning for tumor invasion. This is near the level of the retroaortic left renal vein. 4. No findings of metastatic disease to the chest. There are new trace bilateral pleural effusions with mild passive atelectasis.  #Pathology: 05/02/2017 SPECIMEN SUBMITTED: A. Hilar tissue, right kidney B. Hilar tissue, right kidney DIAGNOSIS:  A and B. SOFT TISSUE, RIGHT RENAL HILUM  PD-L1 - Urothelial (TECENTRIQ) Immunohistochemistry Analysis  Result: 0% Interpretation: No expression   # Current Treatment: 1 Palliative RT to spine 2 Chemotherapy:     06/01/2017 Cycle 1 Gemcitabine and Carboplain.  Day 8 Gemcitabine was delayed due to intractable nausea and vomiting.     Chemotherapy related thrombocytopenia, no active bleeding. 50% dose of Gemcitabine was given on day 11.     06/25/2017 Cycle 2 Day 1 Gemcitabine and Carboplain.    07/03/2017 Cycle 2 Day 8 chemotherapy discontinued.      3 Immunotherapy:     07/27/2017 cycle 1 of Atezolizumab, 11/26 2018 cycle 2 Atezolizumab was held due to hepatic toxicity.        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 His TSH was found to be elevated, and we adjusted his Synthroid dosage. Patient reports feeling better, appetite is okay. He reports strength in  a lot of plain water plus Gatorade/Pedialyte /Ensure drinks.  Denies any nausea or vomiting.  Review of Systems  Constitutional: Positive for fatigue. Negative for chills, diaphoresis and  fever.  HENT:   Negative for lump/mass, nosebleeds, sore throat and tinnitus.   Eyes: Negative for eye problems and icterus.  Respiratory: Negative for chest tightness and hemoptysis.   Cardiovascular: Negative for chest pain and palpitations.  Gastrointestinal: Negative for abdominal distention, abdominal pain, blood in stool, diarrhea, nausea and vomiting.  Endocrine: Negative for hot flashes.  Genitourinary: Negative for bladder incontinence, difficulty urinating, dysuria, frequency and hematuria.   Musculoskeletal: Positive for back pain. Negative for flank pain and myalgias.  Skin: Negative for itching and rash.  Neurological: Negative for dizziness, headaches and numbness.  Hematological: Does not bruise/bleed easily.  Psychiatric/Behavioral: Negative for confusion and decreased concentration. The patient is not nervous/anxious.      MEDICAL HISTORY: Past Medical History:  Diagnosis Date  . Acid indigestion 03/09/2014  . Adult hypothyroidism 11/18/2012  . Anxiety   . Arteriosclerosis of coronary artery 11/18/2012   Overview:  PATENT LIMA TO LAD, PATENT SVG TO PDA AND OCCULUDED SVG TO OM2 BY CATHERIZATION 02/09/2009   . 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 Borderline diabetes 05/25/2014  . BP (high blood pressure) 11/18/2012  . BPH (benign prostatic hypertrophy)   . CAFL (chronic airflow limitation) ([place]) 05/03/2015  . Cancer ([place])    renal cancer with mets  . CCF (congestive cardiac failure) ([place]) 01/03/2014   Overview:  HX OF   . COPD (chronic obstructive pulmonary disease) ([place])   . DDD (degenerative disc disease), lumbar 06/29/2014  . 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 Gastric ulcer 03/09/2014  . 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 History of urinary self-catheterization 2017  . HLD (hyperlipidemia) 01/03/2014  . Incomplete bladder  emptying 11/18/2012  . Leg weakness 11/18/2012  . Lumbar radiculitis   . Neuritis or radiculitis due to rupture of lumbar intervertebral disc 06/29/2014  . Overactive bladder   . PONV (postoperative nausea and vomiting)    years ago with Ether, no problem with Nausea or vomiting with the last few surgerys  . Pre-diabetes   . Subacute transverse myelitis ([place]) 11/21/2012  . Teeth problem    pt reports "bad teeth", "need to be pulled"    SURGICAL HISTORY: Past Surgical History:  Procedure Laterality Date  . ARTERY BIOPSY Right 12/16/2015   Procedure: BIOPSY TEMPORAL ARTERY;  Surgeon: Algernon Huxley, MD;  Location: [facility];  Service: Vascular;  Laterality: Right;  . CARDIAC CATHETERIZATION    . CATARACT EXTRACTION W/PHACO Left 01/26/2016   Procedure: CATARACT EXTRACTION PHACO AND INTRAOCULAR LENS PLACEMENT (IOC) LEFT EYE;  Surgeon: Leandrew Koyanagi, MD;  Location: [facility];  Service: Ophthalmology;  Laterality: Left;  . CORONARY ARTERY BYPASS GRAFT  04/07/2002   [facility]  . Cysto Bladder Botox Injection  07/02/2014  . CYSTOSCOPY W/ RETROGRADES Right 03/16/2017   Procedure: CYSTOSCOPY WITH RETROGRADE PYELOGRAM;  Surgeon: [name], MD;  Location: [facility];  Service: Urology;  Laterality: Right;  . CYSTOSCOPY WITH STENT PLACEMENT Right 03/16/2017   Procedure: CYSTOSCOPY WITH STENT PLACEMENT;  Surgeon: [name], MD;  Location: [facility];  Service: Urology;  Laterality: Right;  . EYE SURGERY Left    blood clot behind left eye  .  HAND SURGERY Bilateral   . HERNIA REPAIR Right    inguinal hernia repair  . LEFT HEART CATH AND CORS/GRAFTS ANGIOGRAPHY N/A 04/25/2017   Procedure: Left Heart Cath and Cors/Grafts Angiography;  Surgeon: Isaias Cowman, MD;  Location: [facility];  Service: Cardiovascular;  Laterality: N/A;  . PORTA CATH INSERTION N/A 05/30/2017   Procedure: Glori Luis Cath Insertion;  Surgeon: Katha Cabal, MD;  Location: [facility];   Service: Cardiovascular;  Laterality: N/A;  . ROBOT ASSITED LAPAROSCOPIC NEPHROURETERECTOMY Right 05/02/2017   Procedure: ROBOT ASSITED LAPAROSCOPIC NEPHROURETERECTOMY ATTEMPTED;  Surgeon: [name], MD;  Location: [facility];  Service: Urology;  Laterality: Right;  . URETEROSCOPY Right 03/16/2017   Procedure: URETEROSCOPY BIOPSY RENAL MASS;  Surgeon: [name], MD;  Location: [facility];  Service: Urology;  Laterality: Right;    SOCIAL HISTORY: Social History   Socioeconomic History  . Marital status: Married    Spouse name: Not on file  . Number of children: Not on file  . Years of education: Not on file  . Highest education level: Not on file  Social Needs  . Financial resource strain: Not on file  . Food insecurity - worry: Not on file  . Food insecurity - inability: Not on file  . Transportation needs - medical: Not on file  . Transportation needs - non-medical: Not on file  Occupational History  . Not on file  Tobacco Use  . Smoking status: Former Smoker    Types: Cigarettes    Last attempt to quit: 04/07/2002    Years since quitting: 15.5  . Smokeless tobacco: Former Systems developer    Types: Snuff, Chew  Substance and Sexual Activity  . Alcohol use: No    Alcohol/week: 0.0 oz  . Drug use: No  . Sexual activity: Not on file  Other Topics Concern  . Not on file  Social History Narrative  . Not on file    FAMILY HISTORY Family History  Problem Relation Age of Onset  . Heart attack Mother   . Heart disease Father   . Prostate cancer Neg Hx   . Bladder Cancer Neg Hx   . Kidney cancer Neg Hx     ALLERGIES:  has No Known Allergies.  MEDICATIONS:  Current Outpatient Medications  Medication Sig Dispense Refill  . apixaban (ELIQUIS) 5 MG TABS tablet Take 1 tablet (5 mg total) by mouth 2 (two) times daily. 60 tablet 0  . buPROPion (WELLBUTRIN SR) 100 MG 12 hr tablet TAKE ONE TABLET BY MOUTH EVERY DAY    . cyclobenzaprine (FLEXERIL) 10 MG tablet Take 10 mg by  mouth at bedtime as needed for muscle spasms.    Marland Kitchen ezetimibe-simvastatin (VYTORIN) 10-40 MG tablet Take 1 tablet by mouth at bedtime.    . feeding supplement, ENSURE ENLIVE, (ENSURE ENLIVE) LIQD Take 237 mLs by mouth 2 (two) times daily between meals. 60 Bottle 0  . 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    Marland Kitchen ketoconazole (NIZORAL) 2 % shampoo APPLY SHAMPOO TO HAIR AS NEEDED FOR FLAKY/ITCHY SCALP (TYPICALLY EVERY 2-3 DAYS)    . levothyroxine (SYNTHROID, LEVOTHROID) 137 MCG tablet Take 1 tablet (137 mcg total) by mouth daily before breakfast. 30 tablet 0  . lidocaine (LIDODERM) 5 % Place 1 patch onto the skin every 12 (twelve) hours. Remove & Discard patch within 12 hours or as  directed by MD 60 patch 11  . LORazepam (ATIVAN) 0.5 MG tablet Take 1 tablet (0.5 mg total) by mouth every 8 (eight) hours as needed for anxiety (nausea). (Patient not taking: Reported on 09/26/2017) 30 tablet 0  . morphine (MS CONTIN) 30 MG 12 hr tablet Take 2 tablets (60 mg total) by mouth every 12 (twelve) hours. 120 tablet 0  . morphine (MSIR) 15 MG tablet Take 1 tablet (15 mg total) by mouth every 6 (six) hours as needed for moderate pain or severe pain. 120 tablet 0  . Multiple Vitamin (MULTIVITAMIN WITH MINERALS) TABS tablet Take 1 tablet by mouth daily after breakfast. CENTRUM SILVER    . nystatin (MYCOSTATIN) 100000 UNIT/ML suspension   2  . ondansetron (ZOFRAN) 8 MG tablet TAKE 1 TAB BY MOUTH 2 TIMES DAILY AS NEEDED FOR REFRACTORY NAUSEA/VOMITING. START ON DAY 3 AFTER CHEMO 30 tablet 1  . PEPPERMINT OIL PO Take 1 capsule by mouth 3 (three) times daily as needed (for IBS). IBgard (active ingredient: 90 mg ultrapurified peppermint oil)    . polyethylene glycol powder (GLYCOLAX/MIRALAX) powder Take 17 g by mouth daily after breakfast. Mix with glass of water 255 g 1  . predniSONE (DELTASONE) 10 MG tablet  Take 4 tablets (40 mg total) by mouth daily with breakfast. 28 tablet 0  . promethazine (PHENERGAN) 25 MG suppository Place 1 suppository (25 mg total) rectally every 6 (six) hours as needed for nausea. (Patient not taking: Reported on 09/26/2017) 30 suppository 0  . promethazine (PHENERGAN) 25 MG tablet Take 1 tablet (25 mg total) by mouth every 6 (six) hours as needed for nausea or vomiting. (Patient not taking: Reported on 09/26/2017) 30 tablet 0  . RABEprazole (ACIPHEX) 20 MG tablet Take 20 mg by mouth daily before breakfast.     . SENNA LAX 8.6 MG tablet TAKE TWO TABLETS BY MOUTH DAILY 120 tablet 3  . sertraline (ZOLOFT) 100 MG tablet Take 100 mg by mouth daily after breakfast.    . sodium phosphate (FLEET) 7-19 GM/118ML ENEM Place 133 mLs (1 enema total) rectally daily as needed for severe constipation. (Patient not taking: Reported on 09/26/2017) 5 enema 0  . tamsulosin (FLOMAX) 0.4 MG CAPS capsule Take 0.8 mg by mouth daily after supper.      No current facility-administered medications for this visit.    Facility-Administered Medications Ordered in Other Visits  Medication Dose Route Frequency Provider Last Rate Last Dose  . heparin lock flush 100 unit/mL  500 Units Intravenous Once Earlie Server, MD      . sodium chloride flush (NS) 0.9 % injection 10 mL  10 mL Intravenous PRN Earlie Server, MD   10 mL at 07/03/17 0930    PHYSICAL EXAMINATION:  ECOG PERFORMANCE STATUS: 2 - Symptomatic, <50% confined to bed  Vitals:   10/10/17 1520  BP: 123/77  Pulse: (!) 124  Resp: 20  Temp: (!) 96.4 F (35.8 C)  SpO2: 99%    Filed Weights   10/10/17 1520  Weight: 135 lb (61.2 kg)     Physical Exam  Constitutional: He is oriented to person, place, and time. No distress.  HENT:  Head: Normocephalic and atraumatic.  Mouth/Throat: Oropharynx is clear and moist. No oropharyngeal exudate.  Eyes: Conjunctivae and EOM are normal. Pupils are equal, round, and reactive to light. Left eye exhibits no  discharge. No scleral icterus.  Neck: Normal range of motion. Neck supple. No JVD present. No tracheal deviation present.  Cardiovascular: Regular rhythm  and normal heart sounds. Exam reveals no friction rub.  No murmur heard. tachycardia  Pulmonary/Chest: No stridor. No respiratory distress. He has no wheezes. He has no rales.  Abdominal: He exhibits no distension. There is no tenderness. There is no rebound.  Genitourinary:  Genitourinary Comments: Foley catheter.   Musculoskeletal: Normal range of motion. He exhibits no edema.  Palpable spinal tenderness.  Lymphadenopathy:    He has no cervical adenopathy.  Neurological: He is alert and oriented to person, place, and time. No cranial nerve deficit. Coordination normal.  Skin: Skin is warm. No rash noted. He is not diaphoretic. No erythema.  Psychiatric: Affect normal.    ECOG 2  LABORATORY DATA: I have personally reviewed the data as listed: CBC    Component Value Date/Time   WBC 12.3 (H) 10/03/2017 1425   RBC 3.60 (L) 10/03/2017 1425   HGB 9.6 (L) 10/03/2017 1425   HGB 15.3 11/15/2012 1641   HCT 30.7 (L) 10/03/2017 1425   HCT 43.7 11/15/2012 1641   PLT 236 10/03/2017 1425   PLT 166 11/15/2012 1641   MCV 85.1 10/03/2017 1425   MCV 89 11/15/2012 1641   MCH 26.7 10/03/2017 1425   MCHC 31.4 (L) 10/03/2017 1425   RDW 19.2 (H) 10/03/2017 1425   RDW 13.3 11/15/2012 1641   LYMPHSABS 0.6 (L) 10/03/2017 1425   LYMPHSABS 1.7 11/15/2012 1641   MONOABS 0.7 10/03/2017 1425   MONOABS 0.8 11/15/2012 1641   EOSABS 0.1 10/03/2017 1425   EOSABS 0.1 11/15/2012 1641   BASOSABS 0.0 10/03/2017 1425   BASOSABS 0.0 11/15/2012 1641   CMP Latest Ref Rng & Units 10/03/2017 09/26/2017 09/12/2017  Glucose 65 - 99 mg/dL 146(H) 141(H) 151(H)  BUN 6 - 20 mg/dL '11 18 18  '$ Creatinine 0.61 - 1.24 mg/dL 0.74 1.09 0.99  Sodium 135 - 145 mmol/L 129(L) 126(L) 135  Potassium 3.5 - 5.1 mmol/L 4.7 5.2(H) 4.4  Chloride 101 - 111 mmol/L 91(L) 91(L) 102  CO2  22 - 32 mmol/L '25 23 23  '$ Calcium 8.9 - 10.3 mg/dL 8.7(L) 8.4(L) 7.9(L)  Total Protein 6.5 - 8.1 g/dL 6.9 7.0 6.1(L)  Total Bilirubin 0.3 - 1.2 mg/dL 0.5 0.6 0.4  Alkaline Phos 38 - 126 U/L 321(H) 269(H) 199(H)  AST 15 - 41 U/L 66(H) 50(H) 58(H)  ALT 17 - 63 U/L 185(H) 135(H) 165(H)     RADIOGRAPHIC STUDIES: I have personally reviewed the radiological images as listed and agree with the findings in the report 5.  Aortic Atherosclerosis (ICD10-I70.0).  Coronary atherosclerosis. 6. Inflammatory stranding from the right perirenal space extends down into the pelvis and crosses the midline. There is presacral edema. 7. 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 Recommend followup by ultrasound in 3 years. This recommendation follows ACR consensus guidelines: White Paper of the ACR Incidental Findings Committee II on Vascular Findings. J Am Coll Radiol 2013; 82:500-370 8. Wall thickening along the left side of the urinary bladder may be incidental, but synchronous sessile transitional cell carcinoma is not readily excluded.  CT abdomen wo contrast 02/09/2017  Increased size of 4.6 cm ill-defined hypoenhancing mass ininterpolar region of right kidney, with obstruction of upper pole collecting system. This does not have typical appearance for pyelonephritis or renal cell carcinoma, and raises suspicion for urothelial carcinoma. Consider ureteroscopy for further evaluation. No evidence of metastatic disease. Stable mildly enlarged prostate and mild diffuse bladder wall thickening. Stable 3.0 cm infrarenal abdominal aortic aneurysm. Recommend followup by ultrasound in 3 years.  This recommendation follows ACR consensus guidelines: White Paper of the ACR Incidental Findings Committee II on Vascular Findings. [place] [phone]. Colonic diverticulosis. No radiographic evidence of diverticulitis. Stable small fat-containing left inguinal hernia. MRI thoracic and  lumbar w/wo contrast.  1. Malignant invasion of the L2 vertebral body by the adjacent retroperitoneal mass. No pathologic fracture or osseous metastatic disease elsewhere in the thoracolumbar spine. 2. Areas of epidural contrast enhancement of the lumbar spine are most consistent with a dilated epidural venous plexus secondary to invasion of the inferior vena cava by the patient's urothelial tumor. 3. Moderate L4-L5 multifactorial spinal canal stenosis with severe right and mild-to-moderate left neural foraminal stenosis. 4. Moderate right and severe left multifactorial L5-S1 neural foraminal stenosis. There is also severe attenuation of the thecal sac at this level but no bony spinal canal stenosis. 5. No spinal canal or neural foraminal stenosis in the thoracic spine.   10/01/2017 CT abdomen and pelvis with contrast showed multiple new hepatic metastasis, right renal cortical and hilar mass is similar, similar findings of involvement of the IVC in the right renal vein. Skeletal metastasis at L2, probable new skeletal metastasis at T10. 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 Transitional cell carcinoma of kidney, right ([place])   3. Goals of care, counseling/discussion   4. Transaminitis   5. Hypothyroidism, unspecified type   6. Hyponatremia    # Hold immunotherapy due to Elevated LFTs. He has only got one dose of Tecentriq and his liver enzymes increased. He has been started on steroids  60 mg which is slowly being tapered down.For the past 2 weeks he has been on 40 mg and he is LFT has not further improved. Patient's CT also showed multiple liver  metastases which can also contribute to his elevated LFT. Bilirubin is so far normal. Discussed with patient that most likely the elevation of LFT is secondary to multiple liver metastasis. Immunotherapy would not be able to be rechallenged due to elevated LFT and his steroid has not been fully tapered down yet.  # goal  care was discussed with patient in detail.the patient understands that his prognosis extremely poor. He is reluctant to proceed with any additional sig line chemotherapy as he did not tolerate chemotherapy well. Hospice has been previously discussed and patient wants to continue his treatment.he is interested in seeking second opinion and I will refer him to see Dr. Aline Brochure at [facility] to see if clinical trial options are available  # His hypotension has improved after blood pressure medication was held.today his blood pressure stable. I advised patient to continue hold antihypertensive medication..  # Current patient medication regimen is Morphin IR '15mg'$  Q6h PRN for pain in combination with MS contin '60mg'$  BID. Reports he has enough supply at home. # Xgeva Q28 days last given on 10/01/2017,   #Hypothyroidism on Synthroid 160mg daily.subjectively patient reports feeling less fatigued. # Will continue slowly tapering his steroids to 30 mg daily for the next 2 weeks. # Hyponatremia likely secondary to assess her water intake. Advised patient to cut down free water intake and also will start him on salt tablets 1 g twice a day.  Follow up in 2 weeks to repeat labs and further taper steroid course.   Earlie Server, MD, PhD Hematology Oncology [facility] Pager- [phone] 10/10/2017

## 2017-10-10 ENCOUNTER — Encounter: Payer: Self-pay | Admitting: Oncology

## 2017-10-10 ENCOUNTER — Inpatient Hospital Stay: Payer: Medicare Other

## 2017-10-10 ENCOUNTER — Inpatient Hospital Stay (HOSPITAL_BASED_OUTPATIENT_CLINIC_OR_DEPARTMENT_OTHER): Payer: Medicare Other | Admitting: Oncology

## 2017-10-10 VITALS — BP 123/77 | HR 124 | Temp 96.4°F | Resp 20 | Wt 135.0 lb

## 2017-10-10 DIAGNOSIS — E039 Hypothyroidism, unspecified: Secondary | ICD-10-CM

## 2017-10-10 DIAGNOSIS — Z7189 Other specified counseling: Secondary | ICD-10-CM

## 2017-10-10 DIAGNOSIS — R740 Nonspecific elevation of levels of transaminase and lactic acid dehydrogenase [LDH]: Secondary | ICD-10-CM | POA: Diagnosis not present

## 2017-10-10 DIAGNOSIS — R7401 Elevation of levels of liver transaminase levels: Secondary | ICD-10-CM

## 2017-10-10 DIAGNOSIS — E871 Hypo-osmolality and hyponatremia: Secondary | ICD-10-CM

## 2017-10-10 DIAGNOSIS — C7951 Secondary malignant neoplasm of bone: Secondary | ICD-10-CM

## 2017-10-10 DIAGNOSIS — C641 Malignant neoplasm of right kidney, except renal pelvis: Secondary | ICD-10-CM

## 2017-10-10 LAB — CBC WITH DIFFERENTIAL/PLATELET
BASOS ABS: 0.1 10*3/uL (ref 0–0.1)
BASOS PCT: 0 %
EOS ABS: 0.1 10*3/uL (ref 0–0.7)
Eosinophils Relative: 0 %
HCT: 32.7 % — ABNORMAL LOW (ref 40.0–52.0)
HEMOGLOBIN: 10.4 g/dL — AB (ref 13.0–18.0)
Lymphocytes Relative: 2 %
Lymphs Abs: 0.3 10*3/uL — ABNORMAL LOW (ref 1.0–3.6)
MCH: 26.2 pg (ref 26.0–34.0)
MCHC: 31.7 g/dL — AB (ref 32.0–36.0)
MCV: 82.5 fL (ref 80.0–100.0)
MONOS PCT: 3 %
Monocytes Absolute: 0.5 10*3/uL (ref 0.2–1.0)
NEUTROS ABS: 12.6 10*3/uL — AB (ref 1.4–6.5)
NEUTROS PCT: 95 %
Platelets: 195 10*3/uL (ref 150–440)
RBC: 3.97 MIL/uL — ABNORMAL LOW (ref 4.40–5.90)
RDW: 19.6 % — AB (ref 11.5–14.5)
WBC: 13.4 10*3/uL — AB (ref 3.8–10.6)

## 2017-10-10 LAB — COMPREHENSIVE METABOLIC PANEL
ALT: 142 U/L — AB (ref 17–63)
ANION GAP: 12 (ref 5–15)
AST: 70 U/L — ABNORMAL HIGH (ref 15–41)
Albumin: 2.1 g/dL — ABNORMAL LOW (ref 3.5–5.0)
Alkaline Phosphatase: 337 U/L — ABNORMAL HIGH (ref 38–126)
BILIRUBIN TOTAL: 0.4 mg/dL (ref 0.3–1.2)
BUN: 15 mg/dL (ref 6–20)
CALCIUM: 8.2 mg/dL — AB (ref 8.9–10.3)
CO2: 24 mmol/L (ref 22–32)
CREATININE: 0.78 mg/dL (ref 0.61–1.24)
Chloride: 88 mmol/L — ABNORMAL LOW (ref 101–111)
Glucose, Bld: 255 mg/dL — ABNORMAL HIGH (ref 65–99)
Potassium: 4.6 mmol/L (ref 3.5–5.1)
Sodium: 124 mmol/L — ABNORMAL LOW (ref 135–145)
TOTAL PROTEIN: 6.7 g/dL (ref 6.5–8.1)

## 2017-10-10 MED ORDER — PREDNISONE 10 MG PO TABS: 30.0000 mg | ORAL_TABLET | Freq: Every day | ORAL | 0 refills | Status: DC

## 2017-10-10 MED ORDER — SODIUM CHLORIDE 1 G PO TABS: 1.0000 g | ORAL_TABLET | Freq: Two times a day (BID) | ORAL | 0 refills | Status: AC

## 2017-10-20 ENCOUNTER — Encounter: Payer: Self-pay | Admitting: Emergency Medicine

## 2017-10-20 ENCOUNTER — Other Ambulatory Visit: Payer: Self-pay

## 2017-10-20 ENCOUNTER — Inpatient Hospital Stay
Admission: EM | Admit: 2017-10-20 | Discharge: 2017-10-24 | DRG: 699 | Disposition: A | Discharge status: Home / Self Care | Payer: Medicare Other | Attending: Internal Medicine | Admitting: Internal Medicine

## 2017-10-20 DIAGNOSIS — Z66 Do not resuscitate: Secondary | ICD-10-CM | POA: Diagnosis present

## 2017-10-20 DIAGNOSIS — Z87891 Personal history of nicotine dependence: Secondary | ICD-10-CM | POA: Diagnosis not present

## 2017-10-20 DIAGNOSIS — I248 Other forms of acute ischemic heart disease: Secondary | ICD-10-CM | POA: Diagnosis present

## 2017-10-20 DIAGNOSIS — Z9221 Personal history of antineoplastic chemotherapy: Secondary | ICD-10-CM | POA: Diagnosis not present

## 2017-10-20 DIAGNOSIS — T83511A Infection and inflammatory reaction due to indwelling urethral catheter, initial encounter: Principal | ICD-10-CM | POA: Diagnosis present

## 2017-10-20 DIAGNOSIS — R531 Weakness: Secondary | ICD-10-CM

## 2017-10-20 DIAGNOSIS — K219 Gastro-esophageal reflux disease without esophagitis: Secondary | ICD-10-CM | POA: Diagnosis present

## 2017-10-20 DIAGNOSIS — D72825 Bandemia: Secondary | ICD-10-CM | POA: Diagnosis present

## 2017-10-20 DIAGNOSIS — I130 Hypertensive heart and chronic kidney disease with heart failure and stage 1 through stage 4 chronic kidney disease, or unspecified chronic kidney disease: Secondary | ICD-10-CM | POA: Diagnosis present

## 2017-10-20 DIAGNOSIS — R066 Hiccough: Secondary | ICD-10-CM

## 2017-10-20 DIAGNOSIS — C641 Malignant neoplasm of right kidney, except renal pelvis: Secondary | ICD-10-CM

## 2017-10-20 DIAGNOSIS — C787 Secondary malignant neoplasm of liver and intrahepatic bile duct: Secondary | ICD-10-CM | POA: Diagnosis present

## 2017-10-20 DIAGNOSIS — Z7189 Other specified counseling: Secondary | ICD-10-CM

## 2017-10-20 DIAGNOSIS — Z87440 Personal history of urinary (tract) infections: Secondary | ICD-10-CM

## 2017-10-20 DIAGNOSIS — Z8711 Personal history of peptic ulcer disease: Secondary | ICD-10-CM

## 2017-10-20 DIAGNOSIS — I10 Essential (primary) hypertension: Secondary | ICD-10-CM | POA: Diagnosis present

## 2017-10-20 DIAGNOSIS — E039 Hypothyroidism, unspecified: Secondary | ICD-10-CM | POA: Diagnosis present

## 2017-10-20 DIAGNOSIS — Z923 Personal history of irradiation: Secondary | ICD-10-CM | POA: Diagnosis not present

## 2017-10-20 DIAGNOSIS — Z961 Presence of intraocular lens: Secondary | ICD-10-CM | POA: Diagnosis present

## 2017-10-20 DIAGNOSIS — N400 Enlarged prostate without lower urinary tract symptoms: Secondary | ICD-10-CM | POA: Diagnosis present

## 2017-10-20 DIAGNOSIS — Z8249 Family history of ischemic heart disease and other diseases of the circulatory system: Secondary | ICD-10-CM

## 2017-10-20 DIAGNOSIS — E785 Hyperlipidemia, unspecified: Secondary | ICD-10-CM | POA: Diagnosis present

## 2017-10-20 DIAGNOSIS — N183 Chronic kidney disease, stage 3 unspecified: Secondary | ICD-10-CM | POA: Diagnosis present

## 2017-10-20 DIAGNOSIS — E871 Hypo-osmolality and hyponatremia: Secondary | ICD-10-CM

## 2017-10-20 DIAGNOSIS — Z9842 Cataract extraction status, left eye: Secondary | ICD-10-CM

## 2017-10-20 DIAGNOSIS — I823 Embolism and thrombosis of renal vein: Secondary | ICD-10-CM | POA: Diagnosis not present

## 2017-10-20 DIAGNOSIS — I2510 Atherosclerotic heart disease of native coronary artery without angina pectoris: Secondary | ICD-10-CM | POA: Diagnosis present

## 2017-10-20 DIAGNOSIS — J449 Chronic obstructive pulmonary disease, unspecified: Secondary | ICD-10-CM | POA: Diagnosis present

## 2017-10-20 DIAGNOSIS — Y738 Miscellaneous gastroenterology and urology devices associated with adverse incidents, not elsewhere classified: Secondary | ICD-10-CM | POA: Diagnosis present

## 2017-10-20 DIAGNOSIS — E222 Syndrome of inappropriate secretion of antidiuretic hormone: Secondary | ICD-10-CM | POA: Diagnosis present

## 2017-10-20 DIAGNOSIS — N390 Urinary tract infection, site not specified: Secondary | ICD-10-CM

## 2017-10-20 DIAGNOSIS — T451X5A Adverse effect of antineoplastic and immunosuppressive drugs, initial encounter: Secondary | ICD-10-CM

## 2017-10-20 DIAGNOSIS — Z79890 Hormone replacement therapy (postmenopausal): Secondary | ICD-10-CM

## 2017-10-20 DIAGNOSIS — R740 Nonspecific elevation of levels of transaminase and lactic acid dehydrogenase [LDH]: Secondary | ICD-10-CM | POA: Diagnosis not present

## 2017-10-20 DIAGNOSIS — I509 Heart failure, unspecified: Secondary | ICD-10-CM | POA: Diagnosis present

## 2017-10-20 DIAGNOSIS — N3281 Overactive bladder: Secondary | ICD-10-CM | POA: Diagnosis present

## 2017-10-20 DIAGNOSIS — Z515 Encounter for palliative care: Secondary | ICD-10-CM | POA: Diagnosis present

## 2017-10-20 DIAGNOSIS — Z951 Presence of aortocoronary bypass graft: Secondary | ICD-10-CM

## 2017-10-20 DIAGNOSIS — C7951 Secondary malignant neoplasm of bone: Secondary | ICD-10-CM

## 2017-10-20 DIAGNOSIS — D6959 Other secondary thrombocytopenia: Secondary | ICD-10-CM

## 2017-10-20 LAB — COMPREHENSIVE METABOLIC PANEL
ALBUMIN: 1.9 g/dL — AB (ref 3.5–5.0)
ALK PHOS: 341 U/L — AB (ref 38–126)
ALT: 82 U/L — AB (ref 17–63)
AST: 38 U/L (ref 15–41)
Anion gap: 10 (ref 5–15)
BILIRUBIN TOTAL: 0.4 mg/dL (ref 0.3–1.2)
BUN: 10 mg/dL (ref 6–20)
CALCIUM: 7.9 mg/dL — AB (ref 8.9–10.3)
CO2: 26 mmol/L (ref 22–32)
CREATININE: 0.56 mg/dL — AB (ref 0.61–1.24)
Chloride: 92 mmol/L — ABNORMAL LOW (ref 101–111)
GFR calc Af Amer: >60 mL/min (ref >60)
GFR calc non Af Amer: >60 mL/min (ref >60)
GLUCOSE: 252 mg/dL — AB (ref 65–99)
POTASSIUM: 4.4 mmol/L (ref 3.5–5.1)
Sodium: 128 mmol/L — ABNORMAL LOW (ref 135–145)
TOTAL PROTEIN: 5.8 g/dL — AB (ref 6.5–8.1)

## 2017-10-20 LAB — URINALYSIS, COMPLETE (UACMP) WITH MICROSCOPIC
BILIRUBIN URINE: NEGATIVE
Glucose, UA: >=500 mg/dL — AB
Ketones, ur: NEGATIVE mg/dL
Nitrite: NEGATIVE
Protein, ur: NEGATIVE mg/dL
SQUAMOUS EPITHELIAL / LPF: NONE SEEN
Specific Gravity, Urine: 1.008 (ref 1.005–1.030)
pH: 6 (ref 5.0–8.0)

## 2017-10-20 LAB — CBC WITH DIFFERENTIAL/PLATELET
BASOS ABS: 0 10*3/uL (ref 0–0.1)
Basophils Relative: 0 %
Eosinophils Absolute: 0 10*3/uL (ref 0–0.7)
Eosinophils Relative: 0 %
HEMATOCRIT: 30.3 % — AB (ref 40.0–52.0)
HEMOGLOBIN: 9.4 g/dL — AB (ref 13.0–18.0)
LYMPHS PCT: 2 %
Lymphs Abs: 0.2 10*3/uL — ABNORMAL LOW (ref 1.0–3.6)
MCH: 25.1 pg — ABNORMAL LOW (ref 26.0–34.0)
MCHC: 31 g/dL — ABNORMAL LOW (ref 32.0–36.0)
MCV: 80.9 fL (ref 80.0–100.0)
Monocytes Absolute: 0.4 10*3/uL (ref 0.2–1.0)
Monocytes Relative: 3 %
NEUTROS ABS: 10.6 10*3/uL — AB (ref 1.4–6.5)
NEUTROS PCT: 95 %
Platelets: 110 10*3/uL — ABNORMAL LOW (ref 150–440)
RBC: 3.74 MIL/uL — AB (ref 4.40–5.90)
RDW: 19.7 % — ABNORMAL HIGH (ref 11.5–14.5)
WBC: 11.1 10*3/uL — ABNORMAL HIGH (ref 3.8–10.6)

## 2017-10-20 LAB — TROPONIN I: TROPONIN I: 0.04 ng/mL — AB (ref <0.03)

## 2017-10-20 MED ORDER — MORPHINE SULFATE 15 MG PO TABS
15.0000 mg | ORAL_TABLET | Freq: Four times a day (QID) | ORAL | Status: DC | PRN
  Administered 2017-10-21 (×2): 15 mg via ORAL
  Filled 2017-10-20 (×2): qty 1

## 2017-10-20 MED ORDER — PROMETHAZINE HCL 25 MG/ML IJ SOLN: 12.5000 mg | Freq: Four times a day (QID) | INTRAMUSCULAR | Status: DC | PRN

## 2017-10-20 MED ORDER — EZETIMIBE 10 MG PO TABS
10.0000 mg | ORAL_TABLET | Freq: Every day | ORAL | Status: DC
  Administered 2017-10-20 – 2017-10-23 (×4): 10 mg via ORAL
  Filled 2017-10-20 (×4): qty 1

## 2017-10-20 MED ORDER — APIXABAN 5 MG PO TABS
5.0000 mg | ORAL_TABLET | Freq: Two times a day (BID) | ORAL | Status: DC
  Administered 2017-10-20 – 2017-10-24 (×8): 5 mg via ORAL
  Filled 2017-10-20 (×8): qty 1

## 2017-10-20 MED ORDER — SERTRALINE HCL 50 MG PO TABS
100.0000 mg | ORAL_TABLET | Freq: Every day | ORAL | Status: DC
  Administered 2017-10-21 – 2017-10-24 (×4): 100 mg via ORAL
  Filled 2017-10-20 (×4): qty 2

## 2017-10-20 MED ORDER — DEXTROSE 5 % IV SOLN
1.0000 g | INTRAVENOUS | Status: DC
  Administered 2017-10-21 – 2017-10-22 (×2): 1 g via INTRAVENOUS
  Filled 2017-10-20 (×3): qty 10

## 2017-10-20 MED ORDER — BUPROPION HCL ER (SR) 100 MG PO TB12
100.0000 mg | ORAL_TABLET | Freq: Every day | ORAL | Status: DC
  Administered 2017-10-21 – 2017-10-24 (×4): 100 mg via ORAL
  Filled 2017-10-20 (×4): qty 1

## 2017-10-20 MED ORDER — CEFTRIAXONE SODIUM IN DEXTROSE 20 MG/ML IV SOLN: 1.0000 g | INTRAVENOUS | Status: DC

## 2017-10-20 MED ORDER — PANTOPRAZOLE SODIUM 40 MG PO TBEC
40.0000 mg | DELAYED_RELEASE_TABLET | Freq: Every day | ORAL | Status: DC
  Administered 2017-10-21 – 2017-10-24 (×4): 40 mg via ORAL
  Filled 2017-10-20 (×4): qty 1

## 2017-10-20 MED ORDER — SODIUM CHLORIDE 0.9 % IV SOLN: 8.0000 mg | Freq: Three times a day (TID) | INTRAVENOUS | Status: DC | PRN

## 2017-10-20 MED ORDER — EZETIMIBE-SIMVASTATIN 10-40 MG PO TABS: 1.0000 | ORAL_TABLET | Freq: Every day | ORAL | Status: DC

## 2017-10-20 MED ORDER — TAMSULOSIN HCL 0.4 MG PO CAPS
0.8000 mg | ORAL_CAPSULE | Freq: Every day | ORAL | Status: DC
  Administered 2017-10-21 – 2017-10-23 (×3): 0.8 mg via ORAL
  Filled 2017-10-20 (×3): qty 2

## 2017-10-20 MED ORDER — ONDANSETRON HCL 4 MG PO TABS: 8.0000 mg | ORAL_TABLET | Freq: Three times a day (TID) | ORAL | Status: DC | PRN

## 2017-10-20 MED ORDER — SIMVASTATIN 20 MG PO TABS
40.0000 mg | ORAL_TABLET | Freq: Every day | ORAL | Status: DC
  Administered 2017-10-20 – 2017-10-23 (×4): 40 mg via ORAL
  Filled 2017-10-20 (×4): qty 2

## 2017-10-20 MED ORDER — MORPHINE SULFATE ER 30 MG PO TBCR
60.0000 mg | EXTENDED_RELEASE_TABLET | Freq: Two times a day (BID) | ORAL | Status: DC
  Administered 2017-10-20 – 2017-10-24 (×8): 60 mg via ORAL
  Filled 2017-10-20 (×8): qty 2

## 2017-10-20 MED ORDER — SENNA 8.6 MG PO TABS
2.0000 | ORAL_TABLET | Freq: Every day | ORAL | Status: DC
  Administered 2017-10-21 – 2017-10-24 (×4): 17.2 mg via ORAL
  Filled 2017-10-20 (×4): qty 2

## 2017-10-20 MED ORDER — ACETAMINOPHEN 325 MG PO TABS: 650.0000 mg | ORAL_TABLET | Freq: Four times a day (QID) | ORAL | Status: DC | PRN

## 2017-10-20 MED ORDER — CYCLOBENZAPRINE HCL 10 MG PO TABS: 10.0000 mg | ORAL_TABLET | Freq: Every evening | ORAL | Status: DC | PRN

## 2017-10-20 MED ORDER — SODIUM CHLORIDE 0.9 % IV SOLN
INTRAVENOUS | Status: AC
  Administered 2017-10-20: via INTRAVENOUS

## 2017-10-20 MED ORDER — HYOSCYAMINE SULFATE 0.125 MG PO TBDP
0.1250 mg | ORAL_TABLET | Freq: Four times a day (QID) | ORAL | Status: DC | PRN
  Filled 2017-10-20: qty 1

## 2017-10-20 MED ORDER — ACETAMINOPHEN 650 MG RE SUPP: 650.0000 mg | Freq: Four times a day (QID) | RECTAL | Status: DC | PRN

## 2017-10-20 MED ORDER — LEVOTHYROXINE SODIUM 137 MCG PO TABS
137.0000 ug | ORAL_TABLET | Freq: Every day | ORAL | Status: DC
  Administered 2017-10-21 – 2017-10-24 (×4): 137 ug via ORAL
  Filled 2017-10-20 (×4): qty 1

## 2017-10-20 MED ORDER — CEFTRIAXONE SODIUM IN DEXTROSE 20 MG/ML IV SOLN
1.0000 g | Freq: Once | INTRAVENOUS | Status: AC
  Administered 2017-10-20: 1 g via INTRAVENOUS
  Filled 2017-10-20: qty 50

## 2017-10-20 MED ORDER — SENNOSIDES 8.6 MG PO TABS: 2.0000 | ORAL_TABLET | Freq: Every day | ORAL | Status: DC

## 2017-10-20 NOTE — ED Triage Notes (Signed)
Pt to ED via [facility] from home. Pt has hx/o Renal cancer, pt has progressively gotten weaker, pt is unable to sit up on his own, pt has had decreased mobility and decreased PO intake, family is wishing to discuss end of life care. Pt denies any pain at this time. VSS with EMS. Pt is in NAD at this time.

## 2017-10-20 NOTE — H&P (Signed)
[facility] [place] NAME: Joseph Ritter    MR#:  [id number]  DATE OF BIRTH:  14-Mar-1941  DATE OF ADMISSION:  10/20/2017  PRIMARY CARE PHYSICIAN: [name], MD   REQUESTING/REFERRING PHYSICIAN: Archie Balboa, MD  CHIEF COMPLAINT:   Chief Complaint  Patient presents with  . Weakness    HISTORY OF PRESENT ILLNESS:  Joseph Ritter  is a 77 y.o. male who presents with increasing weakness and lower abdominal pain.  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  He is still seeking treatment for this and is currently in the process of seeking a second opinion.  Due to cancer and his progressive debilitation he has had an indwelling Foley catheter for the past several months.  He has been exchanging this once a month with urology.  Over the past week or so his weakness has progressed to the point that he is no longer able to stand up and walk around his house.  Here in the ED tonight he is found to have bandemia, and UA very suspicious for urinary infection.  He has had a number of urinary infections in his recent history.  Hospitalists were called for admission.  PAST MEDICAL HISTORY:   Past Medical History:  Diagnosis Date  . Acid indigestion 03/09/2014  . Adult hypothyroidism 11/18/2012  . Anxiety   . Arteriosclerosis of coronary artery 11/18/2012   Overview:  PATENT LIMA TO LAD, PATENT SVG TO PDA AND OCCULUDED SVG TO OM2 BY CATHERIZATION 02/09/2009   . 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 Borderline diabetes 05/25/2014  . BP (high blood pressure) 11/18/2012  . BPH (benign prostatic hypertrophy)   . CAFL (chronic airflow limitation) ([place]) 05/03/2015  . Cancer ([place])    renal cancer with mets  . CCF (congestive cardiac failure) ([place]) 01/03/2014   Overview:  HX OF   . COPD (chronic obstructive pulmonary disease)  ([place])   . DDD (degenerative disc disease), lumbar 06/29/2014  . 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 Gastric ulcer 03/09/2014  . 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 History of urinary self-catheterization 2017  . HLD (hyperlipidemia) 01/03/2014  . Incomplete bladder emptying 11/18/2012  . Leg weakness 11/18/2012  . Lumbar radiculitis   . Neuritis or radiculitis due to rupture of lumbar intervertebral disc 06/29/2014  . Overactive bladder   . PONV (postoperative nausea and vomiting)    years ago with Ether, no problem with Nausea or vomiting with the last few surgerys  . Pre-diabetes   . 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 Teeth problem    pt reports "bad teeth", "need to be pulled"    PAST SURGICAL HISTORY:   Past Surgical History:  Procedure Laterality Date  . ARTERY BIOPSY Right 12/16/2015   Procedure: BIOPSY TEMPORAL ARTERY;  Surgeon: Algernon Huxley, MD;  Location: [facility];  Service: Vascular;  Laterality: Right;  . CARDIAC CATHETERIZATION    . CATARACT EXTRACTION W/PHACO Left 01/26/2016   Procedure: CATARACT EXTRACTION PHACO AND INTRAOCULAR LENS PLACEMENT (IOC) LEFT EYE;  Surgeon: Leandrew Koyanagi, MD;  Location: [facility];  Service: Ophthalmology;  Laterality: Left;  . CORONARY ARTERY BYPASS GRAFT  04/07/2002   [facility]  . Cysto Bladder Botox Injection  07/02/2014  .  CYSTOSCOPY W/ RETROGRADES Right 03/16/2017   Procedure: CYSTOSCOPY WITH RETROGRADE PYELOGRAM;  Surgeon: [name], MD;  Location: [facility];  Service: Urology;  Laterality: Right;  . CYSTOSCOPY WITH STENT PLACEMENT Right 03/16/2017   Procedure: CYSTOSCOPY WITH STENT PLACEMENT;  Surgeon: [name], MD;  Location: [facility];  Service: Urology;  Laterality: Right;  . EYE SURGERY Left    blood clot behind left eye  . HAND SURGERY Bilateral   . HERNIA REPAIR Right    inguinal hernia  repair  . LEFT HEART CATH AND CORS/GRAFTS ANGIOGRAPHY N/A 04/25/2017   Procedure: Left Heart Cath and Cors/Grafts Angiography;  Surgeon: Isaias Cowman, MD;  Location: [facility];  Service: Cardiovascular;  Laterality: N/A;  . PORTA CATH INSERTION N/A 05/30/2017   Procedure: Glori Luis Cath Insertion;  Surgeon: Katha Cabal, MD;  Location: [facility];  Service: Cardiovascular;  Laterality: N/A;  . ROBOT ASSITED LAPAROSCOPIC NEPHROURETERECTOMY Right 05/02/2017   Procedure: ROBOT ASSITED LAPAROSCOPIC NEPHROURETERECTOMY ATTEMPTED;  Surgeon: [name], MD;  Location: [facility];  Service: Urology;  Laterality: Right;  . URETEROSCOPY Right 03/16/2017   Procedure: URETEROSCOPY BIOPSY RENAL MASS;  Surgeon: [name], MD;  Location: [facility];  Service: Urology;  Laterality: Right;    SOCIAL HISTORY:   Social History   Tobacco Use  . Smoking status: Former Smoker    Types: Cigarettes    Last attempt to quit: 04/07/2002    Years since quitting: 15.5  . Smokeless tobacco: Former Systems developer    Types: Snuff, Chew  Substance Use Topics  . Alcohol use: No    Alcohol/week: 0.0 oz    FAMILY HISTORY:   Family History  Problem Relation Age of Onset  . Heart attack Mother   . Heart disease Father   . Prostate cancer Neg Hx   . Bladder Cancer Neg Hx   . Kidney cancer Neg Hx     DRUG ALLERGIES:  No Known Allergies  MEDICATIONS AT HOME:   Prior to Admission medications   Medication Sig Start Date End Date Taking? Authorizing Provider  apixaban (ELIQUIS) 5 MG TABS tablet Take 1 tablet (5 mg total) by mouth 2 (two) times daily. 09/26/17   Earlie Server, MD  buPROPion (WELLBUTRIN SR) 100 MG 12 hr tablet TAKE ONE TABLET BY MOUTH EVERY DAY 05/29/17   [provider]  cyclobenzaprine (FLEXERIL) 10 MG tablet Take 10 mg by mouth at bedtime as needed for muscle spasms.    [provider]  ezetimibe-simvastatin (VYTORIN) 10-40 MG tablet Take 1 tablet by mouth at  bedtime.    [provider]  feeding supplement, ENSURE ENLIVE, (ENSURE ENLIVE) LIQD Take 237 mLs by mouth 2 (two) times daily between meals. 07/10/17   Loletha Grayer, MD  hyoscyamine (ANASPAZ) 0.125 MG TBDP disintergrating tablet Place 0.125 mg under the tongue every 6 (six) hours as needed (for abdominal cramping due to IBS).    [provider]  ketoconazole (NIZORAL) 2 % shampoo APPLY SHAMPOO TO HAIR AS NEEDED FOR FLAKY/ITCHY SCALP (TYPICALLY EVERY 2-3 DAYS) 03/01/15   [provider]  levothyroxine (SYNTHROID, LEVOTHROID) 137 MCG tablet Take 1 tablet (137 mcg total) by mouth daily before breakfast. 10/04/17   Earlie Server, MD  lidocaine (LIDODERM) 5 % Place 1 patch onto the skin every 12 (twelve) hours. Remove & Discard patch within 12 hours or as directed by MD 06/25/17 06/25/18  Earlie Server, MD  LORazepam (ATIVAN) 0.5 MG tablet Take 1 tablet (  0.5 mg total) by mouth every 8 (eight) hours as needed for anxiety (nausea). Patient not taking: Reported on 09/26/2017 06/01/17   Earlie Server, MD  morphine (MS CONTIN) 30 MG 12 hr tablet Take 2 tablets (60 mg total) by mouth every 12 (twelve) hours. 09/26/17   Earlie Server, MD  morphine (MSIR) 15 MG tablet Take 1 tablet (15 mg total) by mouth every 6 (six) hours as needed for moderate pain or severe pain. 09/26/17   Earlie Server, MD  Multiple Vitamin (MULTIVITAMIN WITH MINERALS) TABS tablet Take 1 tablet by mouth daily after breakfast. CENTRUM SILVER    [provider]  nystatin (MYCOSTATIN) 100000 UNIT/ML suspension  06/25/17   [provider]  ondansetron (ZOFRAN) 8 MG tablet TAKE 1 TAB BY MOUTH 2 TIMES DAILY AS NEEDED FOR REFRACTORY NAUSEA/VOMITING. START ON DAY 3 AFTER CHEMO 10/01/17   Earlie Server, MD  PEPPERMINT OIL PO Take 1 capsule by mouth 3 (three) times daily as needed (for IBS). IBgard (active ingredient: 90 mg ultrapurified peppermint oil)    [provider]  polyethylene glycol powder (GLYCOLAX/MIRALAX) powder Take 17 g by  mouth daily after breakfast. Mix with glass of water 05/08/17   Earlie Server, MD  predniSONE (DELTASONE) 10 MG tablet Take 3 tablets (30 mg total) by mouth daily with breakfast. 10/10/17   Earlie Server, MD  promethazine (PHENERGAN) 25 MG suppository Place 1 suppository (25 mg total) rectally every 6 (six) hours as needed for nausea. Patient not taking: Reported on 09/26/2017 07/10/17 07/10/18  Loletha Grayer, MD  promethazine (PHENERGAN) 25 MG tablet Take 1 tablet (25 mg total) by mouth every 6 (six) hours as needed for nausea or vomiting. Patient not taking: Reported on 09/26/2017 07/10/17   Loletha Grayer, MD  RABEprazole (ACIPHEX) 20 MG tablet Take 20 mg by mouth daily before breakfast.  03/08/15   [provider]  SENNA LAX 8.6 MG tablet TAKE TWO TABLETS BY MOUTH DAILY 07/26/17   Earlie Server, MD  sertraline (ZOLOFT) 100 MG tablet Take 100 mg by mouth daily after breakfast.    [provider]  sodium chloride 1 g tablet Take 1 tablet (1 g total) by mouth 2 (two) times daily with a meal. 10/10/17   Earlie Server, MD  sodium phosphate (FLEET) 7-19 GM/118ML ENEM Place 133 mLs (1 enema total) rectally daily as needed for severe constipation. Patient not taking: Reported on 09/26/2017 07/10/17   Loletha Grayer, MD  tamsulosin (FLOMAX) 0.4 MG CAPS capsule Take 0.8 mg by mouth daily after supper.  02/04/14   [provider]    REVIEW OF SYSTEMS:  Review of Systems  Constitutional: Positive for malaise/fatigue. Negative for chills, fever and weight loss.  HENT: Negative for ear pain, hearing loss and tinnitus.   Eyes: Negative for blurred vision, double vision, pain and redness.  Respiratory: Negative for cough, hemoptysis and shortness of breath.   Cardiovascular: Negative for chest pain, palpitations, orthopnea and leg swelling.  Gastrointestinal: Positive for abdominal pain. Negative for constipation, diarrhea, nausea and vomiting.  Genitourinary: Negative for dysuria, frequency and  hematuria.  Musculoskeletal: Negative for back pain, joint pain and neck pain.  Skin:       No acne, rash, or lesions  Neurological: Positive for weakness. Negative for dizziness, tremors and focal weakness.  Endo/Heme/Allergies: Negative for polydipsia. Does not bruise/bleed easily.  Psychiatric/Behavioral: Negative for depression. The patient is not nervous/anxious and does not have insomnia.      VITAL SIGNS:   Vitals:  10/20/17 1639 10/20/17 1642  BP:  (!) 159/89  Pulse:  (!) 101  Resp:  18  Temp:  98.2 F (36.8 C)  TempSrc:  Oral  SpO2: 98% 96%   Wt Readings from Last 3 Encounters:  10/10/17 61.2 kg (135 lb)  10/03/17 60.8 kg (134 lb)  09/26/17 60.8 kg (134 lb)    PHYSICAL EXAMINATION:  Physical Exam  Vitals reviewed. Constitutional: He is oriented to person, place, and time. He appears well-developed and well-nourished. No distress.  HENT:  Head: Normocephalic and atraumatic.  Mouth/Throat: Oropharynx is clear and moist.  Eyes: Conjunctivae and EOM are normal. Pupils are equal, round, and reactive to light. No scleral icterus.  Neck: Normal range of motion. Neck supple. No JVD present. No thyromegaly present.  Cardiovascular: Regular rhythm and intact distal pulses. Exam reveals no gallop and no friction rub.  No murmur heard. Borderline tachycardic  Respiratory: Effort normal and breath sounds normal. No respiratory distress. He has no wheezes. He has no rales.  GI: Soft. Bowel sounds are normal. He exhibits no distension. There is tenderness (suprapubic).  Musculoskeletal: Normal range of motion. He exhibits no edema.  No arthritis, no gout  Lymphadenopathy:    He has no cervical adenopathy.  Neurological: He is alert and oriented to person, place, and time. No cranial nerve deficit.  No dysarthria, no aphasia  Skin: Skin is warm and dry. No rash noted. No erythema.  Psychiatric: He has a normal mood and affect. His behavior is normal. Judgment and thought  content normal.    LABORATORY PANEL:   CBC Recent Labs  Lab 10/20/17 1821  WBC 11.1*  HGB 9.4*  HCT 30.3*  PLT 110*   ------------------------------------------------------------------------------------------------------------------  Chemistries  Recent Labs  Lab 10/20/17 1821  NA 128*  K 4.4  CL 92*  CO2 26  GLUCOSE 252*  BUN 10  CREATININE 0.56*  CALCIUM 7.9*  AST 38  ALT 82*  ALKPHOS 341*  BILITOT 0.4   ------------------------------------------------------------------------------------------------------------------  Cardiac Enzymes Recent Labs  Lab 10/20/17 1821  TROPONINI 0.04*   ------------------------------------------------------------------------------------------------------------------  RADIOLOGY:  No results found.  EKG:   Orders placed or performed in visit on 09/10/17  . EKG 12-Lead  . EKG 12-Lead  . EKG 12-Lead    IMPRESSION AND PLAN:  Principal Problem:   UTI (urinary tract infection) -IV antibiotics started, urine culture sent, will be sure to exchange his Foley catheter. Active Problems:   Arteriosclerosis of coronary artery -continue home meds, troponin was mildly elevated and I suspect this is demand ischemia.  We will trend his cardiac enzymes tonight to be sure   HTN (hypertension) -continue home medications   Transitional cell carcinoma of kidney, right ([place]) -underlying cause of patient's progressive decline.  Patient and family have agreed to palliative care consult to explore resources for supportive measures.  Patient is not interested in hospice at this time.   CKD (chronic kidney disease) stage 3, GFR 30-59 ml/min (HCC) -avoid nephrotoxins, monitor   GERD (gastroesophageal reflux disease) -home dose PPI   HLD (hyperlipidemia) -home dose combination antilipid   Adult hypothyroidism -home dose thyroid replacement  All the records are reviewed and case discussed with ED provider. Management plans discussed with the patient  and/or family.  DVT PROPHYLAXIS: Systemic anticoagulation  GI PROPHYLAXIS: PPI  ADMISSION STATUS: Inpatient  CODE STATUS: DNR Code Status History    Date Active Date Inactive Code Status Order ID Comments User Context   09/11/2017 00:53 09/12/2017 18:38 DNR [id number]  Lance Coon, MD Inpatient   07/07/2017 13:44 07/10/2017 21:32 DNR [id number]  Loletha Grayer, MD Inpatient   07/07/2017 01:41 07/07/2017 13:44 Full Code [id number]  Harvie Bridge, DO Inpatient   05/02/2017 17:04 05/04/2017 18:51 Full Code [id number]  [name], MD Inpatient   04/25/2017 08:51 04/25/2017 14:36 Full Code [id number]  Isaias Cowman, MD Inpatient    Questions for Most Recent Historical Code Status (Order [id number])    Question Answer Comment   In the event of cardiac or respiratory ARREST Do not call a "code blue"    In the event of cardiac or respiratory ARREST Do not perform Intubation, CPR, defibrillation or ACLS    In the event of cardiac or respiratory ARREST Use medication by any route, position, wound care, and other measures to relive pain and suffering. May use oxygen, suction and manual treatment of airway obstruction as needed for comfort.    Comments nurse may pronounce         Advance Directive Documentation     Most Recent Value  Type of Advance Directive  Out of facility DNR (pink MOST or yellow form)  Pre-existing out of facility DNR order (yellow form or pink MOST form)  Yellow form placed in chart (order not valid for inpatient use)  "MOST" Form in Place?  No data      TOTAL TIME TAKING CARE OF THIS PATIENT: 45 minutes.   Krislyn Donnan [place] 10/20/2017, 9:01 PM  [facility]  Office  [phone]  CC: Primary care physician; [name], MD  Note:  This document was prepared using Dragon voice recognition software and may include unintentional dictation errors.

## 2017-10-20 NOTE — ED Provider Notes (Signed)
[facility] Emergency Department Provider Note  ____________________________________________   I have reviewed the triage vital signs and the nursing notes.   HISTORY  Chief Complaint Weakness   History limited by: Not Limited   HPI Joseph Ritter is a 77 y.o. male who presents to the emergency department today because of concern for increasing weakness. The patient states that he has a history of renal cancer. Has had issues with weakness in the past but recently it has become more severe. Wife gives example that patient used to be able to lie on his side and feed himself however the wife has had to feed him today. The patient is not able to stand up any more. The patient denies any associated chest pain. Has not had any fevers.  Per medical record review patient has a history of renal cancer, UTIs  Past Medical History:  Diagnosis Date  . Acid indigestion 03/09/2014  . Adult hypothyroidism 11/18/2012  . Anxiety   . Arteriosclerosis of coronary artery 11/18/2012   Overview:  PATENT LIMA TO LAD, PATENT SVG TO PDA AND OCCULUDED SVG TO OM2 BY CATHERIZATION 02/09/2009   . 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 Borderline diabetes 05/25/2014  . BP (high blood pressure) 11/18/2012  . BPH (benign prostatic hypertrophy)   . CAFL (chronic airflow limitation) ([place]) 05/03/2015  . Cancer ([place])    renal cancer with mets  . CCF (congestive cardiac failure) ([place]) 01/03/2014   Overview:  HX OF   . COPD (chronic obstructive pulmonary disease) ([place])   . DDD (degenerative disc disease), lumbar 06/29/2014  . 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 Gastric ulcer 03/09/2014  . 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 History of urinary self-catheterization 2017  . HLD (hyperlipidemia) 01/03/2014  . Incomplete bladder emptying 11/18/2012  . Leg  weakness 11/18/2012  . Lumbar radiculitis   . Neuritis or radiculitis due to rupture of lumbar intervertebral disc 06/29/2014  . Overactive bladder   . PONV (postoperative nausea and vomiting)    years ago with Ether, no problem with Nausea or vomiting with the last few surgerys  . Pre-diabetes   . 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 Teeth problem    pt reports "bad teeth", "need to be pulled"    Patient Active Problem List   Diagnosis Date Noted  . Malnutrition of moderate degree 09/12/2017  . UTI (urinary tract infection) 09/10/2017  . Sepsis secondary to UTI ([place]) 07/07/2017  . Malignant neoplasm of right kidney ([place])   . Goals of care, counseling/discussion 05/25/2017  . 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 CKD (chronic kidney disease) stage 3, GFR 30-59 ml/min (HCC) 05/08/2017  . Transitional cell carcinoma of kidney, right ([place]) 05/02/2017  . 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 Gross hematuria 08/15/2016  . Dysuria 08/15/2016  . Acute cystitis with hematuria 08/15/2016  . CAFL (chronic airflow limitation) ([place]) 05/03/2015  . Clinical depression 05/03/2015  . 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 DDD (degenerative disc disease), lumbar 06/29/2014  . Neuritis or radiculitis due to rupture of lumbar intervertebral disc 06/29/2014  . 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 Borderline diabetes 05/25/2014  . Blood glucose elevated 05/25/2014  . Acid indigestion 03/09/2014  . Gastric ulcer 03/09/2014  . CCF (congestive cardiac failure) ([place])  01/03/2014  . HLD (hyperlipidemia) 01/03/2014  . 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 Arteriosclerosis of coronary artery 11/18/2012  . BP (high blood pressure) 11/18/2012  . Adult hypothyroidism 11/18/2012  . Incomplete bladder emptying 11/18/2012  . Leg weakness 11/18/2012  . H/O  coronary artery bypass surgery 04/07/2002    Past Surgical History:  Procedure Laterality Date  . ARTERY BIOPSY Right 12/16/2015   Procedure: BIOPSY TEMPORAL ARTERY;  Surgeon: Algernon Huxley, MD;  Location: [facility];  Service: Vascular;  Laterality: Right;  . CARDIAC CATHETERIZATION    . CATARACT EXTRACTION W/PHACO Left 01/26/2016   Procedure: CATARACT EXTRACTION PHACO AND INTRAOCULAR LENS PLACEMENT (IOC) LEFT EYE;  Surgeon: Leandrew Koyanagi, MD;  Location: [place];  Service: Ophthalmology;  Laterality: Left;  . CORONARY ARTERY BYPASS GRAFT  04/07/2002   [facility]  . Cysto Bladder Botox Injection  07/02/2014  . CYSTOSCOPY W/ RETROGRADES Right 03/16/2017   Procedure: CYSTOSCOPY WITH RETROGRADE PYELOGRAM;  Surgeon: [name], MD;  Location: [facility];  Service: Urology;  Laterality: Right;  . CYSTOSCOPY WITH STENT PLACEMENT Right 03/16/2017   Procedure: CYSTOSCOPY WITH STENT PLACEMENT;  Surgeon: [name], MD;  Location: [facility];  Service: Urology;  Laterality: Right;  . EYE SURGERY Left    blood clot behind left eye  . HAND SURGERY Bilateral   . HERNIA REPAIR Right    inguinal hernia repair  . LEFT HEART CATH AND CORS/GRAFTS ANGIOGRAPHY N/A 04/25/2017   Procedure: Left Heart Cath and Cors/Grafts Angiography;  Surgeon: Isaias Cowman, MD;  Location: [place] [facility];  Service: Cardiovascular;  Laterality: N/A;  . PORTA CATH INSERTION N/A 05/30/2017   Procedure: Glori Luis Cath Insertion;  Surgeon: Katha Cabal, MD;  Location: [place] [facility];  Service: Cardiovascular;  Laterality: N/A;  . ROBOT ASSITED LAPAROSCOPIC NEPHROURETERECTOMY Right 05/02/2017   Procedure: ROBOT ASSITED LAPAROSCOPIC NEPHROURETERECTOMY ATTEMPTED;  Surgeon: [name], MD;  Location: [facility];  Service: Urology;  Laterality: Right;  . URETEROSCOPY Right 03/16/2017   Procedure: URETEROSCOPY BIOPSY RENAL MASS;  Surgeon: [name], MD;  Location: [facility];  Service:  Urology;  Laterality: Right;    Prior to Admission medications   Medication Sig Start Date End Date Taking? Authorizing Provider  apixaban (ELIQUIS) 5 MG TABS tablet Take 1 tablet (5 mg total) by mouth 2 (two) times daily. 09/26/17   Earlie Server, MD  buPROPion (WELLBUTRIN SR) 100 MG 12 hr tablet TAKE ONE TABLET BY MOUTH EVERY DAY 05/29/17   [provider]  cyclobenzaprine (FLEXERIL) 10 MG tablet Take 10 mg by mouth at bedtime as needed for muscle spasms.    [provider]  ezetimibe-simvastatin (VYTORIN) 10-40 MG tablet Take 1 tablet by mouth at bedtime.    [provider]  feeding supplement, ENSURE ENLIVE, (ENSURE ENLIVE) LIQD Take 237 mLs by mouth 2 (two) times daily between meals. 07/10/17   Loletha Grayer, MD  hyoscyamine (ANASPAZ) 0.125 MG TBDP disintergrating tablet Place 0.125 mg under the tongue every 6 (six) hours as needed (for abdominal cramping due to IBS).    [provider]  ketoconazole (NIZORAL) 2 % shampoo APPLY SHAMPOO TO HAIR AS NEEDED FOR FLAKY/ITCHY SCALP (TYPICALLY EVERY 2-3 DAYS) 03/01/15   [provider]  levothyroxine (SYNTHROID, LEVOTHROID) 137 MCG tablet Take 1 tablet (137 mcg total) by mouth daily before breakfast. 10/04/17   Earlie Server, MD  lidocaine (LIDODERM) 5 %  Place 1 patch onto the skin every 12 (twelve) hours. Remove & Discard patch within 12 hours or as directed by MD 06/25/17 06/25/18  Earlie Server, MD  LORazepam (ATIVAN) 0.5 MG tablet Take 1 tablet (0.5 mg total) by mouth every 8 (eight) hours as needed for anxiety (nausea). Patient not taking: Reported on 09/26/2017 06/01/17   Earlie Server, MD  morphine (MS CONTIN) 30 MG 12 hr tablet Take 2 tablets (60 mg total) by mouth every 12 (twelve) hours. 09/26/17   Earlie Server, MD  morphine (MSIR) 15 MG tablet Take 1 tablet (15 mg total) by mouth every 6 (six) hours as needed for moderate pain or severe pain. 09/26/17   Earlie Server, MD  Multiple Vitamin (MULTIVITAMIN WITH MINERALS) TABS tablet Take 1  tablet by mouth daily after breakfast. CENTRUM SILVER    [provider]  nystatin (MYCOSTATIN) 100000 UNIT/ML suspension  06/25/17   [provider]  ondansetron (ZOFRAN) 8 MG tablet TAKE 1 TAB BY MOUTH 2 TIMES DAILY AS NEEDED FOR REFRACTORY NAUSEA/VOMITING. START ON DAY 3 AFTER CHEMO 10/01/17   Earlie Server, MD  PEPPERMINT OIL PO Take 1 capsule by mouth 3 (three) times daily as needed (for IBS). IBgard (active ingredient: 90 mg ultrapurified peppermint oil)    [provider]  polyethylene glycol powder (GLYCOLAX/MIRALAX) powder Take 17 g by mouth daily after breakfast. Mix with glass of water 05/08/17   Earlie Server, MD  predniSONE (DELTASONE) 10 MG tablet Take 3 tablets (30 mg total) by mouth daily with breakfast. 10/10/17   Earlie Server, MD  promethazine (PHENERGAN) 25 MG suppository Place 1 suppository (25 mg total) rectally every 6 (six) hours as needed for nausea. Patient not taking: Reported on 09/26/2017 07/10/17 07/10/18  Loletha Grayer, MD  promethazine (PHENERGAN) 25 MG tablet Take 1 tablet (25 mg total) by mouth every 6 (six) hours as needed for nausea or vomiting. Patient not taking: Reported on 09/26/2017 07/10/17   Loletha Grayer, MD  RABEprazole (ACIPHEX) 20 MG tablet Take 20 mg by mouth daily before breakfast.  03/08/15   [provider]  SENNA LAX 8.6 MG tablet TAKE TWO TABLETS BY MOUTH DAILY 07/26/17   Earlie Server, MD  sertraline (ZOLOFT) 100 MG tablet Take 100 mg by mouth daily after breakfast.    [provider]  sodium chloride 1 g tablet Take 1 tablet (1 g total) by mouth 2 (two) times daily with a meal. 10/10/17   Earlie Server, MD  sodium phosphate (FLEET) 7-19 GM/118ML ENEM Place 133 mLs (1 enema total) rectally daily as needed for severe constipation. Patient not taking: Reported on 09/26/2017 07/10/17   Loletha Grayer, MD  tamsulosin (FLOMAX) 0.4 MG CAPS capsule Take 0.8 mg by mouth daily after supper.  02/04/14   [provider]     Allergies Patient has no known allergies.  Family History  Problem Relation Age of Onset  . Heart attack Mother   . Heart disease Father   . Prostate cancer Neg Hx   . Bladder Cancer Neg Hx   . Kidney cancer Neg Hx     Social History Social History   Tobacco Use  . Smoking status: Former Smoker    Types: Cigarettes    Last attempt to quit: 04/07/2002    Years since quitting: 15.5  . Smokeless tobacco: Former Systems developer    Types: Snuff, Chew  Substance Use Topics  . Alcohol use: No    Alcohol/week: 0.0 oz  . Drug use: No  Review of Systems Constitutional: No fever/chills. Positive for generalized weakness. Eyes: No visual changes. ENT: No sore throat. Cardiovascular: Denies chest pain. Respiratory: Denies shortness of breath. Gastrointestinal: No abdominal pain.  No nausea, no vomiting.  No diarrhea.   Genitourinary: Negative for dysuria. Musculoskeletal: Negative for back pain. Skin: Negative for rash. Neurological: Negative for headaches, focal weakness or numbness.  ____________________________________________   PHYSICAL EXAM:  VITAL SIGNS: ED Triage Vitals  Enc Vitals Group     BP 10/20/17 1642 (!) 159/89     Pulse Rate 10/20/17 1642 (!) 101     Resp 10/20/17 1642 18     Temp 10/20/17 1642 98.2 F (36.8 C)     Temp Source 10/20/17 1642 Oral     SpO2 10/20/17 1639 98 %   Constitutional: Alert and oriented. Well appearing and in no distress. Eyes: Conjunctivae are normal.  ENT   Head: Normocephalic and atraumatic.   Nose: No congestion/rhinnorhea.   Mouth/Throat: Mucous membranes are moist.   Neck: No stridor. Hematological/Lymphatic/Immunilogical: No cervical lymphadenopathy. Cardiovascular: Normal rate, regular rhythm.  No murmurs, rubs, or gallops.  Respiratory: Normal respiratory effort without tachypnea nor retractions. Breath sounds are clear and equal bilaterally. No wheezes/rales/rhonchi. Gastrointestinal: Soft and non tender. No  rebound. No guarding.  Genitourinary: Deferred Musculoskeletal: Normal range of motion in all extremities. No lower extremity edema. Neurologic:  Normal speech and language. Diffusely weak. No gross focal neurologic deficits are appreciated.  Skin:  Skin is warm, dry and intact. No rash noted. Psychiatric: Mood and affect are normal. Speech and behavior are normal. Patient exhibits appropriate insight and judgment.  ____________________________________________    LABS (pertinent positives/negatives)  CBC wbc 11.1, hgb 9.4, plt 110 UA too numerous to count WBC, large leukocytes CMP na 128, glu 252, cr 0.56  ____________________________________________   EKG  None  ____________________________________________    RADIOLOGY  None  ____________________________________________   PROCEDURES  Procedures  ____________________________________________   INITIAL IMPRESSION / ASSESSMENT AND PLAN / ED COURSE  Pertinent labs & imaging results that were available during my care of the patient were reviewed by me and considered in my medical decision making (see chart for details).  Patient with history of renal cancer with metastatic disease who presents to the emergency department with primary concerns for worsening weakness.  Differential would be broad including furthering of oncologic process, infection, anemia, electrolyte abnormalities among other etiologies.  Workup is consistent with a urinary tract infection.  Patient has had this in the past.  Given profound weakness will plan on admission to the hospital service.  Discussed findings and plan with patient and family.  ____________________________________________   FINAL CLINICAL IMPRESSION(S) / ED DIAGNOSES  Final diagnoses:  Weakness  Lower urinary tract infectious disease  Hyponatremia     Note: This dictation was prepared with Dragon dictation. Any transcriptional errors that result from this process are  unintentional     Nance Pear, MD 10/21/17 [phone]

## 2017-10-20 NOTE — ED Notes (Signed)
Lorrie RN, aware of bed assigned  

## 2017-10-20 NOTE — Progress Notes (Signed)
ANTIBIOTIC CONSULT NOTE - INITIAL  Pharmacy Consult for ceftriaxone Indication: UTI  No Known Allergies  Patient Measurements:   Adjusted Body Weight:   Vital Signs: Temp: 98.2 F (36.8 C) (01/26 1642) Temp Source: Oral (01/26 1642) BP: 159/89 (01/26 1642) Pulse Rate: 101 (01/26 1642) Intake/Output from previous day: No intake/output data recorded. Intake/Output from this shift: No intake/output data recorded.  Labs: Recent Labs    10/20/17 1821  WBC 11.1*  HGB 9.4*  PLT 110*  CREATININE 0.56*   Estimated Creatinine Clearance: 69.1 mL/min (A) (by C-G formula based on SCr of 0.56 mg/dL (L)). No results for input(s): VANCOTROUGH, VANCOPEAK, VANCORANDOM, GENTTROUGH, GENTPEAK, GENTRANDOM, TOBRATROUGH, TOBRAPEAK, TOBRARND, AMIKACINPEAK, AMIKACINTROU, AMIKACIN in the last 72 hours.   Microbiology: No results found for this or any previous visit (from the past 720 hour(s)).  Medical History: Past Medical History:  Diagnosis Date  . Acid indigestion 03/09/2014  . Adult hypothyroidism 11/18/2012  . Anxiety   . Arteriosclerosis of coronary artery 11/18/2012   Overview:  PATENT LIMA TO LAD, PATENT SVG TO PDA AND OCCULUDED SVG TO OM2 BY CATHERIZATION 02/09/2009   . 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 Borderline diabetes 05/25/2014  . BP (high blood pressure) 11/18/2012  . BPH (benign prostatic hypertrophy)   . CAFL (chronic airflow limitation) ([place]) 05/03/2015  . Cancer ([place])    renal cancer with mets  . CCF (congestive cardiac failure) ([place]) 01/03/2014   Overview:  HX OF   . COPD (chronic obstructive pulmonary disease) ([place])   . DDD (degenerative disc disease), lumbar 06/29/2014  . 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 Gastric ulcer 03/09/2014  . 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 History of urinary self-catheterization 2017  . HLD  (hyperlipidemia) 01/03/2014  . Incomplete bladder emptying 11/18/2012  . Leg weakness 11/18/2012  . Lumbar radiculitis   . Neuritis or radiculitis due to rupture of lumbar intervertebral disc 06/29/2014  . Overactive bladder   . PONV (postoperative nausea and vomiting)    years ago with Ether, no problem with Nausea or vomiting with the last few surgerys  . Pre-diabetes   . 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 Teeth problem    pt reports "bad teeth", "need to be pulled"    Medications:  Infusions:  . [START ON 10/21/2017] cefTRIAXone (ROCEPHIN)  IV     Assessment: 77 yom cc progressive weakness, unable to sit up on own, decreased mobility, decreased PO intake. UA with large leukocytes, rare bacteria, and WBC TNTC. Pharmacy consulted to dose ceftriaxone for UTI.  Goal of Therapy:  Resolve infection Prevent ADE  Plan:  Ceftriaxone 1 gm IV Q24H  Laural Benes, Pharm.D., BCPS Clinical Pharmacist 10/20/2017,9:04 PM

## 2017-10-21 LAB — BASIC METABOLIC PANEL
Anion gap: 10 (ref 5–15)
BUN: 13 mg/dL (ref 6–20)
CALCIUM: 7.9 mg/dL — AB (ref 8.9–10.3)
CO2: 25 mmol/L (ref 22–32)
CREATININE: 0.59 mg/dL — AB (ref 0.61–1.24)
Chloride: 93 mmol/L — ABNORMAL LOW (ref 101–111)
GFR calc Af Amer: >60 mL/min (ref >60)
Glucose, Bld: 108 mg/dL — ABNORMAL HIGH (ref 65–99)
Potassium: 4.3 mmol/L (ref 3.5–5.1)
SODIUM: 128 mmol/L — AB (ref 135–145)

## 2017-10-21 LAB — CBC
HCT: 30.7 % — ABNORMAL LOW (ref 40.0–52.0)
Hemoglobin: 9.8 g/dL — ABNORMAL LOW (ref 13.0–18.0)
MCH: 25.5 pg — ABNORMAL LOW (ref 26.0–34.0)
MCHC: 31.8 g/dL — AB (ref 32.0–36.0)
MCV: 80.2 fL (ref 80.0–100.0)
PLATELETS: 124 10*3/uL — AB (ref 150–440)
RBC: 3.83 MIL/uL — ABNORMAL LOW (ref 4.40–5.90)
RDW: 19.6 % — AB (ref 11.5–14.5)
WBC: 11.5 10*3/uL — ABNORMAL HIGH (ref 3.8–10.6)

## 2017-10-21 LAB — TROPONIN I
TROPONIN I: 0.03 ng/mL — AB (ref <0.03)
TROPONIN I: 0.04 ng/mL — AB (ref <0.03)

## 2017-10-21 MED ORDER — SODIUM CHLORIDE 1 G PO TABS
1.0000 g | ORAL_TABLET | Freq: Two times a day (BID) | ORAL | Status: DC
  Administered 2017-10-21 – 2017-10-24 (×6): 1 g via ORAL
  Filled 2017-10-21 (×6): qty 1

## 2017-10-21 MED ORDER — SODIUM CHLORIDE 0.9 % IV SOLN
INTRAVENOUS | Status: AC
  Administered 2017-10-21: 13:00:00 via INTRAVENOUS

## 2017-10-21 MED ORDER — LIDOCAINE 5 % EX PTCH
1.0000 | MEDICATED_PATCH | CUTANEOUS | Status: DC
  Administered 2017-10-21 – 2017-10-23 (×3): 1 via TRANSDERMAL
  Filled 2017-10-21 (×4): qty 1

## 2017-10-21 MED ORDER — SODIUM CHLORIDE 0.9 % IV SOLN
12.5000 mg | Freq: Three times a day (TID) | INTRAVENOUS | Status: DC | PRN
  Administered 2017-10-22 – 2017-10-23 (×2): 12.5 mg via INTRAVENOUS
  Filled 2017-10-21 (×3): qty 0.5

## 2017-10-21 MED ORDER — NYSTATIN 100000 UNIT/ML MT SUSP
5.0000 mL | Freq: Four times a day (QID) | OROMUCOSAL | Status: DC
  Administered 2017-10-21 – 2017-10-24 (×12): 500000 [IU] via ORAL
  Filled 2017-10-21 (×12): qty 5

## 2017-10-21 MED ORDER — MENTHOL 3 MG MT LOZG
1.0000 | LOZENGE | OROMUCOSAL | Status: DC | PRN
  Filled 2017-10-21: qty 9

## 2017-10-21 MED ORDER — PREDNISONE 10 MG PO TABS
10.0000 mg | ORAL_TABLET | Freq: Every day | ORAL | Status: DC
  Administered 2017-10-22 – 2017-10-24 (×3): 10 mg via ORAL
  Filled 2017-10-21 (×3): qty 1

## 2017-10-21 NOTE — Progress Notes (Signed)
Family Meeting Note  Advance Directive:yes  Today a meeting took place with the Patient, wife and sister-in-law in the patient's room    The following clinical team members were present during this meeting:MD  The following were discussed:Patient's diagnosis: UTI, metastatic renal cell cancer, generalized weakness; patient's progosis: Poor and Goals for treatment: DNR  Additional follow-up to be provided: MD and oncology  Time spent during discussion: 45 minutes  Nicholes Mango, MD

## 2017-10-21 NOTE — Progress Notes (Deleted)
[place] [facility] Follow Up visit  Patient Care Team: [name], MD as PCP - General (Physician Assistant)  REASON FOR VISIT Treatment of Locally advanced kidney urothelial cancer  ONCOLOGY HISTORY Joseph Ritter 77 y.o. male with PMH listed as below  is referred to [place] for evaluation of locally advanced kidney urothelia cancer. Patient developed gross hematuria at the end of 2017. CT 08/2016 showed endophytic hypodense mass. Repeat image 01/2017 showed enlarged  endophytic hypodense mass right kidney. 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 Right robotic nephroureterectomy was attempted but aborted due to unresectable newly metastatic disease diagnosed intraoperatively. Postop he underwent a CT scan of the abdomen and pelvis which showed extension of an infiltrative mass from the right kidney along the right renal vein toward the IVC with encasement of the IVC.  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 He self catheterize a few time a day.  He reports having back pain which limits her ability to walk. Also constipated and abdominal bloating. His appetite is fair. He takes Norco for abdominal and back pain.  He can walk with walker for very short distance, mostly limited by back pain and fatigue./weakness.  # Images: CT chest and abdomen 05/03/2017 1. Pneumoperitoneum with a small amount of ascites, gas tracking along the thoracoabdominal wall into the scrotum, and a small amount of gas in the right perirenal space-these findings are likely related to the patient's attempted nephroureterectomy yesterday. Surveillance to ensure expected resolution of the pneumoperitoneum might be appropriate. 2. The right kidney lower pole infarct with lack of patency of the more inferior of the 3 right renal arteries. There is delayed nephrogram on the right with increase an infiltrative process in theright  kidney and extending into the right renal hilar vascular structures, including the right renal vein and the adjacent IVC, compatible with tumor thrombus. There is also soft tissue density surrounding the renal hilar vascular structures and extending into the retroperitoneum and periaortic region, some of which may be tumor and some of which may be hematoma.3. Demineralization of the anterior inferior L2 vertebral body,concerning for tumor invasion. This is near the level of the retroaortic left renal vein. 4. No findings of metastatic disease to the chest. There are new trace bilateral pleural effusions with mild passive atelectasis.  #Pathology: 05/02/2017 SPECIMEN SUBMITTED: A. Hilar tissue, right kidney B. Hilar tissue, right kidney DIAGNOSIS:  A and B. SOFT TISSUE, RIGHT RENAL HILUM  PD-L1 - Urothelial (TECENTRIQ) Immunohistochemistry Analysis  Result: 0% Interpretation: No expression   # Current Treatment: 1 Palliative RT to spine 2 Chemotherapy:     06/01/2017 Cycle 1 Gemcitabine and Carboplain.  Day 8 Gemcitabine was delayed due to intractable nausea and vomiting.     Chemotherapy related thrombocytopenia, no active bleeding. 50% dose of Gemcitabine was given on day 11.     06/25/2017 Cycle 2 Day 1 Gemcitabine and Carboplain.    07/03/2017 Cycle 2 Day 8 chemotherapy discontinued.      3 Immunotherapy:     07/27/2017 cycle 1 of Atezolizumab, 11/26 2018 cycle 2 Atezolizumab was held due to hepatic toxicity.        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 His TSH was found to be elevated, and we adjusted his Synthroid dosage. Patient reports feeling better, appetite is okay. He reports strength in  a lot of plain water plus Gatorade/Pedialyte /Ensure drinks.  Denies any nausea or vomiting.  Review of Systems  Constitutional: Positive for fatigue. Negative for chills, diaphoresis and  fever.  HENT:   Negative for lump/mass, nosebleeds, sore throat and tinnitus.   Eyes: Negative for eye problems and icterus.  Respiratory: Negative for chest tightness and hemoptysis.   Cardiovascular: Negative for chest pain and palpitations.  Gastrointestinal: Negative for abdominal distention, abdominal pain, blood in stool, diarrhea, nausea and vomiting.  Endocrine: Negative for hot flashes.  Genitourinary: Negative for bladder incontinence, difficulty urinating, dysuria, frequency and hematuria.   Musculoskeletal: Positive for back pain. Negative for flank pain and myalgias.  Skin: Negative for itching and rash.  Neurological: Negative for dizziness, headaches and numbness.  Hematological: Does not bruise/bleed easily.  Psychiatric/Behavioral: Negative for confusion and decreased concentration. The patient is not nervous/anxious.      MEDICAL HISTORY: Past Medical History:  Diagnosis Date  . Acid indigestion 03/09/2014  . Adult hypothyroidism 11/18/2012  . Anxiety   . Arteriosclerosis of coronary artery 11/18/2012   Overview:  PATENT LIMA TO LAD, PATENT SVG TO PDA AND OCCULUDED SVG TO OM2 BY CATHERIZATION 02/09/2009   . 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 Borderline diabetes 05/25/2014  . BP (high blood pressure) 11/18/2012  . BPH (benign prostatic hypertrophy)   . CAFL (chronic airflow limitation) ([place]) 05/03/2015  . Cancer ([place])    renal cancer with mets  . CCF (congestive cardiac failure) ([place]) 01/03/2014   Overview:  HX OF   . COPD (chronic obstructive pulmonary disease) ([place])   . DDD (degenerative disc disease), lumbar 06/29/2014  . 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 Gastric ulcer 03/09/2014  . 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 History of urinary self-catheterization 2017  . HLD (hyperlipidemia) 01/03/2014  . Incomplete bladder  emptying 11/18/2012  . Leg weakness 11/18/2012  . Lumbar radiculitis   . Neuritis or radiculitis due to rupture of lumbar intervertebral disc 06/29/2014  . Overactive bladder   . PONV (postoperative nausea and vomiting)    years ago with Ether, no problem with Nausea or vomiting with the last few surgerys  . Pre-diabetes   . Subacute transverse myelitis ([place]) 11/21/2012  . Teeth problem    pt reports "bad teeth", "need to be pulled"    SURGICAL HISTORY: Past Surgical History:  Procedure Laterality Date  . ARTERY BIOPSY Right 12/16/2015   Procedure: BIOPSY TEMPORAL ARTERY;  Surgeon: Algernon Huxley, MD;  Location: [facility];  Service: Vascular;  Laterality: Right;  . CARDIAC CATHETERIZATION    . CATARACT EXTRACTION W/PHACO Left 01/26/2016   Procedure: CATARACT EXTRACTION PHACO AND INTRAOCULAR LENS PLACEMENT (IOC) LEFT EYE;  Surgeon: Leandrew Koyanagi, MD;  Location: [facility];  Service: Ophthalmology;  Laterality: Left;  . CORONARY ARTERY BYPASS GRAFT  04/07/2002   [facility]  . Cysto Bladder Botox Injection  07/02/2014  . CYSTOSCOPY W/ RETROGRADES Right 03/16/2017   Procedure: CYSTOSCOPY WITH RETROGRADE PYELOGRAM;  Surgeon: [name], MD;  Location: [facility];  Service: Urology;  Laterality: Right;  . CYSTOSCOPY WITH STENT PLACEMENT Right 03/16/2017   Procedure: CYSTOSCOPY WITH STENT PLACEMENT;  Surgeon: [name], MD;  Location: [facility];  Service: Urology;  Laterality: Right;  . EYE SURGERY Left    blood clot behind left eye  .  HAND SURGERY Bilateral   . HERNIA REPAIR Right    inguinal hernia repair  . LEFT HEART CATH AND CORS/GRAFTS ANGIOGRAPHY N/A 04/25/2017   Procedure: Left Heart Cath and Cors/Grafts Angiography;  Surgeon: Isaias Cowman, MD;  Location: [facility];  Service: Cardiovascular;  Laterality: N/A;  . PORTA CATH INSERTION N/A 05/30/2017   Procedure: Glori Luis Cath Insertion;  Surgeon: Katha Cabal, MD;  Location: [facility];   Service: Cardiovascular;  Laterality: N/A;  . ROBOT ASSITED LAPAROSCOPIC NEPHROURETERECTOMY Right 05/02/2017   Procedure: ROBOT ASSITED LAPAROSCOPIC NEPHROURETERECTOMY ATTEMPTED;  Surgeon: [name], MD;  Location: [facility];  Service: Urology;  Laterality: Right;  . URETEROSCOPY Right 03/16/2017   Procedure: URETEROSCOPY BIOPSY RENAL MASS;  Surgeon: [name], MD;  Location: [facility];  Service: Urology;  Laterality: Right;    SOCIAL HISTORY: Social History   Socioeconomic History  . Marital status: Married    Spouse name: Not on file  . Number of children: Not on file  . Years of education: Not on file  . Highest education level: Not on file  Social Needs  . Financial resource strain: Not on file  . Food insecurity - worry: Not on file  . Food insecurity - inability: Not on file  . Transportation needs - medical: Not on file  . Transportation needs - non-medical: Not on file  Occupational History  . Not on file  Tobacco Use  . Smoking status: Former Smoker    Types: Cigarettes    Last attempt to quit: 04/07/2002    Years since quitting: 15.5  . Smokeless tobacco: Former Systems developer    Types: Snuff, Chew  Substance and Sexual Activity  . Alcohol use: No    Alcohol/week: 0.0 oz  . Drug use: No  . Sexual activity: Not on file  Other Topics Concern  . Not on file  Social History Narrative  . Not on file    FAMILY HISTORY Family History  Problem Relation Age of Onset  . Heart attack Mother   . Heart disease Father   . Prostate cancer Neg Hx   . Bladder Cancer Neg Hx   . Kidney cancer Neg Hx     ALLERGIES:  has No Known Allergies.  MEDICATIONS:  No current facility-administered medications for this visit.    No current outpatient medications on file.   Facility-Administered Medications Ordered in Other Visits  Medication Dose Route Frequency Provider Last Rate Last Dose  . 0.9 %  sodium chloride infusion   Intravenous Continuous Gouru, Aruna, MD 75  mL/hr at 10/21/17 1245    . acetaminophen (TYLENOL) tablet 650 mg  650 mg Oral Q6H PRN Lance Coon, MD       Or  . acetaminophen (TYLENOL) suppository 650 mg  650 mg Rectal Q6H PRN Lance Coon, MD      . apixaban Arne Cleveland) tablet 5 mg  5 mg Oral BID Lance Coon, MD   5 mg at 10/21/17 0731  . buPROPion (WELLBUTRIN SR) 12 hr tablet 100 mg  100 mg Oral Daily Lance Coon, MD   100 mg at 10/21/17 0731  . cefTRIAXone (ROCEPHIN) 1 g in dextrose 5 % 50 mL IVPB  1 g Intravenous Q24H Lance Coon, MD   Stopped at 10/21/17 1741  . chlorproMAZINE (THORAZINE) 12.5 mg in sodium chloride 0.9 % 25 mL IVPB  12.5 mg Intravenous Q8H PRN Gouru, Aruna, MD      . cyclobenzaprine (FLEXERIL) tablet 10 mg  10 mg Oral QHS PRN Lance Coon, MD      . ezetimibe (ZETIA) tablet 10 mg  10 mg Oral Corwin Levins, MD   10 mg at 10/20/17 2343   And  . simvastatin (ZOCOR) tablet 40 mg  40 mg Oral Corwin Levins, MD   40 mg at 10/20/17 2343  . heparin lock flush 100 unit/mL  500 Units Intravenous Once Earlie Server, MD      . hyoscyamine Naoma Diener) disintergrating tablet 0.125 mg  0.125 mg Sublingual Q6H PRN Lance Coon, MD      . levothyroxine (SYNTHROID, LEVOTHROID) tablet 137 mcg  137 mcg Oral QAC breakfast Lance Coon, MD   137 mcg at 10/21/17 0732  . lidocaine (LIDODERM) 5 % 1 patch  1 patch Transdermal Q24H Gouru, Aruna, MD   1 patch at 10/21/17 1244  . menthol-cetylpyridinium (CEPACOL) lozenge 3 mg  1 lozenge Oral PRN Gouru, Aruna, MD      . morphine (MS CONTIN) 12 hr tablet 60 mg  60 mg Oral Q12H Lance Coon, MD   60 mg at 10/21/17 0730  . morphine (MSIR) tablet 15 mg  15 mg Oral Q6H PRN Lance Coon, MD   15 mg at 10/21/17 0654  . nystatin (MYCOSTATIN) 100000 UNIT/ML suspension 500,000 Units  5 mL Oral QID Nicholes Mango, MD   500,000 Units at 10/21/17 1710  . ondansetron (ZOFRAN) tablet 8 mg  8 mg Oral Q8H PRN Lance Coon, MD       Or  . ondansetron [facility]) 8 mg in sodium chloride 0.9 % 50 mL IVPB  8  mg Intravenous Q8H PRN Lance Coon, MD      . pantoprazole (PROTONIX) EC tablet 40 mg  40 mg Oral Daily Lance Coon, MD   40 mg at 10/21/17 0736  . [START ON 10/22/2017] predniSONE (DELTASONE) tablet 10 mg  10 mg Oral Q breakfast Gouru, Aruna, MD      . promethazine (PHENERGAN) injection 12.5 mg  12.5 mg Intravenous Q6H PRN Lance Coon, MD      . [facility]) tablet 17.2 mg  2 tablet Oral Daily Lance Coon, MD   17.2 mg at 10/21/17 0736  . sertraline (ZOLOFT) tablet 100 mg  100 mg Oral QPC breakfast Lance Coon, MD   100 mg at 10/21/17 0733  . sodium chloride flush (NS) 0.9 % injection 10 mL  10 mL Intravenous PRN Earlie Server, MD   10 mL at 07/03/17 0930  . sodium chloride tablet 1 g  1 g Oral BID WC Gouru, Aruna, MD   1 g at 10/21/17 1710  . tamsulosin (FLOMAX) capsule 0.8 mg  0.8 mg Oral QPC supper Lance Coon, MD   0.8 mg at 10/21/17 1710    PHYSICAL EXAMINATION:  ECOG PERFORMANCE STATUS: 2 - Symptomatic, <50% confined to bed  There were no vitals filed for this visit.  There were no vitals filed for this visit.   Physical Exam  Constitutional: He is oriented to person, place, and time. No distress.  HENT:  Head: Normocephalic and atraumatic.  Mouth/Throat: Oropharynx is clear and moist. No oropharyngeal exudate.  Eyes: Conjunctivae and EOM are normal. Pupils are equal, round, and reactive to light. Left eye exhibits no discharge. No scleral icterus.  Neck: Normal range of motion. Neck supple. No JVD present. No tracheal deviation present.  Cardiovascular: Regular rhythm and normal heart sounds. Exam reveals no friction rub.  No murmur heard. tachycardia  Pulmonary/Chest: No stridor. No respiratory  distress. He has no wheezes. He has no rales.  Abdominal: He exhibits no distension. There is no tenderness. There is no rebound.  Genitourinary:  Genitourinary Comments: Foley catheter.   Musculoskeletal: Normal range of motion. He exhibits no edema.  Palpable spinal  tenderness.  Lymphadenopathy:    He has no cervical adenopathy.  Neurological: He is alert and oriented to person, place, and time. No cranial nerve deficit. Coordination normal.  Skin: Skin is warm. No rash noted. He is not diaphoretic. No erythema.  Psychiatric: Affect normal.    ECOG 2  LABORATORY DATA: I have personally reviewed the data as listed: CBC    Component Value Date/Time   WBC 11.5 (H) 10/21/2017 0747   RBC 3.83 (L) 10/21/2017 0747   HGB 9.8 (L) 10/21/2017 0747   HGB 15.3 11/15/2012 1641   HCT 30.7 (L) 10/21/2017 0747   HCT 43.7 11/15/2012 1641   PLT 124 (L) 10/21/2017 0747   PLT 166 11/15/2012 1641   MCV 80.2 10/21/2017 0747   MCV 89 11/15/2012 1641   MCH 25.5 (L) 10/21/2017 0747   MCHC 31.8 (L) 10/21/2017 0747   RDW 19.6 (H) 10/21/2017 0747   RDW 13.3 11/15/2012 1641   LYMPHSABS 0.2 (L) 10/20/2017 1821   LYMPHSABS 1.7 11/15/2012 1641   MONOABS 0.4 10/20/2017 1821   MONOABS 0.8 11/15/2012 1641   EOSABS 0.0 10/20/2017 1821   EOSABS 0.1 11/15/2012 1641   BASOSABS 0.0 10/20/2017 1821   BASOSABS 0.0 11/15/2012 1641   CMP Latest Ref Rng & Units 10/21/2017 10/20/2017 10/10/2017  Glucose 65 - 99 mg/dL 108(H) 252(H) 255(H)  BUN 6 - 20 mg/dL _0 Creatinine 0.61 - 1.24 mg/dL 0.59(L) 0.56(L) 0.78  Sodium 135 - 145 mmol/L 128(L) 128(L) 124(L)  Potassium 3.5 - 5.1 mmol/L 4.3 4.4 4.6  Chloride 101 - 111 mmol/L 93(L) 92(L) 88(L)  CO2 22 - 32 mmol/L _1 Calcium 8.9 - 10.3 mg/dL 7.9(L) 7.9(L) 8.2(L)  Total Protein 6.5 - 8.1 g/dL - 5.8(L) 6.7  Total Bilirubin 0.3 - 1.2 mg/dL - 0.4 0.4  Alkaline Phos 38 - 126 U/L - 341(H) 337(H)  AST 15 - 41 U/L - 38 70(H)  ALT 17 - 63 U/L - 82(H) 142(H)     RADIOGRAPHIC STUDIES: I have personally reviewed the radiological images as listed and agree with the findings in the report 5.  Aortic Atherosclerosis (ICD10-I70.0).  Coronary atherosclerosis. 6. Inflammatory stranding from the right perirenal space extends down  into the pelvis and crosses the midline. There is presacral edema. 7. 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 Recommend followup by ultrasound in 3 years. This recommendation follows ACR consensus guidelines: White Paper of the ACR Incidental Findings Committee II on Vascular Findings. J Am Coll Radiol 2013; 06:269-485 8. Wall thickening along the left side of the urinary bladder may be incidental, but synchronous sessile transitional cell carcinoma is not readily excluded.  CT abdomen wo contrast 02/09/2017  Increased size of 4.6 cm ill-defined hypoenhancing mass ininterpolar region of right kidney, with obstruction of upper pole collecting system. This does not have typical appearance for pyelonephritis or renal cell carcinoma, and raises suspicion for urothelial carcinoma. Consider ureteroscopy for further evaluation. No evidence of metastatic disease. Stable mildly enlarged prostate and mild diffuse bladder wall thickening. Stable 3.0 cm infrarenal abdominal aortic aneurysm. Recommend followup by ultrasound in 3 years. This recommendation follows ACR consensus guidelines: White Paper of the ACR Incidental Findings Committee II on Vascular  Findings. [place] [phone]. Colonic diverticulosis. No radiographic evidence of diverticulitis. Stable small fat-containing left inguinal hernia. MRI thoracic and lumbar w/wo contrast.  1. Malignant invasion of the L2 vertebral body by the adjacent retroperitoneal mass. No pathologic fracture or osseous metastatic disease elsewhere in the thoracolumbar spine. 2. Areas of epidural contrast enhancement of the lumbar spine are most consistent with a dilated epidural venous plexus secondary to invasion of the inferior vena cava by the patient's urothelial tumor. 3. Moderate L4-L5 multifactorial spinal canal stenosis with severe right and mild-to-moderate left neural foraminal stenosis. 4. Moderate right and  severe left multifactorial L5-S1 neural foraminal stenosis. There is also severe attenuation of the thecal sac at this level but no bony spinal canal stenosis. 5. No spinal canal or neural foraminal stenosis in the thoracic spine.   10/01/2017 CT abdomen and pelvis with contrast showed multiple new hepatic metastasis, right renal cortical and hilar mass is similar, similar findings of involvement of the IVC in the right renal vein. Skeletal metastasis at L2, probable new skeletal metastasis at T10. 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) Staging form: Kidney, AJCC 8th Edition - Clinical stage from 05/08/2017: Stage IV (cT3, cNX, cM1) - Signed by Earlie Server, MD on 05/08/2017  No diagnosis found. # Hold immunotherapy due to Elevated LFTs. He has only got one dose of Tecentriq and his liver enzymes increased. He has been started on steroids  60 mg which is slowly being tapered down.For the past 2 weeks he has been on 40 mg and he is LFT has not further improved. Patient's CT also showed multiple liver metastases which can also contribute to his elevated LFT. Bilirubin is so far normal. Discussed with patient that most likely the elevation of LFT is secondary to multiple liver metastasis. Immunotherapy would not be able to be rechallenged due to elevated LFT and his steroid has not been fully tapered down yet.  # goal  care was discussed with patient in detail.the patient understands that his prognosis extremely poor. He is reluctant to proceed with any additional sig line chemotherapy as he did not tolerate chemotherapy well. Hospice has been previously discussed and patient wants to continue his treatment.he is interested in seeking second opinion and I will refer him to see Dr. Aline Brochure at [facility] to see if clinical trial options are available  # His  hypotension has improved after blood pressure medication was held.today his blood pressure stable. I advised patient to continue hold antihypertensive medication..  # Current patient medication regimen is Morphin IR 85m Q6h PRN for pain in combination with MS contin 62mBID. Reports he has enough supply at home. # Xgeva Q28 days last given on 10/01/2017,   #Hypothyroidism on Synthroid 13786mdaily.subjectively patient reports feeling less fatigued. # Will continue slowly tapering his steroids to 30 mg daily for the next 2 weeks. # Hyponatremia likely secondary to assess her water intake. Advised patient to cut down free water intake and also will start him on salt tablets 1 g twice a day. Follow up in 2 weeks to repeat labs and further taper steroid course.   ZhoEarlie ServerD, PhD Hematology Oncology [facility]- [phone]

## 2017-10-21 NOTE — Progress Notes (Signed)
[facility] [place] NAME: Joseph Ritter    MR#:  [id number]  DATE OF BIRTH:  1941-03-07  SUBJECTIVE:  CHIEF COMPLAINT: Patient is weak and tired but otherwise feeling okay.  Wife and sister-in-law at bedside  REVIEW OF SYSTEMS:  CONSTITUTIONAL: No fever, fatigue , reporting generalized weakness.  EYES: No blurred or double vision.  EARS, NOSE, AND THROAT: No tinnitus or ear pain.  RESPIRATORY: No cough, shortness of breath, wheezing or hemoptysis.  CARDIOVASCULAR: No chest pain, orthopnea, edema.  GASTROINTESTINAL: No nausea, vomiting, diarrhea or abdominal pain.  GENITOURINARY: No dysuria, hematuria.  ENDOCRINE: No polyuria, nocturia,  HEMATOLOGY: No anemia, easy bruising or bleeding SKIN: No rash or lesion. MUSCULOSKELETAL: No joint pain or arthritis.   NEUROLOGIC: No tingling, numbness, weakness.  PSYCHIATRY: No anxiety or depression.   DRUG ALLERGIES:  No Known Allergies  VITALS:  Blood pressure (!) 158/62, pulse (!) 58, temperature 98.4 F (36.9 C), temperature source Oral, resp. rate 16, height 5\' 6"  (1.676 m), weight 61 kg (134 lb 7.7 oz), SpO2 97 %.  PHYSICAL EXAMINATION:  GENERAL:  77 y.o.-year-old patient lying in the bed with no acute distress.  EYES: Pupils equal, round, reactive to light and accommodation. No scleral icterus. Extraocular muscles intact.  HEENT: Head atraumatic, normocephalic. Oropharynx and nasopharynx clear.  NECK:  Supple, no jugular venous distention. No thyroid enlargement, no tenderness.  LUNGS: Normal breath sounds bilaterally, no wheezing, rales,rhonchi or crepitation. No use of accessory muscles of respiration.  CARDIOVASCULAR: S1, S2 normal. No murmurs, rubs, or gallops.  Anterior chest wall with Port-A-Cath ABDOMEN: Soft, nontender, nondistended. Bowel sounds present.  EXTREMITIES: No pedal edema, cyanosis, or clubbing.  NEUROLOGIC: Cranial nerves II through XII are intact. Muscle  strength generalized weakness in all extremities. Sensation intact. Gait not checked.  PSYCHIATRIC: The patient is alert and oriented x 3.  SKIN: No obvious rash, lesion, or ulcer.    LABORATORY PANEL:   CBC Recent Labs  Lab 10/21/17 0747  WBC 11.5*  HGB 9.8*  HCT 30.7*  PLT 124*   ------------------------------------------------------------------------------------------------------------------  Chemistries  Recent Labs  Lab 10/20/17 1821 10/21/17 0747  NA 128* 128*  K 4.4 4.3  CL 92* 93*  CO2 26 25  GLUCOSE 252* 108*  BUN 10 13  CREATININE 0.56* 0.59*  CALCIUM 7.9* 7.9*  AST 38  --   ALT 82*  --   ALKPHOS 341*  --   BILITOT 0.4  --    ------------------------------------------------------------------------------------------------------------------  Cardiac Enzymes Recent Labs  Lab 10/21/17 0747  TROPONINI 0.04*   ------------------------------------------------------------------------------------------------------------------  RADIOLOGY:  No results found.  EKG:   Orders placed or performed in visit on 09/10/17  . EKG 12-Lead  . EKG 12-Lead  . 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  Patient and family have agreed to palliative care consult to explore resources for supportive measures.  Patient is not interested in hospice at this time.  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   Hiccups-Thorazine as needed     Arteriosclerosis of coronary artery -continue home meds,  continue Zetia  troponin was mildly elevated this  is demand ischemia, troponins are non-trending     HTN (hypertension) -continue home  medications      CKD (chronic kidney disease) stage 3, GFR 30-59 ml/min (HCC) -avoid nephrotoxins, monitor    GERD (gastroesophageal reflux disease) - PPI    HLD (hyperlipidemia) -continue Zocor and Zetia    Adult hypothyroidism -home dose thyroid replacement    Physical therapy consult for generalized weakness   All the records are reviewed and case discussed with Care Management/Social Workerr. Management plans discussed with the patient, family and they are in agreement.  CODE STATUS: dnr   TOTAL TIME TAKING CARE OF THIS PATIENT: 32  minutes.   POSSIBLE D/C IN 1-2 DAYS, DEPENDING ON CLINICAL CONDITION.  Note: This dictation was prepared with Dragon dictation along with smaller phrase technology. Any transcriptional errors that result from this process are unintentional.   Nicholes Mango M.D on 10/21/2017 at 1:06 PM  Between 7am to 6pm - Pager - [phone] After 6pm go to [email or web address] - password [email or web address] [facility]  Office  [phone]  CC: Primary care physician; [name], MD

## 2017-10-22 ENCOUNTER — Inpatient Hospital Stay: Payer: Medicare Other

## 2017-10-22 ENCOUNTER — Inpatient Hospital Stay: Payer: Medicare Other | Admitting: Oncology

## 2017-10-22 DIAGNOSIS — I823 Embolism and thrombosis of renal vein: Secondary | ICD-10-CM

## 2017-10-22 DIAGNOSIS — R531 Weakness: Secondary | ICD-10-CM

## 2017-10-22 DIAGNOSIS — R740 Nonspecific elevation of levels of transaminase and lactic acid dehydrogenase [LDH]: Secondary | ICD-10-CM

## 2017-10-22 DIAGNOSIS — E039 Hypothyroidism, unspecified: Secondary | ICD-10-CM

## 2017-10-22 LAB — BASIC METABOLIC PANEL
ANION GAP: 6 (ref 5–15)
BUN: 17 mg/dL (ref 6–20)
CHLORIDE: 98 mmol/L — AB (ref 101–111)
CO2: 26 mmol/L (ref 22–32)
Calcium: 7.2 mg/dL — ABNORMAL LOW (ref 8.9–10.3)
Creatinine, Ser: 0.66 mg/dL (ref 0.61–1.24)
GFR calc Af Amer: >60 mL/min (ref >60)
GFR calc non Af Amer: >60 mL/min (ref >60)
Glucose, Bld: 135 mg/dL — ABNORMAL HIGH (ref 65–99)
POTASSIUM: 4.2 mmol/L (ref 3.5–5.1)
Sodium: 130 mmol/L — ABNORMAL LOW (ref 135–145)

## 2017-10-22 LAB — TROPONIN I: Troponin I: 0.04 ng/mL (ref <0.03)

## 2017-10-22 LAB — URINE CULTURE

## 2017-10-22 LAB — CBC
HEMATOCRIT: 29.3 % — AB (ref 40.0–52.0)
HEMOGLOBIN: 9.1 g/dL — AB (ref 13.0–18.0)
MCH: 25.1 pg — ABNORMAL LOW (ref 26.0–34.0)
MCHC: 31.3 g/dL — ABNORMAL LOW (ref 32.0–36.0)
MCV: 80.4 fL (ref 80.0–100.0)
Platelets: 114 10*3/uL — ABNORMAL LOW (ref 150–440)
RBC: 3.64 MIL/uL — AB (ref 4.40–5.90)
RDW: 19.9 % — ABNORMAL HIGH (ref 11.5–14.5)
WBC: 10 10*3/uL (ref 3.8–10.6)

## 2017-10-22 MED ORDER — SODIUM CHLORIDE 0.9% FLUSH: 10.0000 mL | INTRAVENOUS | Status: DC | PRN

## 2017-10-22 MED ORDER — SODIUM CHLORIDE 0.9% FLUSH
10.0000 mL | Freq: Two times a day (BID) | INTRAVENOUS | Status: DC
  Administered 2017-10-22 – 2017-10-23 (×3): 10 mL

## 2017-10-22 NOTE — Plan of Care (Signed)
  Acute Rehab PT Goals(only PT should resolve) Pt Will Go Supine/Side To Sit 10/22/2017 0945 by Elza Rafter, PT Flowsheets Taken 10/22/2017 0945  Pt will go Supine/Side to Sit with min guard assist Note In order to improve functional mobility.    Acute Rehab PT Goals(only PT should resolve) Patient Will Transfer Sit To/From Stand 10/22/2017 0945 by Elza Rafter, PT Flowsheets Taken 10/22/2017 0945  Patient will transfer sit to/from stand with min guard assist Note With RW to improve functional mobility.    Acute Rehab PT Goals(only PT should resolve) Pt Will Ambulate 10/22/2017 0945 by Elza Rafter, PT Flowsheets Taken 10/22/2017 0945  Pt will Ambulate 50 feet;with rolling walker;with minimal assist Note To improve functional mobility.

## 2017-10-22 NOTE — Clinical Social Work Note (Signed)
Clinical Social Work Assessment  Patient Details  Name: Joseph Ritter MRN: [id number] Date of Birth: Mar 18, 1941  Date of referral:  10/22/17               Reason for consult:  Facility Placement, Discharge Planning                Permission sought to share information with:  Case Manager, Family Supports Permission granted to share information::  Yes, Verbal Permission Granted  Name::        Agency::     Relationship::  Wife at bedside along with friend and cousin  Contact Information:     Housing/Transportation Living arrangements for the past 2 months:  Single Family Home Source of Information:  Patient, Medical Team, Spouse, Friend/Neighbor Patient Interpreter Needed:  None Criminal Activity/Legal Involvement Pertinent to Current Situation/Hospitalization:  No - Comment as needed Significant Relationships:  Warehouse manager, Friend, Spouse, Other Family Members Lives with:  Spouse Do you feel safe going back to the place where you live?  Yes Need for family participation in patient care:  Yes (Comment)  Care giving concerns:  Patient admits from home due to increasing weakness and lower abdominal pain.  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  He is still seeking treatment for this and is currently in the process of seeking a second opinion.  Due to cancer and his progressive debilitation he has had an indwelling Foley catheter for the past several months.  He has been exchanging this once a month with urology.  Over the past week or so his weakness has progressed to the point that he is no longer able to stand up and walk around his house.  Here in the ED tonight he is found to have bandemia, and UA very suspicious for urinary infection.  He has had a number of urinary infections in his recent history  Patient is at home with his wife who is the full time caregiver.  Wife reports due to his  cancer and current conditions she would like for him to return home at discharge and she will continue taking care of him at home.  She reports patient [facility] had [facility] and has all DME except for a lift and a large bed pan in which she requests. She reports she continues to assist him with food, medications, cleaning him up, and carrying him to his appointments. She reports currently his oncology will possibly stop with referral to [facility] per MD thus his appointments will decrease potentially.  Wife reports she is comfortable taking him home and continuing to care for him.   Social Worker assessment / plan:  Assessment and consult completed for possible SNF placement.  At this time, patient and wife decline with plans to return home. LCSW educated both about SNF with regards to short 1-2 weeks stay, increase strength and progress back home, however wife continues to report he will feel more comfortable at home and care resuming at home.  Plan: Home with home health, DME CM aware of plan.  Employment status:  Retired Forensic scientist:  Medicare PT Recommendations:  [place] / Referral to community resources:  [place]  Patient/Family's Response to care:  Declines SNF placement at this time. Wanting to return home.  Patient/Family's Understanding of and Emotional Response to Diagnosis, Current Treatment, and Prognosis:  Wife voices she wants patient comfortable and feels  he will not do well at SNF at this time.  LCSW voiced concern with her taking care of him due to his baseline changing, however she reports he has additional help and wants him home.  Emotional Assessment Appearance:  Appears stated age Attitude/Demeanor/Rapport:  Apprehensive, Guarded Affect (typically observed):  Anxious, Withdrawn Orientation:  Oriented to Self, Oriented to Place, Oriented to  Time, Oriented to Situation Alcohol / Substance use:  Not  Applicable Psych involvement (Current and /or in the community):  No (Comment)  Discharge Needs  Concerns to be addressed:  Denies Needs/Concerns at this time Readmission within the last 30 days:  Yes Current discharge risk:  Chronically ill Barriers to Discharge:  No Barriers Identified   [place], LCSW 10/22/2017, 10:43 AM

## 2017-10-22 NOTE — Progress Notes (Signed)
Palliative Medicine consult noted. Due to high referral volume, there may be a delay seeing this patient. Please call the Palliative Medicine Team office at [phone] if recommendations are needed in the interim.  Thank you for inviting [place] to see this patient.  Marjie Skiff Ceriah Kohler, RN, BSN, [facility] Palliative Medicine Team 10/22/2017 1:01 PM Office [phone]

## 2017-10-22 NOTE — Progress Notes (Signed)
Patient is currently followed by outpatient Palliative at home. CMRN Brenda Holland made aware.  °Karen Robertson RN, BSN, CHPN °[facility], hospital Liaison °[phone] °

## 2017-10-22 NOTE — Progress Notes (Signed)
[facility] at [place] NAME: Joseph Ritter    MR#:  [id number]  DATE OF BIRTH:  04-11-1941  SUBJECTIVE:  CHIEF COMPLAINT: Patient is weak and tired, patient is having intermittent  hiccups  wife and sister-in-law at bedside  REVIEW OF SYSTEMS:  CONSTITUTIONAL: No fever, fatigue , reporting generalized weakness.  EYES: No blurred or double vision.  EARS, NOSE, AND THROAT: No tinnitus or ear pain.  RESPIRATORY: No cough, shortness of breath, wheezing or hemoptysis.  CARDIOVASCULAR: No chest pain, orthopnea, edema.  GASTROINTESTINAL: No nausea, vomiting, diarrhea or abdominal pain.  GENITOURINARY: No dysuria, hematuria.  ENDOCRINE: No polyuria, nocturia,  HEMATOLOGY: No anemia, easy bruising or bleeding SKIN: No rash or lesion. MUSCULOSKELETAL: No joint pain or arthritis.   NEUROLOGIC: No tingling, numbness, weakness.  PSYCHIATRY: No anxiety or depression.   DRUG ALLERGIES:  No Known Allergies  VITALS:  Blood pressure (!) 153/81, pulse 90, temperature 98.3 F (36.8 C), temperature source Oral, resp. rate (!) 22, height 5\' 6"  (1.676 m), weight 61 kg (134 lb 7.7 oz), SpO2 97 %.  PHYSICAL EXAMINATION:  GENERAL:  77 y.o.-year-old patient lying in the bed with no acute distress.  EYES: Pupils equal, round, reactive to light and accommodation. No scleral icterus. Extraocular muscles intact.  HEENT: Head atraumatic, normocephalic. Oropharynx and nasopharynx clear.  NECK:  Supple, no jugular venous distention. No thyroid enlargement, no tenderness.  LUNGS: Normal breath sounds bilaterally, no wheezing, rales,rhonchi or crepitation. No use of accessory muscles of respiration.  CARDIOVASCULAR: S1, S2 normal. No murmurs, rubs, or gallops.  Anterior chest wall with Port-A-Cath ABDOMEN: Soft, nontender, nondistended. Bowel sounds present.  EXTREMITIES: No pedal edema, cyanosis, or clubbing.  NEUROLOGIC: Cranial nerves II through XII are  intact. Muscle strength generalized weakness in all extremities. Sensation intact. Gait not checked.  PSYCHIATRIC: The patient is alert and oriented x 3.  SKIN: No obvious rash, lesion, or ulcer.    LABORATORY PANEL:   CBC Recent Labs  Lab 10/22/17 0401  WBC 10.0  HGB 9.1*  HCT 29.3*  PLT 114*   ------------------------------------------------------------------------------------------------------------------  Chemistries  Recent Labs  Lab 10/20/17 1821  10/22/17 0401  NA 128*   < > 130*  K 4.4   < > 4.2  CL 92*   < > 98*  CO2 26   < > 26  GLUCOSE 252*   < > 135*  BUN 10   < > 17  CREATININE 0.56*   < > 0.66  CALCIUM 7.9*   < > 7.2*  AST 38  --   --   ALT 82*  --   --   ALKPHOS 341*  --   --   BILITOT 0.4  --   --    < > = values in this interval not displayed.   ------------------------------------------------------------------------------------------------------------------  Cardiac Enzymes Recent Labs  Lab 10/22/17 0401  TROPONINI 0.04*   ------------------------------------------------------------------------------------------------------------------  RADIOLOGY:  No results found.  EKG:   Orders placed or performed in visit on 09/10/17  . EKG 12-Lead  . EKG 12-Lead  . EKG 12-Lead    ASSESSMENT AND PLAN:   UTI (urinary tract infection) -continue IV antibiotic Rocephin, follow-up on the urine culture and sensitivity and hydrate with IV fluids   Changed Foley catheter with a new one the old one was placed on January 9  Urine culture with multiple species, contaminated specimen   Transitional cell carcinoma of kidney, right ([place]) -status post chemotherapy  and radiation therapy  underlying cause of patient's progressive decline.   Patient and family have agreed to palliative care consult to explore resources for supportive measures.   Patient is not interested in hospice at this time. Oncology consult placed to Dr. 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  Continue sodium chloride tablets  Hiccups-Thorazine as needed     Arteriosclerosis of coronary artery -continue home meds,  continue Zetia  troponin was mildly elevated this is demand ischemia, troponins are non-trending     HTN (hypertension) -continue home medications      CKD (chronic kidney disease) stage 3, GFR 30-59 ml/min (HCC) -avoid nephrotoxins, monitor    GERD (gastroesophageal reflux disease) - PPI    HLD (hyperlipidemia) -continue Zocor and Zetia    Adult hypothyroidism -home dose thyroid replacement    Physical therapy consult for generalized weakness-recommending skilled nursing facility Follow-up with social worker  All the records are reviewed and case discussed with Care Management/Social Workerr. Management plans discussed with the patient, family and they are in agreement.  CODE STATUS: dnr   TOTAL TIME TAKING CARE OF THIS PATIENT: 32  minutes.   POSSIBLE D/C IN 1-2 DAYS, DEPENDING ON CLINICAL CONDITION.  Note: This dictation was prepared with Dragon dictation along with smaller phrase technology. Any transcriptional errors that result from this process are unintentional.   Nicholes Mango M.D on 10/22/2017 at 8:24 PM  Between 7am to 6pm - Pager - [phone] After 6pm go to [email or web address] - password [email or web address] [facility]  Office  [phone]  CC: Primary care physician; [name], MD

## 2017-10-22 NOTE — Consult Note (Signed)
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  He only received one treatment with Keytruda and the liver function goes up. Repeat images reviewed new liver lesions which can contribute to the transaminitis.   Genetic test has been previously discussed which has not been obtained as patient being overwhelmed with multiple symptoms from his metastatic disease. Patient has had poor performance status, and hospice has been discussed. Patient is currently in the process of seeking second opinion and we have referred patient to [facility] by Dr. Aline Brochure.. Patient presented emergency room due to profound weakness to the point that he can no longer sit up and walk.  Lab reviewed neutrophilia, UA positive.  Patient is admitted to the hospital for possible UTI.  Patient sitting the bed eating lunch when I stop by today.  Wife and other family members at bedside.  Patient reports feeling little better and appetite is slightly improved.  Review of systems- Review of Systems  Constitutional: Positive for malaise/fatigue. Negative for chills and fever.  HENT: Negative for hearing loss.   Eyes: Negative for blurred vision.  Respiratory: Negative for shortness of breath.   Cardiovascular: Negative for chest pain.  Gastrointestinal: Negative for heartburn.  Genitourinary: Negative for dysuria.  Musculoskeletal: Negative for myalgias.  Skin: Negative for rash.  Neurological: Positive for weakness.  Negative for dizziness.  Endo/Heme/Allergies: Does not bruise/bleed easily.  Psychiatric/Behavioral: The patient is not nervous/anxious.     No Known Allergies  Patient Active Problem List   Diagnosis Date Noted  . Malnutrition of moderate degree 09/12/2017  . UTI (urinary tract infection) 09/10/2017  . Sepsis secondary to UTI ([place]) 07/07/2017  . Malignant neoplasm of right kidney ([place])   . Goals of care, counseling/discussion 05/25/2017  . 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 CKD (chronic kidney disease) stage 3, GFR 30-59 ml/min (HCC) 05/08/2017  . Transitional cell carcinoma of kidney, right ([place]) 05/02/2017  . 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 Gross hematuria 08/15/2016  . Dysuria 08/15/2016  . Acute cystitis with hematuria 08/15/2016  . CAFL (chronic airflow limitation) ([place]) 05/03/2015  . Clinical depression 05/03/2015  . 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 DDD (degenerative disc disease), lumbar 06/29/2014  . Neuritis or radiculitis due to rupture of lumbar intervertebral disc 06/29/2014  . 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 Borderline diabetes 05/25/2014  . Blood glucose elevated 05/25/2014  . GERD (gastroesophageal reflux disease) 03/09/2014  . Gastric ulcer 03/09/2014  . CCF (congestive cardiac failure) ([place]) 01/03/2014  . HLD (hyperlipidemia) 01/03/2014  . 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 Subacute transverse myelitis ([place]) 11/21/2012  . 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 Arteriosclerosis of coronary artery 11/18/2012  . HTN (hypertension) 11/18/2012  . Adult hypothyroidism 11/18/2012  . Incomplete bladder emptying 11/18/2012  . Leg weakness 11/18/2012  . H/O coronary artery bypass surgery 04/07/2002     Past Medical History:  Diagnosis Date  . Acid indigestion 03/09/2014  . Adult hypothyroidism 11/18/2012  . Anxiety   .  Arteriosclerosis of coronary artery 11/18/2012   Overview:  PATENT  LIMA TO LAD, PATENT SVG TO PDA AND OCCULUDED SVG TO OM2 BY CATHERIZATION 02/09/2009   . 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 Borderline diabetes 05/25/2014  . BP (high blood pressure) 11/18/2012  . BPH (benign prostatic hypertrophy)   . CAFL (chronic airflow limitation) ([place]) 05/03/2015  . Cancer ([place])    renal cancer with mets  . CCF (congestive cardiac failure) ([place]) 01/03/2014   Overview:  HX OF   . COPD (chronic obstructive pulmonary disease) ([place])   . DDD (degenerative disc disease), lumbar 06/29/2014  . 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 Gastric ulcer 03/09/2014  . 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 History of urinary self-catheterization 2017  . HLD (hyperlipidemia) 01/03/2014  . Incomplete bladder emptying 11/18/2012  . Leg weakness 11/18/2012  . Lumbar radiculitis   . Neuritis or radiculitis due to rupture of lumbar intervertebral disc 06/29/2014  . Overactive bladder   . PONV (postoperative nausea and vomiting)    years ago with Ether, no problem with Nausea or vomiting with the last few surgerys  . Pre-diabetes   . Subacute transverse myelitis ([place]) 11/21/2012  . Teeth problem    pt reports "bad teeth", "need to be pulled"     Past Surgical History:  Procedure Laterality Date  . ARTERY BIOPSY Right 12/16/2015   Procedure: BIOPSY TEMPORAL ARTERY;  Surgeon: Algernon Huxley, MD;  Location: [facility];  Service: Vascular;  Laterality: Right;  . CARDIAC CATHETERIZATION    . CATARACT EXTRACTION W/PHACO Left 01/26/2016   Procedure: CATARACT EXTRACTION PHACO AND INTRAOCULAR LENS PLACEMENT (IOC) LEFT EYE;  Surgeon: Leandrew Koyanagi, MD;  Location: [place];  Service: Ophthalmology;  Laterality: Left;  . CORONARY ARTERY BYPASS GRAFT  04/07/2002   [facility]  . Cysto Bladder Botox Injection  07/02/2014  . CYSTOSCOPY W/ RETROGRADES Right  03/16/2017   Procedure: CYSTOSCOPY WITH RETROGRADE PYELOGRAM;  Surgeon: [name], MD;  Location: [facility];  Service: Urology;  Laterality: Right;  . CYSTOSCOPY WITH STENT PLACEMENT Right 03/16/2017   Procedure: CYSTOSCOPY WITH STENT PLACEMENT;  Surgeon: [name], MD;  Location: [facility];  Service: Urology;  Laterality: Right;  . EYE SURGERY Left    blood clot behind left eye  . HAND SURGERY Bilateral   . HERNIA REPAIR Right    inguinal hernia repair  . LEFT HEART CATH AND CORS/GRAFTS ANGIOGRAPHY N/A 04/25/2017   Procedure: Left Heart Cath and Cors/Grafts Angiography;  Surgeon: Isaias Cowman, MD;  Location: [place] [facility];  Service: Cardiovascular;  Laterality: N/A;  . PORTA CATH INSERTION N/A 05/30/2017   Procedure: Glori Luis Cath Insertion;  Surgeon: Katha Cabal, MD;  Location: [place] [facility];  Service: Cardiovascular;  Laterality: N/A;  . ROBOT ASSITED LAPAROSCOPIC NEPHROURETERECTOMY Right 05/02/2017   Procedure: ROBOT ASSITED LAPAROSCOPIC NEPHROURETERECTOMY ATTEMPTED;  Surgeon: [name], MD;  Location: [facility];  Service: Urology;  Laterality: Right;  . URETEROSCOPY Right 03/16/2017   Procedure: URETEROSCOPY BIOPSY RENAL MASS;  Surgeon: [name], MD;  Location: [facility];  Service: Urology;  Laterality: Right;    Social History   Socioeconomic History  . Marital status: Married    Spouse name: Not on file  . Number of children: Not on file  . Years of education: Not  on file  . Highest education level: Not on file  Social Needs  . Financial resource strain: Not on file  . Food insecurity - worry: Not on file  . Food insecurity - inability: Not on file  . Transportation needs - medical: Not on file  . Transportation needs - non-medical: Not on file  Occupational History  . Not on file  Tobacco Use  . Smoking status: Former Smoker    Types: Cigarettes    Last attempt to quit: 04/07/2002    Years since quitting: 15.5  .  Smokeless tobacco: Former Systems developer    Types: Snuff, Chew  Substance and Sexual Activity  . Alcohol use: No    Alcohol/week: 0.0 oz  . Drug use: No  . Sexual activity: Not on file  Other Topics Concern  . Not on file  Social History Narrative  . Not on file     Family History  Problem Relation Age of Onset  . Heart attack Mother   . Heart disease Father   . Prostate cancer Neg Hx   . Bladder Cancer Neg Hx   . Kidney cancer Neg Hx      Current Facility-Administered Medications:  .  acetaminophen (TYLENOL) tablet 650 mg, 650 mg, Oral, Q6H PRN **OR** acetaminophen (TYLENOL) suppository 650 mg, 650 mg, Rectal, Q6H PRN, Lance Coon, MD .  apixaban [facility]ne [place]) tablet 5 mg, 5 mg, Oral, BID, Lance Coon, MD, 5 mg at 10/22/17 0909 .  buPROPion [facility] SR) 12 hr tablet 100 mg, 100 mg, Oral, Daily, Lance Coon, MD, 100 mg at 10/22/17 0907 .  cefTRIAXone (ROCEPHIN) 1 g in dextrose 5 % 50 mL IVPB, 1 g, Intravenous, Q24H, Lance Coon, MD, Stopped at 10/21/17 1741 .  chlorproMAZINE (THORAZINE) 12.5 mg in sodium chloride 0.9 % 25 mL IVPB, 12.5 mg, Intravenous, Q8H PRN, Gouru, Aruna, MD, Last Rate: 50 mL/hr at 10/22/17 1138, 12.5 mg at 10/22/17 1138 .  cyclobenzaprine (FLEXERIL) tablet 10 mg, 10 mg, Oral, QHS PRN, Lance Coon, MD .  ezetimibe (ZETIA) tablet 10 mg, 10 mg, Oral, QHS, 10 mg at 10/21/17 2242 **AND** simvastatin (ZOCOR) tablet 40 mg, 40 mg, Oral, QHS, Lance Coon, MD, 40 mg at 10/21/17 2242 .  hyoscyamine (ANASPAZ) disintergrating tablet 0.125 mg, 0.125 mg, Sublingual, Q6H PRN, Lance Coon, MD .  levothyroxine (SYNTHROID, LEVOTHROID) tablet 137 mcg, 137 mcg, Oral, QAC breakfast, Lance Coon, MD, 137 mcg at 10/22/17 [phone] .  lidocaine (LIDODERM) 5 % 1 patch, 1 patch, Transdermal, Q24H, Gouru, Aruna, MD, 1 patch at 10/22/17 1138 .  menthol-cetylpyridinium (CEPACOL) lozenge 3 mg, 1 lozenge, Oral, PRN, Gouru, Aruna, MD .  morphine (MS CONTIN) 12 hr tablet 60 mg, 60 mg, Oral,  Q12H, Lance Coon, MD, 60 mg at 10/22/17 0904 .  morphine (MSIR) tablet 15 mg, 15 mg, Oral, Q6H PRN, Lance Coon, MD, 15 mg at 10/21/17 2350 .  nystatin (MYCOSTATIN) 100000 UNIT/ML suspension 500,000 Units, 5 mL, Oral, QID, Gouru, Aruna, MD, 500,000 Units at 10/22/17 1138 .  ondansetron (ZOFRAN) tablet 8 mg, 8 mg, Oral, Q8H PRN **OR** ondansetron (ZOFRAN) 8 mg in sodium chloride 0.9 % 50 mL IVPB, 8 mg, Intravenous, Q8H PRN, Lance Coon, MD .  pantoprazole (PROTONIX) EC tablet 40 mg, 40 mg, Oral, Daily, Lance Coon, MD, 40 mg at 10/22/17 0908 .  predniSONE (DELTASONE) tablet 10 mg, 10 mg, Oral, Q breakfast, Gouru, Aruna, MD, 10 mg at 10/22/17 0909 .  promethazine (PHENERGAN) injection 12.5 mg, 12.5 mg, Intravenous, Q6H PRN,  Lance Coon, MD .  [facility]) tablet 17.2 mg, 2 tablet, Oral, Daily, Lance Coon, MD, 17.2 mg at 10/22/17 0908 .  sertraline (ZOLOFT) tablet 100 mg, 100 mg, Oral, QPC breakfast, Lance Coon, MD, 100 mg at 10/22/17 0908 .  sodium chloride tablet 1 g, 1 g, Oral, BID WC, Gouru, Aruna, MD, 1 g at 10/22/17 0907 .  tamsulosin (FLOMAX) capsule 0.8 mg, 0.8 mg, Oral, QPC supper, Lance Coon, MD, 0.8 mg at 10/21/17 1710  Facility-Administered Medications Ordered in Other Encounters:  .  heparin lock flush 100 unit/mL, 500 Units, Intravenous, Once, Earlie Server, MD .  sodium chloride flush (NS) 0.9 % injection 10 mL, 10 mL, Intravenous, PRN, Earlie Server, MD, 10 mL at 07/03/17 0930   Physical exam:  Vitals:   10/21/17 2048 10/22/17 0349 10/22/17 0740 10/22/17 1003  BP: 140/75 124/62 130/72   Pulse: (!) 104 (!) 102 (!) 106 (!) 118  Resp: 16 13 14 19   Temp:  98.9 F (37.2 C) 97.9 F (36.6 C)   TempSrc:   Oral   SpO2: 94% 95% 95%   Weight:      Height:       GENERAL:Alert, no distress and comfortable.  EYES: +pallor, no icterus OROPHARYNX: no thrush or ulceration; good dentition  NECK: supple, no masses felt LUNGS: clear to auscultation and  No wheeze or  crackles HEART/CVS: regular rate & rhythm and no murmurs;  ABDOMEN: abdomen soft, non-tender and normal bowel sounds Musculoskeletal:no cyanosis of digits and no clubbing. left lower extremity + edema. No calf pain. PSYCH: alert & oriented x 3  NEURO: chronic lower extremity weakness SKIN:  no rashes or significant lesions     CMP Latest Ref Rng & Units 10/22/2017  Glucose 65 - 99 mg/dL 135(H)  BUN 6 - 20 mg/dL 17  Creatinine 0.61 - 1.24 mg/dL 0.66  Sodium 135 - 145 mmol/L 130(L)  Potassium 3.5 - 5.1 mmol/L 4.2  Chloride 101 - 111 mmol/L 98(L)  CO2 22 - 32 mmol/L 26  Calcium 8.9 - 10.3 mg/dL 7.2(L)  Total Protein 6.5 - 8.1 g/dL -  Total Bilirubin 0.3 - 1.2 mg/dL -  Alkaline Phos 38 - 126 U/L -  AST 15 - 41 U/L -  ALT 17 - 63 U/L -   CBC Latest Ref Rng & Units 10/22/2017  WBC 3.8 - 10.6 K/uL 10.0  Hemoglobin 13.0 - 18.0 g/dL 9.1(L)  Hematocrit 40.0 - 52.0 % 29.3(L)  Platelets 150 - 440 K/uL 114(L)     Ct Abdomen Pelvis W Contrast  Result Date: 10/01/2017 CLINICAL DATA:  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 CONTRAST:  135mL ISOVUE-300 IOPAMIDOL (ISOVUE-300) INJECTION 61% COMPARISON:  07/06/2017 FINDINGS: Lower chest: Lung bases are clear. Hepatobiliary: Multiple new round lesions in the liver. Largest lesion the RIGHT hepatic lobe measures 5.8 x 5.4 cm. Example lesion in the LEFT hepatic lobe measures 2.7 cm. Approximate 8 lesions in the liver. Pancreas: Pancreas is normal. No ductal dilatation. No pancreatic inflammation. Spleen: Normal spleen Adrenals/urinary tract: Adrenal glands normal. Lobular mass involving the RIGHT hilum and renal cortex measures 4.6 x 2.9 cm compared with 4.6 x 3.0 cm. LEFT kidney is normal. Foley catheter within the bladder.  Stomach/Bowel: Stomach, small bowel, appendix, and cecum are normal. The colon and rectosigmoid colon are normal. Vascular/Lymphatic: Abdominal  aorta is normal. There is poor definite of the IVC at the level of the RIGHT kidney. Possible tumor involvement (image 32 through 29 of series 3) Reproductive: Prostate normal Other: No peritoneal metastasis. Musculoskeletal: Aggressive lesion in the L2 vertebral body. Potentially new sclerotic lesion in T10 vertebral body. IMPRESSION: 1. New bilobed hepatic metastasis. 2. RIGHT renal cortical and hilar mass is similar. 3. Concern for involvement of the IVC and RIGHT renal vein. Similar findings. 4. Skeletal metastasis at L2. Probable new sclerotic metastasis at T 10 . These results will be called to the ordering clinician or representative by the Radiologist Assistant, and communication documented in the PACS or zVision Dashboard. Electronically Signed   By: Suzy Bouchard M.D.   On: 10/01/2017 15:23    Assessment and plan- Patient is a 77 y.o. male with metastatic transitional cell carcinoma of kidney presents with generalized weakness  # UTI?: urine culture is positive for multiple organism, likely colonization from chronic indwelling catheter, rather than acute infection.  # generalized weakness: likely due to disease progression.  # Metastatic transitional cell carcinoma: goal of care has been discussed with patient and wife, patient's current poor performance does not allow chemotherapy and he only received one treatment of immunotherapy and treatment is held due to elevated liver enzymes. He has been on steroid tapering course. Immunotherapy can be tried again once his steroids are tapered to less than 10mg  daily.  Patient understands his prognosis and hope to obtain second opinion and I have referred to him see Dr.Harrison or colleague at [facility].   # Transaminitis started after one cycle of immunotherapy and he has been on steroids. LFTs improving. Slowing  tapering down 10mg  per week. wil continue him on 10mg  daily this week.   # Hypothyroidism: TSH was 10.6. I increase his synthroid lately and will continue to titrate.  # IVC and renal vein tumor thrombosis: continue Eliquis 5mg  BID.   Thank you for this kind referral and the opportunity to participate in the care of this patient  Earlie Server, MD, PhD Hematology [place] [facility] Pager- [phone] 10/22/2017

## 2017-10-22 NOTE — Care Management Important Message (Signed)
Important Message  Patient Details  Name: Joseph Ritter MRN: [id number] Date of Birth: [birth year]   Medicare Important Message Given:  Yes    Shelbie Ammons, RN 10/22/2017, 7:25 AM

## 2017-10-22 NOTE — Evaluation (Signed)
Physical Therapy Evaluation Patient Details Name: Joseph Ritter MRN: [id number] DOB: 1940/10/09 Today's Date: 10/22/2017   History of Present Illness  Pt is a 77 y.o. male presenting to hospital with weakness and UTI.  Pt's troponin levels slightly elevated but PT spoke with nursing before and after treatment. 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  Clinical Impression  Pt limited today 2/2 elevated HR at rest (118-123bpm), while supine with HOB slightly elevated. Pt's wife provided most of hx 2/2 pt having difficulty with memory. Pt has fallen once in the last 6 months 2/2 weakness, which began last week. Pt is currently receiving treatment for metastatic kidney CA. Pt has been requiring incr. Assist during all bed mobility, txfs, and ambulates short distances with rollator at home. Pt able to move BUE/LE against gravity while supine in bed, but became fatigued and HR remained elevated. Therefore, PT unable to formally assess bed mobility, txfs, and gait 2/2 elevated HR but is recommending SNF 2/2 pt having difficulty with mobility prior to admission. Pt would benefit from skilled PT to address above deficits and promote optimal return to PLOF     Follow Up Recommendations SNF(PT spoke with case manager re: SNF)    Equipment Recommendations  None recommended by PT    Recommendations for Other Services       Precautions / Restrictions Precautions Precautions: Other (comment);Fall(Port-a-cath, elevated HR at rest ) Precaution Comments: Nursing is aware of pt's elevated HR at rest (118-123bpm) in supine. Restrictions Weight Bearing Restrictions: No      Mobility  Bed Mobility Overal bed mobility: (not assessed)             General bed mobility comments: Pt's resting HR elevated, therefore, not assessed. However, pt's wife reports pt required assist for bed  mobility and txfs starting last week 2/2 weakness.  Transfers                 General transfer comment: not performed  Ambulation/Gait Ambulation/Gait assistance: (not assessed)              Stairs            Wheelchair Mobility    Modified Rankin (Stroke Patients Only)       Balance                                             Pertinent Vitals/Pain Pain Assessment: No/denies pain    Home Living Family/patient expects to be discharged to:: Private residence Living Arrangements: Spouse/significant other Available Help at Discharge: Family Type of Home: House Home Access: Stairs to enter Entrance Stairs-Rails: None Entrance Stairs-Number of Steps: 1 small step and then 1 more small step Home Layout: One level Home Equipment: [place] - 4 wheels;Cane - single point;Walker - 2 wheels;Hospital bed;Bedside commode      Prior Function Level of Independence: Independent with assistive device(s)         Comments: Pt rarely out of the house.  Walks short distances with RW (limited d/t pain and weakness)     Hand Dominance        Extremity/Trunk Assessment   Upper Extremity Assessment Upper Extremity Assessment: Generalized weakness    Lower Extremity Assessment Lower Extremity Assessment: RLE deficits/detail;LLE deficits/detail RLE Deficits / Details: B MMT not formally  tested 2/2 elevated HR but pt able to perform hip flex, knee ext/flex and ankle DF/PF within functional limits while supine in bed.  RLE Sensation: (WNL) LLE Deficits / Details: see above. LLE Sensation: (WNL)       Communication   Communication: (Oriented to place, name, but couldn't DOB)  Cognition Arousal/Alertness: Awake/alert Behavior During Therapy: WFL for tasks assessed/performed Overall Cognitive Status: Within Functional Limits for tasks assessed                                 General Comments: Did have some difficulty providing hx  (impaired memory), wife provided most of the pt's hx.       General Comments      Exercises     Assessment/Plan    PT Assessment Patient needs continued PT services  PT Problem List Decreased strength;Decreased activity tolerance;Decreased balance;Decreased mobility;Decreased cognition;Decreased knowledge of use of DME       PT Treatment Interventions DME instruction;Gait training;Stair training;Functional mobility training;Therapeutic activities;Therapeutic exercise;Balance training;Neuromuscular re-education;Patient/family education;Cognitive remediation;Manual techniques    PT Goals (Current goals can be found in the Care Plan section)  Acute Rehab PT Goals Patient Stated Goal: To get stronger PT Goal Formulation: With patient Time For Goal Achievement: 11/05/17 Potential to Achieve Goals: Fair    Frequency Min 2X/week   Barriers to discharge Inaccessible home environment(2 steps)      Co-evaluation               AM-PAC PT "6 Clicks" Daily Activity  Outcome Measure Difficulty turning over in bed (including adjusting bedclothes, sheets and blankets)?: A Lot Difficulty moving from lying on back to sitting on the side of the bed? : A Lot Difficulty sitting down on and standing up from a chair with arms (e.g., wheelchair, bedside commode, etc,.)?: Unable Help needed moving to and from a bed to chair (including a wheelchair)?: Total Help needed walking in hospital room?: Total Help needed climbing 3-5 steps with a railing? : Total 6 Click Score: 8    End of Session   Activity Tolerance: Patient limited by fatigue;Other (comment)(limited 2/2 elevated HR at rest) Patient left: in bed;with call bell/phone within reach;with family/visitor present(bed alarm not set upon entering room) Nurse Communication: Other (comment)(elevated HR at rest) PT Visit Diagnosis: Other abnormalities of gait and mobility (R26.89);Muscle weakness (generalized) (M62.81)    Time:  4315-4008 PT Time Calculation (min) (ACUTE ONLY): 14 min   Charges:   PT Evaluation $PT Eval Low Complexity: 1 Low     PT G Codes:   PT G-Codes **NOT FOR INPATIENT CLASS** Functional Assessment Tool Used: AM-PAC 6 Clicks Basic Mobility Functional Limitation: Mobility: Walking and moving around Mobility: Walking and Moving Around Current Status (Q7619): At least 80 percent but less than 100 percent impaired, limited or restricted Mobility: Walking and Moving Around Goal Status [phone]): At least 60 percent but less than 80 percent impaired, limited or restricted    Geoffry Paradise, PT,DPT 10/22/17 10:05 AM     Adisyn Ruscitti L 10/22/2017, 10:00 AM

## 2017-10-23 DIAGNOSIS — C641 Malignant neoplasm of right kidney, except renal pelvis: Secondary | ICD-10-CM

## 2017-10-23 DIAGNOSIS — N183 Chronic kidney disease, stage 3 (moderate): Secondary | ICD-10-CM

## 2017-10-23 DIAGNOSIS — R531 Weakness: Secondary | ICD-10-CM

## 2017-10-23 DIAGNOSIS — Z7189 Other specified counseling: Secondary | ICD-10-CM

## 2017-10-23 DIAGNOSIS — Z515 Encounter for palliative care: Secondary | ICD-10-CM

## 2017-10-23 DIAGNOSIS — N390 Urinary tract infection, site not specified: Secondary | ICD-10-CM

## 2017-10-23 DIAGNOSIS — R066 Hiccough: Secondary | ICD-10-CM

## 2017-10-23 LAB — BASIC METABOLIC PANEL
ANION GAP: 9 (ref 5–15)
BUN: 15 mg/dL (ref 6–20)
CO2: 24 mmol/L (ref 22–32)
Calcium: 7.1 mg/dL — ABNORMAL LOW (ref 8.9–10.3)
Chloride: 97 mmol/L — ABNORMAL LOW (ref 101–111)
Creatinine, Ser: 0.64 mg/dL (ref 0.61–1.24)
GFR calc Af Amer: >60 mL/min (ref >60)
GLUCOSE: 143 mg/dL — AB (ref 65–99)
Potassium: 3.9 mmol/L (ref 3.5–5.1)
Sodium: 130 mmol/L — ABNORMAL LOW (ref 135–145)

## 2017-10-23 MED ORDER — CEPHALEXIN 500 MG PO CAPS
500.0000 mg | ORAL_CAPSULE | Freq: Two times a day (BID) | ORAL | Status: DC
  Administered 2017-10-23 – 2017-10-24 (×3): 500 mg via ORAL
  Filled 2017-10-23 (×3): qty 1

## 2017-10-23 MED ORDER — BACLOFEN 10 MG PO TABS
10.0000 mg | ORAL_TABLET | Freq: Three times a day (TID) | ORAL | Status: DC
  Administered 2017-10-23 – 2017-10-24 (×3): 10 mg via ORAL
  Filled 2017-10-23 (×5): qty 1

## 2017-10-23 NOTE — Care Management (Signed)
Spoke with wife, Colter Magowan at the bedside. Discussed Hospice agencies. [place]. Flo Shanks, RN representative for [facility] updated Discharge to home today per Dr. Margaretmary Eddy. Transportation per [place] RN MSN CCM Care Management [phone]

## 2017-10-23 NOTE — Progress Notes (Signed)
Hematology/Oncology Progress Note [facility] [phone] [phone]  Patient Care Team: [name], MD as PCP - General (Physician Assistant)   Name of the patient: Joseph Ritter  [id number]  May 02, 1941  Date of visit: 10/23/17   INTERVAL HISTORY- Patient was seen and examined at bedside. Wife reports patient has persistent hiccups and has not get symptom relief from Thorazine. Did not sleep well due to hiccups. Both legs have swelling.     Review of systems- Review of Systems  Constitutional: Positive for malaise/fatigue.  HENT: Negative for hearing loss.   Eyes: Negative for double vision.  Respiratory: Negative for cough and shortness of breath.   Cardiovascular: Negative for chest pain.  Gastrointestinal: Negative for abdominal pain and heartburn.  Genitourinary: Negative for dysuria.  Musculoskeletal: Negative for myalgias.  Skin: Negative for rash.  Neurological: Positive for weakness. Negative for tingling.  Psychiatric/Behavioral: The patient has insomnia.     No Known Allergies  Patient Active Problem List   Diagnosis Date Noted  . Weakness   . Malnutrition of moderate degree 09/12/2017  . UTI (urinary tract infection) 09/10/2017  . Sepsis secondary to UTI ([place]) 07/07/2017  . Malignant neoplasm of right kidney ([place])   . Goals of care, counseling/discussion 05/25/2017  . 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 CKD (chronic kidney disease) stage 3, GFR 30-59 ml/min (HCC) 05/08/2017  . Transitional cell carcinoma of kidney, right ([place]) 05/02/2017  . 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 Gross hematuria 08/15/2016  . Dysuria 08/15/2016  . Acute cystitis with hematuria 08/15/2016  . CAFL (chronic airflow limitation) ([place]) 05/03/2015  . Clinical depression 05/03/2015  . 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 DDD (degenerative disc disease), lumbar 06/29/2014   . Neuritis or radiculitis due to rupture of lumbar intervertebral disc 06/29/2014  . 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 Borderline diabetes 05/25/2014  . Blood glucose elevated 05/25/2014  . GERD (gastroesophageal reflux disease) 03/09/2014  . Gastric ulcer 03/09/2014  . CCF (congestive cardiac failure) ([place]) 01/03/2014  . HLD (hyperlipidemia) 01/03/2014  . 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 Subacute transverse myelitis ([place]) 11/21/2012  . 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 Arteriosclerosis of coronary artery 11/18/2012  . HTN (hypertension) 11/18/2012  . Adult hypothyroidism 11/18/2012  . Incomplete bladder emptying 11/18/2012  . Leg weakness 11/18/2012  . H/O coronary artery bypass surgery 04/07/2002     Past Medical History:  Diagnosis Date  . Acid indigestion 03/09/2014  . Adult hypothyroidism 11/18/2012  . Anxiety   . Arteriosclerosis of coronary artery 11/18/2012   Overview:  PATENT LIMA TO LAD, PATENT SVG TO PDA AND OCCULUDED SVG TO OM2 BY CATHERIZATION 02/09/2009   . 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 Borderline diabetes 05/25/2014  . BP (high blood pressure) 11/18/2012  . BPH (benign prostatic hypertrophy)   . CAFL (chronic airflow limitation) ([place]) 05/03/2015  . Cancer ([place])    renal cancer with mets  . CCF (congestive cardiac failure) ([place]) 01/03/2014   Overview:  HX OF   . COPD (chronic obstructive pulmonary disease) ([place])   . DDD (degenerative disc disease), lumbar 06/29/2014  . 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 Gastric ulcer 03/09/2014  . 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 History of urinary self-catheterization 2017  . HLD (  hyperlipidemia) 01/03/2014  . Incomplete bladder emptying 11/18/2012  . Leg weakness 11/18/2012  . Lumbar radiculitis   . Neuritis or radiculitis due to rupture of lumbar intervertebral  disc 06/29/2014  . Overactive bladder   . PONV (postoperative nausea and vomiting)    years ago with Ether, no problem with Nausea or vomiting with the last few surgerys  . Pre-diabetes   . Subacute transverse myelitis ([place]) 11/21/2012  . Teeth problem    pt reports "bad teeth", "need to be pulled"     Past Surgical History:  Procedure Laterality Date  . ARTERY BIOPSY Right 12/16/2015   Procedure: BIOPSY TEMPORAL ARTERY;  Surgeon: Algernon Huxley, MD;  Location: [facility];  Service: Vascular;  Laterality: Right;  . CARDIAC CATHETERIZATION    . CATARACT EXTRACTION W/PHACO Left 01/26/2016   Procedure: CATARACT EXTRACTION PHACO AND INTRAOCULAR LENS PLACEMENT (IOC) LEFT EYE;  Surgeon: Leandrew Koyanagi, MD;  Location: [place];  Service: Ophthalmology;  Laterality: Left;  . CORONARY ARTERY BYPASS GRAFT  04/07/2002   [facility]  . Cysto Bladder Botox Injection  07/02/2014  . CYSTOSCOPY W/ RETROGRADES Right 03/16/2017   Procedure: CYSTOSCOPY WITH RETROGRADE PYELOGRAM;  Surgeon: [name], MD;  Location: [facility];  Service: Urology;  Laterality: Right;  . CYSTOSCOPY WITH STENT PLACEMENT Right 03/16/2017   Procedure: CYSTOSCOPY WITH STENT PLACEMENT;  Surgeon: [name], MD;  Location: [facility];  Service: Urology;  Laterality: Right;  . EYE SURGERY Left    blood clot behind left eye  . HAND SURGERY Bilateral   . HERNIA REPAIR Right    inguinal hernia repair  . LEFT HEART CATH AND CORS/GRAFTS ANGIOGRAPHY N/A 04/25/2017   Procedure: Left Heart Cath and Cors/Grafts Angiography;  Surgeon: Isaias Cowman, MD;  Location: [place] [facility];  Service: Cardiovascular;  Laterality: N/A;  . PORTA CATH INSERTION N/A 05/30/2017   Procedure: Glori Luis Cath Insertion;  Surgeon: Katha Cabal, MD;  Location: [place] [facility];  Service: Cardiovascular;  Laterality: N/A;  . ROBOT ASSITED LAPAROSCOPIC NEPHROURETERECTOMY Right 05/02/2017   Procedure: ROBOT ASSITED LAPAROSCOPIC  NEPHROURETERECTOMY ATTEMPTED;  Surgeon: [name], MD;  Location: [facility];  Service: Urology;  Laterality: Right;  . URETEROSCOPY Right 03/16/2017   Procedure: URETEROSCOPY BIOPSY RENAL MASS;  Surgeon: [name], MD;  Location: [facility];  Service: Urology;  Laterality: Right;    Social History   Socioeconomic History  . Marital status: Married    Spouse name: Not on file  . Number of children: Not on file  . Years of education: Not on file  . Highest education level: Not on file  Social Needs  . Financial resource strain: Not on file  . Food insecurity - worry: Not on file  . Food insecurity - inability: Not on file  . Transportation needs - medical: Not on file  . Transportation needs - non-medical: Not on file  Occupational History  . Not on file  Tobacco Use  . Smoking status: Former Smoker    Types: Cigarettes    Last attempt to quit: 04/07/2002    Years since quitting: 15.5  . Smokeless tobacco: Former Systems developer    Types: Snuff, Chew  Substance and Sexual Activity  . Alcohol use: No    Alcohol/week: 0.0 oz  . Drug use: No  . Sexual activity: Not on file  Other Topics Concern  . Not on file  Social History Narrative  . Not on file     Family  History  Problem Relation Age of Onset  . Heart attack Mother   . Heart disease Father   . Prostate cancer Neg Hx   . Bladder Cancer Neg Hx   . Kidney cancer Neg Hx      Current Facility-Administered Medications:  .  acetaminophen (TYLENOL) tablet 650 mg, 650 mg, Oral, Q6H PRN **OR** acetaminophen (TYLENOL) suppository 650 mg, 650 mg, Rectal, Q6H PRN, Lance Coon, MD .  apixaban Arne [place]) tablet 5 mg, 5 mg, Oral, BID, Lance Coon, MD, 5 mg at 10/23/17 0901 .  buPROPion [facility] SR) 12 hr tablet 100 mg, 100 mg, Oral, Daily, Lance Coon, MD, 100 mg at 10/23/17 0902 .  cefTRIAXone (ROCEPHIN) 1 g in dextrose 5 % 50 mL IVPB, 1 g, Intravenous, Q24H, Lance Coon, MD, Stopped at 10/22/17 1641 .   chlorproMAZINE (THORAZINE) 12.5 mg in sodium chloride 0.9 % 25 mL IVPB, 12.5 mg, Intravenous, Q8H PRN, Nicholes Mango, MD, Stopped at 10/23/17 0218 .  cyclobenzaprine (FLEXERIL) tablet 10 mg, 10 mg, Oral, QHS PRN, Lance Coon, MD .  ezetimibe (ZETIA) tablet 10 mg, 10 mg, Oral, QHS, 10 mg at 10/22/17 2156 **AND** simvastatin (ZOCOR) tablet 40 mg, 40 mg, Oral, QHS, Lance Coon, MD, 40 mg at 10/22/17 2157 .  hyoscyamine (ANASPAZ) disintergrating tablet 0.125 mg, 0.125 mg, Sublingual, Q6H PRN, Lance Coon, MD .  levothyroxine (SYNTHROID, LEVOTHROID) tablet 137 mcg, 137 mcg, Oral, QAC breakfast, Lance Coon, MD, 137 mcg at 10/23/17 0902 .  lidocaine (LIDODERM) 5 % 1 patch, 1 patch, Transdermal, Q24H, Gouru, Aruna, MD, 1 patch at 10/22/17 1138 .  menthol-cetylpyridinium (CEPACOL) lozenge 3 mg, 1 lozenge, Oral, PRN, Gouru, Aruna, MD .  morphine (MS CONTIN) 12 hr tablet 60 mg, 60 mg, Oral, Q12H, Lance Coon, MD, 60 mg at 10/23/17 0901 .  morphine (MSIR) tablet 15 mg, 15 mg, Oral, Q6H PRN, Lance Coon, MD, 15 mg at 10/21/17 2350 .  nystatin (MYCOSTATIN) 100000 UNIT/ML suspension 500,000 Units, 5 mL, Oral, QID, Gouru, Aruna, MD, 500,000 Units at 10/23/17 0901 .  ondansetron (ZOFRAN) tablet 8 mg, 8 mg, Oral, Q8H PRN **OR** ondansetron (ZOFRAN) 8 mg in sodium chloride 0.9 % 50 mL IVPB, 8 mg, Intravenous, Q8H PRN, Lance Coon, MD .  pantoprazole (PROTONIX) EC tablet 40 mg, 40 mg, Oral, Daily, Lance Coon, MD, 40 mg at 10/23/17 0901 .  predniSONE (DELTASONE) tablet 10 mg, 10 mg, Oral, Q breakfast, Gouru, Aruna, MD, 10 mg at 10/23/17 0901 .  promethazine (PHENERGAN) injection 12.5 mg, 12.5 mg, Intravenous, Q6H PRN, Lance Coon, MD .  senna [facility]) tablet 17.2 mg, 2 tablet, Oral, Daily, Lance Coon, MD, 17.2 mg at 10/23/17 0901 .  sertraline (ZOLOFT) tablet 100 mg, 100 mg, Oral, QPC breakfast, Lance Coon, MD, 100 mg at 10/23/17 0901 .  sodium chloride flush (NS) 0.9 % injection 10-40 mL, 10-40  mL, Intracatheter, Q12H, Gouru, Aruna, MD, 10 mL at 10/23/17 0903 .  sodium chloride flush (NS) 0.9 % injection 10-40 mL, 10-40 mL, Intracatheter, PRN, Gouru, Aruna, MD .  sodium chloride tablet 1 g, 1 g, Oral, BID WC, Gouru, Aruna, MD, 1 g at 10/23/17 0902 .  tamsulosin (FLOMAX) capsule 0.8 mg, 0.8 mg, Oral, QPC supper, Lance Coon, MD, 0.8 mg at 10/22/17 1607  Facility-Administered Medications Ordered in Other Encounters:  .  heparin lock flush 100 unit/mL, 500 Units, Intravenous, Once, Earlie Server, MD .  sodium chloride flush (NS) 0.9 % injection 10 mL, 10 mL, Intravenous, PRN, Earlie Server, MD, 10 mL  at 07/03/17 0930   Physical exam:  Vitals:   10/22/17 1340 10/22/17 1400 10/22/17 1958 10/23/17 0458  BP: 126/64  (!) 153/81 (!) 148/68  Pulse: (!) 107 (!) 106 90 (!) 104  Resp: 16  (!) 22 (!) 22  Temp: 98.4 F (36.9 C)  98.3 F (36.8 C) 98 F (36.7 C)  TempSrc: Oral  Oral Oral  SpO2: 100%  97% 93%  Weight:      Height:      GENERAL:Alert, no distress and comfortable.  EYES: +pallor, no icterus OROPHARYNX: no thrush or ulceration; good dentition  NECK: supple, no masses felt LUNGS: clear to auscultation and  No wheeze or crackles HEART/CVS: regular rate & rhythm and no murmurs;  ABDOMEN: abdomen soft, non-tender and normal bowel sounds Musculoskeletal:bilateral lower extremity + 1 edema. No calf pain. PSYCH: alert & oriented x 3  NEURO: chronic lower extremity weakness SKIN:  no rashes or significant lesions      CMP Latest Ref Rng & Units 10/23/2017  Glucose 65 - 99 mg/dL 143(H)  BUN 6 - 20 mg/dL 15  Creatinine 0.61 - 1.24 mg/dL 0.64  Sodium 135 - 145 mmol/L 130(L)  Potassium 3.5 - 5.1 mmol/L 3.9  Chloride 101 - 111 mmol/L 97(L)  CO2 22 - 32 mmol/L 24  Calcium 8.9 - 10.3 mg/dL 7.1(L)  Total Protein 6.5 - 8.1 g/dL -  Total Bilirubin 0.3 - 1.2 mg/dL -  Alkaline Phos 38 - 126 U/L -  AST 15 - 41 U/L -  ALT 17 - 63 U/L -   CBC Latest Ref Rng & Units 10/22/2017  WBC 3.8 -  10.6 K/uL 10.0  Hemoglobin 13.0 - 18.0 g/dL 9.1(L)  Hematocrit 40.0 - 52.0 % 29.3(L)  Platelets 150 - 440 K/uL 114(L)    Ct Abdomen Pelvis W Contrast  Result Date: 10/01/2017 CLINICAL DATA:  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 CONTRAST:  148mL ISOVUE-300 IOPAMIDOL (ISOVUE-300) INJECTION 61% COMPARISON:  07/06/2017 FINDINGS: Lower chest: Lung bases are clear. Hepatobiliary: Multiple new round lesions in the liver. Largest lesion the RIGHT hepatic lobe measures 5.8 x 5.4 cm. Example lesion in the LEFT hepatic lobe measures 2.7 cm. Approximate 8 lesions in the liver. Pancreas: Pancreas is normal. No ductal dilatation. No pancreatic inflammation. Spleen: Normal spleen Adrenals/urinary tract: Adrenal glands normal. Lobular mass involving the RIGHT hilum and renal cortex measures 4.6 x 2.9 cm compared with 4.6 x 3.0 cm. LEFT kidney is normal. Foley catheter within the bladder. Stomach/Bowel: Stomach, small bowel, appendix, and cecum are normal. The colon and rectosigmoid colon are normal. Vascular/Lymphatic: Abdominal aorta is normal. There is poor definite of the IVC at the level of the RIGHT kidney. Possible tumor involvement (image 32 through 29 of series 3) Reproductive: Prostate normal Other: No peritoneal metastasis. Musculoskeletal: Aggressive lesion in the L2 vertebral body. Potentially new sclerotic lesion in T10 vertebral body. IMPRESSION: 1. New bilobed hepatic metastasis. 2. RIGHT renal cortical and hilar mass is similar. 3. Concern for involvement of the IVC and RIGHT renal vein. Similar findings. 4. Skeletal metastasis at L2. Probable new sclerotic metastasis at T 10 . These results will be called to the ordering clinician or representative by the Radiologist Assistant, and  communication documented in the PACS or zVision Dashboard. Electronically Signed   By: Helane Gunther.D.  On: 10/01/2017 15:23    Assessment and plan-  Patient is a 77 y.o. male with metastatic transitional cell carcinoma of kidney presents with generalized weakness  # not sure if patient has acute UTI as his urine is always positive due to chronic indwelling catheter and his urine culture revealed multiple organism, likely due to colonization.   # Generalized weakness is likely due to disease progression.  #  Metastatic transitional cell carcinoma: I spent time discuss with patient's wife and patient again today. Prognosis is very poor. He has ECOG 3-4 performance status and appears to further decline since I saw him outpatient. Discussed multiple times about goal of care and patient has previously hope to go to [facility] for second opinion. Today wife is interested in hospice. Patient himself hesitates make his decision and wants to think it over.  Updated palliative care team. They will follow.   # Transaminitis started after one cycle of immunotherapy and he has been on steroids. LFTs improving. Slowing tapering down 10mg  per week. wil continue him on 10mg  daily this week.   # Hypothyroidism: TSH was 10.6. I increase his synthroid lately and will continue to titrate.  # IVC and renal vein tumor thrombosis: continue Eliquis 5mg  BID.  # Hiccups: not relieved by thorazine. Switch to baclofen (ordered),       Earlie Server, MD, PhD Hematology Oncology [facility] Pager- [phone] 10/23/2017

## 2017-10-23 NOTE — Consult Note (Signed)
Consultation Note Date: 10/23/17  Patient Name: Joseph Ritter  DOB: June 29, 1941  MRN: [id number]  Age / Sex: 77 y.o., male  PCP: [name], MD Referring Physician: Nicholes Mango, MD  Reason for Consultation: Establishing goals of care and Hospice Evaluation  HPI/Patient Profile: 77 y.o. 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 In ED, UA positive for UTI and started on antibiotics. Followed by Dr. 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 Not a candidate for further chemotherapy. Received one cycle of immunotherapy but on hold due to transaminitis. Per oncology, per prognosis and eligible for hospice services. Pending second opinion from [place]. Palliative medicine consultation for goals of care/hospice evaluation.    Clinical Assessment and Goals of Care: I have reviewed medical records, discussed with Dr. Tasia Catchings and Dr. Margaretmary Eddy, and met with patient and wife Joseph Ritter) at bedside to discuss diagnosis, prognosis, [place], EOL wishes, disposition and options.  Patient is awake, alert, oriented, and able to participate in [place] conversation. He c/o hiccups and RLE spasm. Started on baclofen this morning. He takes MS Contin and prn MSIR and states relief from current pain regimen.   Introduced Palliative Medicine as specialized medical care for people living with serious illness. It focuses on providing relief from the symptoms and stress of a serious illness. The goal is to improve quality of life for both the patient and the family.  We discussed a brief life review of the patient. Lives home with wife of 60 years. They do not have children but have support from other family members. Diagnosed with right renal  cancer in 2017. Per wife, it was very high risk for him to have kidney removed, due to metastases. He has not tolerated chemo or immunotherapy. He has become progressively weak at home, requiring assist with ADL's.   Discussed hospital diagnoses, interventions, and underlying metastatic cancer. Wife and patient understand he is too weak for further oncology interventions. They were interested in second opinion from [place] but after further discussion with Dr. Tasia Catchings, wife seems to have a good understanding that they will not have further options due to his weakness and declining functional status. Joseph Ritter also speaks of the challenges with transporting him back and forth to appointments and causing him more pain with transfers.   Advanced directives, concepts specific to code status, artifical feeding and hydration, and rehospitalization were considered and discussed. Living will reviewed in Epic. Patient and wife confirm his wishes against heroic measures at EOL including resuscitation, life support, or feeding tube.   I attempted to elicit values and goals of care important to the patient. Being at home is most important. He tells Joseph Ritter and I he wants to be at home at EOL.   The difference between aggressive medical intervention and comfort care was considered in light of the patient's goals of care. Discussed  transition to comfort focused care with goal of comfort, quality and dignity at EOL. Educated on continued management of any symptoms that he has. They understand eligibility for hospice means 6 months or less prognosis.   Hospice services outpatient were explained and offered. After discussion, patient and wife agree to start hospice services at home on discharge. Discussed hospice philosophy and goal of symptom management and preventing re-hospitalization.   Questions and concerns were addressed. Emotional/spiritual support provided. PMT contact information given.    SUMMARY OF RECOMMENDATIONS      DNR/DNI  Continue current interventions with transition to hospice services at home on discharge.  Agree with current symptom management regimen.    Code Status/Advance Care Planning:  DNR  Symptom Management:   Per attending  Palliative Prophylaxis:   Bowel Regimen, Frequent Pain Assessment, Oral Care and Turn Reposition  Additional Recommendations (Limitations, Scope, Preferences):  DNR/DNI. Continue current interventions. Plan is for hospice services at home on discharge.  Psycho-social/Spiritual:   Desire for further Chaplaincy support: no  Additional Recommendations: Caregiving  Support/Resources and Education on Hospice  Prognosis:   < 6 months if not significantly less with metastatic transitional cell carcinoma of right kidney and functional/nutritional status decline.  Discharge Planning: Home with Hospice      Primary Diagnoses: Present on Admission: . UTI (urinary tract infection) . GERD (gastroesophageal reflux disease) . Adult hypothyroidism . Arteriosclerosis of coronary artery . HTN (hypertension) . HLD (hyperlipidemia) . Transitional cell carcinoma of kidney, right ([place]) . CKD (chronic kidney disease) stage 3, GFR 30-59 ml/min (HCC)   I have reviewed the medical record, interviewed the patient and family, and examined the patient. The following aspects are pertinent.  Past Medical History:  Diagnosis Date  . Acid indigestion 03/09/2014  . Adult hypothyroidism 11/18/2012  . Anxiety   . Arteriosclerosis of coronary artery 11/18/2012   Overview:  PATENT LIMA TO LAD, PATENT SVG TO PDA AND OCCULUDED SVG TO OM2 BY CATHERIZATION 02/09/2009   . 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 Borderline diabetes 05/25/2014  . BP (high blood pressure) 11/18/2012  . BPH (benign prostatic hypertrophy)   . CAFL (chronic airflow limitation) ([place]) 05/03/2015  . Cancer ([place])    renal cancer with mets  . CCF (congestive cardiac  failure) ([place]) 01/03/2014   Overview:  HX OF   . COPD (chronic obstructive pulmonary disease) ([place])   . DDD (degenerative disc disease), lumbar 06/29/2014  . 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 Gastric ulcer 03/09/2014  . 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 History of urinary self-catheterization 2017  . HLD (hyperlipidemia) 01/03/2014  . Incomplete bladder emptying 11/18/2012  . Leg weakness 11/18/2012  . Lumbar radiculitis   . Neuritis or radiculitis due to rupture of lumbar intervertebral disc 06/29/2014  . Overactive bladder   . PONV (postoperative nausea and vomiting)    years ago with Ether, no problem with Nausea or vomiting with the last few surgerys  . Pre-diabetes   . 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 Teeth problem    pt reports "bad teeth", "need to be pulled"   Social History   Socioeconomic History  . Marital status: Married    Spouse name: None  . Number of children: None  . Years of education: None  . Highest education level: None  Social Needs  .  Financial resource strain: None  . Food insecurity - worry: None  . Food insecurity - inability: None  . Transportation needs - medical: None  . Transportation needs - non-medical: None  Occupational History  . None  Tobacco Use  . Smoking status: Former Smoker    Types: Cigarettes    Last attempt to quit: 04/07/2002    Years since quitting: 15.5  . Smokeless tobacco: Former Systems developer    Types: Snuff, Chew  Substance and Sexual Activity  . Alcohol use: No    Alcohol/week: 0.0 oz  . Drug use: No  . Sexual activity: None  Other Topics Concern  . None  Social History Narrative  . None   Family History  Problem Relation Age of Onset  . Heart attack Mother   . Heart disease Father   . Prostate cancer Neg Hx   . Bladder Cancer Neg Hx   . Kidney cancer Neg Hx    Scheduled Meds: . apixaban  5 mg  Oral BID  . baclofen  10 mg Oral TID  . buPROPion  100 mg Oral Daily  . cephALEXin  500 mg Oral Q12H  . ezetimibe  10 mg Oral QHS   And  . simvastatin  40 mg Oral QHS  . levothyroxine  137 mcg Oral QAC breakfast  . lidocaine  1 patch Transdermal Q24H  . morphine  60 mg Oral Q12H  . nystatin  5 mL Oral QID  . pantoprazole  40 mg Oral Daily  . predniSONE  10 mg Oral Q breakfast  . senna  2 tablet Oral Daily  . sertraline  100 mg Oral QPC breakfast  . sodium chloride flush  10-40 mL Intracatheter Q12H  . sodium chloride  1 g Oral BID WC  . tamsulosin  0.8 mg Oral QPC supper   Continuous Infusions: . ondansetron (ZOFRAN) IV     PRN Meds:.acetaminophen **OR** acetaminophen, cyclobenzaprine, hyoscyamine, menthol-cetylpyridinium, morphine, ondansetron **OR** ondansetron (ZOFRAN) IV, promethazine, sodium chloride flush Medications Prior to Admission:  Prior to Admission medications   Medication Sig Start Date End Date Taking? Authorizing Provider  apixaban (ELIQUIS) 5 MG TABS tablet Take 1 tablet (5 mg total) by mouth 2 (two) times daily. 09/26/17  Yes Earlie Server, MD  buPROPion (WELLBUTRIN SR) 100 MG 12 hr tablet TAKE ONE TABLET BY MOUTH EVERY DAY 05/29/17  Yes [provider]  cyclobenzaprine (FLEXERIL) 10 MG tablet Take 10 mg by mouth at bedtime as needed for muscle spasms.   Yes [provider]  ezetimibe-simvastatin (VYTORIN) 10-40 MG tablet Take 1 tablet by mouth at bedtime.   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   Yes [provider]  ketoconazole (NIZORAL) 2 % shampoo APPLY SHAMPOO TO HAIR AS NEEDED FOR FLAKY/ITCHY SCALP (TYPICALLY EVERY 2-3 DAYS) 03/01/15  Yes [provider]  levothyroxine (SYNTHROID, LEVOTHROID) 137 MCG tablet Take 1 tablet (137 mcg total) by mouth daily before breakfast. 10/04/17  Yes Earlie Server, MD  morphine (MS CONTIN) 30 MG 12 hr tablet Take 2 tablets (60 mg total) by mouth every 12 (twelve) hours. 09/26/17  Yes Earlie Server, MD  morphine (MSIR) 15 MG tablet Take 1 tablet (15 mg total) by mouth every 6 (six) hours as needed for moderate pain or severe pain. 09/26/17  Yes Earlie Server, MD  Multiple Vitamin (MULTIVITAMIN WITH MINERALS) TABS  tablet Take 1 tablet by mouth daily after breakfast. CENTRUM SILVER   Yes [provider]  ondansetron (ZOFRAN) 8 MG tablet TAKE 1 TAB BY MOUTH 2 TIMES DAILY AS NEEDED FOR REFRACTORY NAUSEA/VOMITING. START ON DAY 3 AFTER CHEMO 10/01/17  Yes Earlie Server, MD  PEPPERMINT OIL PO Take 1 capsule by mouth 3 (three) times daily as needed (for IBS). IBgard (active ingredient: 90 mg ultrapurified peppermint oil)   Yes [provider]  predniSONE (DELTASONE) 10 MG tablet Take 3 tablets (30 mg total) by mouth daily with breakfast. 10/10/17  Yes Earlie Server, MD  RABEprazole (ACIPHEX) 20 MG tablet Take 20 mg by mouth daily before breakfast.  03/08/15  Yes [provider]  SENNA LAX 8.6 MG tablet TAKE TWO TABLETS BY MOUTH DAILY 07/26/17  Yes Earlie Server, MD  sertraline (ZOLOFT) 100 MG tablet Take 100 mg by mouth daily after breakfast.   Yes [provider]  sodium chloride 1 g tablet Take 1 tablet (1 g total) by mouth 2 (two) times daily with a meal. 10/10/17  Yes Earlie Server, MD  tamsulosin (FLOMAX) 0.4 MG CAPS capsule Take 0.8 mg by mouth daily after supper.  02/04/14  Yes [provider]  feeding supplement, ENSURE ENLIVE, (ENSURE ENLIVE) LIQD Take 237 mLs by mouth 2 (two) times daily between meals. 07/10/17   Loletha Grayer, MD  lidocaine (LIDODERM) 5 % Place 1 patch onto the skin every 12 (twelve) hours. Remove & Discard patch within 12 hours or as directed by MD 06/25/17 06/25/18  Earlie Server, MD  LORazepam (ATIVAN) 0.5 MG tablet Take 1 tablet (0.5 mg total) by mouth every 8 (eight) hours as needed for anxiety (nausea). Patient not taking: Reported  on 09/26/2017 06/01/17   Earlie Server, MD  nystatin (MYCOSTATIN) 100000 UNIT/ML suspension  06/25/17   [provider]  polyethylene glycol powder (GLYCOLAX/MIRALAX) powder Take 17 g by mouth daily after breakfast. Mix with glass of water 05/08/17   Earlie Server, MD  promethazine (PHENERGAN) 25 MG tablet Take 1 tablet (25 mg total) by mouth every 6 (six) hours as needed for nausea or vomiting. Patient not taking: Reported on 09/26/2017 07/10/17   Loletha Grayer, MD  sodium phosphate (FLEET) 7-19 GM/118ML ENEM Place 133 mLs (1 enema total) rectally daily as needed for severe constipation. Patient not taking: Reported on 09/26/2017 07/10/17   Loletha Grayer, MD   No Known Allergies Review of Systems  Constitutional: Positive for activity change, appetite change and fatigue.  HENT:       Hiccups  Cardiovascular: Positive for leg swelling.  Musculoskeletal:       Muscle cramps RLE  Neurological: Positive for weakness.   Physical Exam  Constitutional: He is oriented to person, place, and time. He is cooperative. He appears ill.  HENT:  Head: Normocephalic and atraumatic.  Cardiovascular: Regular rhythm.  Pulmonary/Chest: Effort normal.  Abdominal: There is tenderness.  Neurological: He is alert and oriented to person, place, and time.  Skin: Skin is warm and dry. There is pallor.  Psychiatric: He has a normal mood and affect. His speech is normal and behavior is normal.  Nursing note and vitals reviewed.  Vital Signs: BP (!) 148/68 (BP Location: Left Arm)   Pulse (!) 104   Temp 98 F (36.7 C) (Oral)   Resp (!) 22   Ht _0  (1.676 m)   Wt 61 kg (134 lb 7.7 oz)   SpO2 93%   BMI 21.71 kg/m  Pain Assessment:  No/denies pain POSS *See Group Information*: S-Acceptable,Sleep, easy to arouse Pain Score: Asleep   SpO2: SpO2: 93 % O2 Device:SpO2: 93 % O2 Flow Rate: .   IO: Intake/output summary:   Intake/Output Summary (Last 24 hours) at 10/23/2017 1233 Last data filed at 10/23/2017  2179 Gross per 24 hour  Intake 357 ml  Output 750 ml  Net -393 ml    LBM: Last BM Date: 10/21/17 Baseline Weight: Weight: 61 kg (134 lb 7.7 oz) Most recent weight: Weight: 61 kg (134 lb 7.7 oz)     Palliative Assessment/Data: PPS 40%   Flowsheet Rows     Most Recent Value  Intake Tab  Referral Department  Hospitalist  Unit at Time of Referral  Oncology Unit  Palliative Care Primary Diagnosis  Cancer  Palliative Care Type  New Palliative care  Reason for referral  Clarify Goals of Care, Counsel Regarding Hospice  Date first seen by Palliative Care  10/23/17  Clinical Assessment  Palliative Performance Scale Score  40%  Psychosocial & Spiritual Assessment  Palliative Care Outcomes  Patient/Family meeting held?  Yes  Who was at the meeting?  patient and wife  Palliative Care Outcomes  Clarified goals of care, Provided end of life care assistance, Provided psychosocial or spiritual support, ACP counseling assistance, Improved pain interventions, Improved non-pain symptom therapy, Counseled regarding hospice, Transitioned to hospice      Time In: 5 Time Out: 1230 Time Total: 35mn Greater than 50%  of this time was spent counseling and coordinating care related to the above assessment and plan.  Signed by:  MIhor Dow FNP-C Palliative Medicine Team  Phone: [phone]: [phone]  Please contact Palliative Medicine Team phone at [phone] questions and concerns.  For individual provider: See AShea Evans

## 2017-10-23 NOTE — Progress Notes (Signed)
[facility] [place] NAME: Joseph Ritter    MR#:  [id number]  DATE OF BIRTH:  25-Jul-1941  SUBJECTIVE:  CHIEF COMPLAINT: Patient is weak and tired, patient  hiccups improved, patient and wife are leaning towards hospice care at home   REVIEW OF SYSTEMS:  CONSTITUTIONAL: reporting generalized weakness.  EYES: No blurred or double vision.  EARS, NOSE, AND THROAT: No tinnitus or ear pain.  RESPIRATORY: No cough, shortness of breath, wheezing or hemoptysis.  CARDIOVASCULAR: No chest pain, orthopnea, edema.  GASTROINTESTINAL: No nausea, vomiting, diarrhea or abdominal pain.  GENITOURINARY: No dysuria, hematuria.  ENDOCRINE: No polyuria, nocturia,  HEMATOLOGY: No anemia, easy bruising or bleeding SKIN: No rash or lesion. MUSCULOSKELETAL: No joint pain or arthritis.   NEUROLOGIC: No tingling, numbness, weakness.  PSYCHIATRY: No anxiety or depression.   DRUG ALLERGIES:  No Known Allergies  VITALS:  Blood pressure 122/68, pulse 88, temperature 97.8 F (36.6 C), temperature source Oral, resp. rate 15, height 5\' 6"  (1.676 m), weight 61 kg (134 lb 7.7 oz), SpO2 94 %.  PHYSICAL EXAMINATION:  GENERAL:  77 y.o.-year-old patient lying in the bed with no acute distress.  EYES: Pupils equal, round, reactive to light and accommodation. No scleral icterus. Extraocular muscles intact.  HEENT: Head atraumatic, normocephalic. Oropharynx and nasopharynx clear.  NECK:  Supple, no jugular venous distention. No thyroid enlargement, no tenderness.  LUNGS: Normal breath sounds bilaterally, no wheezing, rales,rhonchi or crepitation. No use of accessory muscles of respiration.  CARDIOVASCULAR: S1, S2 normal. No murmurs, rubs, or gallops.  Anterior chest wall with Port-A-Cath ABDOMEN: Soft, nontender, nondistended. Bowel sounds present.  EXTREMITIES: No pedal edema, cyanosis, or clubbing.  NEUROLOGIC: Cranial nerves II through XII are intact. Muscle  strength generalized weakness in all extremities. Sensation intact. Gait not checked.  PSYCHIATRIC: The patient is alert and oriented x 3.  SKIN: No obvious rash, lesion, or ulcer.    LABORATORY PANEL:   CBC Recent Labs  Lab 10/22/17 0401  WBC 10.0  HGB 9.1*  HCT 29.3*  PLT 114*   ------------------------------------------------------------------------------------------------------------------  Chemistries  Recent Labs  Lab 10/20/17 1821  10/23/17 0543  NA 128*   < > 130*  K 4.4   < > 3.9  CL 92*   < > 97*  CO2 26   < > 24  GLUCOSE 252*   < > 143*  BUN 10   < > 15  CREATININE 0.56*   < > 0.64  CALCIUM 7.9*   < > 7.1*  AST 38  --   --   ALT 82*  --   --   ALKPHOS 341*  --   --   BILITOT 0.4  --   --    < > = values in this interval not displayed.   ------------------------------------------------------------------------------------------------------------------  Cardiac Enzymes Recent Labs  Lab 10/22/17 0401  TROPONINI 0.04*   ------------------------------------------------------------------------------------------------------------------  RADIOLOGY:  No results found.  EKG:   Orders placed or performed in visit on 09/10/17  . EKG 12-Lead  . EKG 12-Lead  . EKG 12-Lead    ASSESSMENT AND PLAN:   UTI (urinary tract infection) - Urine culture has revealed multiple organisms which could be from colonization as patient has chronic indwelling Foley catheter Discontinued IV Rocephin.  Changed Foley catheter with a new one the old one was placed on January 9   Metastatic transitional cell carcinoma of kidney, right ([place]) -status post chemotherapy and radiation therapy  Extremely  poor prognosis according to oncology  patient and wife expressed interest, towards hospice care.  Patient was seen by palliative care and referred to hospice  Transaminitis after 1 cycle of chemotherapy patient is on steroids.  Steroid tapering by oncology.  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  Continue sodium chloride tablets  Hiccups-Thorazine as needed does not seem to help much.  Patient was given baclofen and symptoms improved     Arteriosclerosis of coronary artery -continue home meds,  continue Zetia  troponin was mildly elevated this is demand ischemia, troponins are non-trending     HTN (hypertension) -continue home medications      CKD (chronic kidney disease) stage 3, GFR 30-59 ml/min (HCC) -avoid nephrotoxins, monitor    GERD (gastroesophageal reflux disease) - PPI    HLD (hyperlipidemia) -continue Zocor and Zetia    Adult hypothyroidism -home dose thyroid replacement    Physical therapy consult for generalized weakness-recommending skilled nursing facility Follow-up with social worker  All the records are reviewed and case discussed with Care Management/Social Workerr. Management plans discussed with the patient, family and they are in agreement.  CODE STATUS: dnr , planning to discharge patient tomorrow with hospice care at home  [place]: 32  minutes.   POSSIBLE D/C IN 1 DAYS, DEPENDING ON CLINICAL CONDITION.  Note: This dictation was prepared with Dragon dictation along with smaller phrase technology. Any transcriptional errors that result from this process are unintentional.   Nicholes Mango M.D on 10/23/2017 at 8:27 PM  Between 7am to 6pm - Pager - [phone] After 6pm go to [email or web address] - password [email or web address] [facility]  Office  [phone]  CC: Primary care physician; [name], MD

## 2017-10-23 NOTE — Progress Notes (Signed)
Current out patient Palliative patient now transitioning to hospice services. Writer met in the room with Patient and his wife Barbara to initiate education regarding hospice services, philosophy and team approach to care with good understanding voiced. Questions answered. Patient was alert, but very quiet through out the visit. Hospice information and contact number given to Barbara. Plan is for discharge home tomorrow, patient will require EMS, CMRN Brenda Holland aware. Signed DNR to accompany patient at discharge. Updated notes faxed to referral. °Karen Robertson RNb BSN, CHPN °[facility], hospital Liaison °[phone] °

## 2017-10-23 NOTE — Progress Notes (Signed)
Palliative NP met with patient and wife. After [place] discussion, patient and wife request hospice services at home on discharge. RN CM notified. Full note to follow.   NO CHARGE  Ihor Dow, FNP-C Palliative Medicine Team  Phone: [phone] Fax: [phone]

## 2017-10-24 DIAGNOSIS — Z515 Encounter for palliative care: Secondary | ICD-10-CM

## 2017-10-24 LAB — BASIC METABOLIC PANEL
ANION GAP: 9 (ref 5–15)
BUN: 18 mg/dL (ref 6–20)
CALCIUM: 7.3 mg/dL — AB (ref 8.9–10.3)
CO2: 25 mmol/L (ref 22–32)
Chloride: 99 mmol/L — ABNORMAL LOW (ref 101–111)
Creatinine, Ser: 0.69 mg/dL (ref 0.61–1.24)
Glucose, Bld: 150 mg/dL — ABNORMAL HIGH (ref 65–99)
Potassium: 4 mmol/L (ref 3.5–5.1)
SODIUM: 133 mmol/L — AB (ref 135–145)

## 2017-10-24 MED ORDER — ACETAMINOPHEN 325 MG PO TABS: 650.0000 mg | ORAL_TABLET | Freq: Four times a day (QID) | ORAL | Status: AC | PRN

## 2017-10-24 MED ORDER — BACLOFEN 10 MG PO TABS: 10.0000 mg | ORAL_TABLET | Freq: Three times a day (TID) | ORAL | 0 refills | Status: AC

## 2017-10-24 MED ORDER — POLYETHYLENE GLYCOL 3350 17 G PO PACK
17.0000 g | PACK | Freq: Every day | ORAL | Status: DC
  Administered 2017-10-24: 17 g via ORAL
  Filled 2017-10-24: qty 1

## 2017-10-24 MED ORDER — MORPHINE SULFATE 15 MG PO TABS: 15.0000 mg | ORAL_TABLET | Freq: Four times a day (QID) | ORAL | 0 refills | Status: AC | PRN

## 2017-10-24 MED ORDER — PREDNISONE 10 MG PO TABS: 10.0000 mg | ORAL_TABLET | Freq: Every day | ORAL | 0 refills | Status: AC

## 2017-10-24 MED ORDER — MORPHINE SULFATE ER 30 MG PO TBCR: 60.0000 mg | EXTENDED_RELEASE_TABLET | Freq: Two times a day (BID) | ORAL | 0 refills | Status: AC

## 2017-10-24 NOTE — Discharge Summary (Signed)
[facility] NAME: Joseph Ritter    MR#:  [id number]  DATE OF BIRTH:  05/02/1941  DATE OF ADMISSION:  10/20/2017 ADMITTING PHYSICIAN: Lance Coon, MD  DATE OF DISCHARGE:  10/24/17  PRIMARY CARE PHYSICIAN: [name], MD    ADMISSION DIAGNOSIS:  Lower urinary tract infectious disease [N39.0] Hyponatremia [E87.1] Weakness [R53.1]  DISCHARGE DIAGNOSIS:  Principal Problem:   UTI (urinary tract infection) Active Problems:   Arteriosclerosis of coronary artery   GERD (gastroesophageal reflux disease)   HLD (hyperlipidemia)   HTN (hypertension)   Adult hypothyroidism   Transitional cell carcinoma of kidney, right (HCC)   CKD (chronic kidney disease) stage 3, GFR 30-59 ml/min ([place])   Encounter for hospice care discussion   Weakness   Hiccups   Palliative care by specialist   SECONDARY DIAGNOSIS:   Past Medical History:  Diagnosis Date  . Acid indigestion 03/09/2014  . Adult hypothyroidism 11/18/2012  . Anxiety   . Arteriosclerosis of coronary artery 11/18/2012   Overview:  PATENT LIMA TO LAD, PATENT SVG TO PDA AND OCCULUDED SVG TO OM2 BY CATHERIZATION 02/09/2009   . 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 Borderline diabetes 05/25/2014  . BP (high blood pressure) 11/18/2012  . BPH (benign prostatic hypertrophy)   . CAFL (chronic airflow limitation) ([place]) 05/03/2015  . Cancer ([place])    renal cancer with mets  . CCF (congestive cardiac failure) ([place]) 01/03/2014   Overview:  HX OF   . COPD (chronic obstructive pulmonary disease) ([place])   . DDD (degenerative disc disease), lumbar 06/29/2014  . 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 Gastric ulcer 03/09/2014  . 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 History of urinary self-catheterization 2017  . HLD (hyperlipidemia) 01/03/2014  .  Incomplete bladder emptying 11/18/2012  . Leg weakness 11/18/2012  . Lumbar radiculitis   . Neuritis or radiculitis due to rupture of lumbar intervertebral disc 06/29/2014  . Overactive bladder   . PONV (postoperative nausea and vomiting)    years ago with Ether, no problem with Nausea or vomiting with the last few surgerys  . Pre-diabetes   . 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 Teeth problem    pt reports "bad teeth", "need to be pulled"    HOSPITAL COURSE:  hpi Joseph Ritter  is a 77 y.o. male who presents with increasing weakness and lower abdominal pain.  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  He is still seeking treatment for this and is currently in the process of seeking a second opinion.  Due to cancer and his progressive debilitation he has had an indwelling Foley catheter for the past several months.  He has been exchanging this once a month with urology.  Over the past week or so his weakness has progressed to the point that he is no longer able to stand up and walk around his house.  Here in the ED tonight he is found to have bandemia, and UA very suspicious for urinary infection.  He has had a number of urinary infections in his recent history.  Hospitalists were called for admission.  UTI (urinary tract infection) - Urine culture has revealed multiple organisms which could be from colonization as patient has  chronic indwelling Foley catheter Discontinued IV Rocephin.  Changed Foley catheter with a new one the old one was placed on January 9   Metastatic transitional cell carcinoma of kidney, right ([place]) -status post chemotherapy and radiation therapy  Extremely poor prognosis according to oncology  patient and wife expressed interest, towards hospice care.  Patient was seen by palliative care and referred to hospice.  Discharging home with hospice  Transaminitis after 1  cycle of chemotherapy patient is on steroids.  Steroid tapering by oncology.  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  Continue sodium chloride tablets  Hiccups-Thorazine as needed does not seem to help much.  Patient was given baclofen and symptoms improved   Arteriosclerosis of coronary artery -continue home meds, continue Zetia  troponin was mildly elevated this is demand ischemia, troponins are non-trending  HTN (hypertension) -continue home medications  CKD (chronic kidney disease) stage 3, GFR 30-59 ml/min (HCC) -avoid nephrotoxins, monitor  GERD (gastroesophageal reflux disease) - PPI  HLD (hyperlipidemia) -continue Zocor and Zetia  Adult hypothyroidism -home dose thyroid replacement      DISCHARGE CONDITIONS:   GUARDED  CONSULTS OBTAINED:  Treatment Team:  Earlie Server, MD   PROCEDURES replaced Foley catheter with a new one  DRUG ALLERGIES:  No Known Allergies  DISCHARGE MEDICATIONS:   Allergies as of 10/24/2017   No Known Allergies     Medication List    STOP taking these medications   LORazepam 0.5 MG tablet Commonly known as:  ATIVAN   multivitamin with minerals Tabs tablet   promethazine 25 MG tablet Commonly known as:  PHENERGAN   sodium phosphate 7-19 GM/118ML Enem     TAKE these medications   acetaminophen 325 MG tablet Commonly known as:  TYLENOL Take 2 tablets (650 mg total) by mouth every 6 (six) hours as needed for mild pain (or Fever >/= 101).   apixaban 5 MG Tabs tablet Commonly known as:  ELIQUIS Take 1 tablet (5 mg total) by mouth 2 (two) times daily.   baclofen 10 MG tablet Commonly known as:  LIORESAL Take 1 tablet (10 mg total) by mouth 3 (three) times daily.   buPROPion 100 MG 12 hr tablet Commonly known as:  WELLBUTRIN SR TAKE ONE TABLET BY MOUTH EVERY DAY   cyclobenzaprine 10 MG tablet Commonly known as:   FLEXERIL Take 10 mg by mouth at bedtime as needed for muscle spasms.   ezetimibe-simvastatin 10-40 MG tablet Commonly known as:  VYTORIN Take 1 tablet by mouth at bedtime.   feeding supplement (ENSURE ENLIVE) Liqd Take 237 mLs by mouth 2 (two) times daily between meals.   hyoscyamine 0.125 MG Tbdp disintergrating tablet Commonly known as:  ANASPAZ Place 0.125 mg under the tongue every 6 (six) hours as needed (for abdominal cramping due to IBS).   ketoconazole 2 % shampoo Commonly known as:  NIZORAL APPLY SHAMPOO TO HAIR AS NEEDED FOR FLAKY/ITCHY SCALP (TYPICALLY EVERY 2-3 DAYS)   levothyroxine 137 MCG tablet Commonly known as:  SYNTHROID, LEVOTHROID Take 1 tablet (137 mcg total) by mouth daily before breakfast.   lidocaine 5 % Commonly known as:  LIDODERM Place 1 patch onto the skin every 12 (twelve) hours. Remove & Discard patch within 12 hours or as directed by MD   morphine 30 MG 12 hr tablet Commonly known as:  MS CONTIN Take 2 tablets (60 mg total) by mouth every 12 (twelve) hours.  morphine 15 MG tablet Commonly known as:  MSIR Take 1 tablet (15 mg total) by mouth every 6 (six) hours as needed for moderate pain or severe pain.   nystatin 100000 UNIT/ML suspension Commonly known as:  MYCOSTATIN   ondansetron 8 MG tablet Commonly known as:  ZOFRAN TAKE 1 TAB BY MOUTH 2 TIMES DAILY AS NEEDED FOR REFRACTORY NAUSEA/VOMITING. START ON DAY 3 AFTER CHEMO   PEPPERMINT OIL PO Take 1 capsule by mouth 3 (three) times daily as needed (for IBS). IBgard (active ingredient: 90 mg ultrapurified peppermint oil)   polyethylene glycol powder powder Commonly known as:  GLYCOLAX/MIRALAX Take 17 g by mouth daily after breakfast. Mix with glass of water   predniSONE 10 MG tablet Commonly known as:  DELTASONE Take 1 tablet (10 mg total) by mouth daily with breakfast for 7 days. Start taking on:  10/25/2017 What changed:  how much to take   RABEprazole 20 MG tablet Commonly known  as:  ACIPHEX Take 20 mg by mouth daily before breakfast.   SENNA LAX 8.6 MG tablet Generic drug:  senna TAKE TWO TABLETS BY MOUTH DAILY   sertraline 100 MG tablet Commonly known as:  ZOLOFT Take 100 mg by mouth daily after breakfast.   sodium chloride 1 g tablet Take 1 tablet (1 g total) by mouth 2 (two) times daily with a meal.   tamsulosin 0.4 MG Caps capsule Commonly known as:  FLOMAX Take 0.8 mg by mouth daily after supper.        DISCHARGE INSTRUCTIONS:   Follow-up with hospice care physician Continue hospice care at home   DIET:  Regular diet  DISCHARGE CONDITION:  Fair  ACTIVITY:  Activity as tolerated  OXYGEN:  Home Oxygen: No.   Oxygen Delivery: room air  DISCHARGE LOCATION:  home   If you experience worsening of your admission symptoms, develop shortness of breath, life threatening emergency, suicidal or homicidal thoughts you must seek medical attention immediately by calling 911 or calling your MD immediately  if symptoms less severe.  You Must read complete instructions/literature along with all the possible adverse reactions/side effects for all the Medicines you take and that have been prescribed to you. Take any new Medicines after you have completely understood and accpet all the possible adverse reactions/side effects.   Please note  You were cared for by a hospitalist during your hospital stay. If you have any questions about your discharge medications or the care you received while you were in the hospital after you are discharged, you can call the unit and asked to speak with the hospitalist on call if the hospitalist that took care of you is not available. Once you are discharged, your primary care physician will handle any further medical issues. Please note that NO REFILLS for any discharge medications will be authorized once you are discharged, as it is imperative that you return to your primary care physician (or establish a relationship  with a primary care physician if you do not have one) for your aftercare needs so that they can reassess your need for medications and monitor your lab values.     Today  Chief Complaint  Patient presents with  . Weakness   Patient denies any complaints.  Wants to go home with hospice care.  Wife at bedside  ROS:  CONSTITUTIONAL: fatigue, weakness.  EYES: Denies blurry vision, double vision, eye pain. EARS, NOSE, THROAT: Denies tinnitus, ear pain, hearing loss. RESPIRATORY: Denies cough, wheeze, shortness of breath.  CARDIOVASCULAR: Denies chest pain, palpitations, edema.  GASTROINTESTINAL: Denies nausea, vomiting, diarrhea, abdominal pain. Denies bright red blood per rectum. GENITOURINARY: Denies dysuria, hematuria.  Chronic Foley catheter ENDOCRINE: Denies nocturia or thyroid problems. HEMATOLOGIC AND LYMPHATIC: Denies easy bruising or bleeding. PSYCHIATRIC: Denies anxiety or depressive symptoms.   VITAL SIGNS:  Blood pressure (!) 154/66, pulse (!) 105, temperature 98.1 F (36.7 C), temperature source Oral, resp. rate 15, height 5\' 6"  (1.676 m), weight 61 kg (134 lb 7.7 oz), SpO2 95 %.  I/O:    Intake/Output Summary (Last 24 hours) at 10/24/2017 1138 Last data filed at 10/24/2017 1100 Gross per 24 hour  Intake 10 ml  Output 1050 ml  Net -1040 ml    PHYSICAL EXAMINATION:  GENERAL:  77 y.o.-year-old patient lying in the bed with no acute distress.  EYES: Pupils equal, round, reactive to light and accommodation. No scleral icterus. Extraocular muscles intact.  HEENT: Head atraumatic, normocephalic. Oropharynx and nasopharynx clear.  NECK:  Supple, no jugular venous distention. No thyroid enlargement, no tenderness.  LUNGS: Normal breath sounds bilaterally, no wheezing, rales,rhonchi or crepitation. No use of accessory muscles of respiration.  CARDIOVASCULAR: S1, S2 normal. No murmurs, rubs, or gallops.  ABDOMEN: Soft, non-tender, non-distended. Bowel sounds present.   EXTREMITIES: No pedal edema, cyanosis, or clubbing.  NEUROLOGIC: Cranial nerves II through XII are intact. Muscle strength diffusely weak in all extremities. Sensation intact. Gait not checked.  PSYCHIATRIC: The patient is alert and oriented x 3.  SKIN: No obvious rash, lesion, or ulcer.   DATA REVIEW:   CBC Recent Labs  Lab 10/22/17 0401  WBC 10.0  HGB 9.1*  HCT 29.3*  PLT 114*    Chemistries  Recent Labs  Lab 10/20/17 1821  10/24/17 0410  NA 128*   < > 133*  K 4.4   < > 4.0  CL 92*   < > 99*  CO2 26   < > 25  GLUCOSE 252*   < > 150*  BUN 10   < > 18  CREATININE 0.56*   < > 0.69  CALCIUM 7.9*   < > 7.3*  AST 38  --   --   ALT 82*  --   --   ALKPHOS 341*  --   --   BILITOT 0.4  --   --    < > = values in this interval not displayed.    Cardiac Enzymes Recent Labs  Lab 10/22/17 0401  TROPONINI 0.04*    Microbiology Results  Results for orders placed or performed during the hospital encounter of 10/20/17  Urine Culture     Status: Abnormal   Collection Time: 10/20/17  6:21 PM  Result Value Ref Range Status   Specimen Description   Final    URINE, RANDOM Performed at [facility], [address] [place], [place] [address]    Special Requests   Final    NONE Performed at [facility], [address] [place], [place] [address]    Culture MULTIPLE SPECIES PRESENT, SUGGEST RECOLLECTION (A)  Final   Report Status 10/22/2017 FINAL  Final    RADIOLOGY:  No results found.  EKG:   Orders placed or performed in visit on 09/10/17  . EKG 12-Lead  . EKG 12-Lead  . EKG 12-Lead      Management plans discussed with the patient, family and they are in agreement.  CODE STATUS:     Code Status Orders  (From admission, onward)  Start     Ordered   10/20/17 2311  Do not attempt resuscitation (DNR)  Continuous    Question Answer Comment  In the event of cardiac or respiratory ARREST Do not call a "code blue"   In the event  of cardiac or respiratory ARREST Do not perform Intubation, CPR, defibrillation or ACLS   In the event of cardiac or respiratory ARREST Use medication by any route, position, wound care, and other measures to relive pain and suffering. May use oxygen, suction and manual treatment of airway obstruction as needed for comfort.   Comments nurse may pronounce      10/20/17 2310    Code Status History    Date Active Date Inactive Code Status Order ID Comments User Context   09/11/2017 00:53 09/12/2017 18:38 DNR [id number]  Lance Coon, MD Inpatient   07/07/2017 13:44 07/10/2017 21:32 DNR [id number]  Loletha Grayer, MD Inpatient   07/07/2017 01:41 07/07/2017 13:44 Full Code [id number]  Harvie Bridge, DO Inpatient   05/02/2017 17:04 05/04/2017 18:51 Full Code [id number]  [name], MD Inpatient   04/25/2017 08:51 04/25/2017 14:36 Full Code [id number]  Isaias Cowman, MD Inpatient    Advance Directive Documentation     Most Recent Value  Type of Advance Directive  Out of facility DNR (pink MOST or yellow form)  Pre-existing out of facility DNR order (yellow form or pink MOST form)  Yellow form placed in chart (order not valid for inpatient use)  "MOST" Form in Place?  No data      TOTAL TIME TAKING CARE OF THIS PATIENT: 43  minutes.   Note: This dictation was prepared with Dragon dictation along with smaller phrase technology. Any transcriptional errors that result from this process are unintentional.   @[facility] @  on 10/24/2017 at 11:38 AM  Between 7am to 6pm - Pager - [phone]  After 6pm go to [email or web address] - password [email or web address] [facility]  Office  [phone]  CC: Primary care physician; [name], MD

## 2017-10-24 NOTE — Care Management (Signed)
Discharge to home today per Dr. Margaretmary Eddy. Will be followed by [facility]. Transportation  will be arranged per [place] RN MSN CCM Care Management [phone]

## 2017-10-24 NOTE — Progress Notes (Signed)
Follow up visit made. Patient being prepared for discharge home. Plan is for discharge via EMS. Signed DNR in place and will accompany patient home. Mrs. Ekdahl requested to contact hospice, number given and reinforced, when patient arrives home. Will forward discharge summary when available. Flo Shanks RN, BSN, [facility], hospital  Liaison [phone]

## 2017-10-24 NOTE — Discharge Instructions (Signed)
Follow-up with hospice care physician Continue hospice care at home

## 2017-10-29 ENCOUNTER — Inpatient Hospital Stay: Payer: Medicare Other | Attending: Oncology

## 2017-10-31 ENCOUNTER — Telehealth: Payer: Self-pay | Admitting: *Deleted

## 2017-11-01 ENCOUNTER — Ambulatory Visit: Payer: Medicare Other

## 2017-11-23 NOTE — Telephone Encounter (Signed)
Ey with hospice called to report that patient expired at 1:12 PM today at his home

## 2017-11-23 DEATH — deceased

## 2017-11-26 ENCOUNTER — Ambulatory Visit: Payer: Medicare Other

## 2017-12-06 ENCOUNTER — Other Ambulatory Visit: Payer: Self-pay | Admitting: Nurse Practitioner

## 2018-05-16 ENCOUNTER — Ambulatory Visit: Payer: Medicare Other

## 2018-09-16 IMAGING — CT CT ABD-PEL WO/W CM
1 of 4 series · 8 of 32 positions shown, 13 images · IV contrast (APPLIED)
Comparison: MRI of 07/07/2016. Ultrasound 06/21/2016. CT of
10/11/2010.

CLINICAL DATA: Gross hematuria for the past 2 weeks. Renal mass on
ultrasound. Followup of MRI. Neurogenic bladder with[REDACTED]reased
contractility. Urinary urgency.

EXAM:
CT ABDOMEN AND PELVIS WITHOUT AND WITH CONTRAST
TECHNIQUE: Multidetector CT imaging of the abdomen and pelvis was performed
following the standard protocol before and following the bolus
administration of intravenous contrast.
CONTRAST:  125mL SF8PSS-MJJ IOPAMIDOL (SF8PSS-MJJ) INJECTION 61%

[Series 13: axial delay · axial · delayed · 0.79mm/px · z∈[-1038,-664]mm · 8 of 97 slices shown, 13 images]
[im 11/97  soft-tissue]
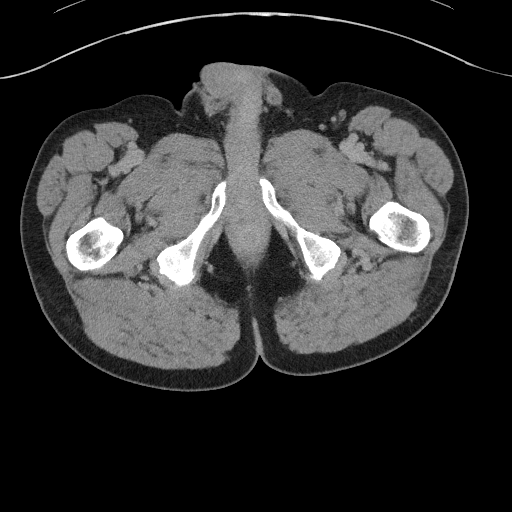
[im 11/97  bone]
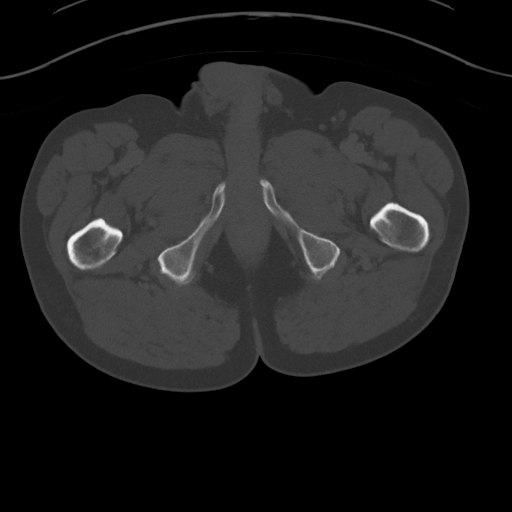
[im 22/97  soft-tissue]
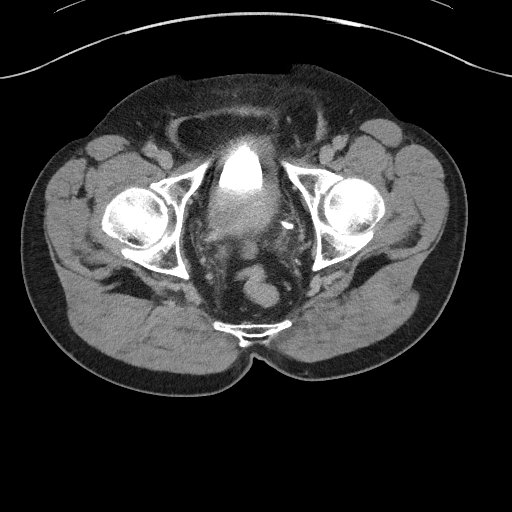
[im 33/97  soft-tissue]
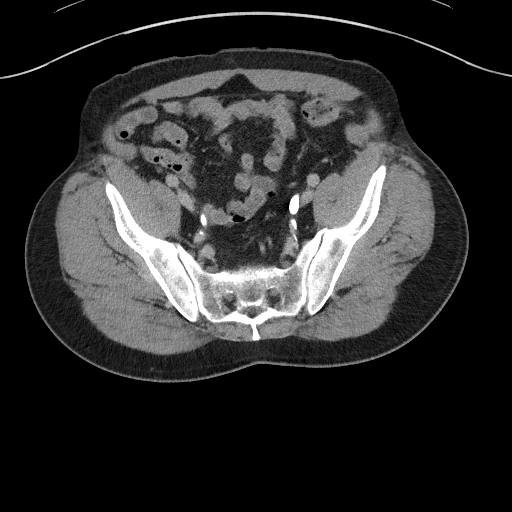
[im 43/97  soft-tissue]
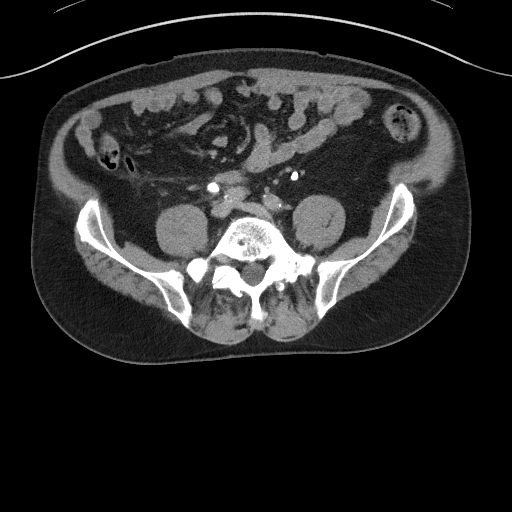
[im 54/97  soft-tissue]
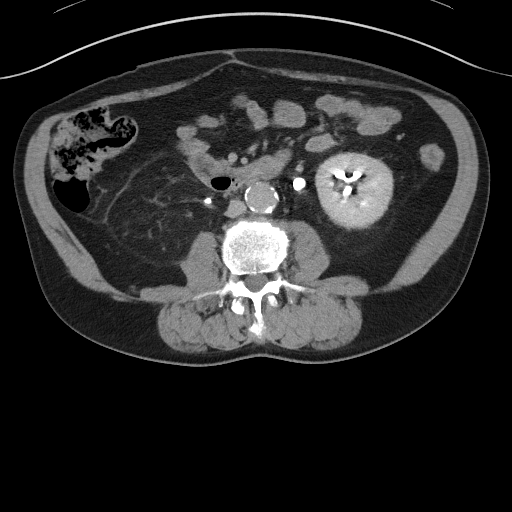
[im 54/97  lung]
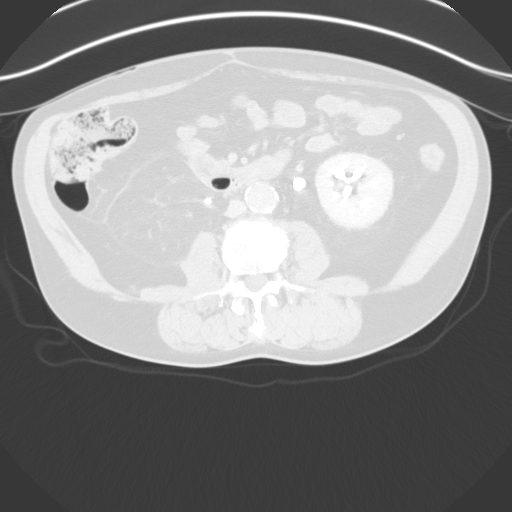
[im 65/97  soft-tissue]
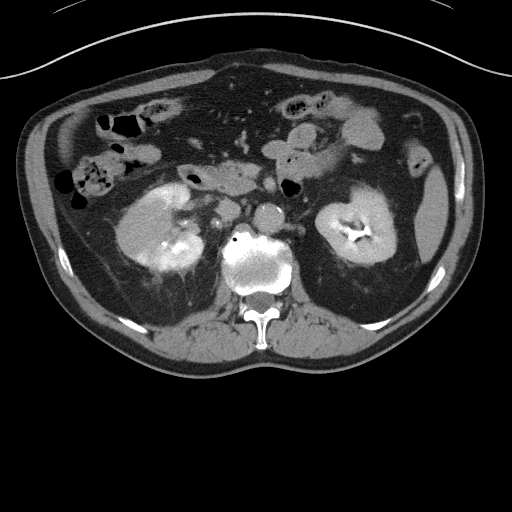
[im 65/97  lung]
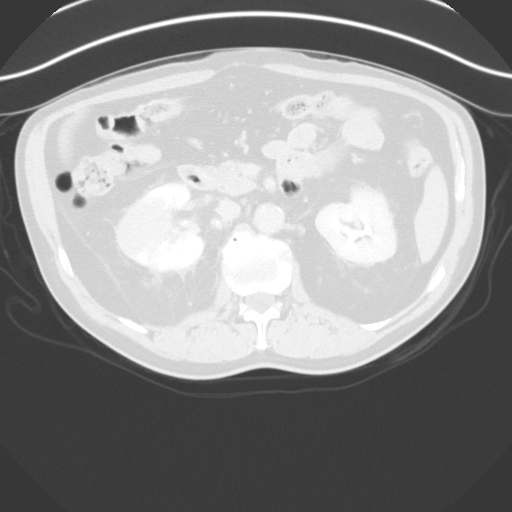
[im 75/97  soft-tissue]
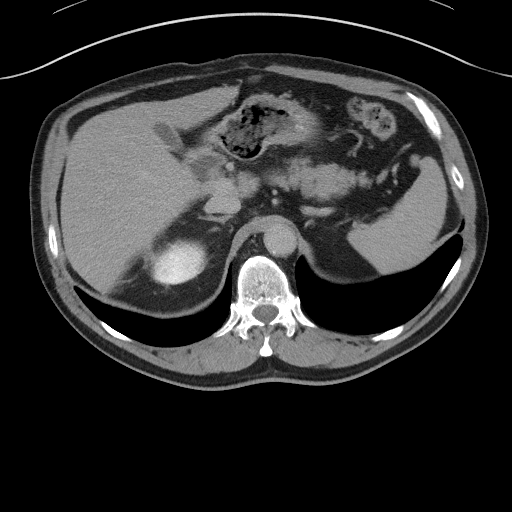
[im 75/97  lung]
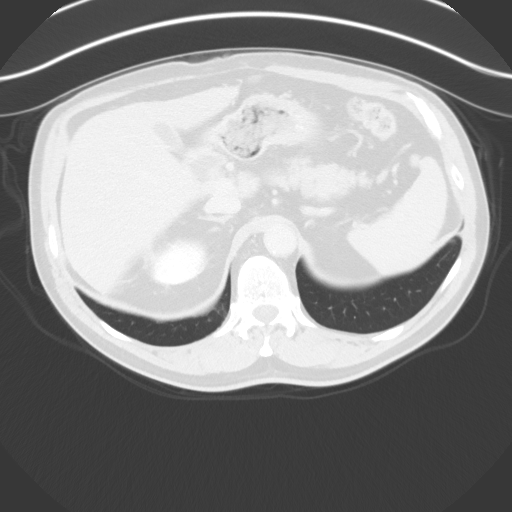
[im 86/97  soft-tissue]
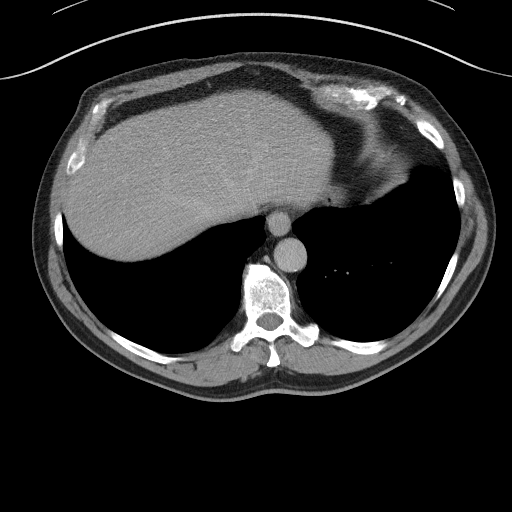
[im 86/97  lung]
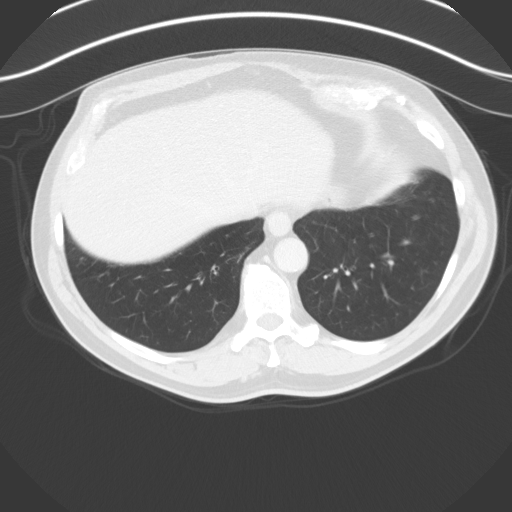

[8 of 32 positions shown; findings below may reference images not displayed]

FINDINGS: Lower chest: Right base scarring laterally. Normal heart size
without pericardial or pleural effusion. Tiny hiatal hernia.

Hepatobiliary: Mild hepatic steatosis, without focal liver lesion.
Normal gallbladder, without biliary ductal dilatation.

Pancreas: Normal, without mass or ductal dilatation.

Spleen: Normal in size, without focal abnormality.

Adrenals/Urinary Tract: Normal adrenal glands. Probable left renal
vascular calcification. No renal calculi or hydronephrosis. No
hydroureter or ureteric calculi. No bladder calculi. Left
paracentral prostatic calcification is favored to be outside the
urethra.

Too small to characterize 2 mm interpolar left renal lesion.
Heterogeneous enhancement involving the interpolar right kidney is
similar to slightly increased compared to 07/07/2016 (given cross
modality comparison). This is most apparent on delayed images, and
involves the lower pole right kidney as well. Example series 13. The
right ureter demonstrates thickening and mucosal hyper enhancement
throughout. Mild upper pole right-sided caliectasis. Good renal
collecting system opacification on delayed images. Good left and
moderate right ureteric opacification, without filling defect. The
urinary bladder is markedly thick walled with mucosal
hyperenhancement. Example image 69/5. No dominant mass identified.
Not well evaluated on delayed images secondary to suboptimal
distention.

Stomach/Bowel: Normal stomach, without wall thickening. Extensive
colonic diverticulosis. Normal terminal ileum and appendix. Normal
small bowel.

Vascular/Lymphatic: Advanced aortic and branch vessel
atherosclerosis. Retroaortic left renal vein. Patent right renal
vein. Non aneurysmal infrarenal aortic dilatation at 2.6 cm. No
abdominopelvic adenopathy.

Reproductive: Mild prostatomegaly with hyper enhancement involving
the left side of the prostate on image 74/series 5.

Other: No significant free fluid. Fat containing moderate-size left
inguinal hernia.

Musculoskeletal: No acute osseous abnormality. Degenerative disc
disease including at L4-5 and L5-S1.
IMPRESSION: 1. Since the MRI of 07/07/2016, similar to increased right inter and
lower pole renal heterogeneous hypo enhancement. Given the
appearance of the right ureter and bladder, strongly favored to
represent pyelonephritis. To exclude highly unlikely infiltrative
neoplasm, consider follow-up pre and post contrast CT or MRI in 6-8
weeks, after appropriate antibiotic therapy.
2. Probable cystitis and ascending infection involving the right
ureter. Heterogeneous enhancement involving the left side of the
prostate may represent prostatitis but is nonspecific.
3.  Tiny hiatal hernia.
4.  Aortic atherosclerosis.

## 2019-03-19 IMAGING — CT CT ABD-PEL WO/W CM
3 of 10 series · 12 of 46 positions shown, 18 images · IV contrast (iopamidol)
Comparison: CT on 08/09/2016

CLINICAL DATA: Right lower quadrant pain for 3 weeks. Followup
indeterminate right renal mass lesion.

EXAM:
CT ABDOMEN AND PELVIS WITHOUT AND WITH CONTRAST
TECHNIQUE: Multidetector CT imaging of the abdomen and pelvis was performed
following the standard protocol before and following the bolus
administration of intravenous contrast.
CONTRAST:  125mL K1OECI-G77 IOPAMIDOL (K1OECI-G77) INJECTION 61%

[Series 3: renal arterial · axial · arterial · 0.75mm/px · z∈[-864,-513]mm · 8 of 151 slices shown, 13 images]
[im 17/151  soft-tissue]
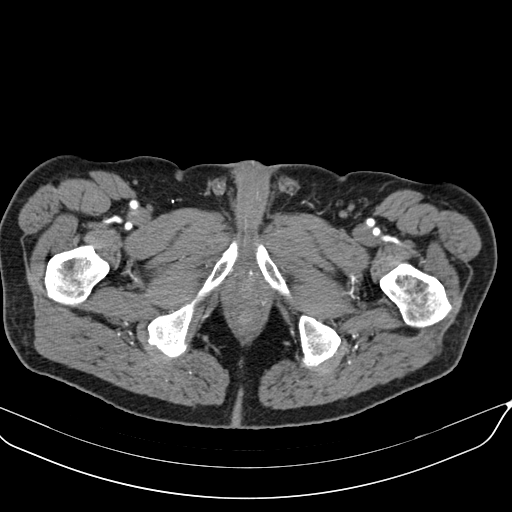
[im 17/151  bone]
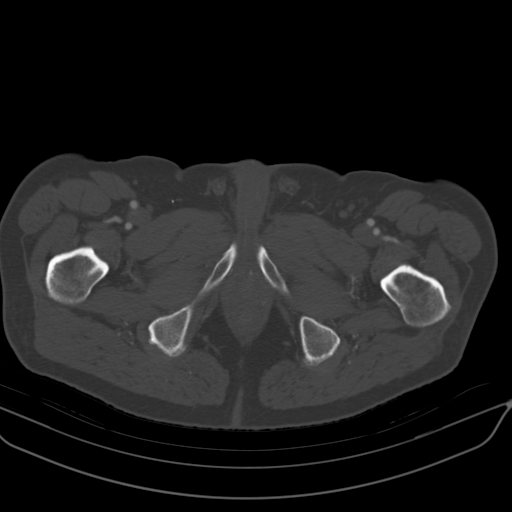
[im 34/151  soft-tissue]
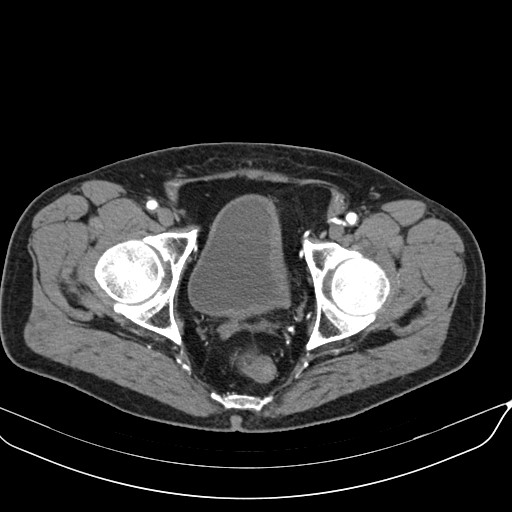
[im 51/151  soft-tissue]
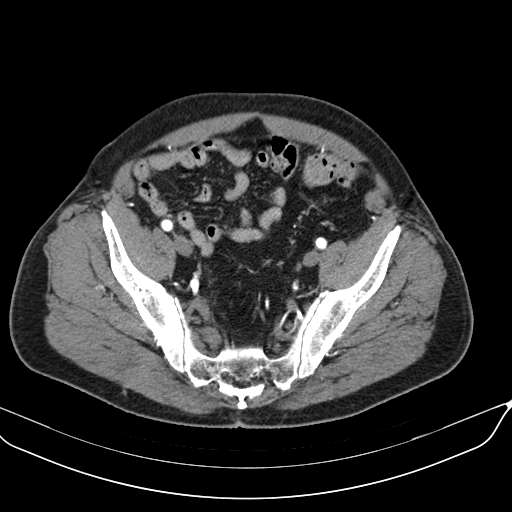
[im 67/151  soft-tissue]
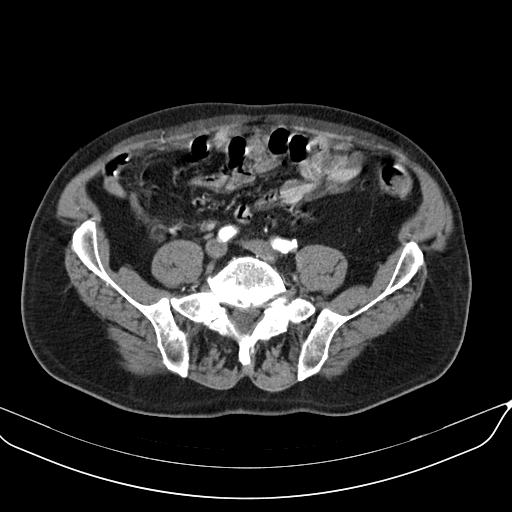
[im 84/151  soft-tissue]
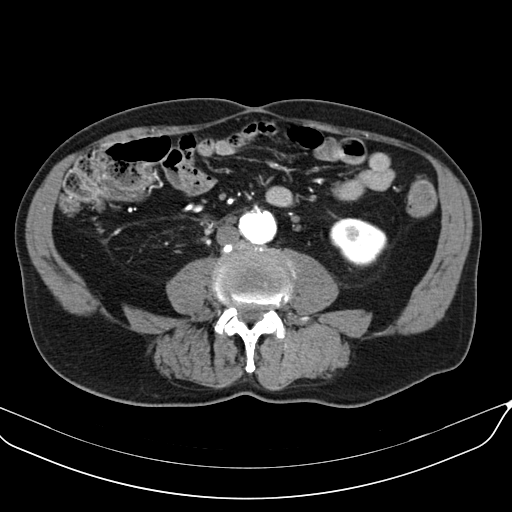
[im 84/151  lung]
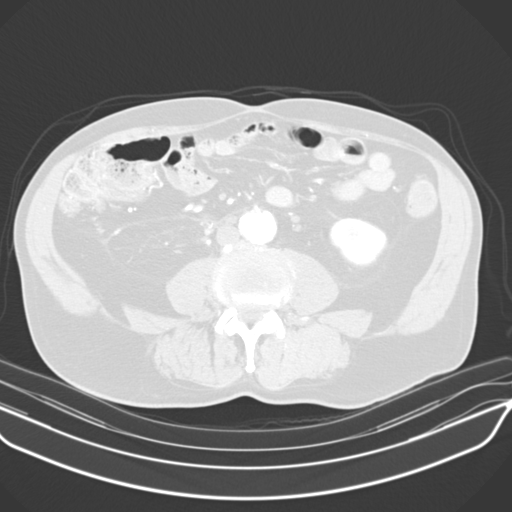
[im 101/151  soft-tissue]
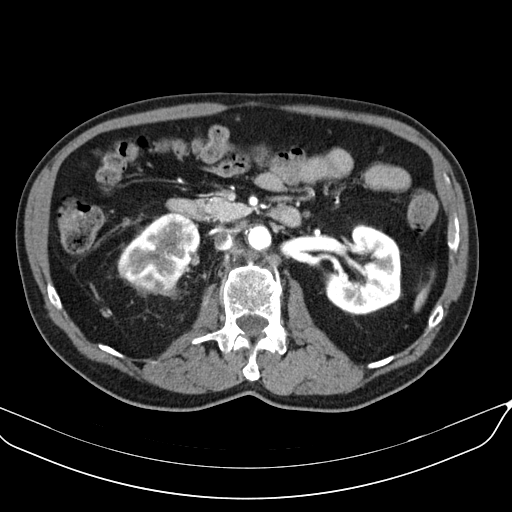
[im 101/151  lung]
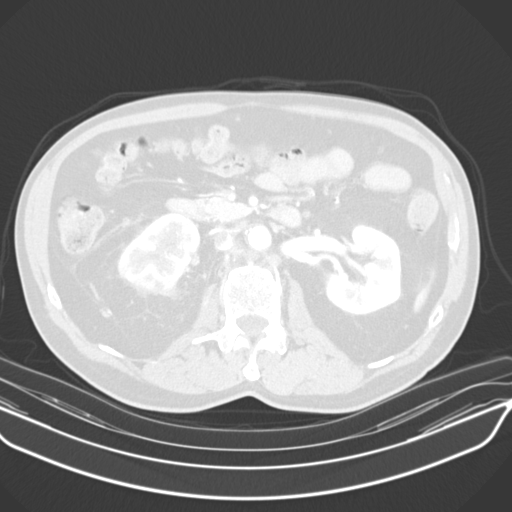
[im 117/151  soft-tissue]
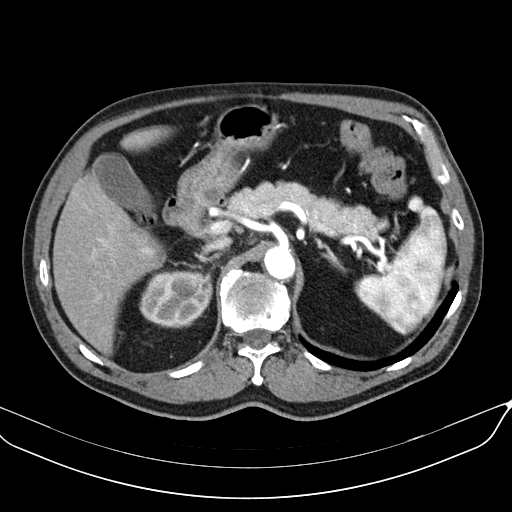
[im 117/151  lung]
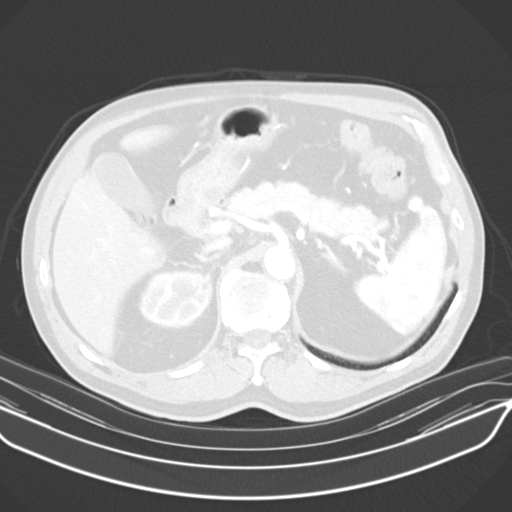
[im 134/151  soft-tissue]
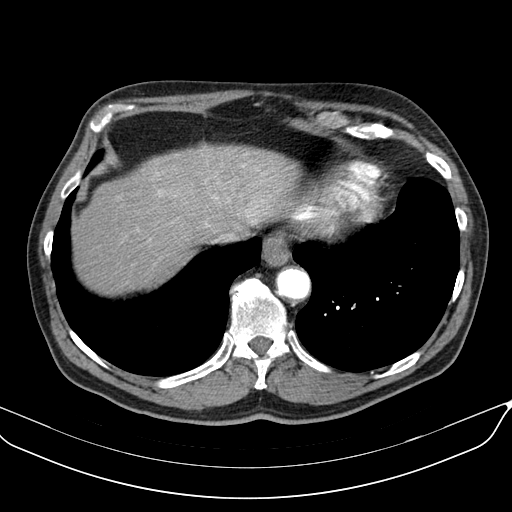
[im 134/151  lung]
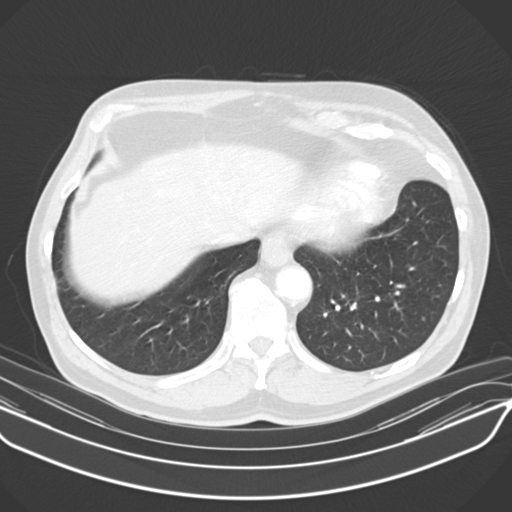

[Series 5: renal nephrographic · axial · 0.75mm/px · z∈[-864,-813]mm · 2 of 151 slices shown]
[im 17/151  soft-tissue]
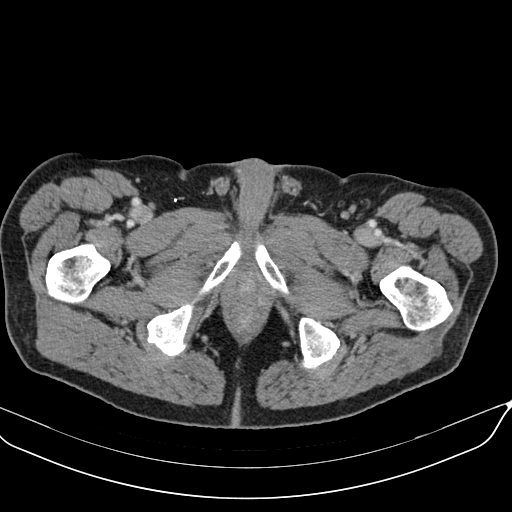
[im 34/151  soft-tissue]
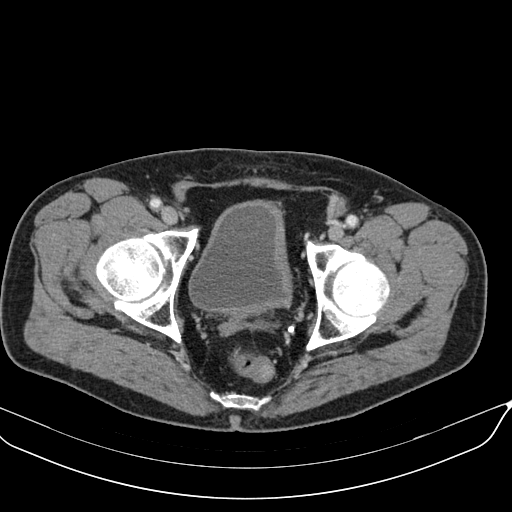

[Series 602: pre coronal · coronal · non-contrast · 0.85mm/px · 2 of 111 slices shown, 3 images]
[im 37/111  soft-tissue]
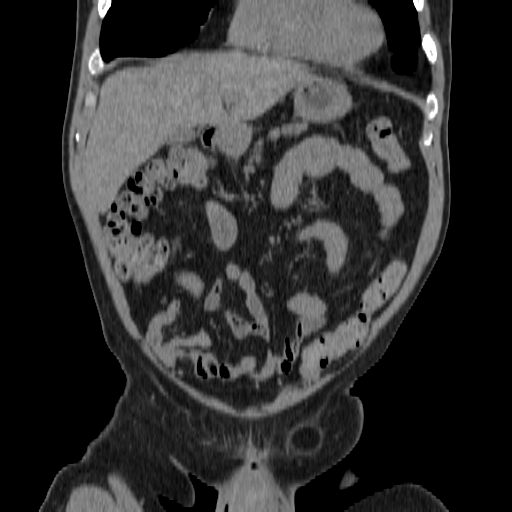
[im 37/111  bone]
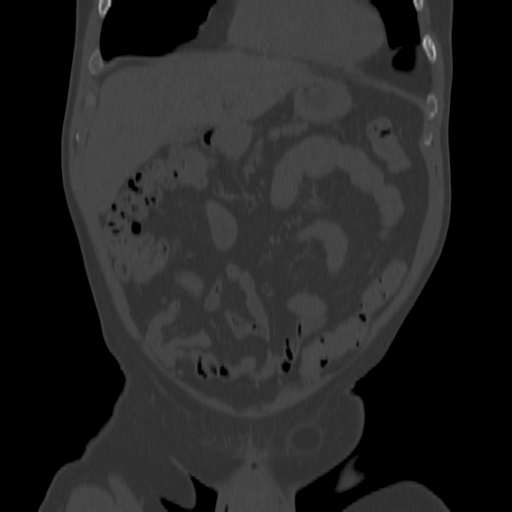
[im 74/111  soft-tissue]
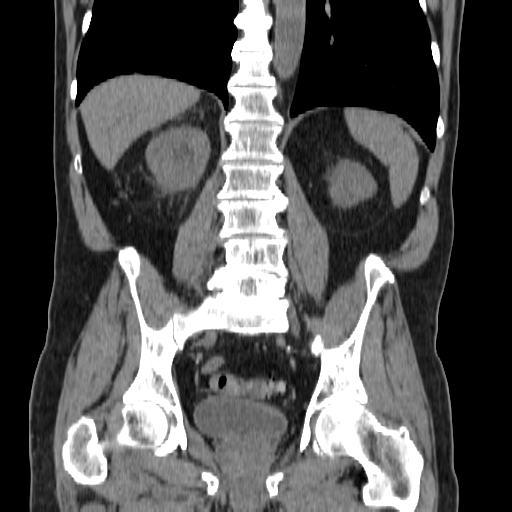

[12 of 46 positions shown; findings below may reference images not displayed]

FINDINGS: Lower Chest: No acute findings.

Hepatobiliary:  No masses identified. Gallbladder is unremarkable.

Pancreas:  No mass or inflammatory changes.

Spleen: Within normal limits in size and appearance.

Adrenals/Urinary Tract: Normal adrenal glands and right kidney.
Ill-defined hypoenhancing mass is seen in the interpolar region of
the right kidney with extension into the renal sinus. This measures
3.8 x 4.6 cm on image 43/3, compared to 3.1 x 3.5 cm previously.
Increased dilatation of the upper pole collecting system is seen.
This does not have typical appearance for pyelonephritis or renal
cell carcinoma, and raises suspicion for urothelial carcinoma. Mild
diffuse bladder wall thickening again seen, without evidence of
focal bladder mass.

Stomach/Bowel: Sigmoid diverticulosis again seen, without evidence
of diverticulitis.

Vascular/Lymphatic: No pathologically enlarged lymph nodes. Stable
3.0 cm infrarenal abdominal aortic aneurysm. Retroaortic left renal
vein incidentally noted.

Reproductive:  Stable mildly enlarged prostate.

Other:  Stable small left inguinal hernia containing only fat.

Musculoskeletal:  No suspicious bone lesions identified.
IMPRESSION: Increased size of 4.6 cm ill-defined hypoenhancing mass in
interpolar region of right kidney, with obstruction of upper pole
collecting system. This does not have typical appearance for
pyelonephritis or renal cell carcinoma, and raises suspicion for
urothelial carcinoma. Consider ureteroscopy for further evaluation.

No evidence of metastatic disease.

Stable mildly enlarged prostate and mild diffuse bladder wall
thickening.

Stable 3.0 cm infrarenal abdominal aortic aneurysm. Recommend
followup by ultrasound in 3 years. This recommendation follows ACR
consensus guidelines: White Paper of the ACR Incidental Findings
Committee II on Vascular Findings. [HOSPITAL] 3034;
[DATE].

Colonic diverticulosis. No radiographic evidence of diverticulitis.

Stable small fat-containing left inguinal hernia.
